# Patient Record
Sex: Female | Born: 1935
Health system: Southern US, Community
[De-identification: ages and names within clinical notes are randomized; demographics above are authoritative.]

## PROBLEM LIST (undated history)

## (undated) DIAGNOSIS — H269 Unspecified cataract: Secondary | ICD-10-CM

## (undated) DIAGNOSIS — I10 Essential (primary) hypertension: Secondary | ICD-10-CM

## (undated) DIAGNOSIS — M19019 Primary osteoarthritis, unspecified shoulder: Secondary | ICD-10-CM

## (undated) DIAGNOSIS — H409 Unspecified glaucoma: Secondary | ICD-10-CM

## (undated) DIAGNOSIS — M62838 Other muscle spasm: Secondary | ICD-10-CM

## (undated) DIAGNOSIS — E7800 Pure hypercholesterolemia, unspecified: Secondary | ICD-10-CM

## (undated) DIAGNOSIS — N289 Disorder of kidney and ureter, unspecified: Secondary | ICD-10-CM

## (undated) DIAGNOSIS — C50919 Malignant neoplasm of unspecified site of unspecified female breast: Secondary | ICD-10-CM

## (undated) DIAGNOSIS — I2510 Atherosclerotic heart disease of native coronary artery without angina pectoris: Secondary | ICD-10-CM

## (undated) DIAGNOSIS — Z720 Tobacco use: Secondary | ICD-10-CM

## (undated) HISTORY — PX: TOTAL ABDOMINAL HYSTERECTOMY: SHX209

## (undated) HISTORY — DX: Other muscle spasm: M62.838

## (undated) HISTORY — PX: CAROTID ENDARTERECTOMY: SUR193

## (undated) HISTORY — DX: Unspecified glaucoma: H40.9

## (undated) HISTORY — DX: Disorder of kidney and ureter, unspecified: N28.9

## (undated) HISTORY — DX: Primary osteoarthritis, unspecified shoulder: M19.019

## (undated) HISTORY — DX: Unspecified cataract: H26.9

## (undated) HISTORY — DX: Tobacco use: Z72.0

## (undated) HISTORY — DX: Essential (primary) hypertension: I10

## (undated) HISTORY — DX: Atherosclerotic heart disease of native coronary artery without angina pectoris: I25.10

## (undated) HISTORY — DX: Pure hypercholesterolemia, unspecified: E78.00

## (undated) HISTORY — DX: Malignant neoplasm of unspecified site of unspecified female breast: C50.919

## (undated) SURGERY — EXCISION MASS
Anesthesia: General | Laterality: Right

---

## 2000-11-29 DIAGNOSIS — C50919 Malignant neoplasm of unspecified site of unspecified female breast: Secondary | ICD-10-CM

## 2000-11-29 HISTORY — PX: MASTECTOMY, RADICAL: SHX710

## 2000-11-29 HISTORY — DX: Malignant neoplasm of unspecified site of unspecified female breast: C50.919

## 2004-11-29 HISTORY — PX: CORONARY ARTERY BYPASS GRAFT: SHX141

## 2010-09-19 ENCOUNTER — Ambulatory Visit: Payer: Self-pay | Admitting: Internal Medicine

## 2012-04-20 DIAGNOSIS — E7800 Pure hypercholesterolemia, unspecified: Secondary | ICD-10-CM | POA: Insufficient documentation

## 2012-05-08 ENCOUNTER — Ambulatory Visit: Payer: Self-pay

## 2012-05-11 ENCOUNTER — Ambulatory Visit: Payer: Self-pay

## 2013-05-15 ENCOUNTER — Ambulatory Visit: Payer: Self-pay | Admitting: Internal Medicine

## 2013-05-15 LAB — HM MAMMOGRAPHY: HM Mammogram: NORMAL

## 2013-07-24 ENCOUNTER — Ambulatory Visit: Payer: Self-pay | Admitting: Internal Medicine

## 2013-07-24 LAB — LIPID PANEL
Cholesterol: 261 mg/dL — AB (ref 0–200)
HDL: 44 mg/dL (ref 35–70)
LDL Cholesterol: 179 mg/dL
Triglycerides: 192 mg/dL — AB (ref 40–160)

## 2014-07-25 ENCOUNTER — Ambulatory Visit: Payer: Self-pay | Admitting: Internal Medicine

## 2014-07-25 LAB — TSH: TSH: 4.1 u[IU]/mL (ref <=5.90)

## 2015-02-04 DIAGNOSIS — Z961 Presence of intraocular lens: Secondary | ICD-10-CM | POA: Diagnosis not present

## 2015-02-04 DIAGNOSIS — H4011X2 Primary open-angle glaucoma, moderate stage: Secondary | ICD-10-CM | POA: Diagnosis not present

## 2015-03-13 DIAGNOSIS — Z720 Tobacco use: Secondary | ICD-10-CM | POA: Diagnosis not present

## 2015-03-13 DIAGNOSIS — N183 Chronic kidney disease, stage 3 (moderate): Secondary | ICD-10-CM | POA: Diagnosis not present

## 2015-03-13 DIAGNOSIS — I25119 Atherosclerotic heart disease of native coronary artery with unspecified angina pectoris: Secondary | ICD-10-CM | POA: Diagnosis not present

## 2015-03-13 DIAGNOSIS — I10 Essential (primary) hypertension: Secondary | ICD-10-CM | POA: Diagnosis not present

## 2015-03-20 DIAGNOSIS — I10 Essential (primary) hypertension: Secondary | ICD-10-CM | POA: Diagnosis not present

## 2015-03-20 DIAGNOSIS — I25110 Atherosclerotic heart disease of native coronary artery with unstable angina pectoris: Secondary | ICD-10-CM | POA: Diagnosis not present

## 2015-05-08 ENCOUNTER — Other Ambulatory Visit: Payer: Self-pay | Admitting: Internal Medicine

## 2015-05-09 ENCOUNTER — Encounter: Payer: Self-pay | Admitting: Internal Medicine

## 2015-05-09 ENCOUNTER — Other Ambulatory Visit: Payer: Self-pay | Admitting: Internal Medicine

## 2015-05-09 DIAGNOSIS — M19019 Primary osteoarthritis, unspecified shoulder: Secondary | ICD-10-CM | POA: Insufficient documentation

## 2015-05-09 DIAGNOSIS — Z853 Personal history of malignant neoplasm of breast: Secondary | ICD-10-CM | POA: Insufficient documentation

## 2015-05-09 DIAGNOSIS — M62838 Other muscle spasm: Secondary | ICD-10-CM | POA: Insufficient documentation

## 2015-05-09 DIAGNOSIS — N289 Disorder of kidney and ureter, unspecified: Secondary | ICD-10-CM | POA: Insufficient documentation

## 2015-05-09 DIAGNOSIS — I10 Essential (primary) hypertension: Secondary | ICD-10-CM | POA: Insufficient documentation

## 2015-05-09 DIAGNOSIS — I2510 Atherosclerotic heart disease of native coronary artery without angina pectoris: Secondary | ICD-10-CM | POA: Insufficient documentation

## 2015-05-09 DIAGNOSIS — Z720 Tobacco use: Secondary | ICD-10-CM | POA: Insufficient documentation

## 2015-06-10 DIAGNOSIS — H4011X2 Primary open-angle glaucoma, moderate stage: Secondary | ICD-10-CM | POA: Diagnosis not present

## 2015-07-29 ENCOUNTER — Encounter: Payer: Self-pay | Admitting: Internal Medicine

## 2015-07-31 ENCOUNTER — Ambulatory Visit (INDEPENDENT_AMBULATORY_CARE_PROVIDER_SITE_OTHER): Payer: Medicare Other | Admitting: Internal Medicine

## 2015-07-31 ENCOUNTER — Encounter: Payer: Self-pay | Admitting: Surgery

## 2015-07-31 ENCOUNTER — Encounter: Payer: Self-pay | Admitting: Internal Medicine

## 2015-07-31 VITALS — BP 152/64 | HR 60 | Ht 60.0 in | Wt 162.2 lb

## 2015-07-31 DIAGNOSIS — N289 Disorder of kidney and ureter, unspecified: Secondary | ICD-10-CM

## 2015-07-31 DIAGNOSIS — E7800 Pure hypercholesterolemia, unspecified: Secondary | ICD-10-CM

## 2015-07-31 DIAGNOSIS — R222 Localized swelling, mass and lump, trunk: Secondary | ICD-10-CM

## 2015-07-31 DIAGNOSIS — I2510 Atherosclerotic heart disease of native coronary artery without angina pectoris: Secondary | ICD-10-CM

## 2015-07-31 DIAGNOSIS — Z853 Personal history of malignant neoplasm of breast: Secondary | ICD-10-CM | POA: Diagnosis not present

## 2015-07-31 DIAGNOSIS — I10 Essential (primary) hypertension: Secondary | ICD-10-CM

## 2015-07-31 DIAGNOSIS — Z0000 Encounter for general adult medical examination without abnormal findings: Secondary | ICD-10-CM | POA: Diagnosis not present

## 2015-07-31 LAB — POC URINALYSIS WITH MICROSCOPIC (NON AUTO)MANUAL RESULT
BILIRUBIN UA: NEGATIVE
Crystals: 0
Epithelial cells, urine per micros: 5
GLUCOSE UA: NEGATIVE
Ketones, UA: NEGATIVE
Mucus, UA: 0
NITRITE UA: NEGATIVE
Protein, UA: NEGATIVE
RBC UA: NEGATIVE
RBC: 0 M/uL — AB (ref 4.04–5.48)
SPEC GRAV UA: 1.01
UROBILINOGEN UA: 0.2
WBC Casts, UA: 0
pH, UA: 5

## 2015-07-31 NOTE — Patient Instructions (Signed)
Health Maintenance  Topic Date Due  . TETANUS/TDAP  01/16/1946  . COLONOSCOPY  01/16/1977  . ZOSTAVAX  01/16/1987  . DEXA SCAN  01/17/1992  . PNA vac Low Risk Adult (2 of 2 - PCV13) 11/30/2005  . INFLUENZA VACCINE  06/30/2015

## 2015-07-31 NOTE — Progress Notes (Signed)
Patient: Donna Santos, Female    DOB: 24-Jan-1925, 81 y.o.   MRN: [id number] Visit Date: 07/31/2015  Today's Provider: Halina Maidens, MD   Chief Complaint  Patient presents with  . Medicare Wellness  . Hypertension  . Hyperlipidemia  . Chronic Kidney Disease   Subjective:    Annual wellness visit Donna Santos is a 81 y.o. female who presents today for her Subsequent Annual Wellness Visit. She feels well. She reports exercising rarely. She reports she is sleeping fairly well.   ----------------------------------------------------------- Hypertension This is a chronic problem. The current episode started more than 1 year ago. The problem is unchanged. The problem is controlled. Pertinent negatives include no anxiety, chest pain, headaches, neck pain, palpitations (gerd) or shortness of breath. There are no associated agents to hypertension. Risk factors for coronary artery disease include dyslipidemia. Hypertensive end-organ damage includes angina, kidney disease and CAD/MI. There is no history of CVA or PVD.   Coronary artery disease - patient reports no chest pains or shortness of breath. She continues on daily aspirin. She remains off of statins due to reluctance for treatment.  Renal insufficiency-patient has mild decrease in renal function. This has been stable. She denies shortness of breath significant lower extremity edema or orthopnea.  Hyperlipidemia patient's had elevated lipids but has not been treated with statin therapy. She continues to decline therapy at this time.  History of breast cancer-patient had right mastectomy in 2002. She reports finding a mass on the right chest wall, she is uncertain how long it has been there and it seems to be changing. It's mildly uncomfortable. Her last mammogram was in 2014 and was normal.  Reflux-patient reports reflux symptoms if she drinks too much coffee. Otherwise she denies heartburn daily trouble swallowing, change  in bowel habits, or blood in stools. She does not take any reflux medications. She's been encouraged to drink more water or juices.  Review of Systems  Constitutional: Negative for fever and appetite change.  HENT: Negative for hearing loss, nosebleeds, sinus pressure, trouble swallowing and voice change.   Eyes: Negative for visual disturbance.  Respiratory: Negative for cough, chest tightness and shortness of breath.   Cardiovascular: Negative for chest pain and palpitations (gerd).  Gastrointestinal: Positive for abdominal pain. Negative for diarrhea, constipation and blood in stool.  Genitourinary: Positive for decreased urine volume. Negative for dysuria, urgency, hematuria and pelvic pain.  Musculoskeletal: Negative for arthralgias, gait problem and neck pain.  Allergic/Immunologic: Negative for food allergies.  Neurological: Negative for dizziness and headaches.  Psychiatric/Behavioral: Negative for decreased concentration.    Social History   Social History  . Marital Status: Widowed    Spouse Name: N/A  . Number of Children: N/A  . Years of Education: N/A   Occupational History  . Not on file.   Social History Main Topics  . Smoking status: Former Research scientist (life sciences)  . Smokeless tobacco: Not on file  . Alcohol Use: No  . Drug Use: Not on file  . Sexual Activity: Not on file   Other Topics Concern  . Not on file   Social History Narrative    Patient Active Problem List   Diagnosis Date Noted  . Impaired renal function 05/09/2015  . Arteriosclerosis of coronary artery 05/09/2015  . Essential (primary) hypertension 05/09/2015  . Personal history of malignant neoplasm of breast 05/09/2015  . Muscle spasms of neck 05/09/2015  . Arthritis of shoulder region, degenerative 05/09/2015  . Current tobacco use 05/09/2015  . Hypercholesteremia 04/20/2012  Past Surgical History  Procedure Laterality Date  . Coronary artery bypass graft  2006  . Mastectomy, radical Right 2002    . Total abdominal hysterectomy    . Carotid endarterectomy      Her family history is not on file.    Previous Medications   AMLODIPINE (NORVASC) 5 MG TABLET    Take 1 tablet by mouth daily.   ASPIRIN 81 MG TABLET    Take 1 tablet by mouth daily.   CHLORTHALIDONE (HYGROTON) 25 MG TABLET    Take 1 tablet by mouth daily.   LATANOPROST (XALATAN) 0.005 % OPHTHALMIC SOLUTION    INT 1 GTT INTO EACH EYE QHS.   LOSARTAN (COZAAR) 25 MG TABLET    TAKE 1 TABLET BY MOUTH EVERY DAY   METOPROLOL (LOPRESSOR) 100 MG TABLET    Take 1 tablet by mouth 2 (two) times daily.   NITROGLYCERIN (NITROSTAT) 0.4 MG SL TABLET    Frequency:PHARMDIR   Dosage:0.0     Instructions:  Note:Q5 MIN  prn, may repeat x 3  prn; disp 1 bottle Dose: 1 TAB   TIMOLOL (TIMOPTIC) 0.25 % OPHTHALMIC SOLUTION    INSTILL ONE DROP INTO BOTH EYES EVERY MORNING.    Patient Care Team: Glean Hess, MD as PCP - General (Family Medicine)     Objective:   Vitals: BP 152/64 mmHg  Pulse 60  Ht 5' (1.524 m)  Wt 162 lb 3.2 oz (73.573 kg)  BMI 31.68 kg/m2  Physical Exam  Constitutional: She is oriented to person, place, and time. She appears well-developed. No distress.  HENT:  Head: Normocephalic and atraumatic.  Eyes: Conjunctivae are normal. Right eye exhibits no discharge. Left eye exhibits no discharge. No scleral icterus.  Neck: No tracheal tenderness present. Carotid bruit is not present. No thyromegaly present.  Cardiovascular: Normal rate, regular rhythm and S1 normal.  Exam reveals decreased pulses.   Pulmonary/Chest: Effort normal and breath sounds normal. No respiratory distress. Left breast exhibits no mass, no nipple discharge, no skin change and no tenderness.    Abdominal: Soft. Normal appearance and bowel sounds are normal. There is no hepatosplenomegaly. There is no tenderness. There is no CVA tenderness.  Musculoskeletal: Normal range of motion.       Right knee: She exhibits effusion.       Left knee: She  exhibits effusion.  Lymphadenopathy:    She has no cervical adenopathy.    She has no axillary adenopathy.  Neurological: She is alert and oriented to person, place, and time. She has normal reflexes.  Skin: Skin is warm, dry and intact. No rash noted.  Psychiatric: She has a normal mood and affect. Her behavior is normal. Judgment and thought content normal. Cognition and memory are normal.    Activities of Daily Living In your present state of health, do you have any difficulty performing the following activities: 07/31/2015  Hearing? N  Vision? N  Difficulty concentrating or making decisions? Y  Walking or climbing stairs? N  Dressing or bathing? N  Doing errands, shopping? N    Fall Risk Assessment Fall Risk  07/31/2015  Falls in the past year? No     Patient reports there are safety devices in place in shower at home.   Depression Screen PHQ 2/9 Scores 07/31/2015  PHQ - 2 Score 0    Cognitive Testing - 6-CIT   Correct? Score   What year is it? yes 0 Yes = 0    No =  4  What month is it? yes 0 Yes = 0    No = 3  Remember:     Pia Mau, [place], [place]     What time is it? yes 0 Yes = 0    No = 3  Count backwards from 20 to 1 yes 0 Correct = 0    1 error = 2   More than 1 error = 4  Say the months of the year in reverse. yes 0 Correct = 0    1 error = 2   More than 1 error = 4  What address did I ask you to remember? yes 0 Correct = 0  1 error = 2    2 error = 4    3 error = 6    4 error = 8    All wrong = 10       TOTAL SCORE  0/28   Interpretation:  Normal  Normal (0-7) Abnormal (8-28)        Assessment & Plan:     Annual Wellness Visit  Reviewed patient's Family Medical History Reviewed and updated list of patient's medical providers Assessment of cognitive impairment was done Assessed patient's functional ability Established a written schedule for health screening Mar-Mac Completed and Reviewed  Exercise Activities and  Dietary recommendations Goals    None      Immunization History  Administered Date(s) Administered  . Pneumococcal Polysaccharide-23 11/30/2004    Health Maintenance  Topic Date Due  . TETANUS/TDAP  01/16/1946  . COLONOSCOPY  01/16/1977  . ZOSTAVAX  01/16/1987  . DEXA SCAN  01/17/1992  . PNA vac Low Risk Adult (2 of 2 - PCV13) 11/30/2005  . INFLUENZA VACCINE  06/30/2015     Discussed health benefits of physical activity, and encouraged her to engage in regular exercise appropriate for her age and condition.    ------------------------------------------------------------------------------------------------------------   1. Annual physical exam Patient is up-to-date on immunizations. She continues to decline colonoscopy. Medicare wellness measures satisfied today.  2. Essential (primary) hypertension Well-controlled on current medications. - CBC with Differential/Platelet - POCT urinalysis dipstick  3. Impaired renal function We'll check labs today. - Comprehensive metabolic panel  4. Hypercholesteremia Will not obtain lipid panel as patient declines statin therapy. - TSH  5. Personal history of malignant neoplasm of breast Refer for unilateral left mammogram and chest x-ray. - MM Digital Screening Unilat L; Future - DG Chest 2 View; Future  6. Chest wall mass Refer to general surgery for evaluation and possible excision of chest wall mass. - Ambulatory referral to General Surgery  7. Atherosclerosis of coronary artery Continue aspirin daily. Follow-up with cardiology as needed.  Halina Maidens, MD [facility]  07/31/2015

## 2015-08-01 LAB — CBC WITH DIFFERENTIAL/PLATELET
BASOS ABS: 0.1 10*3/uL (ref 0.0–0.2)
BASOS: 1 %
EOS (ABSOLUTE): 0.2 10*3/uL (ref 0.0–0.4)
Eos: 3 %
HEMOGLOBIN: 14.2 g/dL (ref 11.1–15.9)
Hematocrit: 42.6 % (ref 34.0–46.6)
IMMATURE GRANS (ABS): 0 10*3/uL (ref 0.0–0.1)
IMMATURE GRANULOCYTES: 0 %
LYMPHS: 20 %
Lymphocytes Absolute: 1.6 10*3/uL (ref 0.7–3.1)
MCH: 31.3 pg (ref 26.6–33.0)
MCHC: 33.3 g/dL (ref 31.5–35.7)
MCV: 94 fL (ref 79–97)
MONOCYTES: 8 %
Monocytes Absolute: 0.7 10*3/uL (ref 0.1–0.9)
NEUTROS ABS: 5.4 10*3/uL (ref 1.4–7.0)
Neutrophils: 68 %
Platelets: 403 10*3/uL — ABNORMAL HIGH (ref 150–379)
RBC: 4.54 x10E6/uL (ref 3.77–5.28)
RDW: 13.7 % (ref 12.3–15.4)
WBC: 8.1 10*3/uL (ref 3.4–10.8)

## 2015-08-01 LAB — COMPREHENSIVE METABOLIC PANEL
ALBUMIN: 4.5 g/dL (ref 3.5–4.7)
ALT: 12 IU/L (ref 0–32)
AST: 15 IU/L (ref 0–40)
Albumin/Globulin Ratio: 1.6 (ref 1.1–2.5)
Alkaline Phosphatase: 44 IU/L (ref 39–117)
BILIRUBIN TOTAL: 0.4 mg/dL (ref 0.0–1.2)
BUN / CREAT RATIO: 16 (ref 11–26)
BUN: 18 mg/dL (ref 8–27)
CALCIUM: 9.5 mg/dL (ref 8.7–10.3)
CHLORIDE: 96 mmol/L — AB (ref 97–108)
CO2: 22 mmol/L (ref 18–29)
Creatinine, Ser: 1.11 mg/dL — ABNORMAL HIGH (ref 0.57–1.00)
GFR, EST AFRICAN AMERICAN: 51 mL/min/{1.73_m2} — AB (ref >59)
GFR, EST NON AFRICAN AMERICAN: 44 mL/min/{1.73_m2} — AB (ref >59)
GLUCOSE: 124 mg/dL — AB (ref 65–99)
Globulin, Total: 2.9 g/dL (ref 1.5–4.5)
Potassium: 4.2 mmol/L (ref 3.5–5.2)
Sodium: 138 mmol/L (ref 134–144)
TOTAL PROTEIN: 7.4 g/dL (ref 6.0–8.5)

## 2015-08-01 LAB — TSH: TSH: 2.41 u[IU]/mL (ref 0.450–4.500)

## 2015-08-05 ENCOUNTER — Other Ambulatory Visit: Payer: Self-pay | Admitting: Internal Medicine

## 2015-08-08 ENCOUNTER — Encounter: Payer: Self-pay | Admitting: *Deleted

## 2015-08-11 ENCOUNTER — Ambulatory Visit (INDEPENDENT_AMBULATORY_CARE_PROVIDER_SITE_OTHER): Payer: Medicare Other | Admitting: Surgery

## 2015-08-11 ENCOUNTER — Encounter (INDEPENDENT_AMBULATORY_CARE_PROVIDER_SITE_OTHER): Payer: Self-pay

## 2015-08-11 ENCOUNTER — Encounter: Payer: Self-pay | Admitting: Surgery

## 2015-08-11 VITALS — BP 160/80 | HR 70 | Temp 97.8°F | Ht 60.0 in | Wt 160.0 lb

## 2015-08-11 DIAGNOSIS — R222 Localized swelling, mass and lump, trunk: Secondary | ICD-10-CM | POA: Diagnosis not present

## 2015-08-11 NOTE — Progress Notes (Signed)
  Surgical Consultation  08/11/2015  Donna Santos is an 81 y.o. female.   CC: Right chest wall mass  HPI: This patient with a personal history of breast cancer is had a mastectomy on the right presents with a chest wall mass which has been enlarging and causing her some pain but it has been present for many years unclear as to how many years denies weight loss fevers or chills.  Past Medical History  Diagnosis Date  . Impaired renal function   . Arteriosclerosis of coronary artery   . Essential hypertension   . Malignant neoplasm of breast   . Muscle spasms of neck   . Arthritis of shoulder region   . Tobacco abuse   . Hypercholesteremia     Past Surgical History  Procedure Laterality Date  . Coronary artery bypass graft  2006  . Mastectomy, radical Right 2002  . Total abdominal hysterectomy    . Carotid endarterectomy      Family History  Problem Relation Age of Onset  . Hypertension Father   . Cancer Father     Social History:  reports that she has quit smoking. She has never used smokeless tobacco. She reports that she does not drink alcohol or use illicit drugs.  Allergies:  Allergies  Allergen Reactions  . Ace Inhibitors     Other reaction(s): Angioedema    Medications reviewed.   Review of Systems:   Review of Systems  Constitutional: Negative for fever, chills and weight loss.  HENT: Negative.   Eyes: Negative.   Respiratory: Negative.   Cardiovascular: Negative.   Gastrointestinal: Negative.   Genitourinary: Negative.   Musculoskeletal: Negative.   Skin: Negative.   Neurological: Negative.  Negative for weakness.  Endo/Heme/Allergies: Negative.   Psychiatric/Behavioral: Negative.      Physical Exam:  BP 160/80 mmHg  Pulse 70  Temp(Src) 97.8 F (36.6 C) (Oral)  Ht 5' (1.524 m)  Wt 160 lb (72.576 kg)  BMI 31.25 kg/m2  Physical Exam  Constitutional: She is well-developed, well-nourished, and in no distress. No distress.    HENT:  Head: Normocephalic and atraumatic.  Eyes: No scleral icterus.  Neck: Normal range of motion. Neck supple.  Cardiovascular: Normal rate, regular rhythm and normal heart sounds.   Pulmonary/Chest: Effort normal and breath sounds normal. No respiratory distress. She has no wheezes.    Median sternotomy incision is healed. Right MRM site is well healed. There is a 2-1/2-3 cm mobile mass in the subcutaneous tissue not fixed to the chest wall in the medial portion of the mastectomy site. No overlying erythema or drainage.  Abdominal: Soft. She exhibits no distension. There is no tenderness.  Lymphadenopathy:    She has no cervical adenopathy.  Skin: Skin is warm and dry.      No results found for this or any previous visit (from the past 48 hour(s)). No results found.  Assessment/Plan:  Long-standing right chest wall mass that seems to be in the subcutaneous tissues tissues does not seem to be fixed but is is in the mastectomy site is been present for many years. I recommend CT scan of the chest and chest wall at this point sitter excisional biopsy in the near future.  Florene Glen, MD, FACS

## 2015-08-11 NOTE — Patient Instructions (Signed)
Have CT scan of chest performed and follow-up with Dr. Burt Knack in 10-14 days

## 2015-08-13 ENCOUNTER — Other Ambulatory Visit: Payer: Self-pay | Admitting: *Deleted

## 2015-08-13 DIAGNOSIS — R222 Localized swelling, mass and lump, trunk: Secondary | ICD-10-CM

## 2015-08-14 ENCOUNTER — Telehealth: Payer: Self-pay

## 2015-08-14 NOTE — Telephone Encounter (Signed)
Melanie from [facility] called in reference to patient's CT order. She stated that patient had an order for CT Chest with and without contrast. However, she needs patient's CT to be changed to CT Chest with contrast. Can you please call patient's insurance and obtain a new authorization. Thank you.   Threasa Beards would like for you to call her back once you had obtain authorization at [phone]. Thanks.

## 2015-08-15 ENCOUNTER — Ambulatory Visit
Admission: RE | Admit: 2015-08-15 | Discharge: 2015-08-15 | Disposition: A | Discharge status: Home / Self Care | Payer: Medicare Other | Source: Ambulatory Visit | Attending: Surgery | Admitting: Surgery

## 2015-08-15 ENCOUNTER — Telehealth: Payer: Self-pay

## 2015-08-15 DIAGNOSIS — R222 Localized swelling, mass and lump, trunk: Secondary | ICD-10-CM

## 2015-08-15 NOTE — Telephone Encounter (Signed)
A new authorization has been obtained for CPT: 22025. AUTH# [id number]. I have contacted the pre-cert department with this updated information.

## 2015-08-15 NOTE — Telephone Encounter (Signed)
Heather from [place] at [facility] called at this time. Explains that multiple people have tried to obtain IV access for contrast and they cannot put line in.  Spoke with Dr. Azalee Course regarding this situation and she would like patient to be rescheduled for CT at [facility] so that their are other options for who may place line if their is difficulty and also to have patient drink as much as possible the day of CT to be as hydrated as possible as this scan definitely needs to be done with contrast.  Explained this to [facility] and she will take care of giving patient the instructions and getting her rescheduled.

## 2015-08-18 ENCOUNTER — Telehealth: Payer: Self-pay | Admitting: Surgery

## 2015-08-18 NOTE — Telephone Encounter (Signed)
Called patient to let her know that Nira Conn from [place] [place] Department was going to contact her in reference to rescheduling her CT Scan. I also told patient that Dr. Azalee Course really wanted her to have this done. Patient understood.

## 2015-08-18 NOTE — Telephone Encounter (Signed)
Patient has an appointment to get results on a CAT Scan but she didn't have it because they couldn't find a vein. She would like to know is it really necessary that she have this?

## 2015-08-19 ENCOUNTER — Other Ambulatory Visit: Payer: Self-pay

## 2015-08-19 DIAGNOSIS — R222 Localized swelling, mass and lump, trunk: Secondary | ICD-10-CM

## 2015-08-21 ENCOUNTER — Ambulatory Visit
Admission: RE | Admit: 2015-08-21 | Discharge: 2015-08-21 | Disposition: A | Discharge status: Home / Self Care | Payer: Medicare Other | Source: Ambulatory Visit | Attending: Surgery | Admitting: Surgery

## 2015-08-21 DIAGNOSIS — R222 Localized swelling, mass and lump, trunk: Secondary | ICD-10-CM | POA: Diagnosis present

## 2015-08-21 DIAGNOSIS — R918 Other nonspecific abnormal finding of lung field: Secondary | ICD-10-CM | POA: Insufficient documentation

## 2015-08-21 DIAGNOSIS — I517 Cardiomegaly: Secondary | ICD-10-CM | POA: Insufficient documentation

## 2015-08-21 DIAGNOSIS — K449 Diaphragmatic hernia without obstruction or gangrene: Secondary | ICD-10-CM | POA: Insufficient documentation

## 2015-08-21 DIAGNOSIS — R911 Solitary pulmonary nodule: Secondary | ICD-10-CM | POA: Diagnosis not present

## 2015-08-21 DIAGNOSIS — N281 Cyst of kidney, acquired: Secondary | ICD-10-CM | POA: Insufficient documentation

## 2015-08-21 LAB — POCT I-STAT CREATININE: CREATININE: 1 mg/dL (ref 0.44–1.00)

## 2015-08-21 MED ORDER — IOHEXOL 300 MG/ML  SOLN
60.0000 mL | Freq: Once | INTRAMUSCULAR | Status: AC | PRN
  Administered 2015-08-21: 60 mL via INTRAVENOUS

## 2015-08-26 ENCOUNTER — Encounter: Payer: Self-pay | Admitting: Surgery

## 2015-08-26 ENCOUNTER — Ambulatory Visit (INDEPENDENT_AMBULATORY_CARE_PROVIDER_SITE_OTHER): Payer: Medicare Other | Admitting: Surgery

## 2015-08-26 VITALS — BP 195/68 | HR 62 | Temp 98.3°F | Ht 60.0 in | Wt 160.0 lb

## 2015-08-26 DIAGNOSIS — R222 Localized swelling, mass and lump, trunk: Secondary | ICD-10-CM | POA: Diagnosis not present

## 2015-08-26 NOTE — Progress Notes (Signed)
Outpatient Surgical Follow Up  08/26/2015  Donna Santos is an 81 y.o. female.   CC right chest wall mass  HPI: Patient has a right chest wall mass which is persistent and occasionally painful and growing in size. She has a personal history of right breast cancer and has had a mastectomy 20 years ago  Past Medical History  Diagnosis Date  . Impaired renal function   . Arteriosclerosis of coronary artery   . Essential hypertension   . Malignant neoplasm of breast   . Muscle spasms of neck   . Arthritis of shoulder region   . Tobacco abuse   . Hypercholesteremia     Past Surgical History  Procedure Laterality Date  . Coronary artery bypass graft  2006  . Mastectomy, radical Right 2002  . Total abdominal hysterectomy    . Carotid endarterectomy      Family History  Problem Relation Age of Onset  . Hypertension Father   . Cancer Father     Social History:  reports that she has quit smoking. She has never used smokeless tobacco. She reports that she does not drink alcohol or use illicit drugs.  Allergies:  Allergies  Allergen Reactions  . Ace Inhibitors     Other reaction(s): Angioedema    Medications reviewed.   Review of Systems:   Review of Systems  Constitutional: Negative.   HENT: Negative.   Eyes: Negative.   Respiratory: Negative.   Cardiovascular: Negative.   Gastrointestinal: Negative.   Genitourinary: Negative.   Musculoskeletal: Negative.   Skin: Negative.   Neurological: Negative.   Endo/Heme/Allergies: Negative.   Psychiatric/Behavioral: Negative.      Physical Exam:  BP 195/68 mmHg  Pulse 62  Temp(Src) 98.3 F (36.8 C) (Oral)  Ht 5' (1.524 m)  Wt 160 lb (72.576 kg)  BMI 31.25 kg/m2  Physical Exam  Constitutional: She is oriented to person, place, and time and well-developed, well-nourished, and in no distress.  HENT:  Head: Normocephalic and atraumatic.  Eyes: No scleral icterus.  Cardiovascular: Normal rate and regular  rhythm.   Pulmonary/Chest: No respiratory distress. She has no wheezes. She has no rales.    Abdominal: Soft. She exhibits no distension. There is no tenderness.  Lymphadenopathy:    She has no cervical adenopathy.  Neurological: She is alert and oriented to person, place, and time.  Skin: Skin is warm and dry.  Psychiatric: Affect normal.      No results found for this or any previous visit (from the past 48 hour(s)). No results found.  Assessment/Plan:  CT scan is personally reviewed and of note there is no mention in the report of the chest wall mass for which she was sent. I note that this is palpable on my exam measuring at approximately 2-1/2 cm medially on the chest wall beneath the mastectomy flap. A CT scan there appears to be a mass in this area somewhat deep to the pectoralis major.  I'm recommending excisional biopsy. I discussed with she and her family the options of observation and the rationale for offering surgery also the risks of bleeding infection recurrence and the need for further therapy including surgery or radiation therapy and cosmetic deformity they understood and agreed to proceed.  I tried to schedule this for 2 days from now on the patient's family has other commitments we will try to schedule it at their convenience yet of this week or next    Florene Glen, MD, FACS

## 2015-08-26 NOTE — Patient Instructions (Signed)
Please give [place] a call once you've decided when you want surgery to be scheduled for.

## 2015-08-27 ENCOUNTER — Telehealth: Payer: Self-pay | Admitting: Surgery

## 2015-08-27 ENCOUNTER — Encounter
Admission: RE | Admit: 2015-08-27 | Discharge: 2015-08-27 | Disposition: A | Discharge status: Home / Self Care | Payer: Medicare Other | Source: Ambulatory Visit | Attending: Surgery | Admitting: Surgery

## 2015-08-27 DIAGNOSIS — M1990 Unspecified osteoarthritis, unspecified site: Secondary | ICD-10-CM | POA: Diagnosis not present

## 2015-08-27 DIAGNOSIS — M62838 Other muscle spasm: Secondary | ICD-10-CM | POA: Diagnosis not present

## 2015-08-27 DIAGNOSIS — E780 Pure hypercholesterolemia: Secondary | ICD-10-CM | POA: Diagnosis not present

## 2015-08-27 DIAGNOSIS — Z809 Family history of malignant neoplasm, unspecified: Secondary | ICD-10-CM | POA: Diagnosis not present

## 2015-08-27 DIAGNOSIS — Z87891 Personal history of nicotine dependence: Secondary | ICD-10-CM | POA: Diagnosis not present

## 2015-08-27 DIAGNOSIS — Z90710 Acquired absence of both cervix and uterus: Secondary | ICD-10-CM | POA: Diagnosis not present

## 2015-08-27 DIAGNOSIS — Z8249 Family history of ischemic heart disease and other diseases of the circulatory system: Secondary | ICD-10-CM | POA: Diagnosis not present

## 2015-08-27 DIAGNOSIS — Z853 Personal history of malignant neoplasm of breast: Secondary | ICD-10-CM | POA: Diagnosis not present

## 2015-08-27 DIAGNOSIS — Z951 Presence of aortocoronary bypass graft: Secondary | ICD-10-CM | POA: Diagnosis not present

## 2015-08-27 DIAGNOSIS — C50911 Malignant neoplasm of unspecified site of right female breast: Secondary | ICD-10-CM | POA: Diagnosis not present

## 2015-08-27 DIAGNOSIS — R222 Localized swelling, mass and lump, trunk: Secondary | ICD-10-CM | POA: Diagnosis present

## 2015-08-27 DIAGNOSIS — Z888 Allergy status to other drugs, medicaments and biological substances status: Secondary | ICD-10-CM | POA: Diagnosis not present

## 2015-08-27 DIAGNOSIS — I2510 Atherosclerotic heart disease of native coronary artery without angina pectoris: Secondary | ICD-10-CM | POA: Diagnosis not present

## 2015-08-27 DIAGNOSIS — Z171 Estrogen receptor negative status [ER-]: Secondary | ICD-10-CM | POA: Diagnosis not present

## 2015-08-27 DIAGNOSIS — N289 Disorder of kidney and ureter, unspecified: Secondary | ICD-10-CM | POA: Diagnosis not present

## 2015-08-27 LAB — CBC WITH DIFFERENTIAL/PLATELET
Basophils Absolute: 0.1 10*3/uL (ref 0–0.1)
Basophils Relative: 1 %
EOS PCT: 2 %
Eosinophils Absolute: 0.2 10*3/uL (ref 0–0.7)
HEMATOCRIT: 43.3 % (ref 35.0–47.0)
Hemoglobin: 14.7 g/dL (ref 12.0–16.0)
LYMPHS PCT: 13 %
Lymphs Abs: 1.4 10*3/uL (ref 1.0–3.6)
MCH: 31.2 pg (ref 26.0–34.0)
MCHC: 34 g/dL (ref 32.0–36.0)
MCV: 91.7 fL (ref 80.0–100.0)
MONO ABS: 1 10*3/uL — AB (ref 0.2–0.9)
MONOS PCT: 9 %
NEUTROS ABS: 8 10*3/uL — AB (ref 1.4–6.5)
Neutrophils Relative %: 75 %
PLATELETS: 357 10*3/uL (ref 150–440)
RBC: 4.72 MIL/uL (ref 3.80–5.20)
RDW: 13.8 % (ref 11.5–14.5)
WBC: 10.6 10*3/uL (ref 3.6–11.0)

## 2015-08-27 LAB — BASIC METABOLIC PANEL
ANION GAP: 11 (ref 5–15)
BUN: 18 mg/dL (ref 6–20)
CO2: 28 mmol/L (ref 22–32)
Calcium: 9.5 mg/dL (ref 8.9–10.3)
Chloride: 95 mmol/L — ABNORMAL LOW (ref 101–111)
Creatinine, Ser: 1.01 mg/dL — ABNORMAL HIGH (ref 0.44–1.00)
GFR calc Af Amer: 56 mL/min — ABNORMAL LOW (ref >60)
GFR calc non Af Amer: 48 mL/min — ABNORMAL LOW (ref >60)
GLUCOSE: 119 mg/dL — AB (ref 65–99)
POTASSIUM: 3.2 mmol/L — AB (ref 3.5–5.1)
Sodium: 134 mmol/L — ABNORMAL LOW (ref 135–145)

## 2015-08-27 NOTE — OR Nursing (Signed)
Notified Angie at Dr. York Cerise office of patients 3.2 potassium

## 2015-08-27 NOTE — Telephone Encounter (Signed)
Pt was advised of her pre op date/time and sx date in the office. Sx: 08/28/15 with Dr Charm Barges Chest wall biopsy Pre op: 08/27/15 @ 12:15--office.

## 2015-08-27 NOTE — Patient Instructions (Signed)
  Your procedure is scheduled on: Thursday 08/28/15 Report to Day Surgery. 2ND FLOOR MEDICAL MALL ENTRANCE AT 7:30   Remember: Instructions that are not followed completely may result in serious medical risk, up to and including death, or upon the discretion of your surgeon and anesthesiologist your surgery may need to be rescheduled.    __X__ 1. Do not eat food or drink liquids after midnight. No gum chewing or hard candies.     __X__ 2. No Alcohol for 24 hours before or after surgery.   ____ 3. Bring all medications with you on the day of surgery if instructed.    __X__ 4. Notify your doctor if there is any change in your medical condition     (cold, fever, infections).     Do not wear jewelry, make-up, hairpins, clips or nail polish.  Do not wear lotions, powders, or perfumes.   Do not shave 48 hours prior to surgery. Men may shave face and neck.  Do not bring valuables to the hospital.    [facility] is not responsible for any belongings or valuables.               Contacts, dentures or bridgework may not be worn into surgery.  Leave your suitcase in the car. After surgery it may be brought to your room.  For patients admitted to the hospital, discharge time is determined by your                treatment team.   Patients discharged the day of surgery will not be allowed to drive home.   Please read over the following fact sheets that you were given:   Surgical Site Infection Prevention   __X__ Take these medicines the morning of surgery with A SIP OF WATER:    1. AMLODIPINE  2. LOSARTAN  3. METOPROLOL  4.  5.  6.  ____ Fleet Enema (as directed)   __X__ Use CHG Soap as directed  ____ Use inhalers on the day of surgery  ____ Stop metformin 2 days prior to surgery    ____ Take 1/2 of usual insulin dose the night before surgery and none on the morning of surgery.   ____ Stop Coumadin/Plavix/aspirin on LAST DOSE 08/26/15  ____ Stop Anti-inflammatories on    ____ Stop  supplements until after surgery.    ____ Bring C-Pap to the hospital.

## 2015-08-28 ENCOUNTER — Ambulatory Visit: Payer: Medicare Other | Admitting: Anesthesiology

## 2015-08-28 ENCOUNTER — Ambulatory Visit
Admission: RE | Admit: 2015-08-28 | Discharge: 2015-08-28 | Disposition: A | Discharge status: Home / Self Care | Payer: Medicare Other | Source: Ambulatory Visit | Attending: Surgery | Admitting: Surgery

## 2015-08-28 ENCOUNTER — Encounter
Admission: RE | Disposition: A | Discharge status: Home / Self Care | Payer: Self-pay | Source: Ambulatory Visit | Attending: Surgery

## 2015-08-28 ENCOUNTER — Encounter: Payer: Self-pay | Admitting: *Deleted

## 2015-08-28 DIAGNOSIS — Z951 Presence of aortocoronary bypass graft: Secondary | ICD-10-CM | POA: Diagnosis not present

## 2015-08-28 DIAGNOSIS — Z853 Personal history of malignant neoplasm of breast: Secondary | ICD-10-CM | POA: Diagnosis not present

## 2015-08-28 DIAGNOSIS — Z87891 Personal history of nicotine dependence: Secondary | ICD-10-CM | POA: Diagnosis not present

## 2015-08-28 DIAGNOSIS — Z171 Estrogen receptor negative status [ER-]: Secondary | ICD-10-CM | POA: Insufficient documentation

## 2015-08-28 DIAGNOSIS — Z809 Family history of malignant neoplasm, unspecified: Secondary | ICD-10-CM | POA: Diagnosis not present

## 2015-08-28 DIAGNOSIS — C50911 Malignant neoplasm of unspecified site of right female breast: Secondary | ICD-10-CM | POA: Diagnosis not present

## 2015-08-28 DIAGNOSIS — C7989 Secondary malignant neoplasm of other specified sites: Secondary | ICD-10-CM | POA: Diagnosis not present

## 2015-08-28 DIAGNOSIS — R222 Localized swelling, mass and lump, trunk: Secondary | ICD-10-CM

## 2015-08-28 DIAGNOSIS — Z8249 Family history of ischemic heart disease and other diseases of the circulatory system: Secondary | ICD-10-CM | POA: Diagnosis not present

## 2015-08-28 DIAGNOSIS — Z888 Allergy status to other drugs, medicaments and biological substances status: Secondary | ICD-10-CM | POA: Insufficient documentation

## 2015-08-28 DIAGNOSIS — E780 Pure hypercholesterolemia: Secondary | ICD-10-CM | POA: Diagnosis not present

## 2015-08-28 DIAGNOSIS — M62838 Other muscle spasm: Secondary | ICD-10-CM | POA: Insufficient documentation

## 2015-08-28 DIAGNOSIS — M1990 Unspecified osteoarthritis, unspecified site: Secondary | ICD-10-CM | POA: Insufficient documentation

## 2015-08-28 DIAGNOSIS — N289 Disorder of kidney and ureter, unspecified: Secondary | ICD-10-CM | POA: Insufficient documentation

## 2015-08-28 DIAGNOSIS — I2510 Atherosclerotic heart disease of native coronary artery without angina pectoris: Secondary | ICD-10-CM | POA: Diagnosis not present

## 2015-08-28 DIAGNOSIS — N63 Unspecified lump in breast: Secondary | ICD-10-CM | POA: Diagnosis not present

## 2015-08-28 DIAGNOSIS — I10 Essential (primary) hypertension: Secondary | ICD-10-CM | POA: Diagnosis not present

## 2015-08-28 DIAGNOSIS — Z90710 Acquired absence of both cervix and uterus: Secondary | ICD-10-CM | POA: Insufficient documentation

## 2015-08-28 HISTORY — PX: MASS EXCISION: SHX2000

## 2015-08-28 LAB — POCT I-STAT 4, (NA,K, GLUC, HGB,HCT)
Glucose, Bld: 131 mg/dL — ABNORMAL HIGH (ref 65–99)
HEMATOCRIT: 43 % (ref 36.0–46.0)
HEMOGLOBIN: 14.6 g/dL (ref 12.0–15.0)
Potassium: 3.6 mmol/L (ref 3.5–5.1)
SODIUM: 137 mmol/L (ref 135–145)

## 2015-08-28 SURGERY — EXCISION MASS
Anesthesia: General | Laterality: Right | Wound class: Clean

## 2015-08-28 MED ORDER — EPHEDRINE SULFATE 50 MG/ML IJ SOLN
INTRAMUSCULAR | Status: DC | PRN
  Administered 2015-08-28: 15 mg via INTRAVENOUS

## 2015-08-28 MED ORDER — HEPARIN SODIUM (PORCINE) 5000 UNIT/ML IJ SOLN
INTRAMUSCULAR | Status: AC
  Filled 2015-08-28: qty 1

## 2015-08-28 MED ORDER — PROPOFOL 10 MG/ML IV BOLUS
INTRAVENOUS | Status: DC | PRN
Start: 2015-08-28 — End: 2015-08-28
  Administered 2015-08-28: 130 mg via INTRAVENOUS

## 2015-08-28 MED ORDER — ONDANSETRON HCL 4 MG/2ML IJ SOLN
INTRAMUSCULAR | Status: DC | PRN
  Administered 2015-08-28: 4 mg via INTRAVENOUS

## 2015-08-28 MED ORDER — FAMOTIDINE 20 MG PO TABS
ORAL_TABLET | ORAL | Status: AC
  Filled 2015-08-28: qty 1

## 2015-08-28 MED ORDER — DEXAMETHASONE SODIUM PHOSPHATE 4 MG/ML IJ SOLN
INTRAMUSCULAR | Status: DC | PRN
  Administered 2015-08-28: 10 mg via INTRAVENOUS

## 2015-08-28 MED ORDER — HEPARIN SODIUM (PORCINE) 5000 UNIT/ML IJ SOLN
5000.0000 [IU] | Freq: Once | INTRAMUSCULAR | Status: AC
  Administered 2015-08-28: 5000 [IU] via SUBCUTANEOUS

## 2015-08-28 MED ORDER — LACTATED RINGERS IV SOLN
INTRAVENOUS | Status: DC
  Administered 2015-08-28: 09:00:00 via INTRAVENOUS

## 2015-08-28 MED ORDER — OXYCODONE HCL 5 MG PO TABS: 5.0000 mg | ORAL_TABLET | Freq: Once | ORAL | Status: DC | PRN

## 2015-08-28 MED ORDER — SUGAMMADEX SODIUM 200 MG/2ML IV SOLN
INTRAVENOUS | Status: DC | PRN
  Administered 2015-08-28: 300 mg via INTRAVENOUS

## 2015-08-28 MED ORDER — ROCURONIUM BROMIDE 100 MG/10ML IV SOLN
INTRAVENOUS | Status: DC | PRN
  Administered 2015-08-28: 30 mg via INTRAVENOUS

## 2015-08-28 MED ORDER — FENTANYL CITRATE (PF) 100 MCG/2ML IJ SOLN: 25.0000 ug | INTRAMUSCULAR | Status: DC | PRN

## 2015-08-28 MED ORDER — ACETAMINOPHEN 10 MG/ML IV SOLN
INTRAVENOUS | Status: AC
  Filled 2015-08-28: qty 100

## 2015-08-28 MED ORDER — FAMOTIDINE 20 MG PO TABS
20.0000 mg | ORAL_TABLET | Freq: Once | ORAL | Status: AC
  Administered 2015-08-28: 20 mg via ORAL

## 2015-08-28 MED ORDER — MIDAZOLAM HCL 2 MG/2ML IJ SOLN
INTRAMUSCULAR | Status: DC | PRN
  Administered 2015-08-28: 2 mg via INTRAVENOUS

## 2015-08-28 MED ORDER — HYDROCODONE-ACETAMINOPHEN 5-325 MG PO TABS: 1.0000 | ORAL_TABLET | Freq: Four times a day (QID) | ORAL | Status: DC | PRN

## 2015-08-28 MED ORDER — BUPIVACAINE-EPINEPHRINE (PF) 0.25% -1:200000 IJ SOLN
INTRAMUSCULAR | Status: AC
  Filled 2015-08-28: qty 30

## 2015-08-28 MED ORDER — LIDOCAINE HCL (CARDIAC) 20 MG/ML IV SOLN
INTRAVENOUS | Status: DC | PRN
Start: 2015-08-28 — End: 2015-08-28
  Administered 2015-08-28: 100 mg via INTRAVENOUS

## 2015-08-28 MED ORDER — FENTANYL CITRATE (PF) 100 MCG/2ML IJ SOLN
INTRAMUSCULAR | Status: DC | PRN
  Administered 2015-08-28: 100 ug via INTRAVENOUS

## 2015-08-28 MED ORDER — OXYCODONE HCL 5 MG/5ML PO SOLN: 5.0000 mg | Freq: Once | ORAL | Status: DC | PRN

## 2015-08-28 MED ORDER — BUPIVACAINE-EPINEPHRINE (PF) 0.25% -1:200000 IJ SOLN
INTRAMUSCULAR | Status: DC | PRN
  Administered 2015-08-28: 30 mL

## 2015-08-28 MED ORDER — GLYCOPYRROLATE 0.2 MG/ML IJ SOLN
INTRAMUSCULAR | Status: DC | PRN
  Administered 2015-08-28: 0.2 mg via INTRAVENOUS

## 2015-08-28 MED ORDER — BUPIVACAINE HCL (PF) 0.5 % IJ SOLN
INTRAMUSCULAR | Status: AC
  Filled 2015-08-28: qty 30

## 2015-08-28 SURGICAL SUPPLY — 30 items
ADHESIVE MASTISOL STRL (MISCELLANEOUS) ×6 IMPLANT
CANISTER SUCT 1200ML W/VALVE (MISCELLANEOUS) ×3 IMPLANT
CLOSURE WOUND 1/2 X4 (GAUZE/BANDAGES/DRESSINGS) ×1
CNTNR SPEC 2.5X3XGRAD LEK (MISCELLANEOUS) ×1
CONT SPEC 4OZ STER OR WHT (MISCELLANEOUS) ×2
CONTAINER SPEC 2.5X3XGRAD LEK (MISCELLANEOUS) ×1 IMPLANT
DRAPE LAPAROTOMY 100X77 ABD (DRAPES) ×3 IMPLANT
ELECT CAUTERY NEEDLE TIP 1.0 (MISCELLANEOUS)
ELECTRODE CAUTERY NEDL TIP 1.0 (MISCELLANEOUS) IMPLANT
GAUZE FLUFF 18X24 1PLY STRL (GAUZE/BANDAGES/DRESSINGS) IMPLANT
GAUZE SPONGE 4X4 12PLY STRL (GAUZE/BANDAGES/DRESSINGS) ×3 IMPLANT
GLOVE BIO SURGEON STRL SZ7.5 (GLOVE) ×18 IMPLANT
GOWN STRL REUS W/ TWL LRG LVL3 (GOWN DISPOSABLE) ×3 IMPLANT
GOWN STRL REUS W/TWL LRG LVL3 (GOWN DISPOSABLE) ×6
KIT RM TURNOVER STRD PROC AR (KITS) ×3 IMPLANT
LABEL OR SOLS (LABEL) ×3 IMPLANT
NEEDLE HYPO 22GX1.5 SAFETY (NEEDLE) ×3 IMPLANT
NEEDLE HYPO 25X1 1.5 SAFETY (NEEDLE) IMPLANT
NS IRRIG 500ML POUR BTL (IV SOLUTION) ×3 IMPLANT
PACK BASIN MINOR ARMC (MISCELLANEOUS) ×3 IMPLANT
PAD GROUND ADULT SPLIT (MISCELLANEOUS) ×3 IMPLANT
SPONGE LAP 18X18 5 PK (GAUZE/BANDAGES/DRESSINGS) ×3 IMPLANT
STRIP CLOSURE SKIN 1/2X4 (GAUZE/BANDAGES/DRESSINGS) ×2 IMPLANT
SUT MNCRL 4-0 (SUTURE) ×2
SUT MNCRL 4-0 27XMFL (SUTURE) ×1
SUT VIC AB 3-0 SH 27 (SUTURE) ×2
SUT VIC AB 3-0 SH 27X BRD (SUTURE) ×1 IMPLANT
SUTURE MNCRL 4-0 27XMF (SUTURE) ×1 IMPLANT
SYR BULB EAR ULCER 3OZ GRN STR (SYRINGE) IMPLANT
SYRINGE 10CC LL (SYRINGE) ×3 IMPLANT

## 2015-08-28 NOTE — Anesthesia Preprocedure Evaluation (Signed)
Anesthesia Evaluation  Patient identified by MRN, date of birth, ID band Patient awake    Reviewed: Allergy & Precautions, H&P , NPO status , Patient's Chart, lab work & pertinent test results  History of Anesthesia Complications Negative for: history of anesthetic complications  Airway Mallampati: III  TM Distance: >3 FB Neck ROM: limited    Dental  (+) Poor Dentition, Missing, Upper Dentures   Pulmonary neg pulmonary ROS, former smoker,    Pulmonary exam normal breath sounds clear to auscultation       Cardiovascular Exercise Tolerance: Poor hypertension, (-) angina+ CAD, + Cardiac Stents and + CABG  (-) DOE Normal cardiovascular exam Rhythm:regular Rate:Normal     Neuro/Psych  Neuromuscular disease negative psych ROS   GI/Hepatic negative GI ROS, Neg liver ROS,   Endo/Other  negative endocrine ROS  Renal/GU Renal disease  negative genitourinary   Musculoskeletal  (+) Arthritis ,   Abdominal   Peds  Hematology negative hematology ROS (+)   Anesthesia Other Findings Past Medical History:   Impaired renal function                                      Arteriosclerosis of coronary artery                          Essential hypertension                                       Malignant neoplasm of breast                                 Muscle spasms of neck                                        Arthritis of shoulder region                                 Tobacco abuse                                                Hypercholesteremia                                          Interventions / Surgery:  Coronary artery bypass grafting, 05/27/2005. SVG to LAD, SVG to diagonal, SVG to OM.  Cardiac catheterization, 07/24/2007. Occluded SVG's to LAD and diagonal. PCI of left main stenosis with Xience DES. PCI of diagonal ostium with Xience DES.  PTA with stent to the left internal carotid artery, 08/2007    Reproductive/Obstetrics negative OB ROS                             Anesthesia Physical Anesthesia Plan  ASA: III  Anesthesia Plan: General ETT   Post-op Pain Management:  Induction:   Airway Management Planned:   Additional Equipment:   Intra-op Plan:   Post-operative Plan:   Informed Consent: I have reviewed the patients History and Physical, chart, labs and discussed the procedure including the risks, benefits and alternatives for the proposed anesthesia with the patient or authorized representative who has indicated his/her understanding and acceptance.   Dental Advisory Given  Plan Discussed with: Anesthesiologist, CRNA and Surgeon  Anesthesia Plan Comments:         Anesthesia Quick Evaluation

## 2015-08-28 NOTE — Anesthesia Procedure Notes (Signed)
Procedure Name: Intubation Date/Time: 08/28/2015 9:00 AM Performed by: Doreen Salvage Pre-anesthesia Checklist: Patient identified, Patient being monitored, Timeout performed, Emergency Drugs available and Suction available Patient Re-evaluated:Patient Re-evaluated prior to inductionOxygen Delivery Method: Circle system utilized Preoxygenation: Pre-oxygenation with 100% oxygen Intubation Type: IV induction Ventilation: Mask ventilation without difficulty Laryngoscope Size: Mac and 3 Grade View: Grade II Tube type: Oral Tube size: 7.0 mm Number of attempts: 1 Airway Equipment and Method: Stylet Placement Confirmation: ETT inserted through vocal cords under direct vision,  positive ETCO2 and breath sounds checked- equal and bilateral Secured at: 20 cm Tube secured with: Tape Dental Injury: Teeth and Oropharynx as per pre-operative assessment

## 2015-08-28 NOTE — Anesthesia Postprocedure Evaluation (Signed)
  Anesthesia Post-op Note  Patient: Donna Santos  Procedure(s) Performed: Procedure(s): EXCISION MASS; remove massive right chest wall (Right)  Anesthesia type:General ETT  Patient location: PACU  Post pain: Pain level controlled  Post assessment: Post-op Vital signs reviewed, Patient's Cardiovascular Status Stable, Respiratory Function Stable, Patent Airway and No signs of Nausea or vomiting  Post vital signs: Reviewed and stable  Last Vitals:  Filed Vitals:   08/28/15 1037  BP: 147/79  Pulse: 56  Temp: 36.4 C  Resp: 16    Level of consciousness: awake, alert  and patient cooperative  Complications: No apparent anesthesia complications

## 2015-08-28 NOTE — Transfer of Care (Signed)
Immediate Anesthesia Transfer of Care Note  Patient: Donna Santos  Procedure(s) Performed: Procedure(s): EXCISION MASS; remove massive right chest wall (Right)  Patient Location: PACU  Anesthesia Type:General  Level of Consciousness: sedated  Airway & Oxygen Therapy: Patient Spontanous Breathing and Patient connected to face mask oxygen  Post-op Assessment: Report given to RN and Post -op Vital signs reviewed and stable  Post vital signs: Reviewed and stable  Last Vitals:  Filed Vitals:   08/28/15 0939  BP: 140/56  Pulse: 70  Temp: 36.6 C  Resp: 16    Complications: No apparent anesthesia complications

## 2015-08-28 NOTE — Progress Notes (Signed)
Applied ice pack to left arm where ivf site infiltrated

## 2015-08-28 NOTE — Op Note (Signed)
  Pre-operative Diagnosis: Breast Cancer history with right chest wall mass    Post-operative Diagnosis: Same   Surgeon: Jerrol Banana. Burt Knack, MD FACS  Anesthesia: Gen. with endotracheal tube  Procedure: Excisional biopsy of right mastectomy site chest wall mass  Procedure Details  The patient was seen again in the Holding Room. The benefits, complications, treatment options, and expected outcomes were discussed with the patient. The risks of bleeding, infection, recurrence of symptoms, failure to resolve symptoms, hematoma, seroma, open wound, cosmetic deformity, and the need for further surgery were discussed.  The patient was taken to Operating Room, identified as [name] and the procedure verified.  A Time Out was held and the above information confirmed.  Prior to the induction of general anesthesia, antibiotic prophylaxis was administered. VTE prophylaxis was in place. Appropriate anesthesia was then administered and tolerated well. The chest was prepped with Chloraprep and draped in the sterile fashion. The patient was positioned in the supine position.   Local and static was infiltrated into the skin and subcutaneous tissues tissues around the palpable mass in the medial portion of the left mastectomy site incision was drawn out and plan to avoid skin ischemia then the incision was executed sharply and then with sharp and blunt dissection as well as electrocautery dissection the mass was elevated and found to be densely adherent and infiltrating into the pectoralis major muscle portion of the muscle was excised. The specimen was sent off fresh.  I called the pathologist intraoperatively to notify her of the clinical scenario and the suspicion of this being breast cancer and spoke directly with Dr. Luana Shu in pathology    Hemostasis was with electrocautery. Additional Marcaine was infiltrated into the skin and subcutaneous tissues of the cavity. Once assuring that hemostasis was  adequate and checked multiple times the wound was closed with interrupted 3-0 Vicryl followed by 4-0 subcuticular Monocryl sutures.   Steri-Strips Mastisol and sterile dressings were placed  Patient was taken to the recovery room in stable condition where a postoperative chest film has been ordered.    Findings: 2  Centimeter mass in the mastectomy site densely adherent to pectoralis major muscle and infiltrating  Estimated Blood Loss: Minimal         Drains: None         Specimens: Right chest wall mass      Complications: None                  Condition: Stable   Richard E. Burt Knack, MD, FACS

## 2015-08-28 NOTE — Progress Notes (Signed)
Preoperative Review   Patient is met in the preoperative holding area. The history is reviewed in the chart and with the patient. I personally reviewed the options and rationale as well as the risks of this procedure that have been previously discussed with the patient. All questions asked by the patient and/or family were answered to their satisfaction.  Patient is still in [place] close and will be marked appropriately in the next few minutes. Family was waiting in the waiting area not present for this discussion.  Patient agrees to proceed with this procedure at this time.  Florene Glen M.D. FACS

## 2015-08-28 NOTE — Discharge Instructions (Signed)
Remove dressing in 24 hours. °May shower in 24 hours. °Leave paper strips in place. °Resume all home medications. °Follow-up with Dr. Kekai Geter in 10 days. °

## 2015-09-03 ENCOUNTER — Telehealth: Payer: Self-pay

## 2015-09-03 LAB — SURGICAL PATHOLOGY

## 2015-09-03 NOTE — Telephone Encounter (Signed)
Dr. Luana Shu called at this time to give critical pathology results:  Right Chest Wall Mass showed: Low Grade Invasive Mammary Carcinoma with positive muscle margin  ERPR - positive  HER2- Negative  Readback completed over the phone with Dr. Luana Shu and these results were immediately given to Dr. Burt Knack.  Pt is scheduled for follow-up tomorrow at 0930 (09/04/15) and will be notified at that time by Dr. Burt Knack.

## 2015-09-04 ENCOUNTER — Ambulatory Visit (INDEPENDENT_AMBULATORY_CARE_PROVIDER_SITE_OTHER): Payer: Medicare Other | Admitting: Surgery

## 2015-09-04 ENCOUNTER — Encounter: Payer: Self-pay | Admitting: Surgery

## 2015-09-04 ENCOUNTER — Telehealth: Payer: Self-pay

## 2015-09-04 VITALS — BP 189/79 | HR 58 | Temp 98.1°F | Ht 64.0 in | Wt 154.0 lb

## 2015-09-04 DIAGNOSIS — C50911 Malignant neoplasm of unspecified site of right female breast: Secondary | ICD-10-CM

## 2015-09-04 NOTE — Telephone Encounter (Signed)
Can you please make a referral for patient to be seen at the [facility] for presence of an invasive carcinoma suggestive of breast recurrence.  Patient needs to be seen by the Medical Oncologist and Radiation Oncologist.  Thank you.

## 2015-09-04 NOTE — Telephone Encounter (Signed)
Med Onc: [facility] Tuesday 09/09/2015 @ 8:30AM Dr. Aletha Halim On:

## 2015-09-04 NOTE — Telephone Encounter (Signed)
Appointments have been made and referrals have been attached to appointments.

## 2015-09-04 NOTE — Progress Notes (Signed)
Outpatient postop visit  09/04/2015  Donna Santos is an 81 y.o. female.    Procedure: Right chest wall biopsy  CC: Minimal pain  HPI: This a patient with a personal history of breast cancer many many years ago who presents with a mass on her chest wall which has been biopsied and was biopsy-proven breast cancer. Minimal pain is noted but no complaints about the wound.  Medications reviewed.    Physical Exam:  BP 189/79 mmHg  Pulse 58  Temp(Src) 98.1 F (36.7 C) (Oral)  Ht 5\' 4"  (1.626 m)  Wt 154 lb (69.854 kg)  BMI 26.42 kg/m2    PE: Normal postoperative appearance with minimal ecchymosis nontender.    Assessment/Plan:  Pathology is reviewed with the patient and her son the presence of an invasive carcinoma suggestive of breast recurrence or a new breast primary is discussed fact that there is positive margins on the deep margin or skeletal muscle is discussed as well. This is not amenable to excision and will require radiotherapy for local control. She may also be a candidate for chemotherapy although I do not have ER/PR receptors back yet. I suggested Med Onc and Rad Onc consultation and she can follow up with me on an as-needed basis.  Florene Glen, MD, FACS

## 2015-09-08 ENCOUNTER — Telehealth: Payer: Self-pay

## 2015-09-08 ENCOUNTER — Telehealth: Payer: Self-pay | Admitting: *Deleted

## 2015-09-08 NOTE — Telephone Encounter (Signed)
Tanya Nones, RN (Breast Coordinator) called letting us know that she had called patient to let her know about her appointment tomorrow with Dr. Stephens Shire day and with Dr. Anselm Lis tal on 09/10/2015.

## 2015-09-08 NOTE — Telephone Encounter (Signed)
Called patient today to establish navigation services.  She was not aware of her appointment with the medical oncologist tomorrow.  Discussed with her and her son the need for consultation only.  Will take educational literature to the [facility] office tomorrow.  Bonney Roussel, at Dr. Antionette Char office to confirm there had been no change of plans for medical oncology consultation at this time.

## 2015-09-09 ENCOUNTER — Encounter: Payer: Self-pay | Admitting: *Deleted

## 2015-09-09 ENCOUNTER — Encounter: Payer: Self-pay | Admitting: Internal Medicine

## 2015-09-09 ENCOUNTER — Inpatient Hospital Stay: Payer: Medicare Other | Attending: Internal Medicine | Admitting: Internal Medicine

## 2015-09-09 VITALS — BP 180/78 | HR 80 | Temp 96.7°F | Ht 61.02 in | Wt 156.1 lb

## 2015-09-09 DIAGNOSIS — Z7982 Long term (current) use of aspirin: Secondary | ICD-10-CM | POA: Diagnosis not present

## 2015-09-09 DIAGNOSIS — C50911 Malignant neoplasm of unspecified site of right female breast: Secondary | ICD-10-CM | POA: Insufficient documentation

## 2015-09-09 DIAGNOSIS — I10 Essential (primary) hypertension: Secondary | ICD-10-CM

## 2015-09-09 DIAGNOSIS — Z79899 Other long term (current) drug therapy: Secondary | ICD-10-CM | POA: Diagnosis not present

## 2015-09-09 DIAGNOSIS — K869 Disease of pancreas, unspecified: Secondary | ICD-10-CM | POA: Diagnosis not present

## 2015-09-09 DIAGNOSIS — R918 Other nonspecific abnormal finding of lung field: Secondary | ICD-10-CM | POA: Diagnosis not present

## 2015-09-09 DIAGNOSIS — I517 Cardiomegaly: Secondary | ICD-10-CM | POA: Insufficient documentation

## 2015-09-09 DIAGNOSIS — Z809 Family history of malignant neoplasm, unspecified: Secondary | ICD-10-CM | POA: Diagnosis not present

## 2015-09-09 DIAGNOSIS — M129 Arthropathy, unspecified: Secondary | ICD-10-CM

## 2015-09-09 DIAGNOSIS — I2510 Atherosclerotic heart disease of native coronary artery without angina pectoris: Secondary | ICD-10-CM | POA: Insufficient documentation

## 2015-09-09 DIAGNOSIS — N281 Cyst of kidney, acquired: Secondary | ICD-10-CM | POA: Insufficient documentation

## 2015-09-09 DIAGNOSIS — C50211 Malignant neoplasm of upper-inner quadrant of right female breast: Secondary | ICD-10-CM

## 2015-09-09 DIAGNOSIS — Z87891 Personal history of nicotine dependence: Secondary | ICD-10-CM

## 2015-09-09 DIAGNOSIS — K449 Diaphragmatic hernia without obstruction or gangrene: Secondary | ICD-10-CM | POA: Insufficient documentation

## 2015-09-09 DIAGNOSIS — E785 Hyperlipidemia, unspecified: Secondary | ICD-10-CM

## 2015-09-09 DIAGNOSIS — M810 Age-related osteoporosis without current pathological fracture: Secondary | ICD-10-CM

## 2015-09-09 DIAGNOSIS — C7981 Secondary malignant neoplasm of breast: Secondary | ICD-10-CM

## 2015-09-09 DIAGNOSIS — Z9013 Acquired absence of bilateral breasts and nipples: Secondary | ICD-10-CM | POA: Diagnosis not present

## 2015-09-09 MED ORDER — ANASTROZOLE 1 MG PO TABS: 1.0000 mg | ORAL_TABLET | Freq: Every day | ORAL | Status: DC

## 2015-09-09 NOTE — Progress Notes (Signed)
Previous riight mastectomy surgery performed at [facility] in 08/2001. Dx with Intraductal carcinoma of right breast-5% of tumor showing early invasion.  12 lymph nodes were negative.  ER positive 74%, PR negative; HER2/neu Negative.   Patient was subsequently started on Tamoxifen therapy.  Patient declined the influenza vaccine 

## 2015-09-09 NOTE — Progress Notes (Signed)
Hx of Breast cancer, had a R mastectomy in 2003. No other treatment recommended at that time. Pt felt a mass w/tenderness on R chest wall for several months. Pt had a biopsy/removal of most of mass recently with Dr Burt Knack. Pt feels at her baseline. Appetite is good. Energy is" good/fair for her age". Occ constipation. Continent of B/B. Denies any pain or aching. No edema. No dyspnea noted.

## 2015-09-09 NOTE — Progress Notes (Signed)
  Oncology Nurse Navigator Documentation  Referral date to RadOnc/MedOnc: 09/09/15 (09/09/15 1500) Navigator Encounter Type: Initial MedOnc (09/09/15 1500) Patient Visit Type: Medonc (09/09/15 1500) Treatment Phase: Other (09/09/15 1500) Barriers/Navigation Needs: Education (09/09/15 1500) Education: Newly Diagnosed Cancer Education (09/09/15 1500)      Met patient, her son and daughter-in-law today during her initial medical oncology consult with Dr. Herschell Dimes patient breast cancer educational literature, "My Breast Cancer Treatment Handbook" by Josephine Igo, RN.  She is to call if she has any questions or needs.          Time Spent with Patient: 60 (09/09/15 1500)

## 2015-09-09 NOTE — Patient Instructions (Addendum)
You will need to take Calcium 600 +Vitamin D 1200 mg  with the Arimidex   Anastrozole tablets What is this medicine? ANASTROZOLE (an AS troe zole) is used to treat breast cancer in women who have gone through menopause. Some types of breast cancer depend on estrogen to grow, and this medicine can stop tumor growth by blocking estrogen production. This medicine may be used for other purposes; ask your health care provider or pharmacist if you have questions. What should I tell my health care provider before I take this medicine? They need to know if you have any of these conditions: -liver disease -an unusual or allergic reaction to anastrozole, other medicines, foods, dyes, or preservatives -pregnant or trying to get pregnant -breast-feeding How should I use this medicine? Take this medicine by mouth with a glass of water. Follow the directions on the prescription label. You can take this medicine with or without food. Take your doses at regular intervals. Do not take your medicine more often than directed. Do not stop taking except on the advice of your doctor or health care professional. Talk to your pediatrician regarding the use of this medicine in children. Special care may be needed. Overdosage: If you think you have taken too much of this medicine contact a poison control center or emergency room at once. NOTE: This medicine is only for you. Do not share this medicine with others. What if I miss a dose? If you miss a dose, take it as soon as you can. If it is almost time for your next dose, take only that dose. Do not take double or extra doses. What may interact with this medicine? Do not take this medicine with any of the following medications: -female hormones, like estrogens or progestins and birth control pills This medicine may also interact with the following medications: -tamoxifen This list may not describe all possible interactions. Give your health care provider a list of  all the medicines, herbs, non-prescription drugs, or dietary supplements you use. Also tell them if you smoke, drink alcohol, or use illegal drugs. Some items may interact with your medicine. What should I watch for while using this medicine? Visit your doctor or health care professional for regular checks on your progress. Let your doctor or health care professional know about any unusual vaginal bleeding. Do not treat yourself for diarrhea, nausea, vomiting or other side effects. Ask your doctor or health care professional for advice. What side effects may I notice from receiving this medicine? Side effects that you should report to your doctor or health care professional as soon as possible: -allergic reactions like skin rash, itching or hives, swelling of the face, lips, or tongue -any new or unusual symptoms -breathing problems -chest pain -leg pain or swelling -vomiting Side effects that usually do not require medical attention (report to your doctor or health care professional if they continue or are bothersome): -back or bone pain and osteoporosis -cough, or throat infection -diarrhea or constipation -dizziness -headache -hot flashes -loss of appetite -nausea -sweating and hot flashes -weakness and tiredness -weight gain This list may not describe all possible side effects. Call your doctor for medical advice about side effects. You may report side effects to FDA at 1-800-FDA-1088. Where should I keep my medicine? Keep out of the reach of children. Store at room temperature between 20 and 25 degrees C (68 and 77 degrees F). Throw away any unused medicine after the expiration date. NOTE: This sheet is a summary. It  may not cover all possible information. If you have questions about this medicine, talk to your doctor, pharmacist, or health care provider.    2016, Elsevier/Gold Standard. (2008-01-26 16:31:52)    Bone Densitometry Bone densitometry is an imaging test that uses  a special X-ray to measure the amount of calcium and other minerals in your bones (bone density). This test is also known as a bone mineral density test or dual-energy X-ray absorptiometry (DXA). The test can measure bone density at your hip and your spine. It is similar to having a regular X-ray. You may have this test to:  Diagnose a condition that causes weak or thin bones (osteoporosis).  Predict your risk of a broken bone (fracture).  Determine how well osteoporosis treatment is working. LET [facility] CARE PROVIDER KNOW ABOUT:  Any allergies you have.  All medicines you are taking, including vitamins, herbs, eye drops, creams, and over-the-counter medicines.  Previous problems you or members of your family have had with the use of anesthetics.  Any blood disorders you have.  Previous surgeries you have had.  Medical conditions you have.  Possibility of pregnancy.  Any other medical test you had within the previous 14 days that used contrast material. RISKS AND COMPLICATIONS Generally, this is a safe procedure. However, problems can occur and may include the following:  This test exposes you to a very small amount of radiation.  The risks of radiation exposure may be greater to unborn children. BEFORE THE PROCEDURE  Do not take any calcium supplements for 24 hours before having the test. You can otherwise eat and drink what you usually do.  Take off all metal jewelry, eyeglasses, dental appliances, and any other metal objects. PROCEDURE  You may lie on an exam table. There will be an X-ray generator below you and an imaging device above you.  Other devices, such as boxes or braces, may be used to position your body properly for the scan.  You will need to lie still while the machine slowly scans your body.  The images will show up on a computer monitor. AFTER THE PROCEDURE You may need more testing at a later time.   This information is not intended to replace  advice given to you by your health care provider. Make sure you discuss any questions you have with your health care provider.   Document Released: 12/07/2004 Document Revised: 12/06/2014 Document Reviewed: 04/25/2014 Elsevier Interactive Patient Education [employer].

## 2015-09-09 NOTE — Progress Notes (Signed)
[place] NOTE  Patient Care Team: Glean Hess, MD as PCP - General (Family Medicine)  CHIEF COMPLAINTS/PURPOSE OF CONSULTATION: Breast cancer  ONCOLOGIC HISTORY:  # 2016- CHEST WALL RECURRENCE [~2.5CM] s/p RIGHT MASTEC [ Dr.Cooper] Cribriform type; ER/PR >90%; HER 2 neu-NEG; Recm RT; Arimidex  # Lung nodule [54m LL; Oct 2016]; Left kidney ? 2.8cm Complex Cyst; Pancreas 10 mm cyst  # 2002- Right Breast" Predominant DCIS- cribriform/low grade; 1.5cm with 5% early invasion"; ER 74%; PR-NEG; her 2 Neg; S/P Mastec & ALND [no residual ca/dcis] & Recon]; NO TAM  HISTORY OF PRESENTING ILLNESS:  [name] 81y.o.  female was in fairly good health except for arthritis and stable CAD currently accompanied by her daughter and son to review the management for her newly diagnosed "breast cancer".   As detailed above patient had DCIS/small focus of of the right breast invasive cancer status post mastectomy; along with ALND with reconstruction in 2002. Patient did not take tamoxifen.  Patient few months ago noted to have a mass in the right upper/inner chest wall area which was appropriately worked up the CT scan; also excisional biopsy of the mass. She has been referred to medical oncology for further recommendations.  Patient denies any unusual shortness of breath or cough. Denies any unusual weight loss or bone pain. Denies any chest pain. No unusual headaches or vision changes. She denies any other lumps or bumps.  ROS: A complete 10 point review of system is done which is negative except mentioned above in history of present illness  MEDICAL HISTORY:  Past Medical History  Diagnosis Date  . Impaired renal function   . Arteriosclerosis of coronary artery   . Essential hypertension   . Malignant neoplasm of breast ([place]   . Muscle spasms of neck   . Arthritis of shoulder region   . Tobacco abuse   . Hypercholesteremia   . Breast cancer ([place]   .  Cataract     SURGICAL HISTORY: Past Surgical History  Procedure Laterality Date  . Coronary artery bypass graft  2006  . Mastectomy, radical Right 2002  . Total abdominal hysterectomy    . Carotid endarterectomy    . Mass excision Right 08/28/2015    Procedure: EXCISION MASS; remove massive right chest wall;  Surgeon: RFlorene Glen MD;  Location: [facility] ORS;  Service: General;  Laterality: Right;    SOCIAL HISTORY: Social History   Social History  . Marital Status: Widowed    Spouse Name: N/A  . Number of Children: N/A  . Years of Education: N/A   Occupational History  . Not on file.   Social History Main Topics  . Smoking status: Former SResearch scientist (life sciences) . Smokeless tobacco: Never Used  . Alcohol Use: No  . Drug Use: No  . Sexual Activity: Not on file   Other Topics Concern  . Not on file   Social History Narrative    FAMILY HISTORY: Family History  Problem Relation Age of Onset  . Hypertension Father   . Cancer Father     ALLERGIES:  is allergic to ace inhibitors.  MEDICATIONS:  Current Outpatient Prescriptions  Medication Sig Dispense Refill  . amLODipine (NORVASC) 5 MG tablet Take 1 tablet by mouth 2 (two) times daily.     .Marland Kitchenaspirin 81 MG tablet Take 1 tablet by mouth daily.    . chlorthalidone (HYGROTON) 25 MG tablet Take 1 tablet by mouth daily.    .Marland Kitchen  latanoprost (XALATAN) 0.005 % ophthalmic solution INT 1 GTT INTO EACH EYE QHS.  6  . losartan (COZAAR) 25 MG tablet TAKE 1 TABLET EVERY DAY 30 tablet 5  . metoprolol (LOPRESSOR) 100 MG tablet Take 100 mg by mouth 2 (two) times daily.     . nitroGLYCERIN (NITROSTAT) 0.4 MG SL tablet Frequency:PHARMDIR   Dosage:0.0     Instructions:  Note:Q5 MIN  prn, may repeat x 3  prn; disp 1 bottle Dose: 1 TAB    . timolol (TIMOPTIC) 0.25 % ophthalmic solution INSTILL ONE DROP INTO BOTH EYES EVERY MORNING.  6  . anastrozole (ARIMIDEX) 1 MG tablet Take 1 tablet (1 mg total) by mouth daily. 90 tablet 3  . HYDROcodone-acetaminophen  (NORCO/VICODIN) 5-325 MG tablet Take 1 tablet by mouth every 6 (six) hours as needed for moderate pain. (Patient not taking: Reported on 09/09/2015) 30 tablet 0   No current facility-administered medications for this visit.      Marland Kitchen  PHYSICAL EXAMINATION: ECOG PERFORMANCE STATUS: 1 - Symptomatic but completely ambulatory  Filed Vitals:   09/09/15 0923  BP: 180/78  Pulse: 80  Temp: 96.7 F (35.9 C)   Filed Weights   09/09/15 0923  Weight: 156 lb 1.4 oz (70.8 kg)    GENERAL: Well-nourished well-developed; Alert, no distress and comfortable.   She is accompanied by her son and daughter.  EYES: no pallor or icterus OROPHARYNX: no thrush or ulceration; good dentition  NECK: supple, no masses felt LYMPH:  no palpable lymphadenopathy in the cervical, axillary or inguinal regions LUNGS: clear to auscultation and  No wheeze or crackles HEART/CVS: regular rate & rhythm and no murmurs; No lower extremity edema ABDOMEN: abdomen soft, non-tender and normal bowel sounds Musculoskeletal:no cyanosis of digits and no clubbing  PSYCH: alert & oriented x 3 with fluent speech NEURO: no focal motor/sensory deficits SKIN/chest wall:  ~2 cm mobile subcutaneous mass noted; right mastectomy incision noted.  LABORATORY DATA:  I have reviewed the data as listed Lab Results  Component Value Date   WBC 10.6 08/27/2015   HGB 14.6 08/28/2015   HCT 43.0 08/28/2015   MCV 91.7 08/27/2015   PLT 357 08/27/2015    Recent Labs  07/31/15 1024 08/21/15 1233 08/27/15 1304 08/28/15 0827  NA 138  --  134* 137  K 4.2  --  3.2* 3.6  CL 96*  --  95*  --   CO2 22  --  28  --   GLUCOSE 124*  --  119* 131*  BUN 18  --  18  --   CREATININE 1.11* 1.00 1.01*  --   CALCIUM 9.5  --  9.5  --   GFRNONAA 44*  --  48*  --   GFRAA 51*  --  56*  --   PROT 7.4  --   --   --   ALBUMIN 4.5  --   --   --   AST 15  --   --   --   ALT 12  --   --   --   ALKPHOS 44  --   --   --   BILITOT 0.4  --   --   --      RADIOGRAPHIC STUDIES: I have personally reviewed the radiological images as listed and agreed with the findings in the report. Ct Chest W Contrast  08/21/2015   CLINICAL DATA:  Former smoking history, history of coronary artery stents and right mastectomy for breast carcinoma  EXAM: CT CHEST WITH CONTRAST  TECHNIQUE: Multidetector CT imaging of the chest was performed during intravenous contrast administration.  CONTRAST:  13m OMNIPAQUE IOHEXOL 300 MG/ML  SOLN  COMPARISON:  Chest x-ray of 09/19/2010  FINDINGS: On lung window images no parenchymal infiltrate is seen. There is no evidence of pleural effusion. However, there is a 6 mm nodule within the left lower lobe posteriorly. This nodule is slightly dense and could be partially calcified. Given risk factors for bronchogenic carcinoma, follow-up chest CT at 6 - 12 months is recommended. This recommendation follows the consensus statement: Guidelines for Management of Small Pulmonary Nodules Detected on CT Scans: A Statement from the [place] published in Radiology 2005;237:395-400. No additional lung nodule is seen. The central airway is patent. Median sternotomy sutures are noted.  On soft tissue window images, thyroid gland is unremarkable. A right breast prosthesis is noted. The pulmonary arteries and thoracic aorta opacify with no acute abnormality noted. Moderate atherosclerotic disease of the thoracic aorta is noted diffusely. There are a few small mediastinal nodes present, none of which are pathologically enlarged. Coronary artery stents are present, and moderate cardiomegaly is noted. There is a small hiatal hernia noted. A tiny low-attenuation structure in the tail of the pancreas measures 10 mm in diameter and probably represents a small pseudocyst. CT the abdomen with oral and IV contrast may be helpful to evaluate further. A low-attenuation rounded structure emanates from the upper pole of the somewhat atrophic appearing left  kidney measuring 2.8 cm with attenuation of 28 HU. This could represent a complex complicated cyst, but again CT the abdomen pelvis with oral and IV contrast media is recommended. Probable small right renal cysts are noted as well.  IMPRESSION: 1. 6 mm nodule within the left lower lobe. If the patient is at high risk for bronchogenic carcinoma, follow-up chest CT at 6-12 months is recommended. If the patient is at low risk for bronchogenic carcinoma, follow-up chest CT at 12 months is recommended. This recommendation follows the consensus statement: Guidelines for Management of Small Pulmonary Nodules Detected on CT Scans: A Statement from the [place] published in Radiology 2005;237:395-400. 1. Small hiatal hernia. 2. 10 mm low-attenuation structure in the tail of the pancreas probably represents a pseudocyst but consider CT of the abdomen using pancreatic protocol to assess further. 3. Complex cystic structure emanates from the atrophic appearing left kidney. This also could be assessed on CT of the abdomen pelvis.   Electronically Signed   By: PIvar DrapeM.D.   On: 08/21/2015 14:36    ASSESSMENT & PLAN:   # RIGHT IPSILATERAL CHEST WALL RECURRENCE STATUS post mastectomy 2002- Cribiform type; ER/PR >90%; Her2 Neu-NEG. I had a long discussion the patient and family regarding the slow growing nature/ pathology of the malignancy in detail. Based on the CT scan and no evidence of any distant prostatic disease.  I would recommend evaluation with radiation oncology for radiation given the chest wall lesion. I discussed the the usual radiation schedule; potential side effects. They have an appointment tomorrow with Dr. CDonella Stade  # With regards to systemic therapy I would recommend aromatase inhibitor-Arimidex. I discussed the potential side effects including but not limited to hot flashes musculoskeletal symptoms and also osteoporosis. Prescription for Arimidex was given.  # I would recommend to  screen for osteoporosis-; bone density test ordered. Also recommend calcium and vitamin D twice a day.  #  Left kidney cyst question complicated; recommend ultrasound of the  kidney  # Incidental 6 mm left lung nodule; 10 mm pancreatic cyst- this seems to be incidental findings; monitor for now.  The above plan of care was discussed with the patient and family in detail. All questions were answered to their satisfaction.The patient knows to call the clinic with any problems, questions or concerns.  # I left a message for Dr. Burt Knack to discuss the above plan.   Thank you Dr. Burt Knack for allowing me to participate in the care of your pleasant patient. Please do not hesitate to contact me with questions or concerns in the interim.   I spent 40 minutes counseling the patient face to face. The total time spent in the appointment was 60 minutes and more than 50% was on counseling.     Cammie Sickle, MD 09/09/2015 1:20 PM

## 2015-09-10 ENCOUNTER — Encounter: Payer: Self-pay | Admitting: Radiation Oncology

## 2015-09-10 ENCOUNTER — Ambulatory Visit
Admission: RE | Admit: 2015-09-10 | Discharge: 2015-09-10 | Disposition: A | Discharge status: Home / Self Care | Payer: Medicare Other | Source: Ambulatory Visit | Attending: Radiation Oncology | Admitting: Radiation Oncology

## 2015-09-10 VITALS — BP 191/78 | HR 56 | Temp 95.9°F | Resp 18 | Wt 155.4 lb

## 2015-09-10 DIAGNOSIS — Z79899 Other long term (current) drug therapy: Secondary | ICD-10-CM | POA: Insufficient documentation

## 2015-09-10 DIAGNOSIS — C50211 Malignant neoplasm of upper-inner quadrant of right female breast: Secondary | ICD-10-CM

## 2015-09-10 DIAGNOSIS — N289 Disorder of kidney and ureter, unspecified: Secondary | ICD-10-CM | POA: Insufficient documentation

## 2015-09-10 DIAGNOSIS — Z9011 Acquired absence of right breast and nipple: Secondary | ICD-10-CM | POA: Insufficient documentation

## 2015-09-10 DIAGNOSIS — Z809 Family history of malignant neoplasm, unspecified: Secondary | ICD-10-CM | POA: Insufficient documentation

## 2015-09-10 DIAGNOSIS — Z7982 Long term (current) use of aspirin: Secondary | ICD-10-CM | POA: Insufficient documentation

## 2015-09-10 DIAGNOSIS — I10 Essential (primary) hypertension: Secondary | ICD-10-CM | POA: Insufficient documentation

## 2015-09-10 DIAGNOSIS — Z170 Estrogen receptor positive status [ER+]: Secondary | ICD-10-CM | POA: Insufficient documentation

## 2015-09-10 DIAGNOSIS — C7989 Secondary malignant neoplasm of other specified sites: Secondary | ICD-10-CM | POA: Insufficient documentation

## 2015-09-10 DIAGNOSIS — Z510 Encounter for antineoplastic radiation therapy: Secondary | ICD-10-CM | POA: Insufficient documentation

## 2015-09-10 DIAGNOSIS — I2510 Atherosclerotic heart disease of native coronary artery without angina pectoris: Secondary | ICD-10-CM | POA: Insufficient documentation

## 2015-09-10 DIAGNOSIS — Z87891 Personal history of nicotine dependence: Secondary | ICD-10-CM | POA: Insufficient documentation

## 2015-09-10 DIAGNOSIS — Z853 Personal history of malignant neoplasm of breast: Secondary | ICD-10-CM | POA: Insufficient documentation

## 2015-09-10 DIAGNOSIS — E7800 Pure hypercholesterolemia, unspecified: Secondary | ICD-10-CM | POA: Insufficient documentation

## 2015-09-10 NOTE — Consult Note (Signed)
Except an outstanding is perfect of Radiation Oncology NEW PATIENT EVALUATION  Name: Donna Santos  MRN: [id number]  Date:   09/10/2015     DOB: [birth year]   This 81 y.o. female patient presents to the clinic for initial evaluation of recurrent breast cancer in the right chest wall.  REFERRING PHYSICIAN: Glean Hess, MD  CHIEF COMPLAINT:  Chief Complaint  Patient presents with  . Breast Cancer    Pt is here for initial consultation of breast cancer.      DIAGNOSIS: The encounter diagnosis was Malignant neoplasm of upper-inner quadrant of right female breast ([place]).   PREVIOUS INVESTIGATIONS:  CT scans reviewed Surgical pathology report reviewed Clinical notes reviewed  HPI: Patient is a 81 year old female status post right modified radical mastectomy back in 2002 for a small focus of invasive mammary carcinoma with mostly DCIS. She had a right modified radical mastectomy with axillary lymph node dissection with no adjuvant treatment. Tumor was ER positive PR borderline HER-2/neu negative. She has gone on to have a small nodular recurrence in the medial portion of the right mastectomy scar . CT scan a my review shows this nodular area between the skin and chest wall surface. She also has a 6 mm nodule in the left lower lobe not thought to be clinically significant. She underwent excisional biopsy showing invasive carcinoma low-grade consistent with invasive cribriform carcinoma of mammary origin. Cancer stent at the deep margin which was muscle. Tumor was strongly ER/PR positive HER-2/neu negative. I been asked to see her for possible further adjuvant treatment. She is doing well she does have some tenderness in that area and she states the pain is increasing over time. This may reflect some trapping of intercostal nerves. She's having no evidence of other complaints.   PLANNED TREATMENT REGIMEN: Chest wall radiation  PAST MEDICAL HISTORY:  has a past medical history of Impaired  renal function; Arteriosclerosis of coronary artery; Essential hypertension; Malignant neoplasm of breast ([place]); Muscle spasms of neck; Arthritis of shoulder region; Tobacco abuse; Hypercholesteremia; Breast cancer ([place]); and Cataract.    PAST SURGICAL HISTORY:  Past Surgical History  Procedure Laterality Date  . Coronary artery bypass graft  2006  . Mastectomy, radical Right 2002  . Total abdominal hysterectomy    . Carotid endarterectomy    . Mass excision Right 08/28/2015    Procedure: EXCISION MASS; remove massive right chest wall;  Surgeon: Florene Glen, MD;  Location: [facility] ORS;  Service: General;  Laterality: Right;    FAMILY HISTORY: family history includes Cancer in her father; Hypertension in her father.  SOCIAL HISTORY:  reports that she has quit smoking. She has never used smokeless tobacco. She reports that she does not drink alcohol or use illicit drugs.  ALLERGIES: Ace inhibitors  MEDICATIONS:  Current Outpatient Prescriptions  Medication Sig Dispense Refill  . amLODipine (NORVASC) 5 MG tablet Take 1 tablet by mouth 2 (two) times daily.     Marland Kitchen anastrozole (ARIMIDEX) 1 MG tablet Take 1 tablet (1 mg total) by mouth daily. 90 tablet 3  . aspirin 81 MG tablet Take 1 tablet by mouth daily.    . chlorthalidone (HYGROTON) 25 MG tablet Take 1 tablet by mouth daily.    Marland Kitchen HYDROcodone-acetaminophen (NORCO/VICODIN) 5-325 MG tablet Take 1 tablet by mouth every 6 (six) hours as needed for moderate pain. 30 tablet 0  . latanoprost (XALATAN) 0.005 % ophthalmic solution INT 1 GTT INTO EACH EYE QHS.  6  . losartan (COZAAR)  25 MG tablet TAKE 1 TABLET EVERY DAY 30 tablet 5  . metoprolol (LOPRESSOR) 100 MG tablet Take 100 mg by mouth 2 (two) times daily.     . nitroGLYCERIN (NITROSTAT) 0.4 MG SL tablet Frequency:PHARMDIR   Dosage:0.0     Instructions:  Note:Q5 MIN  prn, may repeat x 3  prn; disp 1 bottle Dose: 1 TAB    . timolol (TIMOPTIC) 0.25 % ophthalmic solution INSTILL ONE DROP INTO  BOTH EYES EVERY MORNING.  6   No current facility-administered medications for this encounter.    ECOG PERFORMANCE STATUS:  1 - Symptomatic but completely ambulatory  REVIEW OF SYSTEMS:  Patient denies any weight loss, fatigue, weakness, fever, chills or night sweats. Patient denies any loss of vision, blurred vision. Patient denies any ringing  of the ears or hearing loss. No irregular heartbeat. Patient denies heart murmur or history of fainting. Patient denies any chest pain or pain radiating to her upper extremities. Patient denies any shortness of breath, difficulty breathing at night, cough or hemoptysis. Patient denies any swelling in the lower legs. Patient denies any nausea vomiting, vomiting of blood, or coffee ground material in the vomitus. Patient denies any stomach pain. Patient states has had normal bowel movements no significant constipation or diarrhea. Patient denies any dysuria, hematuria or significant nocturia. Patient denies any problems walking, swelling in the joints or loss of balance. Patient denies any skin changes, loss of hair or loss of weight. Patient denies any excessive worrying or anxiety or significant depression. Patient denies any problems with insomnia. Patient denies excessive thirst, polyuria, polydipsia. Patient denies any swollen glands, patient denies easy bruising or easy bleeding. Patient denies any recent infections, allergies or URI. Patient "s visual fields have not changed significantly in recent time.    PHYSICAL EXAM: BP 191/78 mmHg  Pulse 56  Temp(Src) 95.9 F (35.5 C)  Resp 18  Wt 155 lb 6.8 oz (70.5 kg) Well-developed elderly female in NAD. She has a slightly nodular area in the area of excisional biopsy scar in the medial aspect of her right chest wall scar. No other evidence of nodularity or mass is noted in the right chest wall. Left breast is free of dominant mass or nodularity in 2 positions examined. No axillary or supraclavicular  adenopathy is noted. Well-developed well-nourished patient in NAD. HEENT reveals PERLA, EOMI, discs not visualized.  Oral cavity is clear. No oral mucosal lesions are identified. Neck is clear without evidence of cervical or supraclavicular adenopathy. Lungs are clear to A&P. Cardiac examination is essentially unremarkable with regular rate and rhythm without murmur rub or thrill. Abdomen is benign with no organomegaly or masses noted. Motor sensory and DTR levels are equal and symmetric in the upper and lower extremities. Cranial nerves II through XII are grossly intact. Proprioception is intact. No peripheral adenopathy or edema is identified. No motor or sensory levels are noted. Crude visual fields are within normal range.  LABORATORY DATA: Surgical pathology report is reviewed    RADIOLOGY RESULTS: CT scan is reviewed   IMPRESSION: Recurrent breast cancer 14 years after right modified radical mastectomy in 81 year old female with ER/PR positive disease.  PLAN: At this time I have recommended chest wall radiation therapy to her prior right mastectomy scar. I see no evidence of axillary adenopathy is a will not treat her peripheral lymphatics. Would treat her scar to 5000 cGy boosting the exact area of nodular involvement another 2000 cGy using electron beam. Risks and benefits of treatment  including skin reaction, fatigue, alteration of blood counts and inclusion of some superficial lung all were discussed in detail with the patient and her family. She seems to comprehend my treatment plan well. I have set up and ordered CT simulation early next week. Patient also will benefit after completion of treatment from aromatase inhibitor therapy and we will arrange that with medical oncology.  I would like to take this opportunity for allowing me to participate in the care of your patient.Armstead Peaks., MD

## 2015-09-11 ENCOUNTER — Ambulatory Visit
Admission: RE | Admit: 2015-09-11 | Discharge: 2015-09-11 | Disposition: A | Discharge status: Home / Self Care | Payer: Medicare Other | Source: Ambulatory Visit | Attending: Internal Medicine | Admitting: Internal Medicine

## 2015-09-11 DIAGNOSIS — Z780 Asymptomatic menopausal state: Secondary | ICD-10-CM | POA: Diagnosis not present

## 2015-09-11 DIAGNOSIS — M810 Age-related osteoporosis without current pathological fracture: Secondary | ICD-10-CM | POA: Insufficient documentation

## 2015-09-16 ENCOUNTER — Ambulatory Visit: Payer: Medicare Other

## 2015-09-16 ENCOUNTER — Ambulatory Visit
Admission: RE | Admit: 2015-09-16 | Discharge: 2015-09-16 | Disposition: A | Discharge status: Home / Self Care | Payer: Medicare Other | Source: Ambulatory Visit | Attending: Internal Medicine | Admitting: Internal Medicine

## 2015-09-16 ENCOUNTER — Telehealth: Payer: Self-pay | Admitting: *Deleted

## 2015-09-16 ENCOUNTER — Ambulatory Visit
Admission: RE | Admit: 2015-09-16 | Discharge: 2015-09-16 | Disposition: A | Discharge status: Home / Self Care | Payer: Medicare Other | Source: Ambulatory Visit | Attending: Radiation Oncology | Admitting: Radiation Oncology

## 2015-09-16 DIAGNOSIS — I2510 Atherosclerotic heart disease of native coronary artery without angina pectoris: Secondary | ICD-10-CM | POA: Diagnosis not present

## 2015-09-16 DIAGNOSIS — N289 Disorder of kidney and ureter, unspecified: Secondary | ICD-10-CM | POA: Diagnosis not present

## 2015-09-16 DIAGNOSIS — C50211 Malignant neoplasm of upper-inner quadrant of right female breast: Secondary | ICD-10-CM | POA: Diagnosis not present

## 2015-09-16 DIAGNOSIS — Z79899 Other long term (current) drug therapy: Secondary | ICD-10-CM | POA: Diagnosis not present

## 2015-09-16 DIAGNOSIS — Z853 Personal history of malignant neoplasm of breast: Secondary | ICD-10-CM | POA: Diagnosis not present

## 2015-09-16 DIAGNOSIS — Z9011 Acquired absence of right breast and nipple: Secondary | ICD-10-CM | POA: Diagnosis not present

## 2015-09-16 DIAGNOSIS — Z87891 Personal history of nicotine dependence: Secondary | ICD-10-CM | POA: Diagnosis not present

## 2015-09-16 DIAGNOSIS — Z7982 Long term (current) use of aspirin: Secondary | ICD-10-CM | POA: Diagnosis not present

## 2015-09-16 DIAGNOSIS — Q6100 Congenital renal cyst, unspecified: Secondary | ICD-10-CM | POA: Insufficient documentation

## 2015-09-16 DIAGNOSIS — C7989 Secondary malignant neoplasm of other specified sites: Secondary | ICD-10-CM | POA: Diagnosis not present

## 2015-09-16 DIAGNOSIS — N281 Cyst of kidney, acquired: Secondary | ICD-10-CM | POA: Diagnosis not present

## 2015-09-16 DIAGNOSIS — Z510 Encounter for antineoplastic radiation therapy: Secondary | ICD-10-CM | POA: Diagnosis not present

## 2015-09-16 DIAGNOSIS — Z170 Estrogen receptor positive status [ER+]: Secondary | ICD-10-CM | POA: Diagnosis not present

## 2015-09-16 DIAGNOSIS — I10 Essential (primary) hypertension: Secondary | ICD-10-CM | POA: Diagnosis not present

## 2015-09-16 DIAGNOSIS — Z809 Family history of malignant neoplasm, unspecified: Secondary | ICD-10-CM | POA: Diagnosis not present

## 2015-09-16 DIAGNOSIS — E7800 Pure hypercholesterolemia, unspecified: Secondary | ICD-10-CM | POA: Diagnosis not present

## 2015-09-16 NOTE — Telephone Encounter (Signed)
I spoke with Dr Baruch Gouty who said it will not be a problem, but to stop taking the Arimidex. I called Donna Santos back and informed him of this and he repeated it back to me. He then was asking about when they would get results of the BMD. I told him that he can get results form [facility] or [facility] when she comes in next week

## 2015-09-16 NOTE — Telephone Encounter (Signed)
She got her arimidex filled and was not to start taking it until after her XRT, but she got confused and has been taking this week thinking it was to prepare her for radiation. Asking if this is going to cause a problem. Please call.

## 2015-09-16 NOTE — Telephone Encounter (Signed)
I will speak to Dr. Rogue Bussing about this concern tomorrow. The Bone density scan demonstrated that the patient is a high risk for fractures.  If she is not already taking calcium +D, she needs to start asap to strengthen her bones. Hassan Rowan to call pt's son back to explain this.  Since patient's appointment is scheduled for [place] next week at 11am and the radiation is scheduled for 3:15pm the same day, we can easily coordinate the return visits in [place] and see the pt at the same time.  This may make the appointment transport easier for the patient and on the patient's family.

## 2015-09-18 ENCOUNTER — Other Ambulatory Visit: Payer: Self-pay | Admitting: *Deleted

## 2015-09-18 DIAGNOSIS — Z7982 Long term (current) use of aspirin: Secondary | ICD-10-CM | POA: Diagnosis not present

## 2015-09-18 DIAGNOSIS — C50211 Malignant neoplasm of upper-inner quadrant of right female breast: Secondary | ICD-10-CM | POA: Diagnosis not present

## 2015-09-18 DIAGNOSIS — I10 Essential (primary) hypertension: Secondary | ICD-10-CM | POA: Diagnosis not present

## 2015-09-18 DIAGNOSIS — E7800 Pure hypercholesterolemia, unspecified: Secondary | ICD-10-CM | POA: Diagnosis not present

## 2015-09-18 DIAGNOSIS — Z87891 Personal history of nicotine dependence: Secondary | ICD-10-CM | POA: Diagnosis not present

## 2015-09-18 DIAGNOSIS — Z510 Encounter for antineoplastic radiation therapy: Secondary | ICD-10-CM | POA: Diagnosis not present

## 2015-09-18 DIAGNOSIS — Z9011 Acquired absence of right breast and nipple: Secondary | ICD-10-CM | POA: Diagnosis not present

## 2015-09-18 DIAGNOSIS — N289 Disorder of kidney and ureter, unspecified: Secondary | ICD-10-CM | POA: Diagnosis not present

## 2015-09-18 DIAGNOSIS — Z853 Personal history of malignant neoplasm of breast: Secondary | ICD-10-CM | POA: Diagnosis not present

## 2015-09-18 DIAGNOSIS — C7989 Secondary malignant neoplasm of other specified sites: Secondary | ICD-10-CM | POA: Diagnosis not present

## 2015-09-18 DIAGNOSIS — Z79899 Other long term (current) drug therapy: Secondary | ICD-10-CM | POA: Diagnosis not present

## 2015-09-18 DIAGNOSIS — Z170 Estrogen receptor positive status [ER+]: Secondary | ICD-10-CM | POA: Diagnosis not present

## 2015-09-18 DIAGNOSIS — I259 Chronic ischemic heart disease, unspecified: Secondary | ICD-10-CM | POA: Diagnosis not present

## 2015-09-18 DIAGNOSIS — Z809 Family history of malignant neoplasm, unspecified: Secondary | ICD-10-CM | POA: Diagnosis not present

## 2015-09-18 DIAGNOSIS — I2510 Atherosclerotic heart disease of native coronary artery without angina pectoris: Secondary | ICD-10-CM | POA: Diagnosis not present

## 2015-09-22 ENCOUNTER — Emergency Department: Payer: Medicare Other

## 2015-09-22 ENCOUNTER — Emergency Department
Admission: EM | Admit: 2015-09-22 | Discharge: 2015-09-22 | Disposition: A | Discharge status: Home / Self Care | Payer: Medicare Other | Attending: Emergency Medicine | Admitting: Emergency Medicine

## 2015-09-22 ENCOUNTER — Telehealth: Payer: Self-pay | Admitting: *Deleted

## 2015-09-22 DIAGNOSIS — N390 Urinary tract infection, site not specified: Secondary | ICD-10-CM | POA: Diagnosis not present

## 2015-09-22 DIAGNOSIS — M25552 Pain in left hip: Secondary | ICD-10-CM | POA: Diagnosis not present

## 2015-09-22 DIAGNOSIS — Z7982 Long term (current) use of aspirin: Secondary | ICD-10-CM | POA: Insufficient documentation

## 2015-09-22 DIAGNOSIS — Z87891 Personal history of nicotine dependence: Secondary | ICD-10-CM | POA: Diagnosis not present

## 2015-09-22 DIAGNOSIS — Z79899 Other long term (current) drug therapy: Secondary | ICD-10-CM | POA: Diagnosis not present

## 2015-09-22 DIAGNOSIS — I10 Essential (primary) hypertension: Secondary | ICD-10-CM | POA: Insufficient documentation

## 2015-09-22 DIAGNOSIS — M549 Dorsalgia, unspecified: Secondary | ICD-10-CM | POA: Diagnosis not present

## 2015-09-22 LAB — URINALYSIS COMPLETE WITH MICROSCOPIC (ARMC ONLY)
BILIRUBIN URINE: NEGATIVE
Glucose, UA: NEGATIVE mg/dL
HGB URINE DIPSTICK: NEGATIVE
Ketones, ur: NEGATIVE mg/dL
Nitrite: NEGATIVE
PH: 7 (ref 5.0–8.0)
Protein, ur: NEGATIVE mg/dL
Specific Gravity, Urine: 1.013 (ref 1.005–1.030)

## 2015-09-22 MED ORDER — KETOROLAC TROMETHAMINE 30 MG/ML IJ SOLN
60.0000 mg | Freq: Once | INTRAMUSCULAR | Status: AC
  Administered 2015-09-22: 60 mg via INTRAMUSCULAR
  Filled 2015-09-22: qty 2

## 2015-09-22 MED ORDER — SULFAMETHOXAZOLE-TRIMETHOPRIM 800-160 MG PO TABS: 1.0000 | ORAL_TABLET | Freq: Two times a day (BID) | ORAL | Status: AC

## 2015-09-22 NOTE — ED Provider Notes (Signed)
[facility] Emergency Department Provider Note  ____________________________________________  Time seen: Approximately 10:19 AM  I have reviewed the triage vital signs and the nursing notes.   HISTORY  Chief Complaint Left hip pain.  HPI Donna Santos is a 81 y.o. female currently being treated for breast cancer presenting with left hip pain.Patient reports that she has been her usual state of health without any acute medical conditions over the last several days. She woke up at 4 AM with a severe left hip pain and upper left buttock pain. She describes a "severe dull ache." It was difficult for her to move her leg, stand up, or walk. She denies any trauma, fevers or chills, nausea or vomiting, diarrhea, numbness, tingling, weakness, or left leg swelling.  No urinary or fecal incontinence or retention.  She received 1000 mg of Tylenol by EMS and her pain is significantly better at this time. Per her son, she has also had some minimal short-term memory loss issues over the last several weeks. She is scheduled to initiate radiation tomorrow and is not currently on chemotherapy..   Past Medical History  Diagnosis Date  . Impaired renal function   . Arteriosclerosis of coronary artery   . Essential hypertension   . Malignant neoplasm of breast ([place])   . Muscle spasms of neck   . Arthritis of shoulder region   . Tobacco abuse   . Hypercholesteremia   . Breast cancer ([place])   . Cataract     Patient Active Problem List   Diagnosis Date Noted  . Chest wall recurrence of right breast cancer ([place]) 09/09/2015  . Impaired renal function 05/09/2015  . Arteriosclerosis of coronary artery 05/09/2015  . Essential (primary) hypertension 05/09/2015  . Personal history of malignant neoplasm of breast 05/09/2015  . Muscle spasms of neck 05/09/2015  . Arthritis of shoulder region, degenerative 05/09/2015  . Current tobacco use 05/09/2015  . Hypercholesteremia 04/20/2012   . Carotid artery disease ([place]) 05/28/2005   No history of aortic aneurysm   Past Surgical History  Procedure Laterality Date  . Coronary artery bypass graft  2006  . Mastectomy, radical Right 2002  . Total abdominal hysterectomy    . Carotid endarterectomy    . Mass excision Right 08/28/2015    Procedure: EXCISION MASS; remove massive right chest wall;  Surgeon: Florene Glen, MD;  Location: [facility] ORS;  Service: General;  Laterality: Right;    Current Outpatient Rx  Name  Route  Sig  Dispense  Refill  . amLODipine (NORVASC) 5 MG tablet   Oral   Take 1 tablet by mouth 2 (two) times daily.          Marland Kitchen anastrozole (ARIMIDEX) 1 MG tablet   Oral   Take 1 tablet (1 mg total) by mouth daily. Patient taking differently: Take 1 mg by mouth daily. (hold until after radiation)   90 tablet   3   . aspirin EC 81 MG tablet   Oral   Take 81 mg by mouth daily.         . Calcium Carb-Cholecalciferol (CALCIUM 600+D) 600-800 MG-UNIT TABS   Oral   Take 1 tablet by mouth daily.         . chlorthalidone (HYGROTON) 25 MG tablet   Oral   Take 1 tablet by mouth daily.         . cholecalciferol (VITAMIN D) 1000 UNITS tablet   Oral   Take 1,000 Units by mouth daily.         Marland Kitchen  HYDROcodone-acetaminophen (NORCO/VICODIN) 5-325 MG tablet   Oral   Take 1 tablet by mouth every 6 (six) hours as needed for moderate pain.   30 tablet   0   . latanoprost (XALATAN) 0.005 % ophthalmic solution      INT 1 GTT INTO EACH EYE QHS.      6   . losartan (COZAAR) 25 MG tablet      TAKE 1 TABLET EVERY DAY   30 tablet   5   . metoprolol (LOPRESSOR) 100 MG tablet   Oral   Take 100 mg by mouth 2 (two) times daily.          . nitroGLYCERIN (NITROSTAT) 0.4 MG SL tablet   Sublingual   Place 0.4 mg under the tongue every 5 (five) minutes as needed for chest pain.         Marland Kitchen timolol (TIMOPTIC) 0.25 % ophthalmic solution      INSTILL ONE DROP INTO BOTH EYES EVERY MORNING.      6   .  sulfamethoxazole-trimethoprim (BACTRIM DS,SEPTRA DS) 800-160 MG tablet   Oral   Take 1 tablet by mouth 2 (two) times daily.   6 tablet   0     Allergies Ace inhibitors  Family History  Problem Relation Age of Onset  . Hypertension Father   . Cancer Father     Social History Social History  Substance Use Topics  . Smoking status: Former Research scientist (life sciences)  . Smokeless tobacco: Never Used  . Alcohol Use: No    Review of Systems Constitutional: No fever/chills. No syncope. No fall. Eyes: No visual changes. ENT: No sore throat. Cardiovascular: Denies chest pain, palpitations. Respiratory: Denies shortness of breath.  No cough. Gastrointestinal: No abdominal pain.  No nausea, no vomiting.  No diarrhea.  No constipation. Genitourinary: Negative for dysuria. No pain or burning with urination, no changes in urinary frequency, no foul-smelling urine. Musculoskeletal: Left upper buttock and left hip pain. No midline upper or lower back pain. Skin: Negative for rash. Neurological: Negative for headaches, focal weakness or numbness. No tingling. Pain with walking but no difficulty with balance. No saddle anesthesia. No urinary or fecal incontinence or retention.  10-point ROS otherwise negative.  ____________________________________________   PHYSICAL EXAM:  VITAL SIGNS: ED Triage Vitals  Enc Vitals Group     BP 09/22/15 1004 179/53 mmHg     Pulse Rate 09/22/15 1004 62     Resp 09/22/15 1004 16     Temp 09/22/15 1004 99 F (37.2 C)     Temp Source 09/22/15 1004 Oral     SpO2 09/22/15 1004 99 %     Weight 09/22/15 1004 159 lb (72.122 kg)     Height 09/22/15 1004 5' (1.524 m)     Head Cir --      Peak Flow --      Pain Score 09/22/15 1006 3     Pain Loc --      Pain Edu? --      Excl. in [place]? --     Constitutional: Alert and oriented. Well appearing and in no acute distress. Answer question appropriately. Eyes: Conjunctivae are normal.  EOMI. no scleral icterus Head:  Atraumatic.  Nose: No congestion/rhinnorhea. Mouth/Throat: Mucous membranes are moist.  Neck: No stridor.  Supple.  Full range of motion without any midline C-spine tenderness to palpation. Cardiovascular: Normal rate, regular rhythm. No murmurs, rubs or gallops. Chest is status post mastectomy on the right. Respiratory: Normal respiratory effort.  No retractions. Lungs CTAB.  No wheezes, rales or ronchi. Gastrointestinal: Soft and nontender. No distention. No peritoneal signs. No rebound or guarding. Musculoskeletal: No midline T or L-spine tenderness to palpation. No step-offs or deformities. Mild tenderness to palpation over the left greater trochanter with some pain but full range of motion ability. Range of motion of the left hip reproduces the patient's pain. Full range of motion of the left knee and left ankle without pain. Normal DP and PT pulses bilaterally.  Neurologic:  Normal speech and language. 5 out of 5 bilateral hip flexors, dorsiflexion and plantarflexion. Normal sensation to light touch in the bilateral lower extremities. Skin:  Skin is warm, dry and intact. No rash noted. Psychiatric: Mood and affect are normal. Speech and behavior are normal.  Normal judgement.  ____________________________________________   LABS (all labs ordered are listed, but only abnormal results are displayed)  Labs Reviewed  URINALYSIS COMPLETEWITH MICROSCOPIC ([place]) - Abnormal; Notable for the following:    Color, Urine YELLOW (*)    APPearance CLEAR (*)    Leukocytes, UA 3+ (*)    Bacteria, UA RARE (*)    Squamous Epithelial / LPF 0-5 (*)    All other components within normal limits   ____________________________________________  EKG  Not indicated ____________________________________________  RADIOLOGY  Dg Hip Unilat With Pelvis 2-3 Views Left  09/22/2015  CLINICAL DATA:  Left hip pain beginning this morning. No known injury. Initial encounter. EXAM: DG HIP (WITH OR WITHOUT  PELVIS) 2-3V LEFT COMPARISON:  None. FINDINGS: No acute bony or joint abnormality is identified. Mild to moderate degenerative change is seen about both hips. Bones are somewhat osteopenic. No focal bony lesion is seen. Soft tissues are unremarkable. IMPRESSION: No acute abnormality. Mild to moderate degenerative disease about the hips appear symmetric from right to left. Osteopenia. Electronically Signed   By: Inge Rise M.D.   On: 09/22/2015 10:56    ____________________________________________   PROCEDURES  Procedure(s) performed: None  Critical Care performed: No ____________________________________________   INITIAL IMPRESSION / ASSESSMENT AND PLAN / ED COURSE  Pertinent labs & imaging results that were available during my care of the patient were reviewed by me and considered in my medical decision making (see chart for details).  81 y.o. female currently being treated for breast cancer presenting with left hip pain. The patient has no focal neurologic deficits. She has not had any trauma so fracture is unlikely and she clinically does not have dislocation. I will however get an x-ray given that she is being treated for cancer in case there are any lytic lesions on the bone. I do not think the patient is having an aortic dissection given the constellation of her symptoms. We will rule out UTI. Consider trochanteric bursitis. Consider metastatic disease to the bone. Plan x-ray of the pelvis and left hip, UA, and symptomatically treatment at this time, the patient has parties significantly improved and her symptoms is only having pain with range of motion of the hip as opposed to at rest which she had at home.  ----------------------------------------- 12:26 PM on 09/22/2015 -----------------------------------------  The patient's pain is significantly improved. Her x-ray does not show any acute bony injury. The patient has rare bacteria and a few leukocyte esterase in her urine  but given the mental status changes I will treat her for urinary tract infection in case this is what is going on. She'll follow-up with her primary care physician. I have discussed the follow-up and return instructions with  both the patient and her son.  ____________________________________________  FINAL CLINICAL IMPRESSION(S) / ED DIAGNOSES  Final diagnoses:  Left hip pain  UTI (lower urinary tract infection)      NEW MEDICATIONS STARTED DURING THIS VISIT:  New Prescriptions   SULFAMETHOXAZOLE-TRIMETHOPRIM (BACTRIM DS,SEPTRA DS) 800-160 MG TABLET    Take 1 tablet by mouth 2 (two) times daily.     Eula Listen, MD 09/22/15 1226

## 2015-09-22 NOTE — Discharge Instructions (Signed)
For your hip pain, you may take Tylenol or Motrin.  For your urinary tract infection, please take the entire course of antibiotics even if you're feeling better.   Please make a follow-up appointment with your primary care physician.   Please return to the emergency department if you have increased pain, fever, inability to walk, numbness, tingling, lightheadedness or fainting, or any other symptoms concerning to you.

## 2015-09-22 NOTE — ED Notes (Signed)
Patient in via [facility] for non traumatic back pain that started at 4am.  Patient reports taking no medications at home for pain.  Patient given Tylenol 1000 mg PO by EMS.

## 2015-09-22 NOTE — Telephone Encounter (Signed)
Called to report that they have called EMS. Patient has been in terrible pain at kidney all weekend and she is unable to tolerate it any longer. She lives in [place] and may have to be taken to [place] if they will not transport across [employer]. They will attempt to get them to transport her to [facility] ER

## 2015-09-23 ENCOUNTER — Ambulatory Visit
Admission: RE | Admit: 2015-09-23 | Discharge: 2015-09-23 | Disposition: A | Discharge status: Home / Self Care | Payer: Medicare Other | Source: Ambulatory Visit | Attending: Radiation Oncology | Admitting: Radiation Oncology

## 2015-09-23 ENCOUNTER — Ambulatory Visit: Payer: Medicare Other | Admitting: Internal Medicine

## 2015-09-23 DIAGNOSIS — C50211 Malignant neoplasm of upper-inner quadrant of right female breast: Secondary | ICD-10-CM | POA: Diagnosis not present

## 2015-09-23 DIAGNOSIS — Z853 Personal history of malignant neoplasm of breast: Secondary | ICD-10-CM | POA: Diagnosis not present

## 2015-09-23 DIAGNOSIS — E7800 Pure hypercholesterolemia, unspecified: Secondary | ICD-10-CM | POA: Diagnosis not present

## 2015-09-23 DIAGNOSIS — Z9011 Acquired absence of right breast and nipple: Secondary | ICD-10-CM | POA: Diagnosis not present

## 2015-09-23 DIAGNOSIS — C7989 Secondary malignant neoplasm of other specified sites: Secondary | ICD-10-CM | POA: Diagnosis not present

## 2015-09-23 DIAGNOSIS — Z79899 Other long term (current) drug therapy: Secondary | ICD-10-CM | POA: Diagnosis not present

## 2015-09-23 DIAGNOSIS — Z809 Family history of malignant neoplasm, unspecified: Secondary | ICD-10-CM | POA: Diagnosis not present

## 2015-09-23 DIAGNOSIS — I2510 Atherosclerotic heart disease of native coronary artery without angina pectoris: Secondary | ICD-10-CM | POA: Diagnosis not present

## 2015-09-23 DIAGNOSIS — Z510 Encounter for antineoplastic radiation therapy: Secondary | ICD-10-CM | POA: Diagnosis not present

## 2015-09-23 DIAGNOSIS — Z7982 Long term (current) use of aspirin: Secondary | ICD-10-CM | POA: Diagnosis not present

## 2015-09-23 DIAGNOSIS — I10 Essential (primary) hypertension: Secondary | ICD-10-CM | POA: Diagnosis not present

## 2015-09-23 DIAGNOSIS — Z87891 Personal history of nicotine dependence: Secondary | ICD-10-CM | POA: Diagnosis not present

## 2015-09-23 DIAGNOSIS — Z170 Estrogen receptor positive status [ER+]: Secondary | ICD-10-CM | POA: Diagnosis not present

## 2015-09-23 DIAGNOSIS — N289 Disorder of kidney and ureter, unspecified: Secondary | ICD-10-CM | POA: Diagnosis not present

## 2015-09-24 ENCOUNTER — Encounter: Payer: Self-pay | Admitting: Internal Medicine

## 2015-09-24 ENCOUNTER — Ambulatory Visit
Admission: RE | Admit: 2015-09-24 | Discharge: 2015-09-24 | Disposition: A | Discharge status: Home / Self Care | Payer: Medicare Other | Source: Ambulatory Visit | Attending: Radiation Oncology | Admitting: Radiation Oncology

## 2015-09-24 ENCOUNTER — Inpatient Hospital Stay (HOSPITAL_BASED_OUTPATIENT_CLINIC_OR_DEPARTMENT_OTHER): Payer: Medicare Other | Admitting: Internal Medicine

## 2015-09-24 VITALS — BP 149/69 | HR 53 | Temp 97.0°F | Resp 18 | Ht 60.0 in | Wt 155.4 lb

## 2015-09-24 DIAGNOSIS — Z79899 Other long term (current) drug therapy: Secondary | ICD-10-CM

## 2015-09-24 DIAGNOSIS — C50911 Malignant neoplasm of unspecified site of right female breast: Secondary | ICD-10-CM

## 2015-09-24 DIAGNOSIS — I10 Essential (primary) hypertension: Secondary | ICD-10-CM

## 2015-09-24 DIAGNOSIS — Z170 Estrogen receptor positive status [ER+]: Secondary | ICD-10-CM | POA: Diagnosis not present

## 2015-09-24 DIAGNOSIS — R918 Other nonspecific abnormal finding of lung field: Secondary | ICD-10-CM

## 2015-09-24 DIAGNOSIS — I2510 Atherosclerotic heart disease of native coronary artery without angina pectoris: Secondary | ICD-10-CM

## 2015-09-24 DIAGNOSIS — Z7982 Long term (current) use of aspirin: Secondary | ICD-10-CM

## 2015-09-24 DIAGNOSIS — C7981 Secondary malignant neoplasm of breast: Secondary | ICD-10-CM | POA: Diagnosis not present

## 2015-09-24 DIAGNOSIS — Z9011 Acquired absence of right breast and nipple: Secondary | ICD-10-CM | POA: Diagnosis not present

## 2015-09-24 DIAGNOSIS — Z87891 Personal history of nicotine dependence: Secondary | ICD-10-CM

## 2015-09-24 DIAGNOSIS — K869 Disease of pancreas, unspecified: Secondary | ICD-10-CM | POA: Diagnosis not present

## 2015-09-24 DIAGNOSIS — M129 Arthropathy, unspecified: Secondary | ICD-10-CM | POA: Diagnosis not present

## 2015-09-24 DIAGNOSIS — E785 Hyperlipidemia, unspecified: Secondary | ICD-10-CM

## 2015-09-24 DIAGNOSIS — E7800 Pure hypercholesterolemia, unspecified: Secondary | ICD-10-CM | POA: Diagnosis not present

## 2015-09-24 DIAGNOSIS — N281 Cyst of kidney, acquired: Secondary | ICD-10-CM

## 2015-09-24 DIAGNOSIS — C7989 Secondary malignant neoplasm of other specified sites: Secondary | ICD-10-CM | POA: Diagnosis not present

## 2015-09-24 DIAGNOSIS — Z853 Personal history of malignant neoplasm of breast: Secondary | ICD-10-CM | POA: Diagnosis not present

## 2015-09-24 DIAGNOSIS — C50211 Malignant neoplasm of upper-inner quadrant of right female breast: Secondary | ICD-10-CM | POA: Diagnosis not present

## 2015-09-24 DIAGNOSIS — Z9013 Acquired absence of bilateral breasts and nipples: Secondary | ICD-10-CM | POA: Diagnosis not present

## 2015-09-24 DIAGNOSIS — N289 Disorder of kidney and ureter, unspecified: Secondary | ICD-10-CM | POA: Diagnosis not present

## 2015-09-24 DIAGNOSIS — Z809 Family history of malignant neoplasm, unspecified: Secondary | ICD-10-CM | POA: Diagnosis not present

## 2015-09-24 DIAGNOSIS — K449 Diaphragmatic hernia without obstruction or gangrene: Secondary | ICD-10-CM | POA: Diagnosis not present

## 2015-09-24 DIAGNOSIS — I517 Cardiomegaly: Secondary | ICD-10-CM | POA: Diagnosis not present

## 2015-09-24 DIAGNOSIS — Z510 Encounter for antineoplastic radiation therapy: Secondary | ICD-10-CM | POA: Diagnosis not present

## 2015-09-24 NOTE — Progress Notes (Signed)
Pt here and had first radiaiton and to see dr. B today

## 2015-09-24 NOTE — Progress Notes (Signed)
[place] OFFICE PROGRESS NOTE  Patient Care Team: Glean Hess, MD as PCP - General (Family Medicine)   SUMMARY OF ONCOLOGIC HISTORY:  # 2002- Right Breast" Predominant DCIS- cribriform/low grade; 1.5cm with 5% early invasion"; ER 74%; PR-NEG; her 2 Neg; S/P Mastec & ALND [no residual ca/dcis] & Recon]; NO TAM  # 2016- CHEST WALL RECURRENCE [~2.5CM] s/p RIGHT MASTEC [ Dr.Cooper] Cribriform type; ER/PR >90%; HER 2 neu-NEG; Recm RT; Arimidex  # Lung nodule [61m LL; Oct 2016]; Left kidney ? 2.8cm Complex Cyst- [place]-NEG for mass; Pancreas 10 mm cyst  # OSTEOPOROSIS [BMD, OCT 2016]   INTERVAL HISTORY:  [place] 81y.o. female patient with a recent diagnosis of isolated chest wall recurrence/not resectable ER/PR positive HER-2/neu negative is here to review the plan/imaging results.  In the interim patient has met with radiation oncology; started radiation to the chest wall recurrence. She is tolerating it very well.  Patient denies any symptoms at this time.  REVIEW OF SYSTEMS:  A complete 10 point review of system is done which is negative except mentioned above/history of present illness.   PAST MEDICAL HISTORY :  Past Medical History  Diagnosis Date  . Impaired renal function   . Arteriosclerosis of coronary artery   . Essential hypertension   . Malignant ne[place] of breast ([place]   . Muscle spasms of neck   . Arthritis of shoulder region   . Tobacco abuse   . Hypercholesteremia   . [place]   . Cataract     PAST SURGICAL HISTORY :   Past Surgical History  Procedure Laterality Date  . Coronary artery bypass graft  2006  . Mastectomy, radical Right 2002  . Total abdominal hysterectomy    . Carotid endarterectomy    . Mass excision Right 08/28/2015    Procedure: EXCISION MASS; remove massive right chest wall;  Surgeon: RFlorene Glen MD;  Location: [facility] ORS;  Service: General;  Laterality: Right;    FAMILY HISTORY :    Family History  Problem Relation Age of Onset  . Hypertension Father   . Cancer Father     SOCIAL HISTORY:   Social History  Substance Use Topics  . Smoking status: Former Smoker    Quit date: 10/04/1952  . Smokeless tobacco: Never Used  . Alcohol Use: No    ALLERGIES:  is allergic to ace inhibitors.  MEDICATIONS:  Current Outpatient Prescriptions  Medication Sig Dispense Refill  . amLODipine (NORVASC) 5 MG tablet Take 1 tablet by mouth 2 (two) times daily.     .Marland Kitchenaspirin EC 81 MG tablet Take 81 mg by mouth daily.    . Calcium Carb-Cholecalciferol (CALCIUM 600+D) 600-800 MG-UNIT TABS Take 1 tablet by mouth daily.    . chlorthalidone (HYGROTON) 25 MG tablet Take 1 tablet by mouth daily.    . cholecalciferol (VITAMIN D) 1000 UNITS tablet Take 1,000 Units by mouth daily.    .Marland KitchenHYDROcodone-acetaminophen (NORCO/VICODIN) 5-325 MG tablet Take 1 tablet by mouth every 6 (six) hours as needed for moderate pain. 30 tablet 0  . latanoprost (XALATAN) 0.005 % ophthalmic solution INT 1 GTT INTO EACH EYE QHS.  6  . losartan (COZAAR) 25 MG tablet TAKE 1 TABLET EVERY DAY 30 tablet 5  . metoprolol (LOPRESSOR) 100 MG tablet Take 100 mg by mouth 2 (two) times daily.     . nitroGLYCERIN (NITROSTAT) 0.4 MG SL tablet Place 0.4 mg under the tongue every 5 (  five) minutes as needed for chest pain.    Marland Kitchen sulfamethoxazole-trimethoprim (BACTRIM DS,SEPTRA DS) 800-160 MG tablet Take 1 tablet by mouth 2 (two) times daily. 6 tablet 0  . timolol (TIMOPTIC) 0.25 % ophthalmic solution INSTILL ONE DROP INTO BOTH EYES EVERY MORNING.  6  . anastrozole (ARIMIDEX) 1 MG tablet Take 1 tablet (1 mg total) by mouth daily. (Patient not taking: Reported on 09/24/2015) 90 tablet 3   No current facility-administered medications for this visit.    PHYSICAL EXAMINATION: ECOG PERFORMANCE STATUS: 0 - Asymptomatic  BP 149/69 mmHg  Pulse 53  Temp(Src) 97 F (36.1 C) (Tympanic)  Resp 18  Ht 5' (1.524 m)  Wt 155 lb 6.8 oz (70.5  kg)  BMI 30.35 kg/m2  Filed Weights   09/24/15 1142  Weight: 155 lb 6.8 oz (70.5 kg)    GENERAL: Well-nourished well-developed; Alert, no distress and comfortable.   Accompanied by her son. She is walking herself. EYES: no pallor or icterus OROPHARYNX: no thrush or ulceration; good dentition  NECK: supple, no masses felt LYMPH:  no palpable lymphadenopathy in the cervical, axillary or inguinal regions LUNGS: clear to auscultation and  No wheeze or crackles HEART/CVS: regular rate & rhythm and no murmurs; No lower extremity edema ABDOMEN:abdomen soft, non-tender and normal bowel sounds Musculoskeletal:no cyanosis of digits and no clubbing  PSYCH: alert & oriented x 3 with fluent speech NEURO: no focal motor/sensory deficits SKIN:  no rashes or significant lesions  LABORATORY DATA:  I have reviewed the data as listed    Component Value Date/Time   NA 137 08/28/2015 0827   NA 138 07/31/2015 1024   K 3.6 08/28/2015 0827   CL 95* 08/27/2015 1304   CO2 28 08/27/2015 1304   GLUCOSE 131* 08/28/2015 0827   GLUCOSE 124* 07/31/2015 1024   BUN 18 08/27/2015 1304   BUN 18 07/31/2015 1024   CREATININE 1.01* 08/27/2015 1304   CALCIUM 9.5 08/27/2015 1304   PROT 7.4 07/31/2015 1024   ALBUMIN 4.5 07/31/2015 1024   AST 15 07/31/2015 1024   ALT 12 07/31/2015 1024   ALKPHOS 44 07/31/2015 1024   BILITOT 0.4 07/31/2015 1024   GFRNONAA 48* 08/27/2015 1304   GFRAA 56* 08/27/2015 1304    No results found for: SPEP, UPEP  Lab Results  Component Value Date   WBC 10.6 08/27/2015   NEUTROABS 8.0* 08/27/2015   HGB 14.6 08/28/2015   HCT 43.0 08/28/2015   MCV 91.7 08/27/2015   PLT 357 08/27/2015      Chemistry      Component Value Date/Time   NA 137 08/28/2015 0827   NA 138 07/31/2015 1024   K 3.6 08/28/2015 0827   CL 95* 08/27/2015 1304   CO2 28 08/27/2015 1304   BUN 18 08/27/2015 1304   BUN 18 07/31/2015 1024   CREATININE 1.01* 08/27/2015 1304      Component Value Date/Time    CALCIUM 9.5 08/27/2015 1304   ALKPHOS 44 07/31/2015 1024   AST 15 07/31/2015 1024   ALT 12 07/31/2015 1024   BILITOT 0.4 07/31/2015 1024       RADIOGRAPHIC STUDIES: I have personally reviewed the radiological images as listed and agreed with the findings in the report. No results found.   ASSESSMENT & PLAN:   # RIGHT IPSILATERAL CHEST WALL RECURRENCE STATUS post mastectomy 2002- Cribiform type; ER/PR >90%; Her2 Neu-NEG. As per my discussion with Dr. Burt Knack no a great option. She has met with radiation oncology and currently  started on radiation to the tumor recurrence in the chest wall.  # From systemic therapy standpoint would start the patient on Arimidex; when she is done with her radiation.  # Bone density test shows osteoporosis; recommend vitamin D calcium and Fosamax [defer to PCP]; also discussed the role of Prolia q 6 months subcutaneous. She was given a copy of the bone density.  # Cysts in the kidney incidental finding-October 2016- ultrasound shows no concerns for any malignancy. She was given a copy of the ultrasound.   No orders of the defined types were placed in this encounter.   All questions were answered. The patient knows to call the clinic with any problems, questions or concerns. No barriers to learning was detected.  I spent 25 minutes counseling the patient face to face. The total time spent in the appointment was 30 minutes and more than 50% was on counseling and review of test results     Cammie Sickle, MD 09/24/2015 12:03 PM

## 2015-09-25 ENCOUNTER — Other Ambulatory Visit: Payer: Self-pay | Admitting: Internal Medicine

## 2015-09-25 ENCOUNTER — Ambulatory Visit
Admission: RE | Admit: 2015-09-25 | Discharge: 2015-09-25 | Disposition: A | Discharge status: Home / Self Care | Payer: Medicare Other | Source: Ambulatory Visit | Attending: Radiation Oncology | Admitting: Radiation Oncology

## 2015-09-25 DIAGNOSIS — Z87891 Personal history of nicotine dependence: Secondary | ICD-10-CM | POA: Diagnosis not present

## 2015-09-25 DIAGNOSIS — N289 Disorder of kidney and ureter, unspecified: Secondary | ICD-10-CM | POA: Diagnosis not present

## 2015-09-25 DIAGNOSIS — Z79899 Other long term (current) drug therapy: Secondary | ICD-10-CM | POA: Diagnosis not present

## 2015-09-25 DIAGNOSIS — I10 Essential (primary) hypertension: Secondary | ICD-10-CM | POA: Diagnosis not present

## 2015-09-25 DIAGNOSIS — Z170 Estrogen receptor positive status [ER+]: Secondary | ICD-10-CM | POA: Diagnosis not present

## 2015-09-25 DIAGNOSIS — Z7982 Long term (current) use of aspirin: Secondary | ICD-10-CM | POA: Diagnosis not present

## 2015-09-25 DIAGNOSIS — C7989 Secondary malignant neoplasm of other specified sites: Secondary | ICD-10-CM | POA: Diagnosis not present

## 2015-09-25 DIAGNOSIS — Z510 Encounter for antineoplastic radiation therapy: Secondary | ICD-10-CM | POA: Diagnosis not present

## 2015-09-25 DIAGNOSIS — E7800 Pure hypercholesterolemia, unspecified: Secondary | ICD-10-CM | POA: Diagnosis not present

## 2015-09-25 DIAGNOSIS — Z809 Family history of malignant neoplasm, unspecified: Secondary | ICD-10-CM | POA: Diagnosis not present

## 2015-09-25 DIAGNOSIS — I2510 Atherosclerotic heart disease of native coronary artery without angina pectoris: Secondary | ICD-10-CM | POA: Diagnosis not present

## 2015-09-25 DIAGNOSIS — Z9011 Acquired absence of right breast and nipple: Secondary | ICD-10-CM | POA: Diagnosis not present

## 2015-09-25 DIAGNOSIS — Z853 Personal history of malignant neoplasm of breast: Secondary | ICD-10-CM | POA: Diagnosis not present

## 2015-09-26 ENCOUNTER — Ambulatory Visit
Admission: RE | Admit: 2015-09-26 | Discharge: 2015-09-26 | Disposition: A | Discharge status: Home / Self Care | Payer: Medicare Other | Source: Ambulatory Visit | Attending: Radiation Oncology | Admitting: Radiation Oncology

## 2015-09-26 DIAGNOSIS — Z853 Personal history of malignant neoplasm of breast: Secondary | ICD-10-CM | POA: Diagnosis not present

## 2015-09-26 DIAGNOSIS — Z79899 Other long term (current) drug therapy: Secondary | ICD-10-CM | POA: Diagnosis not present

## 2015-09-26 DIAGNOSIS — Z170 Estrogen receptor positive status [ER+]: Secondary | ICD-10-CM | POA: Diagnosis not present

## 2015-09-26 DIAGNOSIS — Z809 Family history of malignant neoplasm, unspecified: Secondary | ICD-10-CM | POA: Diagnosis not present

## 2015-09-26 DIAGNOSIS — N289 Disorder of kidney and ureter, unspecified: Secondary | ICD-10-CM | POA: Diagnosis not present

## 2015-09-26 DIAGNOSIS — I2510 Atherosclerotic heart disease of native coronary artery without angina pectoris: Secondary | ICD-10-CM | POA: Diagnosis not present

## 2015-09-26 DIAGNOSIS — C7989 Secondary malignant neoplasm of other specified sites: Secondary | ICD-10-CM | POA: Diagnosis not present

## 2015-09-26 DIAGNOSIS — E7800 Pure hypercholesterolemia, unspecified: Secondary | ICD-10-CM | POA: Diagnosis not present

## 2015-09-26 DIAGNOSIS — Z510 Encounter for antineoplastic radiation therapy: Secondary | ICD-10-CM | POA: Diagnosis not present

## 2015-09-26 DIAGNOSIS — Z87891 Personal history of nicotine dependence: Secondary | ICD-10-CM | POA: Diagnosis not present

## 2015-09-26 DIAGNOSIS — Z9011 Acquired absence of right breast and nipple: Secondary | ICD-10-CM | POA: Diagnosis not present

## 2015-09-26 DIAGNOSIS — Z7982 Long term (current) use of aspirin: Secondary | ICD-10-CM | POA: Diagnosis not present

## 2015-09-26 DIAGNOSIS — I10 Essential (primary) hypertension: Secondary | ICD-10-CM | POA: Diagnosis not present

## 2015-09-28 ENCOUNTER — Ambulatory Visit
Admission: RE | Admit: 2015-09-28 | Discharge: 2015-09-28 | Disposition: A | Discharge status: Home / Self Care | Payer: Medicare Other | Source: Ambulatory Visit | Attending: Radiation Oncology | Admitting: Radiation Oncology

## 2015-09-29 ENCOUNTER — Ambulatory Visit
Admission: RE | Admit: 2015-09-29 | Discharge: 2015-09-29 | Disposition: A | Discharge status: Home / Self Care | Payer: Medicare Other | Source: Ambulatory Visit | Attending: Radiation Oncology | Admitting: Radiation Oncology

## 2015-09-29 DIAGNOSIS — Z809 Family history of malignant neoplasm, unspecified: Secondary | ICD-10-CM | POA: Diagnosis not present

## 2015-09-29 DIAGNOSIS — Z7982 Long term (current) use of aspirin: Secondary | ICD-10-CM | POA: Diagnosis not present

## 2015-09-29 DIAGNOSIS — I10 Essential (primary) hypertension: Secondary | ICD-10-CM | POA: Diagnosis not present

## 2015-09-29 DIAGNOSIS — Z79899 Other long term (current) drug therapy: Secondary | ICD-10-CM | POA: Diagnosis not present

## 2015-09-29 DIAGNOSIS — I2510 Atherosclerotic heart disease of native coronary artery without angina pectoris: Secondary | ICD-10-CM | POA: Diagnosis not present

## 2015-09-29 DIAGNOSIS — C7989 Secondary malignant neoplasm of other specified sites: Secondary | ICD-10-CM | POA: Diagnosis not present

## 2015-09-29 DIAGNOSIS — Z170 Estrogen receptor positive status [ER+]: Secondary | ICD-10-CM | POA: Diagnosis not present

## 2015-09-29 DIAGNOSIS — Z853 Personal history of malignant neoplasm of breast: Secondary | ICD-10-CM | POA: Diagnosis not present

## 2015-09-29 DIAGNOSIS — E7800 Pure hypercholesterolemia, unspecified: Secondary | ICD-10-CM | POA: Diagnosis not present

## 2015-09-29 DIAGNOSIS — Z9011 Acquired absence of right breast and nipple: Secondary | ICD-10-CM | POA: Diagnosis not present

## 2015-09-29 DIAGNOSIS — Z510 Encounter for antineoplastic radiation therapy: Secondary | ICD-10-CM | POA: Diagnosis not present

## 2015-09-29 DIAGNOSIS — Z87891 Personal history of nicotine dependence: Secondary | ICD-10-CM | POA: Diagnosis not present

## 2015-09-29 DIAGNOSIS — N289 Disorder of kidney and ureter, unspecified: Secondary | ICD-10-CM | POA: Diagnosis not present

## 2015-09-30 ENCOUNTER — Ambulatory Visit
Admission: RE | Admit: 2015-09-30 | Discharge: 2015-09-30 | Disposition: A | Discharge status: Home / Self Care | Payer: Medicare Other | Source: Ambulatory Visit | Attending: Radiation Oncology | Admitting: Radiation Oncology

## 2015-09-30 DIAGNOSIS — E7800 Pure hypercholesterolemia, unspecified: Secondary | ICD-10-CM | POA: Diagnosis not present

## 2015-09-30 DIAGNOSIS — Z170 Estrogen receptor positive status [ER+]: Secondary | ICD-10-CM | POA: Diagnosis not present

## 2015-09-30 DIAGNOSIS — I2510 Atherosclerotic heart disease of native coronary artery without angina pectoris: Secondary | ICD-10-CM | POA: Diagnosis not present

## 2015-09-30 DIAGNOSIS — Z79899 Other long term (current) drug therapy: Secondary | ICD-10-CM | POA: Diagnosis not present

## 2015-09-30 DIAGNOSIS — N289 Disorder of kidney and ureter, unspecified: Secondary | ICD-10-CM | POA: Diagnosis not present

## 2015-09-30 DIAGNOSIS — I10 Essential (primary) hypertension: Secondary | ICD-10-CM | POA: Diagnosis not present

## 2015-09-30 DIAGNOSIS — Z853 Personal history of malignant neoplasm of breast: Secondary | ICD-10-CM | POA: Diagnosis not present

## 2015-09-30 DIAGNOSIS — Z809 Family history of malignant neoplasm, unspecified: Secondary | ICD-10-CM | POA: Diagnosis not present

## 2015-09-30 DIAGNOSIS — Z510 Encounter for antineoplastic radiation therapy: Secondary | ICD-10-CM | POA: Diagnosis not present

## 2015-09-30 DIAGNOSIS — C7989 Secondary malignant neoplasm of other specified sites: Secondary | ICD-10-CM | POA: Diagnosis not present

## 2015-09-30 DIAGNOSIS — Z9011 Acquired absence of right breast and nipple: Secondary | ICD-10-CM | POA: Diagnosis not present

## 2015-09-30 DIAGNOSIS — Z87891 Personal history of nicotine dependence: Secondary | ICD-10-CM | POA: Diagnosis not present

## 2015-09-30 DIAGNOSIS — Z7982 Long term (current) use of aspirin: Secondary | ICD-10-CM | POA: Diagnosis not present

## 2015-10-01 ENCOUNTER — Ambulatory Visit
Admission: RE | Admit: 2015-10-01 | Discharge: 2015-10-01 | Disposition: A | Discharge status: Home / Self Care | Payer: Medicare Other | Source: Ambulatory Visit | Attending: Radiation Oncology | Admitting: Radiation Oncology

## 2015-10-01 DIAGNOSIS — C50211 Malignant neoplasm of upper-inner quadrant of right female breast: Secondary | ICD-10-CM | POA: Diagnosis not present

## 2015-10-01 DIAGNOSIS — Z7982 Long term (current) use of aspirin: Secondary | ICD-10-CM | POA: Diagnosis not present

## 2015-10-01 DIAGNOSIS — N289 Disorder of kidney and ureter, unspecified: Secondary | ICD-10-CM | POA: Diagnosis not present

## 2015-10-01 DIAGNOSIS — Z809 Family history of malignant neoplasm, unspecified: Secondary | ICD-10-CM | POA: Diagnosis not present

## 2015-10-01 DIAGNOSIS — C7989 Secondary malignant neoplasm of other specified sites: Secondary | ICD-10-CM | POA: Diagnosis not present

## 2015-10-01 DIAGNOSIS — Z510 Encounter for antineoplastic radiation therapy: Secondary | ICD-10-CM | POA: Diagnosis not present

## 2015-10-01 DIAGNOSIS — I10 Essential (primary) hypertension: Secondary | ICD-10-CM | POA: Diagnosis not present

## 2015-10-01 DIAGNOSIS — I2510 Atherosclerotic heart disease of native coronary artery without angina pectoris: Secondary | ICD-10-CM | POA: Diagnosis not present

## 2015-10-01 DIAGNOSIS — Z79899 Other long term (current) drug therapy: Secondary | ICD-10-CM | POA: Diagnosis not present

## 2015-10-01 DIAGNOSIS — Z87891 Personal history of nicotine dependence: Secondary | ICD-10-CM | POA: Diagnosis not present

## 2015-10-01 DIAGNOSIS — E7800 Pure hypercholesterolemia, unspecified: Secondary | ICD-10-CM | POA: Diagnosis not present

## 2015-10-01 DIAGNOSIS — Z170 Estrogen receptor positive status [ER+]: Secondary | ICD-10-CM | POA: Diagnosis not present

## 2015-10-01 DIAGNOSIS — Z853 Personal history of malignant neoplasm of breast: Secondary | ICD-10-CM | POA: Diagnosis not present

## 2015-10-01 DIAGNOSIS — Z9011 Acquired absence of right breast and nipple: Secondary | ICD-10-CM | POA: Diagnosis not present

## 2015-10-02 ENCOUNTER — Ambulatory Visit
Admission: RE | Admit: 2015-10-02 | Discharge: 2015-10-02 | Disposition: A | Discharge status: Home / Self Care | Payer: Medicare Other | Source: Ambulatory Visit | Attending: Radiation Oncology | Admitting: Radiation Oncology

## 2015-10-02 DIAGNOSIS — C7989 Secondary malignant neoplasm of other specified sites: Secondary | ICD-10-CM | POA: Diagnosis not present

## 2015-10-02 DIAGNOSIS — N289 Disorder of kidney and ureter, unspecified: Secondary | ICD-10-CM | POA: Diagnosis not present

## 2015-10-02 DIAGNOSIS — Z87891 Personal history of nicotine dependence: Secondary | ICD-10-CM | POA: Diagnosis not present

## 2015-10-02 DIAGNOSIS — Z79899 Other long term (current) drug therapy: Secondary | ICD-10-CM | POA: Diagnosis not present

## 2015-10-02 DIAGNOSIS — I10 Essential (primary) hypertension: Secondary | ICD-10-CM | POA: Diagnosis not present

## 2015-10-02 DIAGNOSIS — Z853 Personal history of malignant neoplasm of breast: Secondary | ICD-10-CM | POA: Diagnosis not present

## 2015-10-02 DIAGNOSIS — Z7982 Long term (current) use of aspirin: Secondary | ICD-10-CM | POA: Diagnosis not present

## 2015-10-02 DIAGNOSIS — Z9011 Acquired absence of right breast and nipple: Secondary | ICD-10-CM | POA: Diagnosis not present

## 2015-10-02 DIAGNOSIS — E7800 Pure hypercholesterolemia, unspecified: Secondary | ICD-10-CM | POA: Diagnosis not present

## 2015-10-02 DIAGNOSIS — I2510 Atherosclerotic heart disease of native coronary artery without angina pectoris: Secondary | ICD-10-CM | POA: Diagnosis not present

## 2015-10-02 DIAGNOSIS — Z170 Estrogen receptor positive status [ER+]: Secondary | ICD-10-CM | POA: Diagnosis not present

## 2015-10-02 DIAGNOSIS — Z809 Family history of malignant neoplasm, unspecified: Secondary | ICD-10-CM | POA: Diagnosis not present

## 2015-10-02 DIAGNOSIS — Z510 Encounter for antineoplastic radiation therapy: Secondary | ICD-10-CM | POA: Diagnosis not present

## 2015-10-03 ENCOUNTER — Ambulatory Visit
Admission: RE | Admit: 2015-10-03 | Discharge: 2015-10-03 | Disposition: A | Discharge status: Home / Self Care | Payer: Medicare Other | Source: Ambulatory Visit | Attending: Radiation Oncology | Admitting: Radiation Oncology

## 2015-10-03 DIAGNOSIS — E7800 Pure hypercholesterolemia, unspecified: Secondary | ICD-10-CM | POA: Diagnosis not present

## 2015-10-03 DIAGNOSIS — Z7982 Long term (current) use of aspirin: Secondary | ICD-10-CM | POA: Diagnosis not present

## 2015-10-03 DIAGNOSIS — C7989 Secondary malignant neoplasm of other specified sites: Secondary | ICD-10-CM | POA: Diagnosis not present

## 2015-10-03 DIAGNOSIS — I2510 Atherosclerotic heart disease of native coronary artery without angina pectoris: Secondary | ICD-10-CM | POA: Diagnosis not present

## 2015-10-03 DIAGNOSIS — Z170 Estrogen receptor positive status [ER+]: Secondary | ICD-10-CM | POA: Diagnosis not present

## 2015-10-03 DIAGNOSIS — Z510 Encounter for antineoplastic radiation therapy: Secondary | ICD-10-CM | POA: Diagnosis not present

## 2015-10-03 DIAGNOSIS — I10 Essential (primary) hypertension: Secondary | ICD-10-CM | POA: Diagnosis not present

## 2015-10-03 DIAGNOSIS — Z79899 Other long term (current) drug therapy: Secondary | ICD-10-CM | POA: Diagnosis not present

## 2015-10-03 DIAGNOSIS — Z87891 Personal history of nicotine dependence: Secondary | ICD-10-CM | POA: Diagnosis not present

## 2015-10-03 DIAGNOSIS — Z9011 Acquired absence of right breast and nipple: Secondary | ICD-10-CM | POA: Diagnosis not present

## 2015-10-03 DIAGNOSIS — Z809 Family history of malignant neoplasm, unspecified: Secondary | ICD-10-CM | POA: Diagnosis not present

## 2015-10-03 DIAGNOSIS — Z853 Personal history of malignant neoplasm of breast: Secondary | ICD-10-CM | POA: Diagnosis not present

## 2015-10-03 DIAGNOSIS — N289 Disorder of kidney and ureter, unspecified: Secondary | ICD-10-CM | POA: Diagnosis not present

## 2015-10-06 ENCOUNTER — Inpatient Hospital Stay: Payer: Medicare Other | Attending: Radiation Oncology

## 2015-10-06 ENCOUNTER — Ambulatory Visit
Admission: RE | Admit: 2015-10-06 | Discharge: 2015-10-06 | Disposition: A | Discharge status: Home / Self Care | Payer: Medicare Other | Source: Ambulatory Visit | Attending: Radiation Oncology | Admitting: Radiation Oncology

## 2015-10-06 DIAGNOSIS — C7989 Secondary malignant neoplasm of other specified sites: Secondary | ICD-10-CM | POA: Diagnosis not present

## 2015-10-06 DIAGNOSIS — Z853 Personal history of malignant neoplasm of breast: Secondary | ICD-10-CM | POA: Diagnosis not present

## 2015-10-06 DIAGNOSIS — N289 Disorder of kidney and ureter, unspecified: Secondary | ICD-10-CM | POA: Diagnosis not present

## 2015-10-06 DIAGNOSIS — Z87891 Personal history of nicotine dependence: Secondary | ICD-10-CM | POA: Diagnosis not present

## 2015-10-06 DIAGNOSIS — C50211 Malignant neoplasm of upper-inner quadrant of right female breast: Secondary | ICD-10-CM

## 2015-10-06 DIAGNOSIS — Z7982 Long term (current) use of aspirin: Secondary | ICD-10-CM | POA: Diagnosis not present

## 2015-10-06 DIAGNOSIS — Z79899 Other long term (current) drug therapy: Secondary | ICD-10-CM | POA: Diagnosis not present

## 2015-10-06 DIAGNOSIS — I2510 Atherosclerotic heart disease of native coronary artery without angina pectoris: Secondary | ICD-10-CM | POA: Diagnosis not present

## 2015-10-06 DIAGNOSIS — C50411 Malignant neoplasm of upper-outer quadrant of right female breast: Secondary | ICD-10-CM | POA: Insufficient documentation

## 2015-10-06 DIAGNOSIS — Z809 Family history of malignant neoplasm, unspecified: Secondary | ICD-10-CM | POA: Diagnosis not present

## 2015-10-06 DIAGNOSIS — E7800 Pure hypercholesterolemia, unspecified: Secondary | ICD-10-CM | POA: Diagnosis not present

## 2015-10-06 DIAGNOSIS — I10 Essential (primary) hypertension: Secondary | ICD-10-CM | POA: Diagnosis not present

## 2015-10-06 DIAGNOSIS — Z9011 Acquired absence of right breast and nipple: Secondary | ICD-10-CM | POA: Diagnosis not present

## 2015-10-06 DIAGNOSIS — Z170 Estrogen receptor positive status [ER+]: Secondary | ICD-10-CM | POA: Diagnosis not present

## 2015-10-06 DIAGNOSIS — Z510 Encounter for antineoplastic radiation therapy: Secondary | ICD-10-CM | POA: Diagnosis not present

## 2015-10-06 LAB — CBC
HCT: 41.8 % (ref 35.0–47.0)
Hemoglobin: 14.5 g/dL (ref 12.0–16.0)
MCH: 30.9 pg (ref 26.0–34.0)
MCHC: 34.7 g/dL (ref 32.0–36.0)
MCV: 88.9 fL (ref 80.0–100.0)
PLATELETS: 477 10*3/uL — AB (ref 150–440)
RBC: 4.7 MIL/uL (ref 3.80–5.20)
RDW: 13.3 % (ref 11.5–14.5)
WBC: 9 10*3/uL (ref 3.6–11.0)

## 2015-10-07 ENCOUNTER — Ambulatory Visit
Admission: RE | Admit: 2015-10-07 | Discharge: 2015-10-07 | Disposition: A | Discharge status: Home / Self Care | Payer: Medicare Other | Source: Ambulatory Visit | Attending: Radiation Oncology | Admitting: Radiation Oncology

## 2015-10-07 DIAGNOSIS — E7800 Pure hypercholesterolemia, unspecified: Secondary | ICD-10-CM | POA: Diagnosis not present

## 2015-10-07 DIAGNOSIS — N289 Disorder of kidney and ureter, unspecified: Secondary | ICD-10-CM | POA: Diagnosis not present

## 2015-10-07 DIAGNOSIS — Z87891 Personal history of nicotine dependence: Secondary | ICD-10-CM | POA: Diagnosis not present

## 2015-10-07 DIAGNOSIS — Z7982 Long term (current) use of aspirin: Secondary | ICD-10-CM | POA: Diagnosis not present

## 2015-10-07 DIAGNOSIS — C7989 Secondary malignant neoplasm of other specified sites: Secondary | ICD-10-CM | POA: Diagnosis not present

## 2015-10-07 DIAGNOSIS — Z170 Estrogen receptor positive status [ER+]: Secondary | ICD-10-CM | POA: Diagnosis not present

## 2015-10-07 DIAGNOSIS — Z510 Encounter for antineoplastic radiation therapy: Secondary | ICD-10-CM | POA: Diagnosis not present

## 2015-10-07 DIAGNOSIS — Z9011 Acquired absence of right breast and nipple: Secondary | ICD-10-CM | POA: Diagnosis not present

## 2015-10-07 DIAGNOSIS — I10 Essential (primary) hypertension: Secondary | ICD-10-CM | POA: Diagnosis not present

## 2015-10-07 DIAGNOSIS — Z809 Family history of malignant neoplasm, unspecified: Secondary | ICD-10-CM | POA: Diagnosis not present

## 2015-10-07 DIAGNOSIS — Z79899 Other long term (current) drug therapy: Secondary | ICD-10-CM | POA: Diagnosis not present

## 2015-10-07 DIAGNOSIS — I2510 Atherosclerotic heart disease of native coronary artery without angina pectoris: Secondary | ICD-10-CM | POA: Diagnosis not present

## 2015-10-07 DIAGNOSIS — Z853 Personal history of malignant neoplasm of breast: Secondary | ICD-10-CM | POA: Diagnosis not present

## 2015-10-08 ENCOUNTER — Ambulatory Visit
Admission: RE | Admit: 2015-10-08 | Discharge: 2015-10-08 | Disposition: A | Discharge status: Home / Self Care | Payer: Medicare Other | Source: Ambulatory Visit | Attending: Radiation Oncology | Admitting: Radiation Oncology

## 2015-10-08 DIAGNOSIS — C7989 Secondary malignant neoplasm of other specified sites: Secondary | ICD-10-CM | POA: Diagnosis not present

## 2015-10-08 DIAGNOSIS — C50211 Malignant neoplasm of upper-inner quadrant of right female breast: Secondary | ICD-10-CM | POA: Diagnosis not present

## 2015-10-08 DIAGNOSIS — E7800 Pure hypercholesterolemia, unspecified: Secondary | ICD-10-CM | POA: Diagnosis not present

## 2015-10-08 DIAGNOSIS — Z7982 Long term (current) use of aspirin: Secondary | ICD-10-CM | POA: Diagnosis not present

## 2015-10-08 DIAGNOSIS — I10 Essential (primary) hypertension: Secondary | ICD-10-CM | POA: Diagnosis not present

## 2015-10-08 DIAGNOSIS — Z87891 Personal history of nicotine dependence: Secondary | ICD-10-CM | POA: Diagnosis not present

## 2015-10-08 DIAGNOSIS — Z853 Personal history of malignant neoplasm of breast: Secondary | ICD-10-CM | POA: Diagnosis not present

## 2015-10-08 DIAGNOSIS — Z170 Estrogen receptor positive status [ER+]: Secondary | ICD-10-CM | POA: Diagnosis not present

## 2015-10-08 DIAGNOSIS — Z9011 Acquired absence of right breast and nipple: Secondary | ICD-10-CM | POA: Diagnosis not present

## 2015-10-08 DIAGNOSIS — Z79899 Other long term (current) drug therapy: Secondary | ICD-10-CM | POA: Diagnosis not present

## 2015-10-08 DIAGNOSIS — Z809 Family history of malignant neoplasm, unspecified: Secondary | ICD-10-CM | POA: Diagnosis not present

## 2015-10-08 DIAGNOSIS — I2510 Atherosclerotic heart disease of native coronary artery without angina pectoris: Secondary | ICD-10-CM | POA: Diagnosis not present

## 2015-10-08 DIAGNOSIS — N289 Disorder of kidney and ureter, unspecified: Secondary | ICD-10-CM | POA: Diagnosis not present

## 2015-10-08 DIAGNOSIS — Z510 Encounter for antineoplastic radiation therapy: Secondary | ICD-10-CM | POA: Diagnosis not present

## 2015-10-09 ENCOUNTER — Ambulatory Visit
Admission: RE | Admit: 2015-10-09 | Discharge: 2015-10-09 | Disposition: A | Discharge status: Home / Self Care | Payer: Medicare Other | Source: Ambulatory Visit | Attending: Radiation Oncology | Admitting: Radiation Oncology

## 2015-10-09 DIAGNOSIS — I10 Essential (primary) hypertension: Secondary | ICD-10-CM | POA: Diagnosis not present

## 2015-10-09 DIAGNOSIS — N289 Disorder of kidney and ureter, unspecified: Secondary | ICD-10-CM | POA: Diagnosis not present

## 2015-10-09 DIAGNOSIS — Z87891 Personal history of nicotine dependence: Secondary | ICD-10-CM | POA: Diagnosis not present

## 2015-10-09 DIAGNOSIS — Z7982 Long term (current) use of aspirin: Secondary | ICD-10-CM | POA: Diagnosis not present

## 2015-10-09 DIAGNOSIS — I2510 Atherosclerotic heart disease of native coronary artery without angina pectoris: Secondary | ICD-10-CM | POA: Diagnosis not present

## 2015-10-09 DIAGNOSIS — E7800 Pure hypercholesterolemia, unspecified: Secondary | ICD-10-CM | POA: Diagnosis not present

## 2015-10-09 DIAGNOSIS — Z510 Encounter for antineoplastic radiation therapy: Secondary | ICD-10-CM | POA: Diagnosis not present

## 2015-10-09 DIAGNOSIS — Z9011 Acquired absence of right breast and nipple: Secondary | ICD-10-CM | POA: Diagnosis not present

## 2015-10-09 DIAGNOSIS — Z809 Family history of malignant neoplasm, unspecified: Secondary | ICD-10-CM | POA: Diagnosis not present

## 2015-10-09 DIAGNOSIS — Z853 Personal history of malignant neoplasm of breast: Secondary | ICD-10-CM | POA: Diagnosis not present

## 2015-10-09 DIAGNOSIS — C7989 Secondary malignant neoplasm of other specified sites: Secondary | ICD-10-CM | POA: Diagnosis not present

## 2015-10-09 DIAGNOSIS — Z79899 Other long term (current) drug therapy: Secondary | ICD-10-CM | POA: Diagnosis not present

## 2015-10-09 DIAGNOSIS — Z170 Estrogen receptor positive status [ER+]: Secondary | ICD-10-CM | POA: Diagnosis not present

## 2015-10-10 ENCOUNTER — Ambulatory Visit
Admission: RE | Admit: 2015-10-10 | Discharge: 2015-10-10 | Disposition: A | Discharge status: Home / Self Care | Payer: Medicare Other | Source: Ambulatory Visit | Attending: Radiation Oncology | Admitting: Radiation Oncology

## 2015-10-10 DIAGNOSIS — Z79899 Other long term (current) drug therapy: Secondary | ICD-10-CM | POA: Diagnosis not present

## 2015-10-10 DIAGNOSIS — I2510 Atherosclerotic heart disease of native coronary artery without angina pectoris: Secondary | ICD-10-CM | POA: Diagnosis not present

## 2015-10-10 DIAGNOSIS — N289 Disorder of kidney and ureter, unspecified: Secondary | ICD-10-CM | POA: Diagnosis not present

## 2015-10-10 DIAGNOSIS — Z170 Estrogen receptor positive status [ER+]: Secondary | ICD-10-CM | POA: Diagnosis not present

## 2015-10-10 DIAGNOSIS — Z809 Family history of malignant neoplasm, unspecified: Secondary | ICD-10-CM | POA: Diagnosis not present

## 2015-10-10 DIAGNOSIS — Z853 Personal history of malignant neoplasm of breast: Secondary | ICD-10-CM | POA: Diagnosis not present

## 2015-10-10 DIAGNOSIS — E7800 Pure hypercholesterolemia, unspecified: Secondary | ICD-10-CM | POA: Diagnosis not present

## 2015-10-10 DIAGNOSIS — C7989 Secondary malignant neoplasm of other specified sites: Secondary | ICD-10-CM | POA: Diagnosis not present

## 2015-10-10 DIAGNOSIS — Z510 Encounter for antineoplastic radiation therapy: Secondary | ICD-10-CM | POA: Diagnosis not present

## 2015-10-10 DIAGNOSIS — Z7982 Long term (current) use of aspirin: Secondary | ICD-10-CM | POA: Diagnosis not present

## 2015-10-10 DIAGNOSIS — I10 Essential (primary) hypertension: Secondary | ICD-10-CM | POA: Diagnosis not present

## 2015-10-10 DIAGNOSIS — Z87891 Personal history of nicotine dependence: Secondary | ICD-10-CM | POA: Diagnosis not present

## 2015-10-10 DIAGNOSIS — Z9011 Acquired absence of right breast and nipple: Secondary | ICD-10-CM | POA: Diagnosis not present

## 2015-10-13 ENCOUNTER — Ambulatory Visit
Admission: RE | Admit: 2015-10-13 | Discharge: 2015-10-13 | Disposition: A | Discharge status: Home / Self Care | Payer: Medicare Other | Source: Ambulatory Visit | Attending: Radiation Oncology | Admitting: Radiation Oncology

## 2015-10-13 DIAGNOSIS — Z853 Personal history of malignant neoplasm of breast: Secondary | ICD-10-CM | POA: Diagnosis not present

## 2015-10-13 DIAGNOSIS — Z79899 Other long term (current) drug therapy: Secondary | ICD-10-CM | POA: Diagnosis not present

## 2015-10-13 DIAGNOSIS — Z9011 Acquired absence of right breast and nipple: Secondary | ICD-10-CM | POA: Diagnosis not present

## 2015-10-13 DIAGNOSIS — E7800 Pure hypercholesterolemia, unspecified: Secondary | ICD-10-CM | POA: Diagnosis not present

## 2015-10-13 DIAGNOSIS — I10 Essential (primary) hypertension: Secondary | ICD-10-CM | POA: Diagnosis not present

## 2015-10-13 DIAGNOSIS — Z809 Family history of malignant neoplasm, unspecified: Secondary | ICD-10-CM | POA: Diagnosis not present

## 2015-10-13 DIAGNOSIS — N289 Disorder of kidney and ureter, unspecified: Secondary | ICD-10-CM | POA: Diagnosis not present

## 2015-10-13 DIAGNOSIS — Z87891 Personal history of nicotine dependence: Secondary | ICD-10-CM | POA: Diagnosis not present

## 2015-10-13 DIAGNOSIS — Z170 Estrogen receptor positive status [ER+]: Secondary | ICD-10-CM | POA: Diagnosis not present

## 2015-10-13 DIAGNOSIS — Z510 Encounter for antineoplastic radiation therapy: Secondary | ICD-10-CM | POA: Diagnosis not present

## 2015-10-13 DIAGNOSIS — Z7982 Long term (current) use of aspirin: Secondary | ICD-10-CM | POA: Diagnosis not present

## 2015-10-13 DIAGNOSIS — I2510 Atherosclerotic heart disease of native coronary artery without angina pectoris: Secondary | ICD-10-CM | POA: Diagnosis not present

## 2015-10-13 DIAGNOSIS — C7989 Secondary malignant neoplasm of other specified sites: Secondary | ICD-10-CM | POA: Diagnosis not present

## 2015-10-14 ENCOUNTER — Ambulatory Visit
Admission: RE | Admit: 2015-10-14 | Discharge: 2015-10-14 | Disposition: A | Discharge status: Home / Self Care | Payer: Medicare Other | Source: Ambulatory Visit | Attending: Radiation Oncology | Admitting: Radiation Oncology

## 2015-10-14 DIAGNOSIS — I2510 Atherosclerotic heart disease of native coronary artery without angina pectoris: Secondary | ICD-10-CM | POA: Diagnosis not present

## 2015-10-14 DIAGNOSIS — Z7982 Long term (current) use of aspirin: Secondary | ICD-10-CM | POA: Diagnosis not present

## 2015-10-14 DIAGNOSIS — Z510 Encounter for antineoplastic radiation therapy: Secondary | ICD-10-CM | POA: Diagnosis not present

## 2015-10-14 DIAGNOSIS — Z853 Personal history of malignant neoplasm of breast: Secondary | ICD-10-CM | POA: Diagnosis not present

## 2015-10-14 DIAGNOSIS — I10 Essential (primary) hypertension: Secondary | ICD-10-CM | POA: Diagnosis not present

## 2015-10-14 DIAGNOSIS — Z809 Family history of malignant neoplasm, unspecified: Secondary | ICD-10-CM | POA: Diagnosis not present

## 2015-10-14 DIAGNOSIS — N289 Disorder of kidney and ureter, unspecified: Secondary | ICD-10-CM | POA: Diagnosis not present

## 2015-10-14 DIAGNOSIS — C7989 Secondary malignant neoplasm of other specified sites: Secondary | ICD-10-CM | POA: Diagnosis not present

## 2015-10-14 DIAGNOSIS — Z87891 Personal history of nicotine dependence: Secondary | ICD-10-CM | POA: Diagnosis not present

## 2015-10-14 DIAGNOSIS — E7800 Pure hypercholesterolemia, unspecified: Secondary | ICD-10-CM | POA: Diagnosis not present

## 2015-10-14 DIAGNOSIS — Z9011 Acquired absence of right breast and nipple: Secondary | ICD-10-CM | POA: Diagnosis not present

## 2015-10-14 DIAGNOSIS — Z79899 Other long term (current) drug therapy: Secondary | ICD-10-CM | POA: Diagnosis not present

## 2015-10-14 DIAGNOSIS — Z170 Estrogen receptor positive status [ER+]: Secondary | ICD-10-CM | POA: Diagnosis not present

## 2015-10-15 ENCOUNTER — Ambulatory Visit
Admission: RE | Admit: 2015-10-15 | Discharge: 2015-10-15 | Disposition: A | Discharge status: Home / Self Care | Payer: Medicare Other | Source: Ambulatory Visit | Attending: Radiation Oncology | Admitting: Radiation Oncology

## 2015-10-15 DIAGNOSIS — Z510 Encounter for antineoplastic radiation therapy: Secondary | ICD-10-CM | POA: Diagnosis not present

## 2015-10-15 DIAGNOSIS — N289 Disorder of kidney and ureter, unspecified: Secondary | ICD-10-CM | POA: Diagnosis not present

## 2015-10-15 DIAGNOSIS — I10 Essential (primary) hypertension: Secondary | ICD-10-CM | POA: Diagnosis not present

## 2015-10-15 DIAGNOSIS — Z9011 Acquired absence of right breast and nipple: Secondary | ICD-10-CM | POA: Diagnosis not present

## 2015-10-15 DIAGNOSIS — Z809 Family history of malignant neoplasm, unspecified: Secondary | ICD-10-CM | POA: Diagnosis not present

## 2015-10-15 DIAGNOSIS — Z7982 Long term (current) use of aspirin: Secondary | ICD-10-CM | POA: Diagnosis not present

## 2015-10-15 DIAGNOSIS — Z853 Personal history of malignant neoplasm of breast: Secondary | ICD-10-CM | POA: Diagnosis not present

## 2015-10-15 DIAGNOSIS — Z79899 Other long term (current) drug therapy: Secondary | ICD-10-CM | POA: Diagnosis not present

## 2015-10-15 DIAGNOSIS — C7989 Secondary malignant neoplasm of other specified sites: Secondary | ICD-10-CM | POA: Diagnosis not present

## 2015-10-15 DIAGNOSIS — I2510 Atherosclerotic heart disease of native coronary artery without angina pectoris: Secondary | ICD-10-CM | POA: Diagnosis not present

## 2015-10-15 DIAGNOSIS — E7800 Pure hypercholesterolemia, unspecified: Secondary | ICD-10-CM | POA: Diagnosis not present

## 2015-10-15 DIAGNOSIS — Z87891 Personal history of nicotine dependence: Secondary | ICD-10-CM | POA: Diagnosis not present

## 2015-10-15 DIAGNOSIS — Z170 Estrogen receptor positive status [ER+]: Secondary | ICD-10-CM | POA: Diagnosis not present

## 2015-10-15 DIAGNOSIS — C50211 Malignant neoplasm of upper-inner quadrant of right female breast: Secondary | ICD-10-CM | POA: Diagnosis not present

## 2015-10-16 ENCOUNTER — Ambulatory Visit
Admission: RE | Admit: 2015-10-16 | Discharge: 2015-10-16 | Disposition: A | Discharge status: Home / Self Care | Payer: Medicare Other | Source: Ambulatory Visit | Attending: Radiation Oncology | Admitting: Radiation Oncology

## 2015-10-16 DIAGNOSIS — Z87891 Personal history of nicotine dependence: Secondary | ICD-10-CM | POA: Diagnosis not present

## 2015-10-16 DIAGNOSIS — Z79899 Other long term (current) drug therapy: Secondary | ICD-10-CM | POA: Diagnosis not present

## 2015-10-16 DIAGNOSIS — E7800 Pure hypercholesterolemia, unspecified: Secondary | ICD-10-CM | POA: Diagnosis not present

## 2015-10-16 DIAGNOSIS — Z170 Estrogen receptor positive status [ER+]: Secondary | ICD-10-CM | POA: Diagnosis not present

## 2015-10-16 DIAGNOSIS — C7989 Secondary malignant neoplasm of other specified sites: Secondary | ICD-10-CM | POA: Diagnosis not present

## 2015-10-16 DIAGNOSIS — Z510 Encounter for antineoplastic radiation therapy: Secondary | ICD-10-CM | POA: Diagnosis not present

## 2015-10-16 DIAGNOSIS — Z809 Family history of malignant neoplasm, unspecified: Secondary | ICD-10-CM | POA: Diagnosis not present

## 2015-10-16 DIAGNOSIS — N289 Disorder of kidney and ureter, unspecified: Secondary | ICD-10-CM | POA: Diagnosis not present

## 2015-10-16 DIAGNOSIS — Z853 Personal history of malignant neoplasm of breast: Secondary | ICD-10-CM | POA: Diagnosis not present

## 2015-10-16 DIAGNOSIS — I10 Essential (primary) hypertension: Secondary | ICD-10-CM | POA: Diagnosis not present

## 2015-10-16 DIAGNOSIS — I2510 Atherosclerotic heart disease of native coronary artery without angina pectoris: Secondary | ICD-10-CM | POA: Diagnosis not present

## 2015-10-16 DIAGNOSIS — Z9011 Acquired absence of right breast and nipple: Secondary | ICD-10-CM | POA: Diagnosis not present

## 2015-10-16 DIAGNOSIS — Z7982 Long term (current) use of aspirin: Secondary | ICD-10-CM | POA: Diagnosis not present

## 2015-10-17 ENCOUNTER — Ambulatory Visit
Admission: RE | Admit: 2015-10-17 | Discharge: 2015-10-17 | Disposition: A | Discharge status: Home / Self Care | Payer: Medicare Other | Source: Ambulatory Visit | Attending: Radiation Oncology | Admitting: Radiation Oncology

## 2015-10-17 DIAGNOSIS — C7989 Secondary malignant neoplasm of other specified sites: Secondary | ICD-10-CM | POA: Diagnosis not present

## 2015-10-17 DIAGNOSIS — Z87891 Personal history of nicotine dependence: Secondary | ICD-10-CM | POA: Diagnosis not present

## 2015-10-17 DIAGNOSIS — Z7982 Long term (current) use of aspirin: Secondary | ICD-10-CM | POA: Diagnosis not present

## 2015-10-17 DIAGNOSIS — Z510 Encounter for antineoplastic radiation therapy: Secondary | ICD-10-CM | POA: Diagnosis not present

## 2015-10-17 DIAGNOSIS — E7800 Pure hypercholesterolemia, unspecified: Secondary | ICD-10-CM | POA: Diagnosis not present

## 2015-10-17 DIAGNOSIS — I2510 Atherosclerotic heart disease of native coronary artery without angina pectoris: Secondary | ICD-10-CM | POA: Diagnosis not present

## 2015-10-17 DIAGNOSIS — I10 Essential (primary) hypertension: Secondary | ICD-10-CM | POA: Diagnosis not present

## 2015-10-17 DIAGNOSIS — Z809 Family history of malignant neoplasm, unspecified: Secondary | ICD-10-CM | POA: Diagnosis not present

## 2015-10-17 DIAGNOSIS — Z9011 Acquired absence of right breast and nipple: Secondary | ICD-10-CM | POA: Diagnosis not present

## 2015-10-17 DIAGNOSIS — Z170 Estrogen receptor positive status [ER+]: Secondary | ICD-10-CM | POA: Diagnosis not present

## 2015-10-17 DIAGNOSIS — Z79899 Other long term (current) drug therapy: Secondary | ICD-10-CM | POA: Diagnosis not present

## 2015-10-17 DIAGNOSIS — N289 Disorder of kidney and ureter, unspecified: Secondary | ICD-10-CM | POA: Diagnosis not present

## 2015-10-17 DIAGNOSIS — Z853 Personal history of malignant neoplasm of breast: Secondary | ICD-10-CM | POA: Diagnosis not present

## 2015-10-20 ENCOUNTER — Inpatient Hospital Stay: Payer: Medicare Other

## 2015-10-20 ENCOUNTER — Ambulatory Visit
Admission: RE | Admit: 2015-10-20 | Discharge: 2015-10-20 | Disposition: A | Discharge status: Home / Self Care | Payer: Medicare Other | Source: Ambulatory Visit | Attending: Radiation Oncology | Admitting: Radiation Oncology

## 2015-10-20 DIAGNOSIS — Z170 Estrogen receptor positive status [ER+]: Secondary | ICD-10-CM | POA: Diagnosis not present

## 2015-10-20 DIAGNOSIS — I10 Essential (primary) hypertension: Secondary | ICD-10-CM | POA: Diagnosis not present

## 2015-10-20 DIAGNOSIS — I2510 Atherosclerotic heart disease of native coronary artery without angina pectoris: Secondary | ICD-10-CM | POA: Diagnosis not present

## 2015-10-20 DIAGNOSIS — Z87891 Personal history of nicotine dependence: Secondary | ICD-10-CM | POA: Diagnosis not present

## 2015-10-20 DIAGNOSIS — Z7982 Long term (current) use of aspirin: Secondary | ICD-10-CM | POA: Diagnosis not present

## 2015-10-20 DIAGNOSIS — Z510 Encounter for antineoplastic radiation therapy: Secondary | ICD-10-CM | POA: Diagnosis not present

## 2015-10-20 DIAGNOSIS — Z9011 Acquired absence of right breast and nipple: Secondary | ICD-10-CM | POA: Diagnosis not present

## 2015-10-20 DIAGNOSIS — Z79899 Other long term (current) drug therapy: Secondary | ICD-10-CM | POA: Diagnosis not present

## 2015-10-20 DIAGNOSIS — C7989 Secondary malignant neoplasm of other specified sites: Secondary | ICD-10-CM | POA: Diagnosis not present

## 2015-10-20 DIAGNOSIS — Z853 Personal history of malignant neoplasm of breast: Secondary | ICD-10-CM | POA: Diagnosis not present

## 2015-10-20 DIAGNOSIS — E7800 Pure hypercholesterolemia, unspecified: Secondary | ICD-10-CM | POA: Diagnosis not present

## 2015-10-20 DIAGNOSIS — Z809 Family history of malignant neoplasm, unspecified: Secondary | ICD-10-CM | POA: Diagnosis not present

## 2015-10-20 DIAGNOSIS — N289 Disorder of kidney and ureter, unspecified: Secondary | ICD-10-CM | POA: Diagnosis not present

## 2015-10-20 DIAGNOSIS — C50211 Malignant neoplasm of upper-inner quadrant of right female breast: Secondary | ICD-10-CM | POA: Diagnosis not present

## 2015-10-21 ENCOUNTER — Ambulatory Visit
Admission: RE | Admit: 2015-10-21 | Discharge: 2015-10-21 | Disposition: A | Discharge status: Home / Self Care | Payer: Medicare Other | Source: Ambulatory Visit | Attending: Radiation Oncology | Admitting: Radiation Oncology

## 2015-10-21 DIAGNOSIS — I10 Essential (primary) hypertension: Secondary | ICD-10-CM | POA: Diagnosis not present

## 2015-10-21 DIAGNOSIS — Z9011 Acquired absence of right breast and nipple: Secondary | ICD-10-CM | POA: Diagnosis not present

## 2015-10-21 DIAGNOSIS — Z809 Family history of malignant neoplasm, unspecified: Secondary | ICD-10-CM | POA: Diagnosis not present

## 2015-10-21 DIAGNOSIS — E7800 Pure hypercholesterolemia, unspecified: Secondary | ICD-10-CM | POA: Diagnosis not present

## 2015-10-21 DIAGNOSIS — Z510 Encounter for antineoplastic radiation therapy: Secondary | ICD-10-CM | POA: Diagnosis not present

## 2015-10-21 DIAGNOSIS — C7989 Secondary malignant neoplasm of other specified sites: Secondary | ICD-10-CM | POA: Diagnosis not present

## 2015-10-21 DIAGNOSIS — Z87891 Personal history of nicotine dependence: Secondary | ICD-10-CM | POA: Diagnosis not present

## 2015-10-21 DIAGNOSIS — N289 Disorder of kidney and ureter, unspecified: Secondary | ICD-10-CM | POA: Diagnosis not present

## 2015-10-21 DIAGNOSIS — Z79899 Other long term (current) drug therapy: Secondary | ICD-10-CM | POA: Diagnosis not present

## 2015-10-21 DIAGNOSIS — I2510 Atherosclerotic heart disease of native coronary artery without angina pectoris: Secondary | ICD-10-CM | POA: Diagnosis not present

## 2015-10-21 DIAGNOSIS — Z7982 Long term (current) use of aspirin: Secondary | ICD-10-CM | POA: Diagnosis not present

## 2015-10-21 DIAGNOSIS — Z170 Estrogen receptor positive status [ER+]: Secondary | ICD-10-CM | POA: Diagnosis not present

## 2015-10-21 DIAGNOSIS — Z853 Personal history of malignant neoplasm of breast: Secondary | ICD-10-CM | POA: Diagnosis not present

## 2015-10-22 ENCOUNTER — Ambulatory Visit
Admission: RE | Admit: 2015-10-22 | Discharge: 2015-10-22 | Disposition: A | Discharge status: Home / Self Care | Payer: Medicare Other | Source: Ambulatory Visit | Attending: Radiation Oncology | Admitting: Radiation Oncology

## 2015-10-22 DIAGNOSIS — C7989 Secondary malignant neoplasm of other specified sites: Secondary | ICD-10-CM | POA: Diagnosis not present

## 2015-10-22 DIAGNOSIS — I10 Essential (primary) hypertension: Secondary | ICD-10-CM | POA: Diagnosis not present

## 2015-10-22 DIAGNOSIS — I2510 Atherosclerotic heart disease of native coronary artery without angina pectoris: Secondary | ICD-10-CM | POA: Diagnosis not present

## 2015-10-22 DIAGNOSIS — Z87891 Personal history of nicotine dependence: Secondary | ICD-10-CM | POA: Diagnosis not present

## 2015-10-22 DIAGNOSIS — Z510 Encounter for antineoplastic radiation therapy: Secondary | ICD-10-CM | POA: Diagnosis not present

## 2015-10-22 DIAGNOSIS — Z853 Personal history of malignant neoplasm of breast: Secondary | ICD-10-CM | POA: Diagnosis not present

## 2015-10-22 DIAGNOSIS — N289 Disorder of kidney and ureter, unspecified: Secondary | ICD-10-CM | POA: Diagnosis not present

## 2015-10-22 DIAGNOSIS — E7800 Pure hypercholesterolemia, unspecified: Secondary | ICD-10-CM | POA: Diagnosis not present

## 2015-10-22 DIAGNOSIS — Z9011 Acquired absence of right breast and nipple: Secondary | ICD-10-CM | POA: Diagnosis not present

## 2015-10-22 DIAGNOSIS — Z809 Family history of malignant neoplasm, unspecified: Secondary | ICD-10-CM | POA: Diagnosis not present

## 2015-10-22 DIAGNOSIS — Z7982 Long term (current) use of aspirin: Secondary | ICD-10-CM | POA: Diagnosis not present

## 2015-10-22 DIAGNOSIS — Z170 Estrogen receptor positive status [ER+]: Secondary | ICD-10-CM | POA: Diagnosis not present

## 2015-10-22 DIAGNOSIS — Z79899 Other long term (current) drug therapy: Secondary | ICD-10-CM | POA: Diagnosis not present

## 2015-10-27 ENCOUNTER — Ambulatory Visit
Admission: RE | Admit: 2015-10-27 | Discharge: 2015-10-27 | Disposition: A | Discharge status: Home / Self Care | Payer: Medicare Other | Source: Ambulatory Visit | Attending: Radiation Oncology | Admitting: Radiation Oncology

## 2015-10-27 DIAGNOSIS — Z87891 Personal history of nicotine dependence: Secondary | ICD-10-CM | POA: Diagnosis not present

## 2015-10-27 DIAGNOSIS — N289 Disorder of kidney and ureter, unspecified: Secondary | ICD-10-CM | POA: Diagnosis not present

## 2015-10-27 DIAGNOSIS — I2510 Atherosclerotic heart disease of native coronary artery without angina pectoris: Secondary | ICD-10-CM | POA: Diagnosis not present

## 2015-10-27 DIAGNOSIS — Z510 Encounter for antineoplastic radiation therapy: Secondary | ICD-10-CM | POA: Diagnosis not present

## 2015-10-27 DIAGNOSIS — Z7982 Long term (current) use of aspirin: Secondary | ICD-10-CM | POA: Diagnosis not present

## 2015-10-27 DIAGNOSIS — Z9011 Acquired absence of right breast and nipple: Secondary | ICD-10-CM | POA: Diagnosis not present

## 2015-10-27 DIAGNOSIS — C7989 Secondary malignant neoplasm of other specified sites: Secondary | ICD-10-CM | POA: Diagnosis not present

## 2015-10-27 DIAGNOSIS — Z170 Estrogen receptor positive status [ER+]: Secondary | ICD-10-CM | POA: Diagnosis not present

## 2015-10-27 DIAGNOSIS — I10 Essential (primary) hypertension: Secondary | ICD-10-CM | POA: Diagnosis not present

## 2015-10-27 DIAGNOSIS — Z853 Personal history of malignant neoplasm of breast: Secondary | ICD-10-CM | POA: Diagnosis not present

## 2015-10-27 DIAGNOSIS — Z79899 Other long term (current) drug therapy: Secondary | ICD-10-CM | POA: Diagnosis not present

## 2015-10-27 DIAGNOSIS — Z809 Family history of malignant neoplasm, unspecified: Secondary | ICD-10-CM | POA: Diagnosis not present

## 2015-10-27 DIAGNOSIS — E7800 Pure hypercholesterolemia, unspecified: Secondary | ICD-10-CM | POA: Diagnosis not present

## 2015-10-28 ENCOUNTER — Ambulatory Visit
Admission: RE | Admit: 2015-10-28 | Discharge: 2015-10-28 | Disposition: A | Discharge status: Home / Self Care | Payer: Medicare Other | Source: Ambulatory Visit | Attending: Radiation Oncology | Admitting: Radiation Oncology

## 2015-10-28 DIAGNOSIS — I10 Essential (primary) hypertension: Secondary | ICD-10-CM | POA: Diagnosis not present

## 2015-10-28 DIAGNOSIS — Z170 Estrogen receptor positive status [ER+]: Secondary | ICD-10-CM | POA: Diagnosis not present

## 2015-10-28 DIAGNOSIS — Z9011 Acquired absence of right breast and nipple: Secondary | ICD-10-CM | POA: Diagnosis not present

## 2015-10-28 DIAGNOSIS — Z809 Family history of malignant neoplasm, unspecified: Secondary | ICD-10-CM | POA: Diagnosis not present

## 2015-10-28 DIAGNOSIS — I2510 Atherosclerotic heart disease of native coronary artery without angina pectoris: Secondary | ICD-10-CM | POA: Diagnosis not present

## 2015-10-28 DIAGNOSIS — Z87891 Personal history of nicotine dependence: Secondary | ICD-10-CM | POA: Diagnosis not present

## 2015-10-28 DIAGNOSIS — Z510 Encounter for antineoplastic radiation therapy: Secondary | ICD-10-CM | POA: Diagnosis not present

## 2015-10-28 DIAGNOSIS — Z853 Personal history of malignant neoplasm of breast: Secondary | ICD-10-CM | POA: Diagnosis not present

## 2015-10-28 DIAGNOSIS — E7800 Pure hypercholesterolemia, unspecified: Secondary | ICD-10-CM | POA: Diagnosis not present

## 2015-10-28 DIAGNOSIS — C50211 Malignant neoplasm of upper-inner quadrant of right female breast: Secondary | ICD-10-CM | POA: Diagnosis not present

## 2015-10-28 DIAGNOSIS — Z79899 Other long term (current) drug therapy: Secondary | ICD-10-CM | POA: Diagnosis not present

## 2015-10-28 DIAGNOSIS — Z7982 Long term (current) use of aspirin: Secondary | ICD-10-CM | POA: Diagnosis not present

## 2015-10-28 DIAGNOSIS — N289 Disorder of kidney and ureter, unspecified: Secondary | ICD-10-CM | POA: Diagnosis not present

## 2015-10-28 DIAGNOSIS — C7989 Secondary malignant neoplasm of other specified sites: Secondary | ICD-10-CM | POA: Diagnosis not present

## 2015-10-29 ENCOUNTER — Ambulatory Visit
Admission: RE | Admit: 2015-10-29 | Discharge: 2015-10-29 | Disposition: A | Discharge status: Home / Self Care | Payer: Medicare Other | Source: Ambulatory Visit | Attending: Radiation Oncology | Admitting: Radiation Oncology

## 2015-10-29 DIAGNOSIS — E7800 Pure hypercholesterolemia, unspecified: Secondary | ICD-10-CM | POA: Diagnosis not present

## 2015-10-29 DIAGNOSIS — I10 Essential (primary) hypertension: Secondary | ICD-10-CM | POA: Diagnosis not present

## 2015-10-29 DIAGNOSIS — Z853 Personal history of malignant neoplasm of breast: Secondary | ICD-10-CM | POA: Diagnosis not present

## 2015-10-29 DIAGNOSIS — Z87891 Personal history of nicotine dependence: Secondary | ICD-10-CM | POA: Diagnosis not present

## 2015-10-29 DIAGNOSIS — N289 Disorder of kidney and ureter, unspecified: Secondary | ICD-10-CM | POA: Diagnosis not present

## 2015-10-29 DIAGNOSIS — Z170 Estrogen receptor positive status [ER+]: Secondary | ICD-10-CM | POA: Diagnosis not present

## 2015-10-29 DIAGNOSIS — Z7982 Long term (current) use of aspirin: Secondary | ICD-10-CM | POA: Diagnosis not present

## 2015-10-29 DIAGNOSIS — I2510 Atherosclerotic heart disease of native coronary artery without angina pectoris: Secondary | ICD-10-CM | POA: Diagnosis not present

## 2015-10-29 DIAGNOSIS — Z79899 Other long term (current) drug therapy: Secondary | ICD-10-CM | POA: Diagnosis not present

## 2015-10-29 DIAGNOSIS — Z809 Family history of malignant neoplasm, unspecified: Secondary | ICD-10-CM | POA: Diagnosis not present

## 2015-10-29 DIAGNOSIS — Z510 Encounter for antineoplastic radiation therapy: Secondary | ICD-10-CM | POA: Diagnosis not present

## 2015-10-29 DIAGNOSIS — Z9011 Acquired absence of right breast and nipple: Secondary | ICD-10-CM | POA: Diagnosis not present

## 2015-10-29 DIAGNOSIS — C7989 Secondary malignant neoplasm of other specified sites: Secondary | ICD-10-CM | POA: Diagnosis not present

## 2015-10-30 ENCOUNTER — Ambulatory Visit
Admission: RE | Admit: 2015-10-30 | Discharge: 2015-10-30 | Disposition: A | Discharge status: Home / Self Care | Payer: Medicare Other | Source: Ambulatory Visit | Attending: Radiation Oncology | Admitting: Radiation Oncology

## 2015-10-30 DIAGNOSIS — C50211 Malignant neoplasm of upper-inner quadrant of right female breast: Secondary | ICD-10-CM | POA: Diagnosis not present

## 2015-10-30 DIAGNOSIS — N289 Disorder of kidney and ureter, unspecified: Secondary | ICD-10-CM | POA: Diagnosis not present

## 2015-10-30 DIAGNOSIS — Z79899 Other long term (current) drug therapy: Secondary | ICD-10-CM | POA: Diagnosis not present

## 2015-10-30 DIAGNOSIS — Z7982 Long term (current) use of aspirin: Secondary | ICD-10-CM | POA: Diagnosis not present

## 2015-10-30 DIAGNOSIS — Z170 Estrogen receptor positive status [ER+]: Secondary | ICD-10-CM | POA: Diagnosis not present

## 2015-10-30 DIAGNOSIS — I2510 Atherosclerotic heart disease of native coronary artery without angina pectoris: Secondary | ICD-10-CM | POA: Diagnosis not present

## 2015-10-30 DIAGNOSIS — Z87891 Personal history of nicotine dependence: Secondary | ICD-10-CM | POA: Diagnosis not present

## 2015-10-30 DIAGNOSIS — Z9011 Acquired absence of right breast and nipple: Secondary | ICD-10-CM | POA: Diagnosis not present

## 2015-10-30 DIAGNOSIS — I10 Essential (primary) hypertension: Secondary | ICD-10-CM | POA: Diagnosis not present

## 2015-10-30 DIAGNOSIS — Z809 Family history of malignant neoplasm, unspecified: Secondary | ICD-10-CM | POA: Diagnosis not present

## 2015-10-30 DIAGNOSIS — Z853 Personal history of malignant neoplasm of breast: Secondary | ICD-10-CM | POA: Diagnosis not present

## 2015-10-30 DIAGNOSIS — Z510 Encounter for antineoplastic radiation therapy: Secondary | ICD-10-CM | POA: Diagnosis not present

## 2015-10-30 DIAGNOSIS — C7989 Secondary malignant neoplasm of other specified sites: Secondary | ICD-10-CM | POA: Diagnosis not present

## 2015-10-30 DIAGNOSIS — E7800 Pure hypercholesterolemia, unspecified: Secondary | ICD-10-CM | POA: Diagnosis not present

## 2015-10-31 ENCOUNTER — Ambulatory Visit
Admission: RE | Admit: 2015-10-31 | Discharge: 2015-10-31 | Disposition: A | Discharge status: Home / Self Care | Payer: Medicare Other | Source: Ambulatory Visit | Attending: Radiation Oncology | Admitting: Radiation Oncology

## 2015-10-31 DIAGNOSIS — Z170 Estrogen receptor positive status [ER+]: Secondary | ICD-10-CM | POA: Diagnosis not present

## 2015-10-31 DIAGNOSIS — I10 Essential (primary) hypertension: Secondary | ICD-10-CM | POA: Diagnosis not present

## 2015-10-31 DIAGNOSIS — I2510 Atherosclerotic heart disease of native coronary artery without angina pectoris: Secondary | ICD-10-CM | POA: Diagnosis not present

## 2015-10-31 DIAGNOSIS — Z7982 Long term (current) use of aspirin: Secondary | ICD-10-CM | POA: Diagnosis not present

## 2015-10-31 DIAGNOSIS — Z79899 Other long term (current) drug therapy: Secondary | ICD-10-CM | POA: Diagnosis not present

## 2015-10-31 DIAGNOSIS — E7800 Pure hypercholesterolemia, unspecified: Secondary | ICD-10-CM | POA: Diagnosis not present

## 2015-10-31 DIAGNOSIS — N289 Disorder of kidney and ureter, unspecified: Secondary | ICD-10-CM | POA: Diagnosis not present

## 2015-10-31 DIAGNOSIS — Z853 Personal history of malignant neoplasm of breast: Secondary | ICD-10-CM | POA: Diagnosis not present

## 2015-10-31 DIAGNOSIS — Z87891 Personal history of nicotine dependence: Secondary | ICD-10-CM | POA: Diagnosis not present

## 2015-10-31 DIAGNOSIS — Z510 Encounter for antineoplastic radiation therapy: Secondary | ICD-10-CM | POA: Diagnosis not present

## 2015-10-31 DIAGNOSIS — Z809 Family history of malignant neoplasm, unspecified: Secondary | ICD-10-CM | POA: Diagnosis not present

## 2015-10-31 DIAGNOSIS — Z9011 Acquired absence of right breast and nipple: Secondary | ICD-10-CM | POA: Diagnosis not present

## 2015-10-31 DIAGNOSIS — C7989 Secondary malignant neoplasm of other specified sites: Secondary | ICD-10-CM | POA: Diagnosis not present

## 2015-11-03 ENCOUNTER — Inpatient Hospital Stay: Payer: Medicare Other | Attending: Radiation Oncology

## 2015-11-03 ENCOUNTER — Ambulatory Visit
Admission: RE | Admit: 2015-11-03 | Discharge: 2015-11-03 | Disposition: A | Discharge status: Home / Self Care | Payer: Medicare Other | Source: Ambulatory Visit | Attending: Radiation Oncology | Admitting: Radiation Oncology

## 2015-11-03 DIAGNOSIS — Z79899 Other long term (current) drug therapy: Secondary | ICD-10-CM | POA: Diagnosis not present

## 2015-11-03 DIAGNOSIS — Z87891 Personal history of nicotine dependence: Secondary | ICD-10-CM | POA: Diagnosis not present

## 2015-11-03 DIAGNOSIS — N289 Disorder of kidney and ureter, unspecified: Secondary | ICD-10-CM | POA: Diagnosis not present

## 2015-11-03 DIAGNOSIS — E7800 Pure hypercholesterolemia, unspecified: Secondary | ICD-10-CM | POA: Diagnosis not present

## 2015-11-03 DIAGNOSIS — Z510 Encounter for antineoplastic radiation therapy: Secondary | ICD-10-CM | POA: Diagnosis not present

## 2015-11-03 DIAGNOSIS — C7989 Secondary malignant neoplasm of other specified sites: Secondary | ICD-10-CM | POA: Diagnosis not present

## 2015-11-03 DIAGNOSIS — Z7982 Long term (current) use of aspirin: Secondary | ICD-10-CM | POA: Diagnosis not present

## 2015-11-03 DIAGNOSIS — I2510 Atherosclerotic heart disease of native coronary artery without angina pectoris: Secondary | ICD-10-CM | POA: Diagnosis not present

## 2015-11-03 DIAGNOSIS — Z853 Personal history of malignant neoplasm of breast: Secondary | ICD-10-CM | POA: Diagnosis not present

## 2015-11-03 DIAGNOSIS — Z809 Family history of malignant neoplasm, unspecified: Secondary | ICD-10-CM | POA: Diagnosis not present

## 2015-11-03 DIAGNOSIS — Z170 Estrogen receptor positive status [ER+]: Secondary | ICD-10-CM | POA: Insufficient documentation

## 2015-11-03 DIAGNOSIS — C50411 Malignant neoplasm of upper-outer quadrant of right female breast: Secondary | ICD-10-CM | POA: Insufficient documentation

## 2015-11-03 DIAGNOSIS — I10 Essential (primary) hypertension: Secondary | ICD-10-CM | POA: Diagnosis not present

## 2015-11-03 DIAGNOSIS — Z9011 Acquired absence of right breast and nipple: Secondary | ICD-10-CM | POA: Diagnosis not present

## 2015-11-04 ENCOUNTER — Ambulatory Visit
Admission: RE | Admit: 2015-11-04 | Discharge: 2015-11-04 | Disposition: A | Discharge status: Home / Self Care | Payer: Medicare Other | Source: Ambulatory Visit | Attending: Radiation Oncology | Admitting: Radiation Oncology

## 2015-11-04 DIAGNOSIS — Z510 Encounter for antineoplastic radiation therapy: Secondary | ICD-10-CM | POA: Diagnosis not present

## 2015-11-04 DIAGNOSIS — I2510 Atherosclerotic heart disease of native coronary artery without angina pectoris: Secondary | ICD-10-CM | POA: Diagnosis not present

## 2015-11-04 DIAGNOSIS — C50211 Malignant neoplasm of upper-inner quadrant of right female breast: Secondary | ICD-10-CM | POA: Diagnosis not present

## 2015-11-04 DIAGNOSIS — I10 Essential (primary) hypertension: Secondary | ICD-10-CM | POA: Diagnosis not present

## 2015-11-04 DIAGNOSIS — Z79899 Other long term (current) drug therapy: Secondary | ICD-10-CM | POA: Diagnosis not present

## 2015-11-04 DIAGNOSIS — Z7982 Long term (current) use of aspirin: Secondary | ICD-10-CM | POA: Diagnosis not present

## 2015-11-04 DIAGNOSIS — Z170 Estrogen receptor positive status [ER+]: Secondary | ICD-10-CM | POA: Diagnosis not present

## 2015-11-04 DIAGNOSIS — Z853 Personal history of malignant neoplasm of breast: Secondary | ICD-10-CM | POA: Diagnosis not present

## 2015-11-04 DIAGNOSIS — C7989 Secondary malignant neoplasm of other specified sites: Secondary | ICD-10-CM | POA: Diagnosis not present

## 2015-11-04 DIAGNOSIS — Z9011 Acquired absence of right breast and nipple: Secondary | ICD-10-CM | POA: Diagnosis not present

## 2015-11-04 DIAGNOSIS — Z87891 Personal history of nicotine dependence: Secondary | ICD-10-CM | POA: Diagnosis not present

## 2015-11-04 DIAGNOSIS — E7800 Pure hypercholesterolemia, unspecified: Secondary | ICD-10-CM | POA: Diagnosis not present

## 2015-11-04 DIAGNOSIS — Z809 Family history of malignant neoplasm, unspecified: Secondary | ICD-10-CM | POA: Diagnosis not present

## 2015-11-04 DIAGNOSIS — N289 Disorder of kidney and ureter, unspecified: Secondary | ICD-10-CM | POA: Diagnosis not present

## 2015-11-05 ENCOUNTER — Ambulatory Visit: Payer: Medicare Other

## 2015-11-05 ENCOUNTER — Ambulatory Visit
Admission: RE | Admit: 2015-11-05 | Discharge: 2015-11-05 | Disposition: A | Discharge status: Home / Self Care | Payer: Medicare Other | Source: Ambulatory Visit | Attending: Radiation Oncology | Admitting: Radiation Oncology

## 2015-11-05 ENCOUNTER — Inpatient Hospital Stay: Payer: Medicare Other

## 2015-11-05 DIAGNOSIS — Z7982 Long term (current) use of aspirin: Secondary | ICD-10-CM | POA: Diagnosis not present

## 2015-11-05 DIAGNOSIS — Z510 Encounter for antineoplastic radiation therapy: Secondary | ICD-10-CM | POA: Diagnosis not present

## 2015-11-05 DIAGNOSIS — C50211 Malignant neoplasm of upper-inner quadrant of right female breast: Secondary | ICD-10-CM

## 2015-11-05 DIAGNOSIS — Z853 Personal history of malignant neoplasm of breast: Secondary | ICD-10-CM | POA: Diagnosis not present

## 2015-11-05 DIAGNOSIS — Z79899 Other long term (current) drug therapy: Secondary | ICD-10-CM | POA: Diagnosis not present

## 2015-11-05 DIAGNOSIS — Z170 Estrogen receptor positive status [ER+]: Secondary | ICD-10-CM | POA: Diagnosis not present

## 2015-11-05 DIAGNOSIS — Z9011 Acquired absence of right breast and nipple: Secondary | ICD-10-CM | POA: Diagnosis not present

## 2015-11-05 DIAGNOSIS — Z87891 Personal history of nicotine dependence: Secondary | ICD-10-CM | POA: Diagnosis not present

## 2015-11-05 DIAGNOSIS — I2510 Atherosclerotic heart disease of native coronary artery without angina pectoris: Secondary | ICD-10-CM | POA: Diagnosis not present

## 2015-11-05 DIAGNOSIS — C50411 Malignant neoplasm of upper-outer quadrant of right female breast: Secondary | ICD-10-CM | POA: Diagnosis not present

## 2015-11-05 DIAGNOSIS — N289 Disorder of kidney and ureter, unspecified: Secondary | ICD-10-CM | POA: Diagnosis not present

## 2015-11-05 DIAGNOSIS — C7989 Secondary malignant neoplasm of other specified sites: Secondary | ICD-10-CM | POA: Diagnosis not present

## 2015-11-05 DIAGNOSIS — Z809 Family history of malignant neoplasm, unspecified: Secondary | ICD-10-CM | POA: Diagnosis not present

## 2015-11-05 DIAGNOSIS — E7800 Pure hypercholesterolemia, unspecified: Secondary | ICD-10-CM | POA: Diagnosis not present

## 2015-11-05 DIAGNOSIS — I10 Essential (primary) hypertension: Secondary | ICD-10-CM | POA: Diagnosis not present

## 2015-11-05 LAB — CBC
HCT: 40.2 % (ref 35.0–47.0)
Hemoglobin: 13.6 g/dL (ref 12.0–16.0)
MCH: 30.8 pg (ref 26.0–34.0)
MCHC: 33.9 g/dL (ref 32.0–36.0)
MCV: 90.7 fL (ref 80.0–100.0)
PLATELETS: 367 10*3/uL (ref 150–440)
RBC: 4.43 MIL/uL (ref 3.80–5.20)
RDW: 14.1 % (ref 11.5–14.5)
WBC: 8 10*3/uL (ref 3.6–11.0)

## 2015-11-06 ENCOUNTER — Ambulatory Visit
Admission: RE | Admit: 2015-11-06 | Discharge: 2015-11-06 | Disposition: A | Discharge status: Home / Self Care | Payer: Medicare Other | Source: Ambulatory Visit | Attending: Radiation Oncology | Admitting: Radiation Oncology

## 2015-11-06 ENCOUNTER — Ambulatory Visit: Payer: Medicare Other

## 2015-11-06 DIAGNOSIS — E7800 Pure hypercholesterolemia, unspecified: Secondary | ICD-10-CM | POA: Diagnosis not present

## 2015-11-06 DIAGNOSIS — Z79899 Other long term (current) drug therapy: Secondary | ICD-10-CM | POA: Diagnosis not present

## 2015-11-06 DIAGNOSIS — Z510 Encounter for antineoplastic radiation therapy: Secondary | ICD-10-CM | POA: Diagnosis not present

## 2015-11-06 DIAGNOSIS — C7989 Secondary malignant neoplasm of other specified sites: Secondary | ICD-10-CM | POA: Diagnosis not present

## 2015-11-06 DIAGNOSIS — I10 Essential (primary) hypertension: Secondary | ICD-10-CM | POA: Diagnosis not present

## 2015-11-06 DIAGNOSIS — Z853 Personal history of malignant neoplasm of breast: Secondary | ICD-10-CM | POA: Diagnosis not present

## 2015-11-06 DIAGNOSIS — I2510 Atherosclerotic heart disease of native coronary artery without angina pectoris: Secondary | ICD-10-CM | POA: Diagnosis not present

## 2015-11-06 DIAGNOSIS — N289 Disorder of kidney and ureter, unspecified: Secondary | ICD-10-CM | POA: Diagnosis not present

## 2015-11-06 DIAGNOSIS — Z170 Estrogen receptor positive status [ER+]: Secondary | ICD-10-CM | POA: Diagnosis not present

## 2015-11-06 DIAGNOSIS — Z9011 Acquired absence of right breast and nipple: Secondary | ICD-10-CM | POA: Diagnosis not present

## 2015-11-06 DIAGNOSIS — Z87891 Personal history of nicotine dependence: Secondary | ICD-10-CM | POA: Diagnosis not present

## 2015-11-06 DIAGNOSIS — Z7982 Long term (current) use of aspirin: Secondary | ICD-10-CM | POA: Diagnosis not present

## 2015-11-06 DIAGNOSIS — Z809 Family history of malignant neoplasm, unspecified: Secondary | ICD-10-CM | POA: Diagnosis not present

## 2015-11-07 ENCOUNTER — Ambulatory Visit: Payer: Medicare Other

## 2015-11-07 ENCOUNTER — Ambulatory Visit
Admission: RE | Admit: 2015-11-07 | Discharge: 2015-11-07 | Disposition: A | Discharge status: Home / Self Care | Payer: Medicare Other | Source: Ambulatory Visit | Attending: Radiation Oncology | Admitting: Radiation Oncology

## 2015-11-07 DIAGNOSIS — I2510 Atherosclerotic heart disease of native coronary artery without angina pectoris: Secondary | ICD-10-CM | POA: Diagnosis not present

## 2015-11-07 DIAGNOSIS — E7800 Pure hypercholesterolemia, unspecified: Secondary | ICD-10-CM | POA: Diagnosis not present

## 2015-11-07 DIAGNOSIS — Z7982 Long term (current) use of aspirin: Secondary | ICD-10-CM | POA: Diagnosis not present

## 2015-11-07 DIAGNOSIS — Z510 Encounter for antineoplastic radiation therapy: Secondary | ICD-10-CM | POA: Diagnosis not present

## 2015-11-07 DIAGNOSIS — Z79899 Other long term (current) drug therapy: Secondary | ICD-10-CM | POA: Diagnosis not present

## 2015-11-07 DIAGNOSIS — Z87891 Personal history of nicotine dependence: Secondary | ICD-10-CM | POA: Diagnosis not present

## 2015-11-07 DIAGNOSIS — Z170 Estrogen receptor positive status [ER+]: Secondary | ICD-10-CM | POA: Diagnosis not present

## 2015-11-07 DIAGNOSIS — C7989 Secondary malignant neoplasm of other specified sites: Secondary | ICD-10-CM | POA: Diagnosis not present

## 2015-11-07 DIAGNOSIS — I10 Essential (primary) hypertension: Secondary | ICD-10-CM | POA: Diagnosis not present

## 2015-11-07 DIAGNOSIS — Z9011 Acquired absence of right breast and nipple: Secondary | ICD-10-CM | POA: Diagnosis not present

## 2015-11-07 DIAGNOSIS — Z853 Personal history of malignant neoplasm of breast: Secondary | ICD-10-CM | POA: Diagnosis not present

## 2015-11-07 DIAGNOSIS — N289 Disorder of kidney and ureter, unspecified: Secondary | ICD-10-CM | POA: Diagnosis not present

## 2015-11-07 DIAGNOSIS — Z809 Family history of malignant neoplasm, unspecified: Secondary | ICD-10-CM | POA: Diagnosis not present

## 2015-11-10 ENCOUNTER — Ambulatory Visit
Admission: RE | Admit: 2015-11-10 | Discharge: 2015-11-10 | Disposition: A | Discharge status: Home / Self Care | Payer: Medicare Other | Source: Ambulatory Visit | Attending: Radiation Oncology | Admitting: Radiation Oncology

## 2015-11-10 DIAGNOSIS — N289 Disorder of kidney and ureter, unspecified: Secondary | ICD-10-CM | POA: Diagnosis not present

## 2015-11-10 DIAGNOSIS — I10 Essential (primary) hypertension: Secondary | ICD-10-CM | POA: Diagnosis not present

## 2015-11-10 DIAGNOSIS — C7989 Secondary malignant neoplasm of other specified sites: Secondary | ICD-10-CM | POA: Diagnosis not present

## 2015-11-10 DIAGNOSIS — Z853 Personal history of malignant neoplasm of breast: Secondary | ICD-10-CM | POA: Diagnosis not present

## 2015-11-10 DIAGNOSIS — E7800 Pure hypercholesterolemia, unspecified: Secondary | ICD-10-CM | POA: Diagnosis not present

## 2015-11-10 DIAGNOSIS — Z809 Family history of malignant neoplasm, unspecified: Secondary | ICD-10-CM | POA: Diagnosis not present

## 2015-11-10 DIAGNOSIS — I2510 Atherosclerotic heart disease of native coronary artery without angina pectoris: Secondary | ICD-10-CM | POA: Diagnosis not present

## 2015-11-10 DIAGNOSIS — Z170 Estrogen receptor positive status [ER+]: Secondary | ICD-10-CM | POA: Diagnosis not present

## 2015-11-10 DIAGNOSIS — Z79899 Other long term (current) drug therapy: Secondary | ICD-10-CM | POA: Diagnosis not present

## 2015-11-10 DIAGNOSIS — Z7982 Long term (current) use of aspirin: Secondary | ICD-10-CM | POA: Diagnosis not present

## 2015-11-10 DIAGNOSIS — Z9011 Acquired absence of right breast and nipple: Secondary | ICD-10-CM | POA: Diagnosis not present

## 2015-11-10 DIAGNOSIS — Z510 Encounter for antineoplastic radiation therapy: Secondary | ICD-10-CM | POA: Diagnosis not present

## 2015-11-10 DIAGNOSIS — Z87891 Personal history of nicotine dependence: Secondary | ICD-10-CM | POA: Diagnosis not present

## 2015-11-11 ENCOUNTER — Ambulatory Visit
Admission: RE | Admit: 2015-11-11 | Discharge: 2015-11-11 | Disposition: A | Discharge status: Home / Self Care | Payer: Medicare Other | Source: Ambulatory Visit | Attending: Radiation Oncology | Admitting: Radiation Oncology

## 2015-11-11 DIAGNOSIS — Z853 Personal history of malignant neoplasm of breast: Secondary | ICD-10-CM | POA: Diagnosis not present

## 2015-11-11 DIAGNOSIS — Z79899 Other long term (current) drug therapy: Secondary | ICD-10-CM | POA: Diagnosis not present

## 2015-11-11 DIAGNOSIS — Z809 Family history of malignant neoplasm, unspecified: Secondary | ICD-10-CM | POA: Diagnosis not present

## 2015-11-11 DIAGNOSIS — N289 Disorder of kidney and ureter, unspecified: Secondary | ICD-10-CM | POA: Diagnosis not present

## 2015-11-11 DIAGNOSIS — Z510 Encounter for antineoplastic radiation therapy: Secondary | ICD-10-CM | POA: Diagnosis not present

## 2015-11-11 DIAGNOSIS — Z7982 Long term (current) use of aspirin: Secondary | ICD-10-CM | POA: Diagnosis not present

## 2015-11-11 DIAGNOSIS — Z87891 Personal history of nicotine dependence: Secondary | ICD-10-CM | POA: Diagnosis not present

## 2015-11-11 DIAGNOSIS — E7800 Pure hypercholesterolemia, unspecified: Secondary | ICD-10-CM | POA: Diagnosis not present

## 2015-11-11 DIAGNOSIS — Z170 Estrogen receptor positive status [ER+]: Secondary | ICD-10-CM | POA: Diagnosis not present

## 2015-11-11 DIAGNOSIS — Z9011 Acquired absence of right breast and nipple: Secondary | ICD-10-CM | POA: Diagnosis not present

## 2015-11-11 DIAGNOSIS — I2510 Atherosclerotic heart disease of native coronary artery without angina pectoris: Secondary | ICD-10-CM | POA: Diagnosis not present

## 2015-11-11 DIAGNOSIS — C7989 Secondary malignant neoplasm of other specified sites: Secondary | ICD-10-CM | POA: Diagnosis not present

## 2015-11-11 DIAGNOSIS — I10 Essential (primary) hypertension: Secondary | ICD-10-CM | POA: Diagnosis not present

## 2015-11-12 ENCOUNTER — Ambulatory Visit
Admission: RE | Admit: 2015-11-12 | Discharge: 2015-11-12 | Disposition: A | Discharge status: Home / Self Care | Payer: Medicare Other | Source: Ambulatory Visit | Attending: Radiation Oncology | Admitting: Radiation Oncology

## 2015-11-12 DIAGNOSIS — N289 Disorder of kidney and ureter, unspecified: Secondary | ICD-10-CM | POA: Diagnosis not present

## 2015-11-12 DIAGNOSIS — C50211 Malignant neoplasm of upper-inner quadrant of right female breast: Secondary | ICD-10-CM | POA: Diagnosis not present

## 2015-11-12 DIAGNOSIS — I2510 Atherosclerotic heart disease of native coronary artery without angina pectoris: Secondary | ICD-10-CM | POA: Diagnosis not present

## 2015-11-12 DIAGNOSIS — Z9011 Acquired absence of right breast and nipple: Secondary | ICD-10-CM | POA: Diagnosis not present

## 2015-11-12 DIAGNOSIS — Z79899 Other long term (current) drug therapy: Secondary | ICD-10-CM | POA: Diagnosis not present

## 2015-11-12 DIAGNOSIS — I10 Essential (primary) hypertension: Secondary | ICD-10-CM | POA: Diagnosis not present

## 2015-11-12 DIAGNOSIS — C7989 Secondary malignant neoplasm of other specified sites: Secondary | ICD-10-CM | POA: Diagnosis not present

## 2015-11-12 DIAGNOSIS — Z809 Family history of malignant neoplasm, unspecified: Secondary | ICD-10-CM | POA: Diagnosis not present

## 2015-11-12 DIAGNOSIS — Z510 Encounter for antineoplastic radiation therapy: Secondary | ICD-10-CM | POA: Diagnosis not present

## 2015-11-12 DIAGNOSIS — E7800 Pure hypercholesterolemia, unspecified: Secondary | ICD-10-CM | POA: Diagnosis not present

## 2015-11-12 DIAGNOSIS — Z87891 Personal history of nicotine dependence: Secondary | ICD-10-CM | POA: Diagnosis not present

## 2015-11-12 DIAGNOSIS — Z7982 Long term (current) use of aspirin: Secondary | ICD-10-CM | POA: Diagnosis not present

## 2015-11-12 DIAGNOSIS — Z853 Personal history of malignant neoplasm of breast: Secondary | ICD-10-CM | POA: Diagnosis not present

## 2015-11-12 DIAGNOSIS — Z170 Estrogen receptor positive status [ER+]: Secondary | ICD-10-CM | POA: Diagnosis not present

## 2015-11-13 ENCOUNTER — Ambulatory Visit
Admission: RE | Admit: 2015-11-13 | Discharge: 2015-11-13 | Disposition: A | Discharge status: Home / Self Care | Payer: Medicare Other | Source: Ambulatory Visit | Attending: Radiation Oncology | Admitting: Radiation Oncology

## 2015-11-13 DIAGNOSIS — N289 Disorder of kidney and ureter, unspecified: Secondary | ICD-10-CM | POA: Diagnosis not present

## 2015-11-13 DIAGNOSIS — C7989 Secondary malignant neoplasm of other specified sites: Secondary | ICD-10-CM | POA: Diagnosis not present

## 2015-11-13 DIAGNOSIS — Z79899 Other long term (current) drug therapy: Secondary | ICD-10-CM | POA: Diagnosis not present

## 2015-11-13 DIAGNOSIS — I2510 Atherosclerotic heart disease of native coronary artery without angina pectoris: Secondary | ICD-10-CM | POA: Diagnosis not present

## 2015-11-13 DIAGNOSIS — Z7982 Long term (current) use of aspirin: Secondary | ICD-10-CM | POA: Diagnosis not present

## 2015-11-13 DIAGNOSIS — Z170 Estrogen receptor positive status [ER+]: Secondary | ICD-10-CM | POA: Diagnosis not present

## 2015-11-13 DIAGNOSIS — Z87891 Personal history of nicotine dependence: Secondary | ICD-10-CM | POA: Diagnosis not present

## 2015-11-13 DIAGNOSIS — Z9011 Acquired absence of right breast and nipple: Secondary | ICD-10-CM | POA: Diagnosis not present

## 2015-11-13 DIAGNOSIS — I10 Essential (primary) hypertension: Secondary | ICD-10-CM | POA: Diagnosis not present

## 2015-11-13 DIAGNOSIS — E7800 Pure hypercholesterolemia, unspecified: Secondary | ICD-10-CM | POA: Diagnosis not present

## 2015-11-13 DIAGNOSIS — Z853 Personal history of malignant neoplasm of breast: Secondary | ICD-10-CM | POA: Diagnosis not present

## 2015-11-13 DIAGNOSIS — Z510 Encounter for antineoplastic radiation therapy: Secondary | ICD-10-CM | POA: Diagnosis not present

## 2015-11-13 DIAGNOSIS — Z809 Family history of malignant neoplasm, unspecified: Secondary | ICD-10-CM | POA: Diagnosis not present

## 2015-11-14 ENCOUNTER — Ambulatory Visit
Admission: RE | Admit: 2015-11-14 | Discharge: 2015-11-14 | Disposition: A | Discharge status: Home / Self Care | Payer: Medicare Other | Source: Ambulatory Visit | Attending: Radiation Oncology | Admitting: Radiation Oncology

## 2015-11-14 DIAGNOSIS — Z853 Personal history of malignant neoplasm of breast: Secondary | ICD-10-CM | POA: Diagnosis not present

## 2015-11-14 DIAGNOSIS — Z809 Family history of malignant neoplasm, unspecified: Secondary | ICD-10-CM | POA: Diagnosis not present

## 2015-11-14 DIAGNOSIS — I2510 Atherosclerotic heart disease of native coronary artery without angina pectoris: Secondary | ICD-10-CM | POA: Diagnosis not present

## 2015-11-14 DIAGNOSIS — Z7982 Long term (current) use of aspirin: Secondary | ICD-10-CM | POA: Diagnosis not present

## 2015-11-14 DIAGNOSIS — Z510 Encounter for antineoplastic radiation therapy: Secondary | ICD-10-CM | POA: Diagnosis not present

## 2015-11-14 DIAGNOSIS — Z79899 Other long term (current) drug therapy: Secondary | ICD-10-CM | POA: Diagnosis not present

## 2015-11-14 DIAGNOSIS — E7800 Pure hypercholesterolemia, unspecified: Secondary | ICD-10-CM | POA: Diagnosis not present

## 2015-11-14 DIAGNOSIS — Z170 Estrogen receptor positive status [ER+]: Secondary | ICD-10-CM | POA: Diagnosis not present

## 2015-11-14 DIAGNOSIS — Z9011 Acquired absence of right breast and nipple: Secondary | ICD-10-CM | POA: Diagnosis not present

## 2015-11-14 DIAGNOSIS — Z87891 Personal history of nicotine dependence: Secondary | ICD-10-CM | POA: Diagnosis not present

## 2015-11-14 DIAGNOSIS — C7989 Secondary malignant neoplasm of other specified sites: Secondary | ICD-10-CM | POA: Diagnosis not present

## 2015-11-14 DIAGNOSIS — N289 Disorder of kidney and ureter, unspecified: Secondary | ICD-10-CM | POA: Diagnosis not present

## 2015-11-14 DIAGNOSIS — I10 Essential (primary) hypertension: Secondary | ICD-10-CM | POA: Diagnosis not present

## 2015-11-17 ENCOUNTER — Ambulatory Visit
Admission: RE | Admit: 2015-11-17 | Discharge: 2015-11-17 | Disposition: A | Discharge status: Home / Self Care | Payer: Medicare Other | Source: Ambulatory Visit | Attending: Radiation Oncology | Admitting: Radiation Oncology

## 2015-11-17 DIAGNOSIS — Z510 Encounter for antineoplastic radiation therapy: Secondary | ICD-10-CM | POA: Diagnosis not present

## 2015-11-17 DIAGNOSIS — Z809 Family history of malignant neoplasm, unspecified: Secondary | ICD-10-CM | POA: Diagnosis not present

## 2015-11-17 DIAGNOSIS — I10 Essential (primary) hypertension: Secondary | ICD-10-CM | POA: Diagnosis not present

## 2015-11-17 DIAGNOSIS — Z87891 Personal history of nicotine dependence: Secondary | ICD-10-CM | POA: Diagnosis not present

## 2015-11-17 DIAGNOSIS — Z7982 Long term (current) use of aspirin: Secondary | ICD-10-CM | POA: Diagnosis not present

## 2015-11-17 DIAGNOSIS — E7800 Pure hypercholesterolemia, unspecified: Secondary | ICD-10-CM | POA: Diagnosis not present

## 2015-11-17 DIAGNOSIS — I2510 Atherosclerotic heart disease of native coronary artery without angina pectoris: Secondary | ICD-10-CM | POA: Diagnosis not present

## 2015-11-17 DIAGNOSIS — Z170 Estrogen receptor positive status [ER+]: Secondary | ICD-10-CM | POA: Diagnosis not present

## 2015-11-17 DIAGNOSIS — Z79899 Other long term (current) drug therapy: Secondary | ICD-10-CM | POA: Diagnosis not present

## 2015-11-17 DIAGNOSIS — C7989 Secondary malignant neoplasm of other specified sites: Secondary | ICD-10-CM | POA: Diagnosis not present

## 2015-11-17 DIAGNOSIS — Z853 Personal history of malignant neoplasm of breast: Secondary | ICD-10-CM | POA: Diagnosis not present

## 2015-11-17 DIAGNOSIS — N289 Disorder of kidney and ureter, unspecified: Secondary | ICD-10-CM | POA: Diagnosis not present

## 2015-11-17 DIAGNOSIS — Z9011 Acquired absence of right breast and nipple: Secondary | ICD-10-CM | POA: Diagnosis not present

## 2015-11-18 ENCOUNTER — Telehealth: Payer: Self-pay | Admitting: *Deleted

## 2015-11-18 ENCOUNTER — Ambulatory Visit
Admission: RE | Admit: 2015-11-18 | Discharge: 2015-11-18 | Disposition: A | Discharge status: Home / Self Care | Payer: Medicare Other | Source: Ambulatory Visit | Attending: Radiation Oncology | Admitting: Radiation Oncology

## 2015-11-18 DIAGNOSIS — Z170 Estrogen receptor positive status [ER+]: Secondary | ICD-10-CM | POA: Diagnosis not present

## 2015-11-18 DIAGNOSIS — I10 Essential (primary) hypertension: Secondary | ICD-10-CM | POA: Diagnosis not present

## 2015-11-18 DIAGNOSIS — Z79899 Other long term (current) drug therapy: Secondary | ICD-10-CM | POA: Diagnosis not present

## 2015-11-18 DIAGNOSIS — Z853 Personal history of malignant neoplasm of breast: Secondary | ICD-10-CM | POA: Diagnosis not present

## 2015-11-18 DIAGNOSIS — C7989 Secondary malignant neoplasm of other specified sites: Secondary | ICD-10-CM | POA: Diagnosis not present

## 2015-11-18 DIAGNOSIS — Z87891 Personal history of nicotine dependence: Secondary | ICD-10-CM | POA: Diagnosis not present

## 2015-11-18 DIAGNOSIS — Z510 Encounter for antineoplastic radiation therapy: Secondary | ICD-10-CM | POA: Diagnosis not present

## 2015-11-18 DIAGNOSIS — Z809 Family history of malignant neoplasm, unspecified: Secondary | ICD-10-CM | POA: Diagnosis not present

## 2015-11-18 DIAGNOSIS — Z7982 Long term (current) use of aspirin: Secondary | ICD-10-CM | POA: Diagnosis not present

## 2015-11-18 DIAGNOSIS — N289 Disorder of kidney and ureter, unspecified: Secondary | ICD-10-CM | POA: Diagnosis not present

## 2015-11-18 DIAGNOSIS — E7800 Pure hypercholesterolemia, unspecified: Secondary | ICD-10-CM | POA: Diagnosis not present

## 2015-11-18 DIAGNOSIS — I2510 Atherosclerotic heart disease of native coronary artery without angina pectoris: Secondary | ICD-10-CM | POA: Diagnosis not present

## 2015-11-18 DIAGNOSIS — Z9011 Acquired absence of right breast and nipple: Secondary | ICD-10-CM | POA: Diagnosis not present

## 2015-11-18 NOTE — Telephone Encounter (Signed)
Patient finished radiation tx today. When does Dr. Rogue Bussing want pt to start Aromatase inhibitor?

## 2015-11-19 ENCOUNTER — Telehealth: Payer: Self-pay | Admitting: Internal Medicine

## 2015-11-19 NOTE — Telephone Encounter (Signed)
Please inform patient that she could start taking her aromatase inhibitor; one week after the finish of radiation.

## 2015-11-20 NOTE — Telephone Encounter (Signed)
Patient instructed to start the AI on Tuesday November 24, 2015.  She states that she has a tiny area of skin irritation at breast cancer radiation site. I encourage the patient to continue to using aquaphor as previously directed. I explained that this area may time to heal.

## 2015-11-20 NOTE — Telephone Encounter (Signed)
Pt to start 11/25/15

## 2015-12-25 ENCOUNTER — Inpatient Hospital Stay: Payer: Medicare Other | Attending: Internal Medicine | Admitting: Internal Medicine

## 2015-12-25 ENCOUNTER — Encounter: Payer: Self-pay | Admitting: Radiation Oncology

## 2015-12-25 ENCOUNTER — Ambulatory Visit
Admission: RE | Admit: 2015-12-25 | Discharge: 2015-12-25 | Disposition: A | Discharge status: Home / Self Care | Payer: Medicare Other | Source: Ambulatory Visit | Attending: Radiation Oncology | Admitting: Radiation Oncology

## 2015-12-25 VITALS — BP 172/74 | HR 59 | Temp 97.0°F | Resp 18 | Ht 60.0 in | Wt 155.4 lb

## 2015-12-25 DIAGNOSIS — I2510 Atherosclerotic heart disease of native coronary artery without angina pectoris: Secondary | ICD-10-CM | POA: Diagnosis not present

## 2015-12-25 DIAGNOSIS — R232 Flushing: Secondary | ICD-10-CM

## 2015-12-25 DIAGNOSIS — M129 Arthropathy, unspecified: Secondary | ICD-10-CM | POA: Diagnosis not present

## 2015-12-25 DIAGNOSIS — Z170 Estrogen receptor positive status [ER+]: Secondary | ICD-10-CM | POA: Insufficient documentation

## 2015-12-25 DIAGNOSIS — N2889 Other specified disorders of kidney and ureter: Secondary | ICD-10-CM

## 2015-12-25 DIAGNOSIS — K869 Disease of pancreas, unspecified: Secondary | ICD-10-CM | POA: Insufficient documentation

## 2015-12-25 DIAGNOSIS — C50911 Malignant neoplasm of unspecified site of right female breast: Secondary | ICD-10-CM

## 2015-12-25 DIAGNOSIS — R911 Solitary pulmonary nodule: Secondary | ICD-10-CM | POA: Insufficient documentation

## 2015-12-25 DIAGNOSIS — C50211 Malignant neoplasm of upper-inner quadrant of right female breast: Secondary | ICD-10-CM

## 2015-12-25 DIAGNOSIS — Z923 Personal history of irradiation: Secondary | ICD-10-CM | POA: Insufficient documentation

## 2015-12-25 DIAGNOSIS — M818 Other osteoporosis without current pathological fracture: Secondary | ICD-10-CM | POA: Diagnosis not present

## 2015-12-25 DIAGNOSIS — E7800 Pure hypercholesterolemia, unspecified: Secondary | ICD-10-CM | POA: Insufficient documentation

## 2015-12-25 DIAGNOSIS — F17210 Nicotine dependence, cigarettes, uncomplicated: Secondary | ICD-10-CM | POA: Insufficient documentation

## 2015-12-25 DIAGNOSIS — R252 Cramp and spasm: Secondary | ICD-10-CM | POA: Diagnosis not present

## 2015-12-25 DIAGNOSIS — Z79899 Other long term (current) drug therapy: Secondary | ICD-10-CM | POA: Diagnosis not present

## 2015-12-25 DIAGNOSIS — I10 Essential (primary) hypertension: Secondary | ICD-10-CM | POA: Insufficient documentation

## 2015-12-25 DIAGNOSIS — Z7982 Long term (current) use of aspirin: Secondary | ICD-10-CM | POA: Insufficient documentation

## 2015-12-25 DIAGNOSIS — Z79811 Long term (current) use of aromatase inhibitors: Secondary | ICD-10-CM | POA: Diagnosis not present

## 2015-12-25 DIAGNOSIS — C7989 Secondary malignant neoplasm of other specified sites: Secondary | ICD-10-CM

## 2015-12-25 NOTE — Patient Instructions (Signed)
Continue taking your calcium with the vitamin d to prevent further bone loss.    Anastrozole tablets What is this medicine? ANASTROZOLE (an AS troe zole) is used to treat breast cancer in women who have gone through menopause. Some types of breast cancer depend on estrogen to grow, and this medicine can stop tumor growth by blocking estrogen production. This medicine may be used for other purposes; ask your health care provider or pharmacist if you have questions. What should I tell my health care provider before I take this medicine? They need to know if you have any of these conditions: -liver disease -an unusual or allergic reaction to anastrozole, other medicines, foods, dyes, or preservatives -pregnant or trying to get pregnant -breast-feeding How should I use this medicine? Take this medicine by mouth with a glass of water. Follow the directions on the prescription label. You can take this medicine with or without food. Take your doses at regular intervals. Do not take your medicine more often than directed. Do not stop taking except on the advice of your doctor or health care professional. Talk to your pediatrician regarding the use of this medicine in children. Special care may be needed. Overdosage: If you think you have taken too much of this medicine contact a poison control center or emergency room at once. NOTE: This medicine is only for you. Do not share this medicine with others. What if I miss a dose? If you miss a dose, take it as soon as you can. If it is almost time for your next dose, take only that dose. Do not take double or extra doses. What may interact with this medicine? Do not take this medicine with any of the following medications: -female hormones, like estrogens or progestins and birth control pills This medicine may also interact with the following medications: -tamoxifen This list may not describe all possible interactions. Give your health care provider a  list of all the medicines, herbs, non-prescription drugs, or dietary supplements you use. Also tell them if you smoke, drink alcohol, or use illegal drugs. Some items may interact with your medicine. What should I watch for while using this medicine? Visit your doctor or health care professional for regular checks on your progress. Let your doctor or health care professional know about any unusual vaginal bleeding. Do not treat yourself for diarrhea, nausea, vomiting or other side effects. Ask your doctor or health care professional for advice. What side effects may I notice from receiving this medicine? Side effects that you should report to your doctor or health care professional as soon as possible: -allergic reactions like skin rash, itching or hives, swelling of the face, lips, or tongue -any new or unusual symptoms -breathing problems -chest pain -leg pain or swelling -vomiting Side effects that usually do not require medical attention (report to your doctor or health care professional if they continue or are bothersome): -back or bone pain -cough, or throat infection -diarrhea or constipation -dizziness -headache -hot flashes -loss of appetite -nausea -sweating -weakness and tiredness -weight gain This list may not describe all possible side effects. Call your doctor for medical advice about side effects. You may report side effects to FDA at 1-800-FDA-1088. Where should I keep my medicine? Keep out of the reach of children. Store at room temperature between 20 and 25 degrees C (68 and 77 degrees F). Throw away any unused medicine after the expiration date. NOTE: This sheet is a summary. It may not cover all possible information.  If you have questions about this medicine, talk to your doctor, pharmacist, or health care provider.    2016, Elsevier/Gold Standard. (2008-01-26 16:31:52)

## 2015-12-25 NOTE — Progress Notes (Signed)
[place] OFFICE PROGRESS NOTE  Patient Care Team: Glean Hess, MD as PCP - General (Family Medicine)   SUMMARY OF ONCOLOGIC HISTORY:  # 2002- Right Breast" Predominant DCIS- cribriform/low grade; 1.5cm with 5% early invasion"; ER 74%; PR-NEG; her 2 Neg; S/P Mastec & ALND [no residual ca/dcis] & Recon]; NO TAM  # 2016- CHEST WALL RECURRENCE [~2.5CM] s/p RIGHT excisional Biopsy; positive deep margin [ Dr.Cooper] Cribriform type; ER/PR >90%; HER 2 neu-NEG; s/p RT; Arimidex  # Lung nodule [23m LL; Oct 2016]; Left kidney ? 2.8cm Complex Cyst- [place]-NEG for mass; Pancreas 10 mm cyst  # OSTEOPOROSIS [BMD, OCT 2016] ca+ vit D   INTERVAL HISTORY:  Donna Santos 82y.o. female patient with a recent diagnosis of isolated chest wall recurrence/not resectable ER/PR positive HER-2/neu negative  Currently status post radiation finished in of December 2016; on Arimidexis here for follow-up  Patient does not complain of any new lumps or bumps. She is mildly sore at the previous excision site. Denies any hot flashes. Denies any joint pains muscle pain.  Patient denies any symptoms at this time.  REVIEW OF SYSTEMS:  A complete 10 point review of system is done which is negative except mentioned above/history of present illness.   PAST MEDICAL HISTORY :  Past Medical History  Diagnosis Date  . Impaired renal function   . Arteriosclerosis of coronary artery   . Essential hypertension   . [place] of breast ([place]   . Muscle spasms of neck   . Arthritis of shoulder region   . Tobacco abuse   . Hypercholesteremia   . [place] cancer ([place]   . Cataract     PAST SURGICAL HISTORY :   Past Surgical History  Procedure Laterality Date  . Coronary artery bypass graft  2006  . Mastectomy, radical Right 2002  . Total abdominal hysterectomy    . Carotid endarterectomy    . Mass excision Right 08/28/2015    Procedure: EXCISION MASS; remove massive right chest wall;   Surgeon: RFlorene Glen MD;  Location: [facility] ORS;  Service: General;  Laterality: Right;    FAMILY HISTORY :   Family History  Problem Relation Age of Onset  . Hypertension Father   . Cancer Father     SOCIAL HISTORY:   Social History  Substance Use Topics  . Smoking status: Former Smoker    Quit date: 10/04/1952  . Smokeless tobacco: Never Used  . Alcohol Use: No    ALLERGIES:  is allergic to ace inhibitors.  MEDICATIONS:  Current Outpatient Prescriptions  Medication Sig Dispense Refill  . amLODipine (NORVASC) 5 MG tablet TAKE 1 TABLET BY MOUTH TWICE DAILY 60 tablet 5  . anastrozole (ARIMIDEX) 1 MG tablet Take 1 tablet (1 mg total) by mouth daily. 90 tablet 3  . aspirin EC 81 MG tablet Take 81 mg by mouth daily.    . chlorthalidone (HYGROTON) 25 MG tablet Take 1 tablet by mouth daily.    . cholecalciferol (VITAMIN D) 1000 UNITS tablet Take 1,000 Units by mouth daily.    .Marland Kitchenlatanoprost (XALATAN) 0.005 % ophthalmic solution INT 1 GTT INTO EACH EYE QHS.  6  . losartan (COZAAR) 25 MG tablet TAKE 1 TABLET EVERY DAY 30 tablet 5  . metoprolol (LOPRESSOR) 100 MG tablet Take 100 mg by mouth 2 (two) times daily.     . nitroGLYCERIN (NITROSTAT) 0.4 MG SL tablet Place 0.4 mg under the tongue every 5 (five) minutes  as needed for chest pain.    Marland Kitchen timolol (TIMOPTIC) 0.25 % ophthalmic solution INSTILL ONE DROP INTO BOTH EYES EVERY MORNING.  6  . Calcium Carb-Cholecalciferol (CALCIUM 600+D) 600-800 MG-UNIT TABS Take 1 tablet by mouth daily. Reported on 12/25/2015     No current facility-administered medications for this visit.    PHYSICAL EXAMINATION: ECOG PERFORMANCE STATUS: 0 - Asymptomatic  BP 172/74 mmHg  Pulse 59  Temp(Src) 97 F (36.1 C) (Tympanic)  Resp 18  Ht 5' (1.524 m)  Wt 155 lb 6.8 oz (70.5 kg)  BMI 30.35 kg/m2  Filed Weights   12/25/15 1057  Weight: 155 lb 6.8 oz (70.5 kg)    GENERAL: Well-nourished well-developed; Alert, no distress and comfortable.    Accompanied by her daughterShe is walking herself. EYES: no pallor or icterus OROPHARYNX: no thrush or ulceration; good dentition  NECK: supple, no masses felt LYMPH:  no palpable lymphadenopathy in the cervical, axillary or inguinal regions LUNGS: clear to auscultation and  No wheeze or crackles HEART/CVS: regular rate & rhythm and no murmurs; No lower extremity edema ABDOMEN:abdomen soft, non-tender and normal bowel sounds Musculoskeletal:no cyanosis of digits and no clubbing  PSYCH: alert & oriented x 3 with fluent speech NEURO: no focal motor/sensory deficits SKIN:  no rashes or significant lesions; chest wall- above the right mastectomy incision; no masses felt.   LABORATORY DATA:  I have reviewed the data as listed    Component Value Date/Time   NA 137 08/28/2015 0827   NA 138 07/31/2015 1024   K 3.6 08/28/2015 0827   CL 95* 08/27/2015 1304   CO2 28 08/27/2015 1304   GLUCOSE 131* 08/28/2015 0827   GLUCOSE 124* 07/31/2015 1024   BUN 18 08/27/2015 1304   BUN 18 07/31/2015 1024   CREATININE 1.01* 08/27/2015 1304   CALCIUM 9.5 08/27/2015 1304   PROT 7.4 07/31/2015 1024   ALBUMIN 4.5 07/31/2015 1024   AST 15 07/31/2015 1024   ALT 12 07/31/2015 1024   ALKPHOS 44 07/31/2015 1024   BILITOT 0.4 07/31/2015 1024   GFRNONAA 48* 08/27/2015 1304   GFRAA 56* 08/27/2015 1304    No results found for: SPEP, UPEP  Lab Results  Component Value Date   WBC 8.0 11/05/2015   NEUTROABS 8.0* 08/27/2015   HGB 13.6 11/05/2015   HCT 40.2 11/05/2015   MCV 90.7 11/05/2015   PLT 367 11/05/2015      Chemistry      Component Value Date/Time   NA 137 08/28/2015 0827   NA 138 07/31/2015 1024   K 3.6 08/28/2015 0827   CL 95* 08/27/2015 1304   CO2 28 08/27/2015 1304   BUN 18 08/27/2015 1304   BUN 18 07/31/2015 1024   CREATININE 1.01* 08/27/2015 1304      Component Value Date/Time   CALCIUM 9.5 08/27/2015 1304   ALKPHOS 44 07/31/2015 1024   AST 15 07/31/2015 1024   ALT 12 07/31/2015  1024   BILITOT 0.4 07/31/2015 1024       RADIOGRAPHIC STUDIES: I have personally reviewed the radiological images as listed and agreed with the findings in the report. No results found.   ASSESSMENT & PLAN:   # RIGHT IPSILATERAL CHEST WALL RECURRENCE STATUS post mastectomy 2002- Cribiform type; ER/PR >90%; Her2 Neu-NEG. Status post radiation. I currently do not feel a mass on the right chest wall. Currently on Arimidex. She is tolerating well without any major side effects.clinically no evidence of progression noted.  # Patient has osteoporosis.  Recommend for now calcium and vitamin D twice a day. We'll plan repeat bone density test in oct 2017.   # patient will follow-up with me in approximately 4 months/no labs needed.  # 15 minutes face-to-face with the patient discussing the above plan of care; more than 50% of time spent on prognosis/ natural history; counseling and coordination.      Cammie Sickle, MD 12/25/2015 11:12 AM

## 2015-12-25 NOTE — Progress Notes (Signed)
Radiation Oncology Follow up Note  Name: Donna Santos   Date:   12/25/2015 MRN:  [id number] DOB: 04/15/1925    This 82 y.o. female presents to the clinic today for one-month follow-up status post radiation therapy to her right chest wall. 4 nodular chest wall recurrence status post modified radical mastectomy back in 2002  REFERRING PROVIDER: Berglund, Laura H, MD  HPI: Patient is a pleasant 82-year-old female now 1 month out having completed radiation therapy to her right chest wall for a nodular recurrence status post modified radical mastectomy 2002 48 invasive mammary carcinoma. Tumor was ER positive PR borderline HER-2/neu negative. Biopsy of this recurrent lesion showed low-grade invasive carcinoma consistent with cribriform carcinoma of mammary origin. Tumor this time was strongly ER/PR positive HER-2/neu negative. She is seen today in routine follow-up is doing well she specifically denies chest wall tenderness or any nodularity. She does occasionally have some itching of the skin continues to use some Aquaphor cream for that.   COMPLICATIONS OF TREATMENT: none  FOLLOW UP COMPLIANCE: keeps appointments   PHYSICAL EXAM:  There were no vitals taken for this visit. Well-developed elderly female in NAD she is status post right modified radical mastectomy. Skin is still somewhat hyperpigmented no evidence of mass or nodularity in the right chest wall scar is noted left breast is free of dominant mass or nodularity in 2 positions examined. No axillary or supraclavicular adenopathy is noted. Well-developed well-nourished patient in NAD. HEENT reveals PERLA, EOMI, discs not visualized.  Oral cavity is clear. No oral mucosal lesions are identified. Neck is clear without evidence of cervical or supraclavicular adenopathy. Lungs are clear to A&P. Cardiac examination is essentially unremarkable with regular rate and rhythm without murmur rub or thrill. Abdomen is benign with no organomegaly or  masses noted. Motor sensory and DTR levels are equal and symmetric in the upper and lower extremities. Cranial nerves II through XII are grossly intact. Proprioception is intact. No peripheral adenopathy or edema is identified. No motor or sensory levels are noted. Crude visual fields are within normal range.  RADIOLOGY RESULTS: No current films for review  PLAN: At the present time she continues to do well 1 month out from radiation therapy. I've asked to see her back in approximate 4 months and will see her the same day as medical oncology. I am please were overall progress. She continues on anastrozole without side effect. Patient and daughter know to call at anytime with any concerns.  I would like to take this opportunity for allowing me to participate in the care of your patient..    , S., MD   

## 2015-12-25 NOTE — Progress Notes (Signed)
The patient is here for her breast cancer follow-up with Dr. Rogue Bussing and Dr. Massie Maroon. She has no medical complaints. She is tolerating her arimidex without difficulties. She denies any hot flashes or bony pain. She takes vitamin D 1000 units daily; however is not consistent with taking her calcium on a daily basis. I encouraged her to take the calcium along with the vitamin D to decrease her risk for further bone loss. She gave verbal understanding.

## 2016-02-12 DIAGNOSIS — Z961 Presence of intraocular lens: Secondary | ICD-10-CM | POA: Diagnosis not present

## 2016-02-12 DIAGNOSIS — H401131 Primary open-angle glaucoma, bilateral, mild stage: Secondary | ICD-10-CM | POA: Diagnosis not present

## 2016-02-21 ENCOUNTER — Other Ambulatory Visit: Payer: Self-pay | Admitting: Internal Medicine

## 2016-03-18 ENCOUNTER — Other Ambulatory Visit: Payer: Self-pay | Admitting: Internal Medicine

## 2016-03-20 DIAGNOSIS — R600 Localized edema: Secondary | ICD-10-CM | POA: Diagnosis not present

## 2016-03-20 DIAGNOSIS — I700 Atherosclerosis of aorta: Secondary | ICD-10-CM | POA: Diagnosis not present

## 2016-03-20 DIAGNOSIS — R079 Chest pain, unspecified: Secondary | ICD-10-CM | POA: Diagnosis not present

## 2016-03-20 DIAGNOSIS — I160 Hypertensive urgency: Secondary | ICD-10-CM | POA: Diagnosis not present

## 2016-03-20 DIAGNOSIS — R911 Solitary pulmonary nodule: Secondary | ICD-10-CM | POA: Diagnosis not present

## 2016-03-20 DIAGNOSIS — R531 Weakness: Secondary | ICD-10-CM | POA: Diagnosis not present

## 2016-03-20 DIAGNOSIS — R0789 Other chest pain: Secondary | ICD-10-CM | POA: Diagnosis not present

## 2016-03-20 DIAGNOSIS — M898X9 Other specified disorders of bone, unspecified site: Secondary | ICD-10-CM | POA: Diagnosis not present

## 2016-03-20 DIAGNOSIS — Z87891 Personal history of nicotine dependence: Secondary | ICD-10-CM | POA: Diagnosis not present

## 2016-03-20 DIAGNOSIS — M47819 Spondylosis without myelopathy or radiculopathy, site unspecified: Secondary | ICD-10-CM | POA: Diagnosis not present

## 2016-03-20 DIAGNOSIS — R0602 Shortness of breath: Secondary | ICD-10-CM | POA: Diagnosis not present

## 2016-03-20 DIAGNOSIS — C50919 Malignant neoplasm of unspecified site of unspecified female breast: Secondary | ICD-10-CM | POA: Diagnosis not present

## 2016-03-20 DIAGNOSIS — R938 Abnormal findings on diagnostic imaging of other specified body structures: Secondary | ICD-10-CM | POA: Diagnosis not present

## 2016-03-20 DIAGNOSIS — I10 Essential (primary) hypertension: Secondary | ICD-10-CM | POA: Diagnosis not present

## 2016-03-20 DIAGNOSIS — R5383 Other fatigue: Secondary | ICD-10-CM | POA: Diagnosis not present

## 2016-03-20 DIAGNOSIS — K449 Diaphragmatic hernia without obstruction or gangrene: Secondary | ICD-10-CM | POA: Diagnosis not present

## 2016-03-20 DIAGNOSIS — I2510 Atherosclerotic heart disease of native coronary artery without angina pectoris: Secondary | ICD-10-CM | POA: Diagnosis not present

## 2016-03-20 DIAGNOSIS — H409 Unspecified glaucoma: Secondary | ICD-10-CM | POA: Diagnosis not present

## 2016-03-20 DIAGNOSIS — Z7982 Long term (current) use of aspirin: Secondary | ICD-10-CM | POA: Diagnosis not present

## 2016-03-21 DIAGNOSIS — R4189 Other symptoms and signs involving cognitive functions and awareness: Secondary | ICD-10-CM | POA: Diagnosis not present

## 2016-03-25 DIAGNOSIS — Z7982 Long term (current) use of aspirin: Secondary | ICD-10-CM | POA: Diagnosis not present

## 2016-03-25 DIAGNOSIS — I10 Essential (primary) hypertension: Secondary | ICD-10-CM | POA: Diagnosis not present

## 2016-03-25 DIAGNOSIS — I2510 Atherosclerotic heart disease of native coronary artery without angina pectoris: Secondary | ICD-10-CM | POA: Diagnosis not present

## 2016-03-25 DIAGNOSIS — H409 Unspecified glaucoma: Secondary | ICD-10-CM | POA: Diagnosis not present

## 2016-03-25 DIAGNOSIS — Z853 Personal history of malignant neoplasm of breast: Secondary | ICD-10-CM | POA: Diagnosis not present

## 2016-03-25 DIAGNOSIS — M545 Low back pain: Secondary | ICD-10-CM | POA: Diagnosis not present

## 2016-03-29 ENCOUNTER — Ambulatory Visit (INDEPENDENT_AMBULATORY_CARE_PROVIDER_SITE_OTHER): Payer: Medicare Other | Admitting: Internal Medicine

## 2016-03-29 ENCOUNTER — Encounter: Payer: Self-pay | Admitting: Internal Medicine

## 2016-03-29 ENCOUNTER — Other Ambulatory Visit: Payer: Self-pay | Admitting: Internal Medicine

## 2016-03-29 VITALS — BP 136/70 | HR 56 | Temp 98.3°F | Resp 16 | Ht 60.0 in | Wt 154.0 lb

## 2016-03-29 DIAGNOSIS — I10 Essential (primary) hypertension: Secondary | ICD-10-CM | POA: Diagnosis not present

## 2016-03-29 DIAGNOSIS — R2689 Other abnormalities of gait and mobility: Secondary | ICD-10-CM | POA: Diagnosis not present

## 2016-03-29 DIAGNOSIS — R4189 Other symptoms and signs involving cognitive functions and awareness: Secondary | ICD-10-CM | POA: Diagnosis not present

## 2016-03-29 NOTE — Progress Notes (Signed)
Date:  03/29/2016   Name:  Donna Santos   DOB:  10-14-1925   MRN:  [id number]  Hospitalized 03/20/16 for 23 hours at [facility] for hypertensive urgency.  Her labs were stable.  CXR normal.  CT chest neg for PE.  Blood pressure came down with home medications. Aricept was ordered for mild cognitive impairment but she did not get the prescription (not sure why not).  Chief Complaint: Hospitalization Follow-up; Dizziness; and Memory Loss Dizziness This is a new problem. The current episode started 1 to 4 weeks ago. The problem occurs intermittently. The problem has been waxing and waning. Pertinent negatives include no abdominal pain, chest pain, chills, coughing, diaphoresis, fever, headaches, rash, sore throat, visual change or weakness. Associated symptoms comments: She does not have vertigo or nausea, no sinus or ear congestion.. The symptoms are aggravated by walking (only occurs intermittently - feels off balance when walking).   Memory Loss - when in [facility], the history seemed to indicate some mild memory loss.  They thought perhaps the blood pressure shot up because she forgot to take her medication. Her daughter in law who accompanies her reports that STx came out and did a MMSE and that she scored very well.  The daughter in law also reports that Ms. Bucio chose not to drive on several occasions when she was feeling off balance.  PTx is scheduled to come out to her house and work with her.  She has a cane to use outside and holds onto the wall inside.  Review of Systems  Constitutional: Negative for fever, chills, diaphoresis, appetite change and unexpected weight change.  HENT: Negative for ear pain, hearing loss, sore throat and tinnitus.   Eyes: Negative for visual disturbance.  Respiratory: Negative for cough, chest tightness, shortness of breath and wheezing.   Cardiovascular: Negative for chest pain, palpitations and leg swelling.  Gastrointestinal: Negative for abdominal pain and blood  in stool.  Musculoskeletal: Positive for gait problem (sense of imbalance with no falls).  Skin: Negative for rash.  Neurological: Positive for light-headedness. Negative for dizziness, tremors, weakness and headaches.  Psychiatric/Behavioral: Negative for confusion, sleep disturbance, dysphoric mood and decreased concentration. The patient is not nervous/anxious.     Patient Active Problem List   Diagnosis Date Noted  . Cognitive decline 03/21/2016  . Chest wall recurrence of right breast cancer ([place]) 09/09/2015  . Impaired renal function 05/09/2015  . Arteriosclerosis of coronary artery 05/09/2015  . Essential (primary) hypertension 05/09/2015  . Personal history of malignant neoplasm of breast 05/09/2015  . Muscle spasms of neck 05/09/2015  . Arthritis of shoulder region, degenerative 05/09/2015  . Current tobacco use 05/09/2015  . Hypercholesteremia 04/20/2012  . Carotid artery disease ([place]) 05/28/2005    Prior to Admission medications   Medication Sig Start Date End Date Taking? Authorizing Provider  amLODipine (NORVASC) 5 MG tablet TAKE 1 TABLET BY MOUTH TWICE DAILY 09/25/15  Yes Glean Hess, MD  anastrozole (ARIMIDEX) 1 MG tablet Take 1 tablet (1 mg total) by mouth daily. 09/09/15  Yes Cammie Sickle, MD  aspirin EC 81 MG tablet Take 81 mg by mouth daily.   Yes Historical Provider, MD  Calcium Carb-Cholecalciferol (CALCIUM 600+D) 600-800 MG-UNIT TABS Take 1 tablet by mouth daily. Reported on 12/25/2015   Yes Historical Provider, MD  chlorthalidone (HYGROTON) 25 MG tablet TAKE 1 TABLET BY MOUTH DAILY 03/18/16  Yes Glean Hess, MD  donepezil (ARICEPT) 5 MG tablet Take 1 tablet by  mouth daily. 03/21/16  Yes Historical Provider, MD  latanoprost (XALATAN) 0.005 % ophthalmic solution INT 1 GTT INTO EACH EYE QHS.   Yes Historical Provider, MD  losartan (COZAAR) 25 MG tablet TAKE 1 TABLET EVERY DAY 02/22/16  Yes Glean Hess, MD  metoprolol (LOPRESSOR) 100 MG tablet  Take 100 mg by mouth 2 (two) times daily.  08/05/15  Yes Historical Provider, MD  nitroGLYCERIN (NITROSTAT) 0.4 MG SL tablet Place 0.4 mg under the tongue every 5 (five) minutes as needed for chest pain.   Yes Historical Provider, MD  timolol (TIMOPTIC) 0.25 % ophthalmic solution INSTILL ONE DROP INTO BOTH EYES EVERY MORNING.   Yes Historical Provider, MD  cholecalciferol (VITAMIN D) 1000 UNITS tablet Take 1,000 Units by mouth daily.    Historical Provider, MD    Allergies  Allergen Reactions  . Ace Inhibitors     Other reaction(s): Angioedema    Past Surgical History  Procedure Laterality Date  . Coronary artery bypass graft  2006  . Mastectomy, radical Right 2002  . Total abdominal hysterectomy    . Carotid endarterectomy    . Mass excision Right 08/28/2015    Procedure: EXCISION MASS; remove massive right chest wall;  Surgeon: Florene Glen, MD;  Location: [facility] ORS;  Service: General;  Laterality: Right;    Social History  Substance Use Topics  . Smoking status: Former Smoker    Quit date: 10/04/1952  . Smokeless tobacco: Never Used  . Alcohol Use: No     Medication list has been reviewed and updated.   Physical Exam  Constitutional: She is oriented to person, place, and time. She appears well-developed. No distress.  HENT:  Head: Normocephalic and atraumatic.  Eyes: Conjunctivae and EOM are normal. Pupils are equal, round, and reactive to light.  Neck: Normal range of motion. Neck supple. Carotid bruit is not present. No thyromegaly present.  Cardiovascular: Normal rate, regular rhythm and normal heart sounds.   Pulmonary/Chest: Effort normal and breath sounds normal. No respiratory distress. She has no wheezes. She has no rhonchi.  Musculoskeletal: Normal range of motion.  Lymphadenopathy:    She has no cervical adenopathy.  Neurological: She is alert and oriented to person, place, and time. She has normal strength. Coordination and gait normal.  Skin: Skin is warm  and dry. No rash noted.  Psychiatric: She has a normal mood and affect. Her speech is normal and behavior is normal. Judgment and thought content normal. Cognition and memory are normal.  Nursing note and vitals reviewed.  Cognitive Testing - 6-CIT   Correct? Score   What year is it? yes 0 Yes = 0    No = 4  What month is it? yes 0 Yes = 0    No = 3  Remember:     [place] [place], [place], [place]     What time is it? yes 0 Yes = 0    No = 3  Count backwards from 20 to 1 yes 0 Correct = 0    1 error = 2   More than 1 error = 4  Say the months of the year in reverse. yes 0 Correct = 0    1 error = 2   More than 1 error = 4  What address did I ask you to remember? yes 0 Correct = 0  1 error = 2    2 error = 4    3 error = 6  4 error = 8    All wrong = 10       TOTAL SCORE  0/28   Interpretation:  Normal  Normal (0-7) Abnormal (8-28)    BP 128/86 mmHg  Pulse 56  Temp(Src) 98.3 F (36.8 C) (Oral)  Resp 16  Ht 5' (1.524 m)  Wt 154 lb (69.854 kg)  BMI 30.08 kg/m2  SpO2 97%  Assessment and Plan: 1. Essential (primary) hypertension Improved control - continue current therapy Will be monitored by [facility] for a few weeks to determine stability  2. Cognitive decline No evidence on today's exam May be subtle early signs but would not start Aricept Family will monitor closely  3. Abnormality of gait due to impairment of balance Work with PTx at home Use cane when outside If sense of imbalance worsens, consider further evaluation with Carotid US and CT Brain   Halina Maidens, MD [facility]  03/29/2016

## 2016-03-30 ENCOUNTER — Other Ambulatory Visit: Payer: Self-pay | Admitting: Internal Medicine

## 2016-03-30 DIAGNOSIS — Z7982 Long term (current) use of aspirin: Secondary | ICD-10-CM | POA: Diagnosis not present

## 2016-03-30 DIAGNOSIS — I10 Essential (primary) hypertension: Secondary | ICD-10-CM | POA: Diagnosis not present

## 2016-03-30 DIAGNOSIS — M545 Low back pain: Secondary | ICD-10-CM | POA: Diagnosis not present

## 2016-03-30 DIAGNOSIS — H409 Unspecified glaucoma: Secondary | ICD-10-CM | POA: Diagnosis not present

## 2016-03-30 DIAGNOSIS — I2510 Atherosclerotic heart disease of native coronary artery without angina pectoris: Secondary | ICD-10-CM | POA: Diagnosis not present

## 2016-03-30 DIAGNOSIS — Z853 Personal history of malignant neoplasm of breast: Secondary | ICD-10-CM | POA: Diagnosis not present

## 2016-03-31 ENCOUNTER — Telehealth: Payer: Self-pay

## 2016-03-31 NOTE — Telephone Encounter (Signed)
HH called with [facility] to report patient requested discharge from [facility] care. She also said she is not taking Arimidex. [facility]

## 2016-04-01 DIAGNOSIS — Z7982 Long term (current) use of aspirin: Secondary | ICD-10-CM | POA: Diagnosis not present

## 2016-04-01 DIAGNOSIS — I2510 Atherosclerotic heart disease of native coronary artery without angina pectoris: Secondary | ICD-10-CM | POA: Diagnosis not present

## 2016-04-01 DIAGNOSIS — H409 Unspecified glaucoma: Secondary | ICD-10-CM | POA: Diagnosis not present

## 2016-04-01 DIAGNOSIS — Z853 Personal history of malignant neoplasm of breast: Secondary | ICD-10-CM | POA: Diagnosis not present

## 2016-04-01 DIAGNOSIS — M545 Low back pain: Secondary | ICD-10-CM | POA: Diagnosis not present

## 2016-04-01 DIAGNOSIS — I10 Essential (primary) hypertension: Secondary | ICD-10-CM | POA: Diagnosis not present

## 2016-04-05 DIAGNOSIS — Z853 Personal history of malignant neoplasm of breast: Secondary | ICD-10-CM | POA: Diagnosis not present

## 2016-04-05 DIAGNOSIS — I2510 Atherosclerotic heart disease of native coronary artery without angina pectoris: Secondary | ICD-10-CM | POA: Diagnosis not present

## 2016-04-05 DIAGNOSIS — H409 Unspecified glaucoma: Secondary | ICD-10-CM | POA: Diagnosis not present

## 2016-04-05 DIAGNOSIS — M545 Low back pain: Secondary | ICD-10-CM | POA: Diagnosis not present

## 2016-04-05 DIAGNOSIS — I10 Essential (primary) hypertension: Secondary | ICD-10-CM | POA: Diagnosis not present

## 2016-04-05 DIAGNOSIS — Z7982 Long term (current) use of aspirin: Secondary | ICD-10-CM | POA: Diagnosis not present

## 2016-04-07 ENCOUNTER — Other Ambulatory Visit: Payer: Self-pay

## 2016-04-07 DIAGNOSIS — Z8679 Personal history of other diseases of the circulatory system: Secondary | ICD-10-CM | POA: Diagnosis not present

## 2016-04-07 DIAGNOSIS — I2510 Atherosclerotic heart disease of native coronary artery without angina pectoris: Secondary | ICD-10-CM | POA: Diagnosis not present

## 2016-04-07 DIAGNOSIS — M545 Low back pain: Secondary | ICD-10-CM | POA: Diagnosis not present

## 2016-04-07 DIAGNOSIS — R079 Chest pain, unspecified: Secondary | ICD-10-CM | POA: Diagnosis not present

## 2016-04-07 DIAGNOSIS — I25110 Atherosclerotic heart disease of native coronary artery with unstable angina pectoris: Secondary | ICD-10-CM | POA: Diagnosis not present

## 2016-04-07 DIAGNOSIS — H409 Unspecified glaucoma: Secondary | ICD-10-CM | POA: Diagnosis not present

## 2016-04-07 DIAGNOSIS — I200 Unstable angina: Secondary | ICD-10-CM | POA: Diagnosis not present

## 2016-04-07 DIAGNOSIS — Z79899 Other long term (current) drug therapy: Secondary | ICD-10-CM | POA: Diagnosis not present

## 2016-04-07 DIAGNOSIS — E7800 Pure hypercholesterolemia, unspecified: Secondary | ICD-10-CM | POA: Diagnosis not present

## 2016-04-07 DIAGNOSIS — I249 Acute ischemic heart disease, unspecified: Secondary | ICD-10-CM | POA: Diagnosis not present

## 2016-04-07 DIAGNOSIS — R0789 Other chest pain: Secondary | ICD-10-CM | POA: Diagnosis not present

## 2016-04-07 DIAGNOSIS — I10 Essential (primary) hypertension: Secondary | ICD-10-CM | POA: Diagnosis not present

## 2016-04-07 DIAGNOSIS — Z87891 Personal history of nicotine dependence: Secondary | ICD-10-CM | POA: Diagnosis not present

## 2016-04-07 DIAGNOSIS — Z7982 Long term (current) use of aspirin: Secondary | ICD-10-CM | POA: Diagnosis not present

## 2016-04-07 DIAGNOSIS — I160 Hypertensive urgency: Secondary | ICD-10-CM | POA: Diagnosis not present

## 2016-04-07 DIAGNOSIS — C50919 Malignant neoplasm of unspecified site of unspecified female breast: Secondary | ICD-10-CM | POA: Diagnosis not present

## 2016-04-07 DIAGNOSIS — Z853 Personal history of malignant neoplasm of breast: Secondary | ICD-10-CM | POA: Diagnosis not present

## 2016-04-08 DIAGNOSIS — I160 Hypertensive urgency: Secondary | ICD-10-CM | POA: Diagnosis not present

## 2016-04-08 DIAGNOSIS — I10 Essential (primary) hypertension: Secondary | ICD-10-CM | POA: Diagnosis not present

## 2016-04-08 DIAGNOSIS — I200 Unstable angina: Secondary | ICD-10-CM | POA: Diagnosis not present

## 2016-04-08 DIAGNOSIS — R079 Chest pain, unspecified: Secondary | ICD-10-CM | POA: Diagnosis not present

## 2016-04-12 ENCOUNTER — Encounter (INDEPENDENT_AMBULATORY_CARE_PROVIDER_SITE_OTHER): Payer: Medicare Other | Admitting: Internal Medicine

## 2016-04-12 DIAGNOSIS — I2510 Atherosclerotic heart disease of native coronary artery without angina pectoris: Secondary | ICD-10-CM | POA: Diagnosis not present

## 2016-04-12 DIAGNOSIS — I10 Essential (primary) hypertension: Secondary | ICD-10-CM

## 2016-04-12 DIAGNOSIS — F0390 Unspecified dementia without behavioral disturbance: Secondary | ICD-10-CM | POA: Insufficient documentation

## 2016-04-12 DIAGNOSIS — M545 Low back pain, unspecified: Secondary | ICD-10-CM | POA: Insufficient documentation

## 2016-04-12 NOTE — Progress Notes (Signed)
Patient ID: Donna Santos, female   DOB: Nov 28, 1925, 82 y.o.   MRN: [id number]  Home health orders received from [facility]. Certification period with start of care 03/25/16 to extend through 05/23/16.   Orders are reviewed, signed and mailed to the agency.

## 2016-04-14 DIAGNOSIS — I2510 Atherosclerotic heart disease of native coronary artery without angina pectoris: Secondary | ICD-10-CM | POA: Diagnosis not present

## 2016-04-14 DIAGNOSIS — Z853 Personal history of malignant neoplasm of breast: Secondary | ICD-10-CM | POA: Diagnosis not present

## 2016-04-14 DIAGNOSIS — I10 Essential (primary) hypertension: Secondary | ICD-10-CM | POA: Diagnosis not present

## 2016-04-14 DIAGNOSIS — Z7982 Long term (current) use of aspirin: Secondary | ICD-10-CM | POA: Diagnosis not present

## 2016-04-14 DIAGNOSIS — H409 Unspecified glaucoma: Secondary | ICD-10-CM | POA: Diagnosis not present

## 2016-04-14 DIAGNOSIS — M545 Low back pain: Secondary | ICD-10-CM | POA: Diagnosis not present

## 2016-04-16 ENCOUNTER — Ambulatory Visit (INDEPENDENT_AMBULATORY_CARE_PROVIDER_SITE_OTHER): Payer: Medicare Other | Admitting: Internal Medicine

## 2016-04-16 ENCOUNTER — Encounter: Payer: Self-pay | Admitting: Internal Medicine

## 2016-04-16 VITALS — BP 172/82 | HR 68 | Resp 16 | Ht 60.0 in | Wt 151.0 lb

## 2016-04-16 DIAGNOSIS — R4189 Other symptoms and signs involving cognitive functions and awareness: Secondary | ICD-10-CM

## 2016-04-16 DIAGNOSIS — I10 Essential (primary) hypertension: Secondary | ICD-10-CM

## 2016-04-16 DIAGNOSIS — I2510 Atherosclerotic heart disease of native coronary artery without angina pectoris: Secondary | ICD-10-CM

## 2016-04-16 NOTE — Progress Notes (Signed)
Date:  04/16/2016   Name:  Donna Santos   DOB:  May 12, 1925   MRN:  [id number]   Chief Complaint: Hospitalization Follow-up Patient was admitted to [facility] on 04/07/2016 for hypertensive urgency and chest pain. She was discharged the next day on 04/08/2016 with Imdur added to her medication regimen. Since being home she's done well. She's had a slight amount of lightheadedness that lasts only a few seconds but she cannot tell me when this occurs. She is only checked her blood pressure a few times at home and she does not know what the readings were. The nurses no longer visiting and she has only one more physical therapy visit.  Hypertension This is a chronic problem. The current episode started more than 1 year ago. The problem has been waxing and waning since onset. Pertinent negatives include no chest pain, headaches, palpitations or shortness of breath.  Now on cozaar, lopressor, Imdur, amlodipine and chlorthalidone.  Senile Dementia - aricept 5 mg added one month ago.  She seems to be tolerating it well.  Her son thinks perhaps it is contributing to lightheadness.  Review of Systems  Constitutional: Negative for diaphoresis, appetite change and fatigue.  Eyes: Negative for visual disturbance.  Respiratory: Positive for chest tightness. Negative for cough, shortness of breath and wheezing.   Cardiovascular: Positive for leg swelling. Negative for chest pain and palpitations.  Gastrointestinal: Negative for abdominal pain.  Neurological: Positive for light-headedness. Negative for dizziness, tremors, syncope, weakness and headaches.  Hematological: Negative for adenopathy.  Psychiatric/Behavioral: Negative for dysphoric mood. The patient is not nervous/anxious.     Patient Active Problem List   Diagnosis Date Noted  . Senile dementia, uncomplicated 0000000  . Low back pain 04/12/2016  . Cognitive decline 03/21/2016  . Chest wall recurrence of right breast cancer ([place])  09/09/2015  . Impaired renal function 05/09/2015  . Arteriosclerosis of coronary artery 05/09/2015  . Essential (primary) hypertension 05/09/2015  . Personal history of malignant neoplasm of breast 05/09/2015  . Muscle spasms of neck 05/09/2015  . Arthritis of shoulder region, degenerative 05/09/2015  . Current tobacco use 05/09/2015  . Hypercholesteremia 04/20/2012  . Carotid artery disease ([place]) 05/28/2005    Prior to Admission medications   Medication Sig Start Date End Date Taking? Authorizing Provider  amLODipine (NORVASC) 5 MG tablet TAKE 1 TABLET BY MOUTH TWICE DAILY 03/30/16  Yes Donna Hess, MD  anastrozole (ARIMIDEX) 1 MG tablet Take 1 tablet (1 mg total) by mouth daily. 09/09/15  Yes Donna Sickle, MD  aspirin EC 81 MG tablet Take 81 mg by mouth daily.   Yes Historical Provider, MD  Calcium Carb-Cholecalciferol (CALCIUM 600+D) 600-800 MG-UNIT TABS Take 1 tablet by mouth daily. Reported on 12/25/2015   Yes Historical Provider, MD  chlorthalidone (HYGROTON) 25 MG tablet TAKE 1 TABLET BY MOUTH DAILY 03/18/16  Yes Donna Hess, MD  cholecalciferol (VITAMIN D) 1000 UNITS tablet Take 1,000 Units by mouth daily.   Yes Historical Provider, MD  donepezil (ARICEPT) 5 MG tablet Take 1 tablet by mouth daily. 03/21/16  Yes Historical Provider, MD  isosorbide mononitrate (IMDUR) 30 MG 24 hr tablet Take 30 mg by mouth. 04/09/16 04/09/17 Yes Historical Provider, MD  latanoprost (XALATAN) 0.005 % ophthalmic solution INT 1 GTT INTO EACH EYE QHS.   Yes Historical Provider, MD  losartan (COZAAR) 25 MG tablet TAKE 1 TABLET EVERY DAY 02/22/16  Yes Donna Hess, MD  metoprolol (LOPRESSOR) 100 MG tablet Take  100 mg by mouth 2 (two) times daily.  08/05/15  Yes Historical Provider, MD  nitroGLYCERIN (NITROSTAT) 0.4 MG SL tablet Place 0.4 mg under the tongue every 5 (five) minutes as needed for chest pain.   Yes Historical Provider, MD  timolol (TIMOPTIC) 0.25 % ophthalmic solution INSTILL ONE  DROP INTO BOTH EYES EVERY MORNING.   Yes Historical Provider, MD    Allergies  Allergen Reactions  . Ace Inhibitors     Other reaction(s): Angioedema    Past Surgical History  Procedure Laterality Date  . Coronary artery bypass graft  2006  . Mastectomy, radical Right 2002  . Total abdominal hysterectomy    . Carotid endarterectomy    . Mass excision Right 08/28/2015    Procedure: EXCISION MASS; remove massive right chest wall;  Surgeon: Donna Glen, MD;  Location: [facility] ORS;  Service: General;  Laterality: Right;    Social History  Substance Use Topics  . Smoking status: Former Smoker    Quit date: 10/04/1952  . Smokeless tobacco: Never Used  . Alcohol Use: No    Medication list has been reviewed and updated.   Physical Exam  Constitutional: She appears well-developed and well-nourished. No distress.  Neck: Normal range of motion. Neck supple. No thyromegaly present.  Cardiovascular: Normal rate, regular rhythm and normal heart sounds.   Pulmonary/Chest: Effort normal and breath sounds normal. She has no wheezes. She has no rales.  Musculoskeletal: She exhibits edema. She exhibits no tenderness.  Neurological: She is alert.  Skin: Skin is warm and dry.  Psychiatric: She has a normal mood and affect.  Nursing note and vitals reviewed.   BP 195/84 mmHg  Pulse 68  Resp 16  Ht 5' (1.524 m)  Wt 151 lb (68.493 kg)  BMI 29.49 kg/m2  SpO2 97%  Assessment and Plan: 1. Essential (primary) hypertension Only fair control on 5 agents - will not change at this time since it is isolated systolic hypertension Re-enforced sodium restriction  2. Arteriosclerosis of coronary artery Followed by Cardiology - appt next month  3. Cognitive decline Continue Aricept - take at bedtime   Donna Maidens, MD [facility]  04/16/2016

## 2016-04-16 NOTE — Patient Instructions (Signed)
DASH Eating Plan  DASH stands for "Dietary Approaches to Stop Hypertension." The DASH eating plan is a healthy eating plan that has been shown to reduce high blood pressure (hypertension). Additional health benefits may include reducing the risk of type 2 diabetes mellitus, heart disease, and stroke. The DASH eating plan may also help with weight loss.  WHAT DO I NEED TO KNOW ABOUT THE DASH EATING PLAN?  For the DASH eating plan, you will follow these general guidelines:  · Choose foods with a percent daily value for sodium of less than 5% (as listed on the food label).  · Use salt-free seasonings or herbs instead of table salt or sea salt.  · Check with your health care provider or pharmacist before using salt substitutes.  · Eat lower-sodium products, often labeled as "lower sodium" or "no salt added."  · Eat fresh foods.  · Eat more vegetables, fruits, and low-fat dairy products.  · Choose whole grains. Look for the word "whole" as the first word in the ingredient list.  · Choose fish and skinless chicken or turkey more often than red meat. Limit fish, poultry, and meat to 6 oz (170 g) each day.  · Limit sweets, desserts, sugars, and sugary drinks.  · Choose heart-healthy fats.  · Limit cheese to 1 oz (28 g) per day.  · Eat more home-cooked food and less restaurant, buffet, and fast food.  · Limit fried foods.  · Cook foods using methods other than frying.  · Limit canned vegetables. If you do use them, rinse them well to decrease the sodium.  · When eating at a restaurant, ask that your food be prepared with less salt, or no salt if possible.  WHAT FOODS CAN I EAT?  Seek help from a dietitian for individual calorie needs.  Grains  Whole grain or whole wheat bread. Brown rice. Whole grain or whole wheat pasta. Quinoa, bulgur, and whole grain cereals. Low-sodium cereals. Corn or whole wheat flour tortillas. Whole grain cornbread. Whole grain crackers. Low-sodium crackers.  Vegetables  Fresh or frozen vegetables  (raw, steamed, roasted, or grilled). Low-sodium or reduced-sodium tomato and vegetable juices. Low-sodium or reduced-sodium tomato sauce and paste. Low-sodium or reduced-sodium canned vegetables.   Fruits  All fresh, canned (in natural juice), or frozen fruits.  Meat and Other Protein Products  Ground beef (85% or leaner), grass-fed beef, or beef trimmed of fat. Skinless chicken or turkey. Ground chicken or turkey. Pork trimmed of fat. All fish and seafood. Eggs. Dried beans, peas, or lentils. Unsalted nuts and seeds. Unsalted canned beans.  Dairy  Low-fat dairy products, such as skim or 1% milk, 2% or reduced-fat cheeses, low-fat ricotta or cottage cheese, or plain low-fat yogurt. Low-sodium or reduced-sodium cheeses.  Fats and Oils  Tub margarines without trans fats. Light or reduced-fat mayonnaise and salad dressings (reduced sodium). Avocado. Safflower, olive, or canola oils. Natural peanut or almond butter.  Other  Unsalted popcorn and pretzels.  The items listed above may not be a complete list of recommended foods or beverages. Contact your dietitian for more options.  WHAT FOODS ARE NOT RECOMMENDED?  Grains  White bread. White pasta. White rice. Refined cornbread. Bagels and croissants. Crackers that contain trans fat.  Vegetables  Creamed or fried vegetables. Vegetables in a cheese sauce. Regular canned vegetables. Regular canned tomato sauce and paste. Regular tomato and vegetable juices.  Fruits  Dried fruits. Canned fruit in light or heavy syrup. Fruit juice.  Meat and Other Protein   Products  Fatty cuts of meat. Ribs, chicken wings, bacon, sausage, bologna, salami, chitterlings, fatback, hot dogs, bratwurst, and packaged luncheon meats. Salted nuts and seeds. Canned beans with salt.  Dairy  Whole or 2% milk, cream, half-and-half, and cream cheese. Whole-fat or sweetened yogurt. Full-fat cheeses or blue cheese. Nondairy creamers and whipped toppings. Processed cheese, cheese spreads, or cheese  curds.  Condiments  Onion and garlic salt, seasoned salt, table salt, and sea salt. Canned and packaged gravies. Worcestershire sauce. Tartar sauce. Barbecue sauce. Teriyaki sauce. Soy sauce, including reduced sodium. Steak sauce. Fish sauce. Oyster sauce. Cocktail sauce. Horseradish. Ketchup and mustard. Meat flavorings and tenderizers. Bouillon cubes. Hot sauce. Tabasco sauce. Marinades. Taco seasonings. Relishes.  Fats and Oils  Butter, stick margarine, lard, shortening, ghee, and bacon fat. Coconut, palm kernel, or palm oils. Regular salad dressings.  Other  Pickles and olives. Salted popcorn and pretzels.  The items listed above may not be a complete list of foods and beverages to avoid. Contact your dietitian for more information.  WHERE CAN I FIND MORE INFORMATION?  National Heart, [employer], [employer] Institute: www.nhlbi.nih.gov/health/health-topics/topics/dash/     This information is not intended to replace advice given to you by your health care provider. Make sure you discuss any questions you have with your health care provider.     Document Released: 11/04/2011 Document Revised: 12/06/2014 Document Reviewed: 09/19/2013  Elsevier Interactive Patient Education ©2016 [employer]vier Inc.

## 2016-04-22 DIAGNOSIS — M545 Low back pain: Secondary | ICD-10-CM | POA: Diagnosis not present

## 2016-04-22 DIAGNOSIS — I10 Essential (primary) hypertension: Secondary | ICD-10-CM | POA: Diagnosis not present

## 2016-04-22 DIAGNOSIS — I2510 Atherosclerotic heart disease of native coronary artery without angina pectoris: Secondary | ICD-10-CM | POA: Diagnosis not present

## 2016-04-22 DIAGNOSIS — Z853 Personal history of malignant neoplasm of breast: Secondary | ICD-10-CM | POA: Diagnosis not present

## 2016-04-22 DIAGNOSIS — Z7982 Long term (current) use of aspirin: Secondary | ICD-10-CM | POA: Diagnosis not present

## 2016-04-22 DIAGNOSIS — H409 Unspecified glaucoma: Secondary | ICD-10-CM | POA: Diagnosis not present

## 2016-04-23 ENCOUNTER — Encounter: Payer: Self-pay | Admitting: Radiation Oncology

## 2016-04-23 ENCOUNTER — Inpatient Hospital Stay: Payer: Medicare Other | Attending: Internal Medicine | Admitting: Internal Medicine

## 2016-04-23 ENCOUNTER — Ambulatory Visit
Admission: RE | Admit: 2016-04-23 | Discharge: 2016-04-23 | Disposition: A | Discharge status: Home / Self Care | Payer: Medicare Other | Source: Ambulatory Visit | Attending: Radiation Oncology | Admitting: Radiation Oncology

## 2016-04-23 VITALS — BP 151/73 | HR 62 | Temp 96.9°F | Resp 18 | Wt 153.4 lb

## 2016-04-23 DIAGNOSIS — Z79899 Other long term (current) drug therapy: Secondary | ICD-10-CM

## 2016-04-23 DIAGNOSIS — Z9011 Acquired absence of right breast and nipple: Secondary | ICD-10-CM | POA: Diagnosis not present

## 2016-04-23 DIAGNOSIS — Z923 Personal history of irradiation: Secondary | ICD-10-CM | POA: Diagnosis not present

## 2016-04-23 DIAGNOSIS — C50911 Malignant neoplasm of unspecified site of right female breast: Secondary | ICD-10-CM

## 2016-04-23 DIAGNOSIS — M810 Age-related osteoporosis without current pathological fracture: Secondary | ICD-10-CM

## 2016-04-23 DIAGNOSIS — R252 Cramp and spasm: Secondary | ICD-10-CM

## 2016-04-23 DIAGNOSIS — K869 Disease of pancreas, unspecified: Secondary | ICD-10-CM | POA: Diagnosis not present

## 2016-04-23 DIAGNOSIS — N281 Cyst of kidney, acquired: Secondary | ICD-10-CM | POA: Diagnosis not present

## 2016-04-23 DIAGNOSIS — E7800 Pure hypercholesterolemia, unspecified: Secondary | ICD-10-CM

## 2016-04-23 DIAGNOSIS — R918 Other nonspecific abnormal finding of lung field: Secondary | ICD-10-CM | POA: Insufficient documentation

## 2016-04-23 DIAGNOSIS — M129 Arthropathy, unspecified: Secondary | ICD-10-CM | POA: Insufficient documentation

## 2016-04-23 DIAGNOSIS — I2510 Atherosclerotic heart disease of native coronary artery without angina pectoris: Secondary | ICD-10-CM | POA: Diagnosis not present

## 2016-04-23 DIAGNOSIS — M818 Other osteoporosis without current pathological fracture: Secondary | ICD-10-CM | POA: Diagnosis not present

## 2016-04-23 DIAGNOSIS — Z87891 Personal history of nicotine dependence: Secondary | ICD-10-CM | POA: Insufficient documentation

## 2016-04-23 DIAGNOSIS — I10 Essential (primary) hypertension: Secondary | ICD-10-CM | POA: Diagnosis not present

## 2016-04-23 DIAGNOSIS — Z170 Estrogen receptor positive status [ER+]: Secondary | ICD-10-CM | POA: Diagnosis not present

## 2016-04-23 DIAGNOSIS — Z79811 Long term (current) use of aromatase inhibitors: Secondary | ICD-10-CM

## 2016-04-23 DIAGNOSIS — Z7982 Long term (current) use of aspirin: Secondary | ICD-10-CM | POA: Diagnosis not present

## 2016-04-23 NOTE — Progress Notes (Signed)
RN Chaperoned provider with Breast Exam.   

## 2016-04-23 NOTE — Progress Notes (Signed)
[facility] OFFICE PROGRESS NOTE  Patient Care Team: Glean Hess, MD as PCP - General (Family Medicine)   SUMMARY OF ONCOLOGIC HISTORY:  # 2002- Right Breast" Predominant DCIS- cribriform/low grade; 1.5cm with 5% early invasion"; ER 74%; PR-NEG; her 2 Neg; S/P Mastec & ALND [no residual ca/dcis] & Recon]; NO TAM  # 2016- CHEST WALL RECURRENCE [~2.5CM] s/p RIGHT excisional Biopsy; positive deep margin [ Dr.Cooper] Cribriform type; ER/PR >90%; HER 2 neu-NEG; s/p RT; Arimidex  # Lung nodule [36m LL; Oct 2016]; Left kidney ? 2.8cm Complex Cyst- [place]-NEG for mass; Pancreas 10 mm cyst  # OSTEOPOROSIS [BMD, OCT 2016] ca+ vit D   INTERVAL HISTORY:  Donna Santos 82y.o. female patient with a recent diagnosis of isolated chest wall recurrence/not resectable ER/PR positive HER-2/neu negative  Currently status post radiation finished in of December 2016; on Arimidexis here for follow-up.  Patient denies any hot flashes. Denies any new joint pains or muscle pain. Denies any lumps or bumps. Patient denies any symptoms at this time.  REVIEW OF SYSTEMS:  A complete 10 point review of system is done which is negative except mentioned above/history of present illness.   PAST MEDICAL HISTORY :  Past Medical History  Diagnosis Date  . Impaired renal function   . Arteriosclerosis of coronary artery   . Essential hypertension   . Malignant ne[place] of breast ([place]   . Muscle spasms of neck   . Arthritis of shoulder region   . Tobacco abuse   . Hypercholesteremia   . Breast cancer ([place]   . Cataract     PAST SURGICAL HISTORY :   Past Surgical History  Procedure Laterality Date  . Coronary artery bypass graft  2006  . Mastectomy, radical Right 2002  . Total abdominal hysterectomy    . Carotid endarterectomy    . Mass excision Right 08/28/2015    Procedure: EXCISION MASS; remove massive right chest wall;  Surgeon: RFlorene Glen MD;  Location: [facility] ORS;  Service:  General;  Laterality: Right;    FAMILY HISTORY :   Family History  Problem Relation Age of Onset  . Hypertension Father   . Cancer Father     SOCIAL HISTORY:   Social History  Substance Use Topics  . Smoking status: Former Smoker    Quit date: 10/04/1952  . Smokeless tobacco: Never Used  . Alcohol Use: No    ALLERGIES:  is allergic to ace inhibitors.  MEDICATIONS:  Current Outpatient Prescriptions  Medication Sig Dispense Refill  . amLODipine (NORVASC) 5 MG tablet TAKE 1 TABLET BY MOUTH TWICE DAILY 60 tablet 5  . anastrozole (ARIMIDEX) 1 MG tablet Take 1 tablet (1 mg total) by mouth daily. 90 tablet 3  . aspirin EC 81 MG tablet Take 81 mg by mouth daily.    . Calcium Carb-Cholecalciferol (CALCIUM 600+D) 600-800 MG-UNIT TABS Take 1 tablet by mouth daily. Reported on 12/25/2015    . chlorthalidone (HYGROTON) 25 MG tablet TAKE 1 TABLET BY MOUTH DAILY 30 tablet 12  . cholecalciferol (VITAMIN D) 1000 UNITS tablet Take 1,000 Units by mouth daily.    . isosorbide mononitrate (IMDUR) 30 MG 24 hr tablet Take 30 mg by mouth.    . latanoprost (XALATAN) 0.005 % ophthalmic solution INT 1 GTT INTO EACH EYE QHS.  6  . losartan (COZAAR) 25 MG tablet TAKE 1 TABLET EVERY DAY 30 tablet 5  . metoprolol (LOPRESSOR) 100 MG tablet Take 100 mg by  mouth 2 (two) times daily.     . nitroGLYCERIN (NITROSTAT) 0.4 MG SL tablet Place 0.4 mg under the tongue every 5 (five) minutes as needed for chest pain.    Marland Kitchen timolol (TIMOPTIC) 0.25 % ophthalmic solution INSTILL ONE DROP INTO BOTH EYES EVERY MORNING.  6   No current facility-administered medications for this visit.    PHYSICAL EXAMINATION: ECOG PERFORMANCE STATUS: 0 - Asymptomatic  BP 151/73 mmHg  Pulse 62  Temp(Src) 96.9 F (36.1 C) (Tympanic)  Resp 18  Wt 153 lb 7 oz (69.6 kg)  Filed Weights   04/23/16 1017  Weight: 153 lb 7 oz (69.6 kg)    GENERAL: Well-nourished well-developed; Alert, no distress and comfortable.   Accompanied by her  daughterShe is walking herself. EYES: no pallor or icterus OROPHARYNX: no thrush or ulceration; good dentition  NECK: supple, no masses felt LYMPH:  no palpable lymphadenopathy in the cervical, axillary or inguinal regions LUNGS: clear to auscultation and  No wheeze or crackles HEART/CVS: regular rate & rhythm and no murmurs; No lower extremity edema ABDOMEN:abdomen soft, non-tender and normal bowel sounds Musculoskeletal:no cyanosis of digits and no clubbing  PSYCH: alert & oriented x 3 with fluent speech NEURO: no focal motor/sensory deficits SKIN:  no rashes or significant lesions; chest wall- above the right mastectomy incision; no masses felt.   LABORATORY DATA:  I have reviewed the data as listed    Component Value Date/Time   NA 137 08/28/2015 0827   NA 138 07/31/2015 1024   K 3.6 08/28/2015 0827   CL 95* 08/27/2015 1304   CO2 28 08/27/2015 1304   GLUCOSE 131* 08/28/2015 0827   GLUCOSE 124* 07/31/2015 1024   BUN 18 08/27/2015 1304   BUN 18 07/31/2015 1024   CREATININE 1.01* 08/27/2015 1304   CALCIUM 9.5 08/27/2015 1304   PROT 7.4 07/31/2015 1024   ALBUMIN 4.5 07/31/2015 1024   AST 15 07/31/2015 1024   ALT 12 07/31/2015 1024   ALKPHOS 44 07/31/2015 1024   BILITOT 0.4 07/31/2015 1024   GFRNONAA 48* 08/27/2015 1304   GFRAA 56* 08/27/2015 1304    No results found for: SPEP, UPEP  Lab Results  Component Value Date   WBC 8.0 11/05/2015   NEUTROABS 8.0* 08/27/2015   HGB 13.6 11/05/2015   HCT 40.2 11/05/2015   MCV 90.7 11/05/2015   PLT 367 11/05/2015      Chemistry      Component Value Date/Time   NA 137 08/28/2015 0827   NA 138 07/31/2015 1024   K 3.6 08/28/2015 0827   CL 95* 08/27/2015 1304   CO2 28 08/27/2015 1304   BUN 18 08/27/2015 1304   BUN 18 07/31/2015 1024   CREATININE 1.01* 08/27/2015 1304      Component Value Date/Time   CALCIUM 9.5 08/27/2015 1304   ALKPHOS 44 07/31/2015 1024   AST 15 07/31/2015 1024   ALT 12 07/31/2015 1024   BILITOT 0.4  07/31/2015 1024        ASSESSMENT & PLAN:   # RIGHT IPSILATERAL CHEST WALL RECURRENCE STATUS post mastectomy 2002- Cribiform type; ER/PR >90%; Her2 Neu-NEG. Status post radiation. Complete response noted. No masses felt on the chest wall. Currently on Arimidex. She is tolerating well without any major side effects.  # Patient has osteoporosis. Continue calcium and vitamin D twice a day. We'll plan repeat bone density test in oct 2017. This has been ordered.  # patient will follow-up with me in approximately 6 months/labs/ BMD  Cammie Sickle, MD 04/23/2016 10:32 AM

## 2016-04-23 NOTE — Progress Notes (Signed)
Radiation Oncology Follow up Note  Name: Donna Santos   Date:   04/23/2016 MRN:  [id number] DOB: 1925-09-26    This 82 y.o. female presents to the clinic today for six-month follow-up for chest wall recurrence status post mastectomy with radiation therapy to her right chest wall.  REFERRING PROVIDER: Glean Hess, MD  HPI: Patient is a 82 year old female now out 5 months having completed right chest wall peripheral lymphatic radiation therapy for a nodular recurrence status post modified radical mastectomy in 2002. Tumor was ER positive PR borderline HER-2/neu negative. She is currently on anastrozole following that well without side effect. She specifically denies chest wall tenderness any new nodularity or masses cough or bone pain..  COMPLICATIONS OF TREATMENT: none  FOLLOW UP COMPLIANCE: keeps appointments   PHYSICAL EXAM:  There were no vitals taken for this visit. Patient is status post right on file radical mastectomy chest walls clear without evidence of nodularity or mass. Left breast is free of dominant mass or nodularity in 2 positions examined. No axillary or supraclavicular adenopathy is appreciated. Well-developed well-nourished patient in NAD. HEENT reveals PERLA, EOMI, discs not visualized.  Oral cavity is clear. No oral mucosal lesions are identified. Neck is clear without evidence of cervical or supraclavicular adenopathy. Lungs are clear to A&P. Cardiac examination is essentially unremarkable with regular rate and rhythm without murmur rub or thrill. Abdomen is benign with no organomegaly or masses noted. Motor sensory and DTR levels are equal and symmetric in the upper and lower extremities. Cranial nerves II through XII are grossly intact. Proprioception is intact. No peripheral adenopathy or edema is identified. No motor or sensory levels are noted. Crude visual fields are within normal range.  RADIOLOGY RESULTS: No current films for review  PLAN: Present time  she is doing well with no evidence of disease now close to 6 months out. I'll see her back in 6 months for follow-up and then switch to once your follow-up visits. She continues on anastrozole without side effect. Patient is to call with any concerns. She is doing well.  I would like to take this opportunity to thank you for allowing me to participate in the care of your patient.Armstead Peaks., MD

## 2016-05-03 ENCOUNTER — Encounter: Payer: Self-pay | Admitting: Internal Medicine

## 2016-05-03 ENCOUNTER — Ambulatory Visit (INDEPENDENT_AMBULATORY_CARE_PROVIDER_SITE_OTHER): Payer: Medicare Other | Admitting: Internal Medicine

## 2016-05-03 VITALS — BP 138/80 | HR 58 | Resp 16 | Ht 60.0 in | Wt 155.0 lb

## 2016-05-03 DIAGNOSIS — I10 Essential (primary) hypertension: Secondary | ICD-10-CM

## 2016-05-03 DIAGNOSIS — I2510 Atherosclerotic heart disease of native coronary artery without angina pectoris: Secondary | ICD-10-CM | POA: Diagnosis not present

## 2016-05-03 DIAGNOSIS — R4189 Other symptoms and signs involving cognitive functions and awareness: Secondary | ICD-10-CM

## 2016-05-03 MED ORDER — ISOSORBIDE MONONITRATE ER 30 MG PO TB24: 30.0000 mg | ORAL_TABLET | Freq: Every day | ORAL | Status: DC

## 2016-05-03 MED ORDER — METOPROLOL TARTRATE 100 MG PO TABS
100.0000 mg | ORAL_TABLET | Freq: Two times a day (BID) | ORAL | Status: DC
Start: 2016-05-03 — End: 2017-05-22

## 2016-05-03 NOTE — Progress Notes (Signed)
Date:  05/03/2016   Name:  Donna Santos   DOB:  09-08-1925   MRN:  [id number]   Chief Complaint: Hypertension Hypertension This is a chronic problem. The problem has been waxing and waning since onset. The problem is resistant. Pertinent negatives include no chest pain, headaches, palpitations or shortness of breath. Past treatments include calcium channel blockers, diuretics, direct vasodilators and angiotensin blockers. The current treatment provides moderate improvement.   She has completed her Home health and physical therapy.  She is getting around with her cane.  No recent falls.  No lightheadedness.  Her appetite is good.  Her memory is impaired but she is managing with close family assistance. She has stopped Aricept - unclear reason but son had felt it was causing lightheadedness.  He is not here to day to confirm.  Review of Systems  Constitutional: Negative for fever, chills and fatigue.  Eyes: Negative for visual disturbance.  Respiratory: Negative for cough, chest tightness and shortness of breath.   Cardiovascular: Negative for chest pain, palpitations and leg swelling.  Gastrointestinal: Negative for abdominal pain, diarrhea and constipation.  Genitourinary: Negative for dysuria.  Musculoskeletal: Positive for gait problem. Negative for joint swelling.  Neurological: Negative for tremors, weakness, light-headedness, numbness and headaches.  Hematological: Negative for adenopathy.    Patient Active Problem List   Diagnosis Date Noted  . Senile dementia, uncomplicated 0000000  . Low back pain 04/12/2016  . Cognitive decline 03/21/2016  . Chest wall recurrence of right breast cancer ([place]) 09/09/2015  . Impaired renal function 05/09/2015  . Arteriosclerosis of coronary artery 05/09/2015  . Essential (primary) hypertension 05/09/2015  . Personal history of malignant neoplasm of breast 05/09/2015  . Muscle spasms of neck 05/09/2015  . Arthritis of shoulder  region, degenerative 05/09/2015  . Current tobacco use 05/09/2015  . Hypercholesteremia 04/20/2012  . Carotid artery disease ([place]) 05/28/2005    Prior to Admission medications   Medication Sig Start Date End Date Taking? Authorizing Provider  amLODipine (NORVASC) 5 MG tablet TAKE 1 TABLET BY MOUTH TWICE DAILY 03/30/16  Yes Glean Hess, MD  anastrozole (ARIMIDEX) 1 MG tablet Take 1 tablet (1 mg total) by mouth daily. 09/09/15  Yes Cammie Sickle, MD  aspirin EC 81 MG tablet Take 81 mg by mouth daily.   Yes Historical Provider, MD  Calcium Carb-Cholecalciferol (CALCIUM 600+D) 600-800 MG-UNIT TABS Take 1 tablet by mouth daily. Reported on 12/25/2015   Yes Historical Provider, MD  chlorthalidone (HYGROTON) 25 MG tablet TAKE 1 TABLET BY MOUTH DAILY 03/18/16  Yes Glean Hess, MD  cholecalciferol (VITAMIN D) 1000 UNITS tablet Take 1,000 Units by mouth daily.   Yes Historical Provider, MD  isosorbide mononitrate (IMDUR) 30 MG 24 hr tablet Take 30 mg by mouth. 04/09/16 04/09/17 Yes Historical Provider, MD  latanoprost (XALATAN) 0.005 % ophthalmic solution INT 1 GTT INTO EACH EYE QHS.   Yes Historical Provider, MD  losartan (COZAAR) 25 MG tablet TAKE 1 TABLET EVERY DAY 02/22/16  Yes Glean Hess, MD  metoprolol (LOPRESSOR) 100 MG tablet Take 100 mg by mouth 2 (two) times daily.  08/05/15  Yes Historical Provider, MD  nitroGLYCERIN (NITROSTAT) 0.4 MG SL tablet Place 0.4 mg under the tongue every 5 (five) minutes as needed for chest pain.   Yes Historical Provider, MD  timolol (TIMOPTIC) 0.25 % ophthalmic solution INSTILL ONE DROP INTO BOTH EYES EVERY MORNING.   Yes Historical Provider, MD    Allergies  Allergen Reactions  .  Ace Inhibitors     Other reaction(s): Angioedema    Past Surgical History  Procedure Laterality Date  . Coronary artery bypass graft  2006  . Mastectomy, radical Right 2002  . Total abdominal hysterectomy    . Carotid endarterectomy    . Mass excision Right  08/28/2015    Procedure: EXCISION MASS; remove massive right chest wall;  Surgeon: Florene Glen, MD;  Location: [facility] ORS;  Service: General;  Laterality: Right;    Social History  Substance Use Topics  . Smoking status: Former Smoker    Quit date: 10/04/1952  . Smokeless tobacco: Never Used  . Alcohol Use: No    Medication list has been reviewed and updated.   Physical Exam  Constitutional: She is oriented to person, place, and time. She appears well-developed. No distress.  HENT:  Head: Normocephalic and atraumatic.  Neck: Normal range of motion. Neck supple.  Cardiovascular: Normal rate, regular rhythm and normal heart sounds.   Pulmonary/Chest: Effort normal and breath sounds normal. No respiratory distress. She has no wheezes.  Musculoskeletal: She exhibits edema (trace ankle edema).  Neurological: She is alert and oriented to person, place, and time.  Skin: Skin is warm and dry. No rash noted.  Psychiatric: She has a normal mood and affect. Her behavior is normal. Thought content normal.  Nursing note and vitals reviewed.   BP 138/80 mmHg  Pulse 58  Resp 16  Ht 5' (1.524 m)  Wt 155 lb (70.308 kg)  BMI 30.27 kg/m2  SpO2 97%  Assessment and Plan: 1. Essential (primary) hypertension Improved control Continue multi-drug regimen - metoprolol (LOPRESSOR) 100 MG tablet; Take 1 tablet (100 mg total) by mouth 2 (two) times daily.  Dispense: 60 tablet; Refill: 5  2. Arteriosclerosis of coronary artery Stable without symptoms Follow up with Cardiology in September as planned - isosorbide mononitrate (IMDUR) 30 MG 24 hr tablet; Take 1 tablet (30 mg total) by mouth daily.  Dispense: 30 tablet; Refill: 5  3. Cognitive decline Stable with supportive family in attendance No longer taking Aricept   Halina Maidens, MD [facility]  05/03/2016

## 2016-06-10 DIAGNOSIS — I259 Chronic ischemic heart disease, unspecified: Secondary | ICD-10-CM | POA: Diagnosis not present

## 2016-06-10 DIAGNOSIS — I2510 Atherosclerotic heart disease of native coronary artery without angina pectoris: Secondary | ICD-10-CM | POA: Diagnosis not present

## 2016-06-10 DIAGNOSIS — I25118 Atherosclerotic heart disease of native coronary artery with other forms of angina pectoris: Secondary | ICD-10-CM | POA: Diagnosis not present

## 2016-06-10 DIAGNOSIS — E7800 Pure hypercholesterolemia, unspecified: Secondary | ICD-10-CM | POA: Diagnosis not present

## 2016-06-10 DIAGNOSIS — I25110 Atherosclerotic heart disease of native coronary artery with unstable angina pectoris: Secondary | ICD-10-CM | POA: Diagnosis not present

## 2016-06-10 DIAGNOSIS — I10 Essential (primary) hypertension: Secondary | ICD-10-CM | POA: Diagnosis not present

## 2016-08-03 ENCOUNTER — Encounter: Payer: Self-pay | Admitting: Internal Medicine

## 2016-08-03 ENCOUNTER — Ambulatory Visit (INDEPENDENT_AMBULATORY_CARE_PROVIDER_SITE_OTHER): Payer: Medicare Other | Admitting: Internal Medicine

## 2016-08-03 VITALS — BP 150/78 | HR 60 | Resp 16 | Ht 60.0 in | Wt 151.6 lb

## 2016-08-03 DIAGNOSIS — E7800 Pure hypercholesterolemia, unspecified: Secondary | ICD-10-CM

## 2016-08-03 DIAGNOSIS — M129 Arthropathy, unspecified: Secondary | ICD-10-CM | POA: Diagnosis not present

## 2016-08-03 DIAGNOSIS — I10 Essential (primary) hypertension: Secondary | ICD-10-CM | POA: Diagnosis not present

## 2016-08-03 DIAGNOSIS — I779 Disorder of arteries and arterioles, unspecified: Secondary | ICD-10-CM | POA: Diagnosis not present

## 2016-08-03 DIAGNOSIS — C761 Malignant neoplasm of thorax: Secondary | ICD-10-CM

## 2016-08-03 DIAGNOSIS — N289 Disorder of kidney and ureter, unspecified: Secondary | ICD-10-CM | POA: Diagnosis not present

## 2016-08-03 DIAGNOSIS — I2510 Atherosclerotic heart disease of native coronary artery without angina pectoris: Secondary | ICD-10-CM | POA: Diagnosis not present

## 2016-08-03 DIAGNOSIS — C50911 Malignant neoplasm of unspecified site of right female breast: Secondary | ICD-10-CM

## 2016-08-03 DIAGNOSIS — C7989 Secondary malignant neoplasm of other specified sites: Secondary | ICD-10-CM

## 2016-08-03 DIAGNOSIS — M1711 Unilateral primary osteoarthritis, right knee: Secondary | ICD-10-CM | POA: Insufficient documentation

## 2016-08-03 DIAGNOSIS — Z0000 Encounter for general adult medical examination without abnormal findings: Secondary | ICD-10-CM

## 2016-08-03 DIAGNOSIS — I739 Peripheral vascular disease, unspecified: Secondary | ICD-10-CM

## 2016-08-03 LAB — POCT URINALYSIS DIPSTICK
Bilirubin, UA: NEGATIVE
Glucose, UA: NEGATIVE
Ketones, UA: NEGATIVE
Leukocytes, UA: NEGATIVE
NITRITE UA: NEGATIVE
PH UA: 6.5
Protein, UA: NEGATIVE
RBC UA: NEGATIVE
Spec Grav, UA: 1.01

## 2016-08-03 NOTE — Patient Instructions (Addendum)
Pneumococcal Conjugate Vaccine (PCV13)   1. Why get vaccinated?  Vaccination can protect both children and adults from pneumococcal disease.  Pneumococcal disease is caused by bacteria that can spread from person to person through close contact. It can cause ear infections, and it can also lead to more serious infections of the:  · Lungs (pneumonia),  · Blood (bacteremia), and  · Covering of the brain and spinal cord (meningitis).  Pneumococcal pneumonia is most common among adults. Pneumococcal meningitis can cause deafness and brain damage, and it kills about 1 child in 10 who get it.  Anyone can get pneumococcal disease, but children under 2 years of age and adults 65 years and older, people with certain medical conditions, and cigarette smokers are at the highest risk.  Before there was a vaccine, the [place] saw:  · more than 700 cases of meningitis,  · about 13,000 blood infections,  · about 5 million ear infections, and  · about 200 deaths  in children under 5 each year from pneumococcal disease. Since vaccine became available, severe pneumococcal disease in these children has fallen by 88%.  About 18,000 older adults die of pneumococcal disease each year in the [place].  Treatment of pneumococcal infections with penicillin and other drugs is not as effective as it used to be, because some strains of the disease have become resistant to these drugs. This makes prevention of the disease, through vaccination, even more important.  2. PCV13 vaccine  Pneumococcal conjugate vaccine (called PCV13) protects against 13 types of pneumococcal bacteria.  PCV13 is routinely given to children at 2, 4, 6, and 12-15 months of age. It is also recommended for children and adults 2 to 64 years of age with certain health conditions, and for all adults 65 years of age and older. Your doctor can give you details.  3. Some people should not get this vaccine  Anyone who has ever had a life-threatening allergic reaction  to a dose of this vaccine, to an earlier pneumococcal vaccine called PCV7, or to any vaccine containing diphtheria toxoid (for example, DTaP), should not get PCV13.  Anyone with a severe allergy to any component of PCV13 should not get the vaccine. Tell your doctor if the person being vaccinated has any severe allergies.  If the person scheduled for vaccination is not feeling well, your healthcare provider might decide to reschedule the shot on another day.  4. Risks of a vaccine reaction  With any medicine, including vaccines, there is a chance of reactions. These are usually mild and go away on their own, but serious reactions are also possible.  Problems reported following PCV13 varied by age and dose in the series. The most common problems reported among children were:  · About half became drowsy after the shot, had a temporary loss of appetite, or had redness or tenderness where the shot was given.  · About 1 out of 3 had swelling where the shot was given.  · About 1 out of 3 had a mild fever, and about 1 in 20 had a fever over 102.2°F.  · Up to about 8 out of 10 became fussy or irritable.  Adults have reported pain, redness, and swelling where the shot was given; also mild fever, fatigue, headache, chills, or muscle pain.  Young children who get PCV13 along with inactivated flu vaccine at the same time may be at increased risk for seizures caused by fever. Ask your doctor for more information.  Problems that   could happen after any vaccine:  · People sometimes faint after a medical procedure, including vaccination. Sitting or lying down for about 15 minutes can help prevent fainting, and injuries caused by a fall. Tell your doctor if you feel dizzy, or have vision changes or ringing in the ears.  · Some older children and adults get severe pain in the shoulder and have difficulty moving the arm where a shot was given. This happens very rarely.  · Any medication can cause a severe allergic reaction. Such  reactions from a vaccine are very rare, estimated at about 1 in a million doses, and would happen within a few minutes to a few hours after the vaccination.  As with any medicine, there is a very small chance of a vaccine causing a serious injury or death.  The safety of vaccines is always being monitored. For more information, visit: www.cdc.gov/vaccinesafety/  5. What if there is a serious reaction?  What should I look for?  · Look for anything that concerns you, such as signs of a severe allergic reaction, very high fever, or unusual behavior.  Signs of a severe allergic reaction can include hives, swelling of the face and throat, difficulty breathing, a fast heartbeat, dizziness, and weakness-usually within a few minutes to a few hours after the vaccination.  What should I do?  · If you think it is a severe allergic reaction or other emergency that can't wait, call 9-1-1 or get the person to the nearest hospital. Otherwise, call your doctor.  Reactions should be reported to the Vaccine Adverse Event Reporting System (VAERS). Your doctor should file this report, or you can do it yourself through the VAERS web site at www.vaers.hhs.gov, or by calling 1-800-822-7967.  VAERS does not give medical advice.  6. The National Vaccine Injury Compensation Program  The National Vaccine Injury Compensation Program (VICP) is a federal program that was created to compensate people who may have been injured by certain vaccines.  Persons who believe they may have been injured by a vaccine can learn about the program and about filing a claim by calling 1-800-338-2382 or visiting the VICP website at www.hrsa.gov/vaccinecompensation. There is a time limit to file a claim for compensation.  7. How can I learn more?  · Ask your healthcare provider. He or she can give you the vaccine package insert or suggest other sources of information.  · Call your local or state health department.  · Contact the Centers for Disease Control and  Prevention (CDC):    Call 1-800-232-4636 (1-800-CDC-INFO) or    Visit CDC's website at www.cdc.gov/vaccines  Vaccine Information Statement  PCV13 Vaccine (10/03/2014)     This information is not intended to replace advice given to you by your health care provider. Make sure you discuss any questions you have with your health care provider.     Document Released: 09/12/2006 Document Revised: 12/06/2014 Document Reviewed: 10/10/2014  Elsevier Interactive Patient Education ©2016 Elsevier Inc.

## 2016-08-03 NOTE — Progress Notes (Signed)
Patient: Donna Santos, Female    DOB: 15-Jan-1925, 82 y.o.   MRN: [id number] Visit Date: 08/03/2016  Today's Provider: Halina Maidens, MD   Chief Complaint  Patient presents with  . Medicare Wellness   Subjective:    Annual wellness visit [name] is a 82 y.o. female who presents today for her Subsequent Annual Wellness Visit. She feels well. She reports exercising none. She reports she is sleeping well.   ----------------------------------------------------------- HPI  CAD - followed by Cardiology at [facility].  No medication changes, chest pain or SOB.  BP has been fairly well controlled with systolic BP < [id number].  Memory loss - per family this is mild and stable.  She still drives short distances and lives alone.  She has had no problems with close family support.  Breast cancer with recent chest wall mets - completed XRT and is now on oral therapy.  She feels well with no new lesions.  Oncology sees her regularly and will schedule mammograms, etc.  Knee OA - mild problems with intermittent knee discomfort.  She does not take any pain medication on a regular basis.  She uses a cane when outside the house.  Review of Systems  Constitutional: Negative for chills, fatigue and fever.  HENT: Negative for congestion, hearing loss, tinnitus, trouble swallowing and voice change.   Eyes: Negative for visual disturbance.  Respiratory: Negative for cough, chest tightness, shortness of breath and wheezing.   Cardiovascular: Negative for chest pain, palpitations and leg swelling.  Gastrointestinal: Negative for abdominal pain, constipation, diarrhea and vomiting.  Endocrine: Negative for polydipsia and polyuria.  Genitourinary: Negative for dysuria, frequency, genital sores, hematuria, vaginal bleeding and vaginal discharge.  Musculoskeletal: Positive for arthralgias (right knee). Negative for gait problem and joint swelling.  Skin: Negative for color change and rash.  Neurological:  Negative for dizziness, tremors, light-headedness and headaches.  Hematological: Negative for adenopathy. Does not bruise/bleed easily.  Psychiatric/Behavioral: Positive for confusion. Negative for dysphoric mood and sleep disturbance. The patient is not nervous/anxious.     Social History   Social History  . Marital status: Widowed    Spouse name: N/A  . Number of children: N/A  . Years of education: N/A   Occupational History  . Not on file.   Social History Main Topics  . Smoking status: Former Smoker    Quit date: 10/04/1952  . Smokeless tobacco: Never Used  . Alcohol use No  . Drug use: No  . Sexual activity: Not on file   Other Topics Concern  . Not on file   Social History Narrative  . No narrative on file    Patient Active Problem List   Diagnosis Date Noted  . Senile dementia, uncomplicated 0000000  . Low back pain 04/12/2016  . Chest wall recurrence of right breast cancer ([place]) 09/09/2015  . Impaired renal function 05/09/2015  . Arteriosclerosis of coronary artery 05/09/2015  . Essential (primary) hypertension 05/09/2015  . Personal history of malignant neoplasm of breast 05/09/2015  . Muscle spasms of neck 05/09/2015  . Arthritis of shoulder region, degenerative 05/09/2015  . Current tobacco use 05/09/2015  . Hypercholesteremia 04/20/2012  . Carotid artery disease ([place]) 05/28/2005    Past Surgical History:  Procedure Laterality Date  . CAROTID ENDARTERECTOMY    . CORONARY ARTERY BYPASS GRAFT  2006  . MASS EXCISION Right 08/28/2015   Procedure: EXCISION MASS; remove massive right chest wall;  Surgeon: Florene Glen, MD;  Location: [facility] ORS;  Service: General;  Laterality: Right;  . MASTECTOMY, RADICAL Right 2002  . TOTAL ABDOMINAL HYSTERECTOMY      Her family history includes Cancer in her father; Hypertension in her father.    Previous Medications   AMLODIPINE (NORVASC) 5 MG TABLET    TAKE 1 TABLET BY MOUTH TWICE DAILY   ANASTROZOLE  (ARIMIDEX) 1 MG TABLET    Take 1 tablet (1 mg total) by mouth daily.   ASPIRIN EC 81 MG TABLET    Take 81 mg by mouth daily.   CALCIUM CARB-CHOLECALCIFEROL (CALCIUM 600+D) 600-800 MG-UNIT TABS    Take 1 tablet by mouth daily. Reported on 12/25/2015   CHLORTHALIDONE (HYGROTON) 25 MG TABLET    TAKE 1 TABLET BY MOUTH DAILY   CHOLECALCIFEROL (VITAMIN D) 1000 UNITS TABLET    Take 1,000 Units by mouth daily.   ISOSORBIDE MONONITRATE (IMDUR) 30 MG 24 HR TABLET    Take 1 tablet (30 mg total) by mouth daily.   LATANOPROST (XALATAN) 0.005 % OPHTHALMIC SOLUTION    INT 1 GTT INTO EACH EYE QHS.   LOSARTAN (COZAAR) 25 MG TABLET    TAKE 1 TABLET EVERY DAY   METOPROLOL (LOPRESSOR) 100 MG TABLET    Take 1 tablet (100 mg total) by mouth 2 (two) times daily.   NITROGLYCERIN (NITROSTAT) 0.4 MG SL TABLET    Place 0.4 mg under the tongue every 5 (five) minutes as needed for chest pain.   TIMOLOL (TIMOPTIC) 0.25 % OPHTHALMIC SOLUTION    INSTILL ONE DROP INTO BOTH EYES EVERY MORNING.    Patient Care Team: Glean Hess, MD as PCP - General (Family Medicine) Sol Passer, MD as Referring Physician (Cardiology)     Objective:   Vitals: BP (!) 150/78 (BP Location: Left Arm, Patient Position: Sitting, Cuff Size: Normal)   Physical Exam  Constitutional: She is oriented to person, place, and time. She appears well-developed and well-nourished. No distress.  HENT:  Head: Normocephalic and atraumatic.  Right Ear: Tympanic membrane and ear canal normal.  Left Ear: Tympanic membrane and ear canal normal.  Nose: Right sinus exhibits no maxillary sinus tenderness. Left sinus exhibits no maxillary sinus tenderness.  Mouth/Throat: Uvula is midline and oropharynx is clear and moist.  Eyes: Conjunctivae and EOM are normal. Right eye exhibits no discharge. Left eye exhibits no discharge. No scleral icterus.  Neck: Normal range of motion. Carotid bruit is not present. No erythema present. No thyromegaly present.    Cardiovascular: Normal rate, regular rhythm, normal heart sounds and normal pulses.   Pulmonary/Chest: Effort normal and breath sounds normal. No respiratory distress. She has no wheezes. Left breast exhibits no mass, no nipple discharge, no skin change and no tenderness.  Right mastectomy scar intact - no skin nodules noted  Abdominal: Soft. Bowel sounds are normal. There is no hepatosplenomegaly. There is no tenderness. There is no CVA tenderness.  Musculoskeletal: Normal range of motion.       Right knee: She exhibits effusion. She exhibits no swelling. No tenderness found.  Lymphadenopathy:    She has no cervical adenopathy.    She has no axillary adenopathy.  Neurological: She is alert and oriented to person, place, and time. She has normal reflexes. No cranial nerve deficit or sensory deficit.  Skin: Skin is warm, dry and intact. No rash noted.  Psychiatric: She has a normal mood and affect. Her speech is normal and behavior is normal. Thought content normal.  Nursing note and vitals reviewed.  Activities of Daily Living In your present state of health, do you have any difficulty performing the following activities: 08/03/2016 03/29/2016  Hearing? N N  Vision? Y N  Difficulty concentrating or making decisions? N Y  Walking or climbing stairs? Y Y  Dressing or bathing? Y N  Doing errands, shopping? Tempie Donning  Preparing Food and eating ? N -  Using the Toilet? N -  In the past six months, have you accidently leaked urine? N -  Do you have problems with loss of bowel control? N -  Managing your Medications? N -  Managing your Finances? N -  Housekeeping or managing your Housekeeping? N -  Some recent data might be hidden    Fall Risk Assessment Fall Risk  08/03/2016 03/29/2016 07/31/2015  Falls in the past year? No No No      Depression Screen PHQ 2/9 Scores 08/03/2016 03/29/2016 07/31/2015  PHQ - 2 Score 0 0 0    Cognitive Testing - 6-CIT   Correct? Score   What year is it? yes 0 Yes  = 0    No = 4  What month is it? yes 0 Yes = 0    No = 3  Remember:     Pia [address]au, [place], [place]     What time is it? yes 0 Yes = 0    No = 3  Count backwards from 20 to 1 yes 0 Correct = 0    1 error = 2   More than 1 error = 4  Say the months of the year in reverse. yes 0 Correct = 0    1 error = 2   More than 1 error = 4  What address did I ask you to remember? no 7 Correct = 0  1 error = 2    2 error = 4    3 error = 6    4 error = 8    All wrong = 10       TOTAL SCORE  7/28   Interpretation:  Normal  Normal (0-7) Abnormal (8-28)        Medicare Annual Wellness Visit Summary:  Reviewed patient's Family Medical History Reviewed and updated list of patient's medical providers Assessment of cognitive impairment was done Assessed patient's functional ability Established a written schedule for health screening [place] Completed and Reviewed  Exercise Activities and Dietary recommendations Goals    . Prevent Falls          Patient carries a cane when leaving the house.        Immunization History  Administered Date(s) Administered  . Pneumococcal Polysaccharide-23 11/30/2004    Health Maintenance  Topic Date Due  . PNA vac Low Risk Adult (2 of 2 - PCV13) 05/03/2017 (Originally 11/30/2005)  . ZOSTAVAX  05/03/2021 (Originally 01/16/1987)  . TETANUS/TDAP  05/03/2021 (Originally 01/16/1946)  . DEXA SCAN  Completed     Discussed health benefits of physical activity, and encouraged her to engage in regular exercise appropriate for her age and condition.    ------------------------------------------------------------------------------------------------------------   Assessment & Plan:  1. Medicare annual wellness visit, subsequent - POCT urinalysis dipstick Measures satisfied Patient will consider Prevnar-13 She declines flu vaccine  2. Essential (primary) hypertension controlled - CBC with Differential/Platelet - TSH  3. Carotid  artery disease, unspecified laterality (HCC) Asx; no statin therapy  4. Impaired renal function Continue to avoid nsaids; use tylenol if needed - Comprehensive  metabolic panel  5. Hypercholesteremia Continue diet  6. Chest wall recurrence of right breast cancer ([place]) Followed by Oncology  7. Arteriosclerosis of coronary artery Stable; continue Cardiology care  8. Arthritis of right knee May use tylenol as needed Cane for balance during ambulation  Halina Maidens, MD [facility]  08/03/2016

## 2016-08-04 LAB — COMPREHENSIVE METABOLIC PANEL
A/G RATIO: 1.5 (ref 1.2–2.2)
ALK PHOS: 43 IU/L (ref 39–117)
ALT: 10 IU/L (ref 0–32)
AST: 12 IU/L (ref 0–40)
Albumin: 4.5 g/dL (ref 3.5–4.7)
BUN/Creatinine Ratio: 20 (ref 12–28)
BUN: 18 mg/dL (ref 8–27)
Bilirubin Total: 0.4 mg/dL (ref 0.0–1.2)
CALCIUM: 9.8 mg/dL (ref 8.7–10.3)
CHLORIDE: 92 mmol/L — AB (ref 96–106)
CO2: 25 mmol/L (ref 18–29)
Creatinine, Ser: 0.91 mg/dL (ref 0.57–1.00)
GFR calc Af Amer: 65 mL/min/{1.73_m2} (ref >59)
GFR, EST NON AFRICAN AMERICAN: 56 mL/min/{1.73_m2} — AB (ref >59)
Globulin, Total: 3.1 g/dL (ref 1.5–4.5)
Glucose: 108 mg/dL — ABNORMAL HIGH (ref 65–99)
POTASSIUM: 3.4 mmol/L — AB (ref 3.5–5.2)
Sodium: 137 mmol/L (ref 134–144)
Total Protein: 7.6 g/dL (ref 6.0–8.5)

## 2016-08-04 LAB — CBC WITH DIFFERENTIAL/PLATELET
Basophils Absolute: 0.1 10*3/uL (ref 0.0–0.2)
Basos: 1 %
EOS (ABSOLUTE): 0.1 10*3/uL (ref 0.0–0.4)
Eos: 2 %
Hematocrit: 43.3 % (ref 34.0–46.6)
Hemoglobin: 14.5 g/dL (ref 11.1–15.9)
IMMATURE GRANULOCYTES: 0 %
Immature Grans (Abs): 0 10*3/uL (ref 0.0–0.1)
Lymphocytes Absolute: 1.2 10*3/uL (ref 0.7–3.1)
Lymphs: 16 %
MCH: 31.6 pg (ref 26.6–33.0)
MCHC: 33.5 g/dL (ref 31.5–35.7)
MCV: 94 fL (ref 79–97)
MONOS ABS: 0.6 10*3/uL (ref 0.1–0.9)
Monocytes: 8 %
NEUTROS PCT: 73 %
Neutrophils Absolute: 5.4 10*3/uL (ref 1.4–7.0)
PLATELETS: 401 10*3/uL — AB (ref 150–379)
RBC: 4.59 x10E6/uL (ref 3.77–5.28)
RDW: 13.8 % (ref 12.3–15.4)
WBC: 7.4 10*3/uL (ref 3.4–10.8)

## 2016-08-04 LAB — TSH: TSH: 1.74 u[IU]/mL (ref 0.450–4.500)

## 2016-08-09 DIAGNOSIS — M25561 Pain in right knee: Secondary | ICD-10-CM | POA: Diagnosis not present

## 2016-08-09 DIAGNOSIS — S6992XA Unspecified injury of left wrist, hand and finger(s), initial encounter: Secondary | ICD-10-CM | POA: Diagnosis not present

## 2016-08-09 DIAGNOSIS — M1711 Unilateral primary osteoarthritis, right knee: Secondary | ICD-10-CM | POA: Diagnosis not present

## 2016-08-09 DIAGNOSIS — Z23 Encounter for immunization: Secondary | ICD-10-CM | POA: Diagnosis not present

## 2016-08-09 DIAGNOSIS — I10 Essential (primary) hypertension: Secondary | ICD-10-CM | POA: Diagnosis not present

## 2016-08-09 DIAGNOSIS — M25461 Effusion, right knee: Secondary | ICD-10-CM | POA: Diagnosis not present

## 2016-08-09 DIAGNOSIS — Z853 Personal history of malignant neoplasm of breast: Secondary | ICD-10-CM | POA: Diagnosis not present

## 2016-08-09 DIAGNOSIS — Z79811 Long term (current) use of aromatase inhibitors: Secondary | ICD-10-CM | POA: Diagnosis not present

## 2016-08-09 DIAGNOSIS — M25562 Pain in left knee: Secondary | ICD-10-CM | POA: Diagnosis not present

## 2016-08-09 DIAGNOSIS — S8991XA Unspecified injury of right lower leg, initial encounter: Secondary | ICD-10-CM | POA: Diagnosis not present

## 2016-08-09 DIAGNOSIS — I2510 Atherosclerotic heart disease of native coronary artery without angina pectoris: Secondary | ICD-10-CM | POA: Diagnosis not present

## 2016-08-09 DIAGNOSIS — S61412A Laceration without foreign body of left hand, initial encounter: Secondary | ICD-10-CM | POA: Diagnosis not present

## 2016-08-09 DIAGNOSIS — Z87891 Personal history of nicotine dependence: Secondary | ICD-10-CM | POA: Diagnosis not present

## 2016-08-09 DIAGNOSIS — Z7982 Long term (current) use of aspirin: Secondary | ICD-10-CM | POA: Diagnosis not present

## 2016-08-09 DIAGNOSIS — M8580 Other specified disorders of bone density and structure, unspecified site: Secondary | ICD-10-CM | POA: Diagnosis not present

## 2016-08-09 DIAGNOSIS — Z79899 Other long term (current) drug therapy: Secondary | ICD-10-CM | POA: Diagnosis not present

## 2016-08-09 DIAGNOSIS — M1812 Unilateral primary osteoarthritis of first carpometacarpal joint, left hand: Secondary | ICD-10-CM | POA: Diagnosis not present

## 2016-08-11 ENCOUNTER — Ambulatory Visit (INDEPENDENT_AMBULATORY_CARE_PROVIDER_SITE_OTHER): Payer: Medicare Other | Admitting: Internal Medicine

## 2016-08-11 ENCOUNTER — Encounter: Payer: Self-pay | Admitting: Internal Medicine

## 2016-08-11 VITALS — BP 130/80 | HR 63 | Resp 16 | Ht 60.0 in | Wt 151.0 lb

## 2016-08-11 DIAGNOSIS — S61512D Laceration without foreign body of left wrist, subsequent encounter: Secondary | ICD-10-CM

## 2016-08-11 DIAGNOSIS — S8000XD Contusion of unspecified knee, subsequent encounter: Secondary | ICD-10-CM | POA: Diagnosis not present

## 2016-08-11 DIAGNOSIS — S63502D Unspecified sprain of left wrist, subsequent encounter: Secondary | ICD-10-CM | POA: Diagnosis not present

## 2016-08-11 NOTE — Progress Notes (Signed)
Date:  08/11/2016   Name:  Donna Santos   DOB:  1925/11/18   MRN:  [id number]   Chief Complaint: Fall (Left hand and Right knee injury Monday 4PM)  ER follow up - patient sustained a fall at home 2 days ago. She presented to the emergency room at [facility] with skin tear and pain in her left wrist and right knee. She did not hit her head or lose consciousness at the time of the fall. X-rays of her wrist and knee were negative for fracture.  No CT head was performed. Her wound was dressed with steri-strips and she was given tetanus booster. Her left wrist was placed in a splint.  Review of Systems  Constitutional: Negative for chills, fatigue and fever.  Respiratory: Negative for chest tightness and shortness of breath.   Cardiovascular: Positive for leg swelling. Negative for chest pain and palpitations.  Gastrointestinal: Negative for abdominal pain, diarrhea and nausea.  Musculoskeletal: Positive for arthralgias (right knee, left wrist).  Skin: Positive for wound.  Neurological: Negative for syncope, weakness and numbness.    Patient Active Problem List   Diagnosis Date Noted  . Arthritis of right knee 08/03/2016  . Senile dementia, uncomplicated 0000000  . Low back pain 04/12/2016  . Chest wall recurrence of right breast cancer ([place]) 09/09/2015  . Impaired renal function 05/09/2015  . Arteriosclerosis of coronary artery 05/09/2015  . Essential (primary) hypertension 05/09/2015  . Personal history of malignant neoplasm of breast 05/09/2015  . Muscle spasms of neck 05/09/2015  . Arthritis of shoulder region, degenerative 05/09/2015  . Current tobacco use 05/09/2015  . Hypercholesteremia 04/20/2012  . Carotid artery disease ([place]) 05/28/2005    Prior to Admission medications   Medication Sig Start Date End Date Taking? Authorizing Provider  amLODipine (NORVASC) 5 MG tablet TAKE 1 TABLET BY MOUTH TWICE DAILY 03/30/16  Yes Glean Hess, MD  anastrozole (ARIMIDEX) 1 MG  tablet Take 1 tablet (1 mg total) by mouth daily. 09/09/15  Yes Cammie Sickle, MD  aspirin EC 81 MG tablet Take 81 mg by mouth daily.   Yes Historical Provider, MD  Calcium Carb-Cholecalciferol (CALCIUM 600+D) 600-800 MG-UNIT TABS Take 1 tablet by mouth daily. Reported on 12/25/2015   Yes Historical Provider, MD  chlorthalidone (HYGROTON) 25 MG tablet TAKE 1 TABLET BY MOUTH DAILY 03/18/16  Yes Glean Hess, MD  cholecalciferol (VITAMIN D) 1000 UNITS tablet Take 1,000 Units by mouth daily.   Yes Historical Provider, MD  isosorbide mononitrate (IMDUR) 30 MG 24 hr tablet Take 1 tablet (30 mg total) by mouth daily. 05/03/16 05/03/17 Yes Glean Hess, MD  latanoprost (XALATAN) 0.005 % ophthalmic solution INT 1 GTT INTO EACH EYE QHS.   Yes Historical Provider, MD  losartan (COZAAR) 25 MG tablet TAKE 1 TABLET EVERY DAY 02/22/16  Yes Glean Hess, MD  metoprolol (LOPRESSOR) 100 MG tablet Take 1 tablet (100 mg total) by mouth 2 (two) times daily. 05/03/16  Yes Glean Hess, MD  nitroGLYCERIN (NITROSTAT) 0.4 MG SL tablet Place 0.4 mg under the tongue every 5 (five) minutes as needed for chest pain.   Yes Historical Provider, MD  timolol (TIMOPTIC) 0.25 % ophthalmic solution INSTILL ONE DROP INTO BOTH EYES EVERY MORNING.   Yes Historical Provider, MD    Allergies  Allergen Reactions  . Ace Inhibitors     Other reaction(s): Angioedema    Past Surgical History:  Procedure Laterality Date  . CAROTID ENDARTERECTOMY    .  CORONARY ARTERY BYPASS GRAFT  2006  . MASS EXCISION Right 08/28/2015   Procedure: EXCISION MASS; remove massive right chest wall;  Surgeon: Florene Glen, MD;  Location: [facility] ORS;  Service: General;  Laterality: Right;  . MASTECTOMY, RADICAL Right 2002  . TOTAL ABDOMINAL HYSTERECTOMY      Social History  Substance Use Topics  . Smoking status: Former Smoker    Quit date: 10/04/1952  . Smokeless tobacco: Never Used  . Alcohol use No     Medication list has been  reviewed and updated.   Physical Exam  Constitutional: She is oriented to person, place, and time. She appears well-developed. No distress.  HENT:  Head: Normocephalic and atraumatic.  Cardiovascular:  Pulses:      Radial pulses are 1+ on the right side, and 1+ on the left side.       Dorsalis pedis pulses are 1+ on the right side, and 1+ on the left side.  Pulmonary/Chest: Effort normal. No respiratory distress.  Musculoskeletal:       Left wrist: She exhibits decreased range of motion and tenderness.       Right knee: She exhibits swelling and ecchymosis. Tenderness found. Patellar tendon tenderness noted.  Neurological: She is alert and oriented to person, place, and time. She has normal strength. No sensory deficit.  Skin: Skin is warm and dry. No rash noted.  Skin tear on dorsum of left hand - steri-strips in place.  Wound edges fairly well approximated.  Mild surrounding erythema but no odor, drainage or warmth.  Psychiatric: She has a normal mood and affect. Her behavior is normal. Thought content normal.  Nursing note and vitals reviewed.   BP 130/80   Pulse 63   Resp 16   Ht 5' (1.524 m)   Wt 151 lb (68.5 kg)   SpO2 96%   BMI 29.49 kg/m   Assessment and Plan: 1. Contusion, knee, unspecified laterality, subsequent encounter Continue ice or heat; activity as tolerated  2. Wrist sprain, left, subsequent encounter Difficult to assess due to skin tear discomfort Follow up if not improving  3. Tear of skin of left wrist, subsequent encounter Dressing removed; TAO and gauze re-applied and wrapped with ACE. Daughter in law instructed in wound care - follow up with any concerns   Halina Maidens, MD [facility]  08/11/2016

## 2016-08-20 ENCOUNTER — Ambulatory Visit: Payer: Self-pay | Admitting: Internal Medicine

## 2016-08-20 DIAGNOSIS — Z7982 Long term (current) use of aspirin: Secondary | ICD-10-CM | POA: Diagnosis not present

## 2016-08-20 DIAGNOSIS — M9953 Intervertebral disc stenosis of neural canal of lumbar region: Secondary | ICD-10-CM | POA: Diagnosis not present

## 2016-08-20 DIAGNOSIS — Z79811 Long term (current) use of aromatase inhibitors: Secondary | ICD-10-CM | POA: Diagnosis not present

## 2016-08-20 DIAGNOSIS — I10 Essential (primary) hypertension: Secondary | ICD-10-CM | POA: Diagnosis not present

## 2016-08-20 DIAGNOSIS — Z951 Presence of aortocoronary bypass graft: Secondary | ICD-10-CM | POA: Diagnosis not present

## 2016-08-20 DIAGNOSIS — I2510 Atherosclerotic heart disease of native coronary artery without angina pectoris: Secondary | ICD-10-CM | POA: Diagnosis not present

## 2016-08-20 DIAGNOSIS — Z79899 Other long term (current) drug therapy: Secondary | ICD-10-CM | POA: Diagnosis not present

## 2016-08-20 DIAGNOSIS — M4806 Spinal stenosis, lumbar region: Secondary | ICD-10-CM | POA: Diagnosis not present

## 2016-08-20 DIAGNOSIS — Z853 Personal history of malignant neoplasm of breast: Secondary | ICD-10-CM | POA: Diagnosis not present

## 2016-08-20 DIAGNOSIS — M47816 Spondylosis without myelopathy or radiculopathy, lumbar region: Secondary | ICD-10-CM | POA: Diagnosis not present

## 2016-08-20 DIAGNOSIS — M47817 Spondylosis without myelopathy or radiculopathy, lumbosacral region: Secondary | ICD-10-CM | POA: Diagnosis not present

## 2016-08-20 DIAGNOSIS — M5126 Other intervertebral disc displacement, lumbar region: Secondary | ICD-10-CM | POA: Diagnosis not present

## 2016-08-20 DIAGNOSIS — Z87891 Personal history of nicotine dependence: Secondary | ICD-10-CM | POA: Diagnosis not present

## 2016-08-20 DIAGNOSIS — S39012A Strain of muscle, fascia and tendon of lower back, initial encounter: Secondary | ICD-10-CM | POA: Diagnosis not present

## 2016-08-25 ENCOUNTER — Other Ambulatory Visit: Payer: Self-pay | Admitting: Internal Medicine

## 2016-08-27 ENCOUNTER — Encounter: Payer: Self-pay | Admitting: Internal Medicine

## 2016-08-27 ENCOUNTER — Ambulatory Visit (INDEPENDENT_AMBULATORY_CARE_PROVIDER_SITE_OTHER): Payer: Medicare Other | Admitting: Internal Medicine

## 2016-08-27 VITALS — BP 122/82 | HR 52 | Resp 16 | Ht 60.0 in | Wt 151.0 lb

## 2016-08-27 DIAGNOSIS — M545 Low back pain, unspecified: Secondary | ICD-10-CM

## 2016-08-27 DIAGNOSIS — S8001XS Contusion of right knee, sequela: Secondary | ICD-10-CM

## 2016-08-27 NOTE — Progress Notes (Signed)
Date:  08/27/2016   Name:  Donna Santos   DOB:  [birth year]   MRN:  [id number]   Chief Complaint: Hospitalization Follow-up (Back Pain fu [facility] - also see notes from Urine Labs at [facility] showing some bacteria. Patient mind very confused and afraid it is UTI but [facility] has not called. ) UA and culture reviewed - cx negative. Her back pain was attributed to muscle strain and has improved with heat.  She is now walking with a cane only.  She drove today. Her left hand skin tear has almost completely healed.  She has minor wrist pain and swelling but is not limited in her activities. Her right knee is slightly red where there was a large bruise.  It is minimally tender but does not hurt to walk.  She denies fever or chills.  She may be slightly more confused but her son and daughter in law are keeping close watch over her.   Review of Systems  Constitutional: Negative for chills, fatigue and fever.  Respiratory: Negative for shortness of breath.   Cardiovascular: Negative for chest pain, palpitations and leg swelling.  Gastrointestinal: Negative for abdominal pain.  Musculoskeletal: Positive for arthralgias (right knee) and gait problem. Negative for back pain, neck pain and neck stiffness.  Skin: Positive for color change.  Neurological: Negative for dizziness, light-headedness and headaches.  Hematological: Negative for adenopathy.  Psychiatric/Behavioral: Negative for sleep disturbance.    Patient Active Problem List   Diagnosis Date Noted  . Arthritis of right knee 08/03/2016  . Senile dementia, uncomplicated 0000000  . Low back pain 04/12/2016  . Chest wall recurrence of right breast cancer ([place]) 09/09/2015  . Impaired renal function 05/09/2015  . Arteriosclerosis of coronary artery 05/09/2015  . Essential (primary) hypertension 05/09/2015  . Personal history of malignant neoplasm of breast 05/09/2015  . Muscle spasms of neck 05/09/2015  . Arthritis of shoulder region,  degenerative 05/09/2015  . Current tobacco use 05/09/2015  . Hypercholesteremia 04/20/2012  . Carotid artery disease ([place]) 05/28/2005    Prior to Admission medications   Medication Sig Start Date End Date Taking? Authorizing Provider  amLODipine (NORVASC) 5 MG tablet TAKE 1 TABLET BY MOUTH TWICE DAILY 03/30/16  Yes Glean Hess, MD  anastrozole (ARIMIDEX) 1 MG tablet Take 1 tablet (1 mg total) by mouth daily. 09/09/15  Yes Cammie Sickle, MD  aspirin EC 81 MG tablet Take 81 mg by mouth daily.   Yes Historical Provider, MD  Calcium Carb-Cholecalciferol (CALCIUM 600+D) 600-800 MG-UNIT TABS Take 1 tablet by mouth daily. Reported on 12/25/2015   Yes Historical Provider, MD  chlorthalidone (HYGROTON) 25 MG tablet TAKE 1 TABLET BY MOUTH DAILY 03/18/16  Yes Glean Hess, MD  cholecalciferol (VITAMIN D) 1000 UNITS tablet Take 1,000 Units by mouth daily.   Yes Historical Provider, MD  isosorbide mononitrate (IMDUR) 30 MG 24 hr tablet Take 1 tablet (30 mg total) by mouth daily. 05/03/16 05/03/17 Yes Glean Hess, MD  latanoprost (XALATAN) 0.005 % ophthalmic solution INT 1 GTT INTO EACH EYE QHS.   Yes Historical Provider, MD  losartan (COZAAR) 25 MG tablet TAKE 1 TABLET EVERY DAY 08/25/16  Yes Glean Hess, MD  metoprolol (LOPRESSOR) 100 MG tablet Take 1 tablet (100 mg total) by mouth 2 (two) times daily. 05/03/16  Yes Glean Hess, MD  nitroGLYCERIN (NITROSTAT) 0.4 MG SL tablet Place 0.4 mg under the tongue every 5 (five) minutes as needed for chest pain.  Yes Historical Provider, MD  timolol (TIMOPTIC) 0.25 % ophthalmic solution INSTILL ONE DROP INTO BOTH EYES EVERY MORNING.   Yes Historical Provider, MD    Allergies  Allergen Reactions  . Ace Inhibitors     Other reaction(s): Angioedema    Past Surgical History:  Procedure Laterality Date  . CAROTID ENDARTERECTOMY    . CORONARY ARTERY BYPASS GRAFT  2006  . MASS EXCISION Right 08/28/2015   Procedure: EXCISION MASS; remove  massive right chest wall;  Surgeon: Florene Glen, MD;  Location: [facility] ORS;  Service: General;  Laterality: Right;  . MASTECTOMY, RADICAL Right 2002  . TOTAL ABDOMINAL HYSTERECTOMY      Social History  Substance Use Topics  . Smoking status: Former Smoker    Quit date: 10/04/1952  . Smokeless tobacco: Never Used  . Alcohol use No     Medication list has been reviewed and updated.   Physical Exam  Constitutional: She is oriented to person, place, and time. She appears well-developed. No distress.  HENT:  Head: Normocephalic and atraumatic.  Neck: Normal range of motion. Neck supple.  Cardiovascular: Normal rate, regular rhythm and normal heart sounds.   Pulmonary/Chest: Effort normal and breath sounds normal. No respiratory distress. She has no wheezes.  Musculoskeletal: Normal range of motion. She exhibits no edema or tenderness.       Right knee: She exhibits no swelling and no effusion.       Legs: Neurological: She is alert and oriented to person, place, and time.  Skin: Skin is warm and dry. No rash noted.  Skin tear on dorsum of left hand - healed with residual small eschar.  Psychiatric: She has a normal mood and affect. Her behavior is normal. Thought content normal.  Nursing note and vitals reviewed.   BP 122/82   Pulse (!) 52   Resp 16   Ht 5' (1.524 m)   Wt 151 lb (68.5 kg)   SpO2 97%   BMI 29.49 kg/m   Assessment and Plan: 1. Right-sided low back pain without sciatica Much improved with conservative therapy Continue heat, use tylenol PRN  2. Knee contusion, right, sequela Resolving - monitor for worsening redness or swelling   Halina Maidens, MD [facility]  08/27/2016

## 2016-09-21 ENCOUNTER — Ambulatory Visit: Payer: Medicare Other

## 2016-09-29 DIAGNOSIS — H401131 Primary open-angle glaucoma, bilateral, mild stage: Secondary | ICD-10-CM | POA: Diagnosis not present

## 2016-09-29 DIAGNOSIS — Z961 Presence of intraocular lens: Secondary | ICD-10-CM | POA: Diagnosis not present

## 2016-10-18 ENCOUNTER — Ambulatory Visit: Payer: Medicare Other

## 2016-10-26 ENCOUNTER — Encounter: Payer: Self-pay | Admitting: Radiation Oncology

## 2016-10-26 ENCOUNTER — Other Ambulatory Visit: Payer: Self-pay | Admitting: *Deleted

## 2016-10-26 ENCOUNTER — Inpatient Hospital Stay: Payer: Medicare Other | Admitting: Internal Medicine

## 2016-10-26 ENCOUNTER — Ambulatory Visit
Admission: RE | Admit: 2016-10-26 | Discharge: 2016-10-26 | Disposition: A | Discharge status: Home / Self Care | Payer: Medicare Other | Source: Ambulatory Visit | Attending: Radiation Oncology | Admitting: Radiation Oncology

## 2016-10-26 ENCOUNTER — Inpatient Hospital Stay: Payer: Medicare Other | Attending: Internal Medicine

## 2016-10-26 VITALS — BP 163/68 | HR 61 | Temp 96.5°F | Resp 16 | Wt 151.0 lb

## 2016-10-26 DIAGNOSIS — C7989 Secondary malignant neoplasm of other specified sites: Principal | ICD-10-CM

## 2016-10-26 DIAGNOSIS — C50211 Malignant neoplasm of upper-inner quadrant of right female breast: Secondary | ICD-10-CM

## 2016-10-26 DIAGNOSIS — Z923 Personal history of irradiation: Secondary | ICD-10-CM | POA: Insufficient documentation

## 2016-10-26 DIAGNOSIS — Z79811 Long term (current) use of aromatase inhibitors: Secondary | ICD-10-CM | POA: Diagnosis not present

## 2016-10-26 DIAGNOSIS — C50911 Malignant neoplasm of unspecified site of right female breast: Secondary | ICD-10-CM

## 2016-10-26 DIAGNOSIS — Z170 Estrogen receptor positive status [ER+]: Secondary | ICD-10-CM | POA: Diagnosis not present

## 2016-10-26 DIAGNOSIS — R21 Rash and other nonspecific skin eruption: Secondary | ICD-10-CM | POA: Diagnosis not present

## 2016-10-26 NOTE — Progress Notes (Signed)
Radiation Oncology Follow up Note  Name: Donna Santos   Date:   10/26/2016 MRN:  [id number] DOB: Jun 01, 1925    This 82 y.o. female presents to the clinic today for 11 month follow-up for radiation therapy to her right chest wall and peripheral lymphatics   REFERRING PROVIDER: Glean Hess, MD  HPI: Patient is an 82 year old female now out over a year having completed radiation therapy to her right chest wall peripheral lymphatic status post modified radical mastectomy in 2002 with recurrent disease. Tumor was ER positive PR borderline currently on anastrozole tolerating that well without side effect. She has a rash on her back which she's had before not sure the etiology of that. She specifically denies any new nodularity in the right chest wall or any swelling in her right upper extremity..  COMPLICATIONS OF TREATMENT: none  FOLLOW UP COMPLIANCE: keeps appointments   PHYSICAL EXAM:  BP (!) 163/68   Pulse 61   Temp (!) 96.5 F (35.8 C)   Resp 16   Wt 151 lb 0.2 oz (68.5 kg)   BMI 29.49 kg/m  Patient has a maculopapular rash on her back extending over both sides. She status post right modified radical mastectomy no evidence of chest wall mass or nodularity is noted. Left breast is free of dominant mass or nodularity in 2 positions examined. No axillary or supraclavicular adenopathy is identified. Well-developed well-nourished patient in NAD. HEENT reveals PERLA, EOMI, discs not visualized.  Oral cavity is clear. No oral mucosal lesions are identified. Neck is clear without evidence of cervical or supraclavicular adenopathy. Lungs are clear to A&P. Cardiac examination is essentially unremarkable with regular rate and rhythm without murmur rub or thrill. Abdomen is benign with no organomegaly or masses noted. Motor sensory and DTR levels are equal and symmetric in the upper and lower extremities. Cranial nerves II through XII are grossly intact. Proprioception is intact. No  peripheral adenopathy or edema is identified. No motor or sensory levels are noted. Crude visual fields are within normal range.  RADIOLOGY RESULTS: Diagnostic mammograms of left breast have been ordered  PLAN: I'm starting the patient on Benadryl as well as some cortisone cream for her rash. She's to see her PMD if this persists. I've also ordered a diagnostic mammogram of her left breast. Otherwise I'm please were overall progress she has no evidence of disease. I've asked to see her back in 1 year for follow-up. Patient knows to call sooner with any concerns.  I would like to take this opportunity to thank you for allowing me to participate in the care of your patient.Armstead Peaks., MD

## 2016-10-27 ENCOUNTER — Other Ambulatory Visit: Payer: Self-pay | Admitting: *Deleted

## 2016-10-27 DIAGNOSIS — C50911 Malignant neoplasm of unspecified site of right female breast: Secondary | ICD-10-CM

## 2016-10-27 DIAGNOSIS — C7989 Secondary malignant neoplasm of other specified sites: Principal | ICD-10-CM

## 2016-10-29 ENCOUNTER — Other Ambulatory Visit: Payer: Self-pay | Admitting: *Deleted

## 2016-11-02 ENCOUNTER — Other Ambulatory Visit: Payer: Self-pay | Admitting: Internal Medicine

## 2016-11-02 DIAGNOSIS — I2510 Atherosclerotic heart disease of native coronary artery without angina pectoris: Secondary | ICD-10-CM

## 2016-11-04 ENCOUNTER — Ambulatory Visit
Admission: EM | Admit: 2016-11-04 | Discharge: 2016-11-04 | Disposition: A | Discharge status: Home / Self Care | Payer: Medicare Other | Attending: Family Medicine | Admitting: Family Medicine

## 2016-11-04 DIAGNOSIS — L03115 Cellulitis of right lower limb: Secondary | ICD-10-CM

## 2016-11-04 MED ORDER — SULFAMETHOXAZOLE-TRIMETHOPRIM 800-160 MG PO TABS: 1.0000 | ORAL_TABLET | Freq: Two times a day (BID) | ORAL | 0 refills | Status: DC

## 2016-11-04 MED ORDER — DOXYCYCLINE HYCLATE 100 MG PO CAPS: 100.0000 mg | ORAL_CAPSULE | Freq: Two times a day (BID) | ORAL | 0 refills | Status: DC

## 2016-11-04 MED ORDER — CEFTRIAXONE SODIUM 1 G IJ SOLR
1.0000 g | Freq: Once | INTRAMUSCULAR | Status: AC
  Administered 2016-11-04: 1 g via INTRAMUSCULAR

## 2016-11-04 MED ORDER — MUPIROCIN 2 % EX OINT: 1.0000 "application " | TOPICAL_OINTMENT | Freq: Three times a day (TID) | CUTANEOUS | 0 refills | Status: DC

## 2016-11-04 NOTE — ED Triage Notes (Signed)
Patient has red swollen, right lower leg that is weeping. It has been red for about 2 weeks and it started weeping today.

## 2016-11-04 NOTE — ED Provider Notes (Signed)
[facility]    CSN: [id number] Arrival date & time: 11/04/16  1524     History   Chief Complaint Chief Complaint  Patient presents with  . Cellulitis    HPI Donna Santos is a 82 y.o. female.   Patient is here because redness of her right lower leg. According to her that the redness just started this morning. Along with redness she's had weeping and oozing of the right lower leg as well. Right leg is markedly swollen and somewhat tender to palpation. According to the daughter the last time she saw her mother was Sunday she is not actually sure that this has not been read before today but apparently is definitely was read nothing that the patient noticed. She's recently had radiation for breast cancer and is undergoing breast cancer radiation treatment. He has hypertension hypercholesterolemia impaired renal function muscle spasms of the neck and history of previous tobacco abuse and atherosclerosis sclerotic coronary artery disease. She's had coronary artery bypass graft carotid endarterectomy and mastectomy as well as abdominal hysterectomy. Family medical history is not pertinent to today's visit.   The history is provided by the patient and a relative (The daughter). No language interpreter was used.  Abscess  Location:  Leg Leg abscess location:  R leg Abscess quality: draining, painful, redness, warmth and weeping   Red streaking: yes   Duration:  8 hours Progression:  Unchanged Pain details:    Quality:  Pressure, throbbing and tightness   Severity:  Moderate   Duration:  1 hour   Timing:  Constant   Progression:  Worsening Chronicity:  New Context: immunosuppression   Relieved by:  Nothing Worsened by:  Nothing Ineffective treatments:  None tried Associated symptoms: no nausea and no vomiting     Past Medical History:  Diagnosis Date  . Arteriosclerosis of coronary artery   . Arthritis of shoulder region   . Breast cancer ([place])   . Cataract   .  Essential hypertension   . Hypercholesteremia   . Impaired renal function   . Malignant neoplasm of breast ([place])   . Muscle spasms of neck   . Tobacco abuse     Patient Active Problem List   Diagnosis Date Noted  . Arthritis of right knee 08/03/2016  . Senile dementia, uncomplicated 0000000  . Low back pain 04/12/2016  . Chest wall recurrence of right breast cancer ([place]) 09/09/2015  . Impaired renal function 05/09/2015  . Arteriosclerosis of coronary artery 05/09/2015  . Essential (primary) hypertension 05/09/2015  . Personal history of malignant neoplasm of breast 05/09/2015  . Muscle spasms of neck 05/09/2015  . Arthritis of shoulder region, degenerative 05/09/2015  . Current tobacco use 05/09/2015  . Hypercholesteremia 04/20/2012  . Carotid artery disease ([place]) 05/28/2005    Past Surgical History:  Procedure Laterality Date  . CAROTID ENDARTERECTOMY    . CORONARY ARTERY BYPASS GRAFT  2006  . MASS EXCISION Right 08/28/2015   Procedure: EXCISION MASS; remove massive right chest wall;  Surgeon: Florene Glen, MD;  Location: [facility] ORS;  Service: General;  Laterality: Right;  . MASTECTOMY, RADICAL Right 2002  . TOTAL ABDOMINAL HYSTERECTOMY      OB History    No data available       Home Medications    Prior to Admission medications   Medication Sig Start Date End Date Taking? Authorizing Provider  amLODipine (NORVASC) 5 MG tablet TAKE 1 TABLET BY MOUTH TWICE DAILY 11/02/16  Yes Jesse Sans  Army Melia, MD  anastrozole (ARIMIDEX) 1 MG tablet Take 1 tablet (1 mg total) by mouth daily. 09/09/15  Yes Cammie Sickle, MD  aspirin EC 81 MG tablet Take 81 mg by mouth daily.   Yes Historical Provider, MD  Calcium Carb-Cholecalciferol (CALCIUM 600+D) 600-800 MG-UNIT TABS Take 1 tablet by mouth daily. Reported on 12/25/2015   Yes Historical Provider, MD  chlorthalidone (HYGROTON) 25 MG tablet TAKE 1 TABLET BY MOUTH DAILY 03/18/16  Yes Glean Hess, MD  cholecalciferol  (VITAMIN D) 1000 UNITS tablet Take 1,000 Units by mouth daily.   Yes Historical Provider, MD  isosorbide mononitrate (IMDUR) 30 MG 24 hr tablet TAKE 1 TABLET(30 MG) BY MOUTH DAILY 11/02/16  Yes Glean Hess, MD  latanoprost (XALATAN) 0.005 % ophthalmic solution INT 1 GTT INTO EACH EYE QHS.   Yes Historical Provider, MD  losartan (COZAAR) 25 MG tablet TAKE 1 TABLET EVERY DAY 08/25/16  Yes Glean Hess, MD  metoprolol (LOPRESSOR) 100 MG tablet Take 1 tablet (100 mg total) by mouth 2 (two) times daily. 05/03/16  Yes Glean Hess, MD  timolol (TIMOPTIC) 0.25 % ophthalmic solution INSTILL ONE DROP INTO BOTH EYES EVERY MORNING.   Yes Historical Provider, MD  mupirocin ointment (BACTROBAN) 2 % Apply 1 application topically 3 (three) times daily. 11/04/16   Frederich Cha, MD  nitroGLYCERIN (NITROSTAT) 0.4 MG SL tablet Place 0.4 mg under the tongue every 5 (five) minutes as needed for chest pain.    Historical Provider, MD  sulfamethoxazole-trimethoprim (BACTRIM DS,SEPTRA DS) 800-160 MG tablet Take 1 tablet by mouth 2 (two) times daily. 11/04/16   Frederich Cha, MD    Family History Family History  Problem Relation Age of Onset  . Hypertension Father   . Cancer Father     Social History Social History  Substance Use Topics  . Smoking status: Former Smoker    Quit date: 10/04/1952  . Smokeless tobacco: Never Used  . Alcohol use No     Allergies   Ace inhibitors   Review of Systems Review of Systems  Gastrointestinal: Negative for nausea and vomiting.  Skin: Positive for color change and rash.  All other systems reviewed and are negative.    Physical Exam Triage Vital Signs ED Triage Vitals  Enc Vitals Group     BP 11/04/16 1636 (!) 190/58     Pulse Rate 11/04/16 1636 63     Resp 11/04/16 1636 18     Temp 11/04/16 1636 98.1 F (36.7 C)     Temp Source 11/04/16 1636 Oral     SpO2 11/04/16 1636 98 %     Weight 11/04/16 1635 151 lb (68.5 kg)     Height --      Head  Circumference --      Peak Flow --      Pain Score 11/04/16 1636 0     Pain Loc --      Pain Edu? --      Excl. in [place]? --    No data found.   Updated Vital Signs BP (!) 190/58 (BP Location: Right Arm)   Pulse 63   Temp 98.1 F (36.7 C) (Oral)   Resp 18   Wt 151 lb (68.5 kg)   SpO2 98%   BMI 29.49 kg/m   Visual Acuity Right Eye Distance:   Left Eye Distance:   Bilateral Distance:    Right Eye Near:   Left Eye Near:    Bilateral Near:  Physical Exam  Constitutional: She appears well-developed.  In no acute distress elderly white female  HENT:  Head: Normocephalic and atraumatic. Not macrocephalic.  Eyes: Pupils are equal, round, and reactive to light.  Neck: Normal range of motion.  Pulmonary/Chest: Effort normal.  Musculoskeletal: She exhibits edema and tenderness. She exhibits no deformity.  Neurological: She is alert.  Skin: Rash noted. There is erythema.  Psychiatric: She has a normal mood and affect.  Vitals reviewed.    UC Treatments / Results  Labs (all labs ordered are listed, but only abnormal results are displayed) Labs Reviewed - No data to display  EKG  EKG Interpretation None       Radiology No results found.  Procedures Procedures (including critical care time)  Medications Ordered in UC Medications  cefTRIAXone (ROCEPHIN) injection 1 g (not administered)     Initial Impression / Assessment and Plan / UC Course  I have reviewed the triage vital signs and the nursing notes.  Pertinent labs & imaging results that were available during my care of the patient were reviewed by me and considered in my medical decision making (see chart for details).  Clinical Course     Will administer 1 g of Rocephin IM. We'll place on Bactroban ointment 3 times a day. Will also place on Septra DS 1 tablet twice a day for 10 days. Strongly suggest to her daughter that if this right leg with redness and weeping and oozing is not improving by  tomorrow they need to contact either Dr. Donella Stade or Dr. Army Melia for the patient be seen and reevaluated. They may at that time refer patient to the ED for possible IV antibiotics and that time lab work may be pertinent to obtain such as CBC and an BMP or CMP. At this point time since we are going to go ahead and administered a gram of Rocephin and will place or antibiotics and antibiotic ointment on the current worry about obtaining lab work. Because of her history of renal failure will change from Septra DS to doxycycline twice a day.   Final Clinical Impressions(s) / UC Diagnoses   Final diagnoses:  Cellulitis of right lower leg    New Prescriptions New Prescriptions   MUPIROCIN OINTMENT (BACTROBAN) 2 %    Apply 1 application topically 3 (three) times daily.   SULFAMETHOXAZOLE-TRIMETHOPRIM (BACTRIM DS,SEPTRA DS) 800-160 MG TABLET    Take 1 tablet by mouth 2 (two) times daily.     Note: This dictation was prepared with Dragon dictation along with smaller phrase technology. Any transcriptional errors that result from this process are unintentional.   Frederich Cha, MD 11/04/16 1715

## 2016-11-05 ENCOUNTER — Telehealth: Payer: Self-pay

## 2016-11-05 NOTE — Telephone Encounter (Signed)
Daughter called reporting patient went to [facility] dx with Cellulitis and they said if worse go to ER. What is worse?  Advised if redness is outlining the circle drawn or streaking up and down leg worse or if fever or lathargic or vomiting without holding meds for at least 20-30 min to ensure she got the meds in her. I advised to take meds on full stomach also.

## 2016-11-07 ENCOUNTER — Telehealth: Payer: Self-pay

## 2016-11-07 NOTE — Telephone Encounter (Signed)
Courtesy call back completed today for patient's recent visit at [facility]. Patient did not answer, left message on machine to call back with any questions or concerns.   

## 2016-11-10 ENCOUNTER — Telehealth: Payer: Self-pay

## 2016-11-10 NOTE — Telephone Encounter (Signed)
Called pt think that went to urgent care and need f/u on rash. Pt stated is getting much better but if worse will call for appointment.

## 2016-11-10 NOTE — Telephone Encounter (Signed)
I last saw her in September.  I do not know what rash she is talking about.

## 2016-11-10 NOTE — Telephone Encounter (Signed)
Pt called and said " My rash is spreading all over including my back"- wants a call back as to what to do

## 2016-11-21 DIAGNOSIS — I10 Essential (primary) hypertension: Secondary | ICD-10-CM | POA: Diagnosis not present

## 2016-11-21 DIAGNOSIS — R600 Localized edema: Secondary | ICD-10-CM | POA: Diagnosis not present

## 2016-11-21 DIAGNOSIS — L03115 Cellulitis of right lower limb: Secondary | ICD-10-CM | POA: Diagnosis not present

## 2016-11-21 DIAGNOSIS — L539 Erythematous condition, unspecified: Secondary | ICD-10-CM | POA: Diagnosis not present

## 2016-11-21 DIAGNOSIS — Z7982 Long term (current) use of aspirin: Secondary | ICD-10-CM | POA: Diagnosis not present

## 2016-11-21 DIAGNOSIS — Z79811 Long term (current) use of aromatase inhibitors: Secondary | ICD-10-CM | POA: Diagnosis not present

## 2016-11-21 DIAGNOSIS — R21 Rash and other nonspecific skin eruption: Secondary | ICD-10-CM | POA: Diagnosis not present

## 2016-11-21 DIAGNOSIS — I2510 Atherosclerotic heart disease of native coronary artery without angina pectoris: Secondary | ICD-10-CM | POA: Diagnosis not present

## 2016-11-21 DIAGNOSIS — Z951 Presence of aortocoronary bypass graft: Secondary | ICD-10-CM | POA: Diagnosis not present

## 2016-11-21 DIAGNOSIS — Z87891 Personal history of nicotine dependence: Secondary | ICD-10-CM | POA: Diagnosis not present

## 2016-11-24 ENCOUNTER — Telehealth: Payer: Self-pay

## 2016-11-24 NOTE — Telephone Encounter (Signed)
Patient was seen in ER for Cellulitis and was given Clindamycin. Asked to follow up but family says she is much better and redness has gone down no fever and no drainage. Instructed to finish ALL meds and advised I will call back if needing to be seen.

## 2016-12-06 ENCOUNTER — Ambulatory Visit
Admission: RE | Admit: 2016-12-06 | Discharge: 2016-12-06 | Disposition: A | Discharge status: Home / Self Care | Payer: Medicare Other | Source: Ambulatory Visit | Attending: Internal Medicine | Admitting: Internal Medicine

## 2016-12-06 ENCOUNTER — Ambulatory Visit
Admission: RE | Admit: 2016-12-06 | Discharge: 2016-12-06 | Disposition: A | Discharge status: Home / Self Care | Payer: Medicare Other | Source: Ambulatory Visit | Attending: Radiation Oncology | Admitting: Radiation Oncology

## 2016-12-06 DIAGNOSIS — Z13820 Encounter for screening for osteoporosis: Secondary | ICD-10-CM | POA: Insufficient documentation

## 2016-12-06 DIAGNOSIS — Z79811 Long term (current) use of aromatase inhibitors: Secondary | ICD-10-CM

## 2016-12-06 DIAGNOSIS — C50911 Malignant neoplasm of unspecified site of right female breast: Secondary | ICD-10-CM

## 2016-12-06 DIAGNOSIS — Z853 Personal history of malignant neoplasm of breast: Secondary | ICD-10-CM | POA: Insufficient documentation

## 2016-12-06 DIAGNOSIS — Z1231 Encounter for screening mammogram for malignant neoplasm of breast: Secondary | ICD-10-CM | POA: Diagnosis not present

## 2016-12-06 DIAGNOSIS — M810 Age-related osteoporosis without current pathological fracture: Secondary | ICD-10-CM | POA: Insufficient documentation

## 2016-12-06 DIAGNOSIS — C7989 Secondary malignant neoplasm of other specified sites: Principal | ICD-10-CM

## 2016-12-06 DIAGNOSIS — Z79899 Other long term (current) drug therapy: Secondary | ICD-10-CM | POA: Insufficient documentation

## 2016-12-21 ENCOUNTER — Other Ambulatory Visit: Payer: Self-pay

## 2016-12-21 ENCOUNTER — Ambulatory Visit
Admission: EM | Admit: 2016-12-21 | Discharge: 2016-12-21 | Disposition: A | Discharge status: Other Acute Inpatient Hospital | Payer: Medicare Other | Attending: Family Medicine | Admitting: Family Medicine

## 2016-12-21 ENCOUNTER — Inpatient Hospital Stay: Payer: Medicare Other | Attending: Internal Medicine | Admitting: Internal Medicine

## 2016-12-21 ENCOUNTER — Encounter: Payer: Self-pay | Admitting: Emergency Medicine

## 2016-12-21 VITALS — BP 135/79 | HR 105 | Temp 97.2°F | Wt 148.6 lb

## 2016-12-21 DIAGNOSIS — R0602 Shortness of breath: Secondary | ICD-10-CM | POA: Diagnosis not present

## 2016-12-21 DIAGNOSIS — Z9889 Other specified postprocedural states: Secondary | ICD-10-CM | POA: Insufficient documentation

## 2016-12-21 DIAGNOSIS — Z79811 Long term (current) use of aromatase inhibitors: Secondary | ICD-10-CM | POA: Diagnosis not present

## 2016-12-21 DIAGNOSIS — Z9011 Acquired absence of right breast and nipple: Secondary | ICD-10-CM | POA: Insufficient documentation

## 2016-12-21 DIAGNOSIS — C50919 Malignant neoplasm of unspecified site of unspecified female breast: Secondary | ICD-10-CM | POA: Diagnosis not present

## 2016-12-21 DIAGNOSIS — Z923 Personal history of irradiation: Secondary | ICD-10-CM | POA: Insufficient documentation

## 2016-12-21 DIAGNOSIS — Z170 Estrogen receptor positive status [ER+]: Secondary | ICD-10-CM | POA: Insufficient documentation

## 2016-12-21 DIAGNOSIS — M129 Arthropathy, unspecified: Secondary | ICD-10-CM

## 2016-12-21 DIAGNOSIS — I517 Cardiomegaly: Secondary | ICD-10-CM | POA: Diagnosis not present

## 2016-12-21 DIAGNOSIS — Z79899 Other long term (current) drug therapy: Secondary | ICD-10-CM | POA: Insufficient documentation

## 2016-12-21 DIAGNOSIS — R002 Palpitations: Secondary | ICD-10-CM

## 2016-12-21 DIAGNOSIS — I4519 Other right bundle-branch block: Secondary | ICD-10-CM | POA: Diagnosis not present

## 2016-12-21 DIAGNOSIS — I10 Essential (primary) hypertension: Secondary | ICD-10-CM

## 2016-12-21 DIAGNOSIS — H409 Unspecified glaucoma: Secondary | ICD-10-CM | POA: Diagnosis not present

## 2016-12-21 DIAGNOSIS — N2889 Other specified disorders of kidney and ureter: Secondary | ICD-10-CM | POA: Diagnosis not present

## 2016-12-21 DIAGNOSIS — C50911 Malignant neoplasm of unspecified site of right female breast: Secondary | ICD-10-CM

## 2016-12-21 DIAGNOSIS — C50811 Malignant neoplasm of overlapping sites of right female breast: Secondary | ICD-10-CM | POA: Diagnosis not present

## 2016-12-21 DIAGNOSIS — M47819 Spondylosis without myelopathy or radiculopathy, site unspecified: Secondary | ICD-10-CM | POA: Diagnosis not present

## 2016-12-21 DIAGNOSIS — E7800 Pure hypercholesterolemia, unspecified: Secondary | ICD-10-CM | POA: Diagnosis not present

## 2016-12-21 DIAGNOSIS — I2510 Atherosclerotic heart disease of native coronary artery without angina pectoris: Secondary | ICD-10-CM | POA: Diagnosis not present

## 2016-12-21 DIAGNOSIS — M19019 Primary osteoarthritis, unspecified shoulder: Secondary | ICD-10-CM | POA: Diagnosis not present

## 2016-12-21 DIAGNOSIS — R9431 Abnormal electrocardiogram [ECG] [EKG]: Secondary | ICD-10-CM | POA: Diagnosis not present

## 2016-12-21 DIAGNOSIS — R600 Localized edema: Secondary | ICD-10-CM | POA: Diagnosis not present

## 2016-12-21 DIAGNOSIS — Z87891 Personal history of nicotine dependence: Secondary | ICD-10-CM | POA: Insufficient documentation

## 2016-12-21 DIAGNOSIS — R0789 Other chest pain: Secondary | ICD-10-CM | POA: Diagnosis not present

## 2016-12-21 DIAGNOSIS — Z7982 Long term (current) use of aspirin: Secondary | ICD-10-CM | POA: Diagnosis not present

## 2016-12-21 DIAGNOSIS — M818 Other osteoporosis without current pathological fracture: Secondary | ICD-10-CM

## 2016-12-21 DIAGNOSIS — R079 Chest pain, unspecified: Secondary | ICD-10-CM | POA: Diagnosis not present

## 2016-12-21 DIAGNOSIS — Z9861 Coronary angioplasty status: Secondary | ICD-10-CM | POA: Diagnosis not present

## 2016-12-21 DIAGNOSIS — G3184 Mild cognitive impairment, so stated: Secondary | ICD-10-CM | POA: Diagnosis not present

## 2016-12-21 DIAGNOSIS — I4891 Unspecified atrial fibrillation: Secondary | ICD-10-CM | POA: Diagnosis not present

## 2016-12-21 DIAGNOSIS — C7989 Secondary malignant neoplasm of other specified sites: Secondary | ICD-10-CM

## 2016-12-21 MED ORDER — NITROGLYCERIN 0.4 MG SL SUBL: 0.4000 mg | SUBLINGUAL_TABLET | SUBLINGUAL | 0 refills | Status: DC | PRN

## 2016-12-21 NOTE — ED Triage Notes (Signed)
Patient c/o chest pain and palpitations that started on Sunday.  Patient denies chest pain at this time. Patient denies SOB.

## 2016-12-21 NOTE — Assessment & Plan Note (Signed)
RIGHT IPSILATERAL CHEST WALL RECURRENCE STATUS post mastectomy 2002- Cribiform type; ER/PR >90%; Her2 Neu-NEG. Status post radiation. Complete response noted. No masses felt on the chest wall. Currently on Arimidex. She is tolerating well without any major side effects.  # Patient has osteoporosis. Continue calcium and vitamin D twice a day. We'll plan repeat bone density test in oct 2017. This has been ordered.  # patient will follow-up with me in approximately 6 months/labs/ BMD

## 2016-12-21 NOTE — ED Provider Notes (Signed)
[facility]    CSN: [id number] Arrival date & time: 12/21/16  1241     History   Chief Complaint Chief Complaint  Patient presents with  . Chest Pain  . Palpitations    HPI [name] is a 83 y.o. female.   Patient is here with her daughter-in-law. She was seen at the cancer center and because of shortness of breath palpitations she was sent over here to be seen and evaluated. It turns out that patient never had a history of A. fib before or significant arrhythmia she does have a history of cardiac stents and history of cardiac ischemia from before. They states that her cardiologist is planning to retire has retired but she does all her care through the [facility] cardiac system. She started having this arrhythmia or palpitations on Sunday since then she took 2 nitroglycerin Glisson's on Sunday and maybe another one on Monday but she is not sure. The nitroglycerin seemed to help with the chest discomfort but didn't actually stop the arrhythmia. She is not having any chest discomfort now. Past medical history she has multiple medical problems including orthopnea increase left lower extremity edema weight gain stents. She does not taking statins because of surgeons over side effects. Along with the palpitation she's had some shortness of breath weakness and dizziness. There is a family history of hypertension cancer in father and breast cancer and family and she is currently being followed for breast cancer she's had a hysterectomy past as well.     The history is provided by the patient. No language interpreter was used.  Chest Pain  Pain location:  Unable to specify Pain quality: sharp and shooting   Pain radiates to:  Does not radiate Chronicity:  New Relieved by:  Nothing Worsened by:  Nothing Ineffective treatments:  Nitroglycerin Associated symptoms: palpitations and shortness of breath   Associated symptoms: no cough   Risk factors: hypertension   Palpitations    Associated symptoms: chest pain and shortness of breath   Associated symptoms: no cough     Past Medical History:  Diagnosis Date  . Arteriosclerosis of coronary artery   . Arthritis of shoulder region   . Breast cancer ([place]) 2002   RT MASTECTOMY  . Cataract   . Essential hypertension   . Hypercholesteremia   . Impaired renal function   . Muscle spasms of neck   . Tobacco abuse     Patient Active Problem List   Diagnosis Date Noted  . Carcinoma of overlapping sites of right breast in female, estrogen receptor positive ([place]) 12/21/2016  . Arthritis of right knee 08/03/2016  . Senile dementia, uncomplicated 0000000  . Low back pain 04/12/2016  . Impaired renal function 05/09/2015  . Arteriosclerosis of coronary artery 05/09/2015  . Essential (primary) hypertension 05/09/2015  . Personal history of malignant neoplasm of breast 05/09/2015  . Muscle spasms of neck 05/09/2015  . Arthritis of shoulder region, degenerative 05/09/2015  . Current tobacco use 05/09/2015  . Hypercholesteremia 04/20/2012  . Carotid artery disease ([place]) 05/28/2005    Past Surgical History:  Procedure Laterality Date  . CAROTID ENDARTERECTOMY    . CORONARY ARTERY BYPASS GRAFT  2006  . MASS EXCISION Right 08/28/2015   Procedure: EXCISION MASS; remove massive right chest wall;  Surgeon: Florene Glen, MD;  Location: [facility] ORS;  Service: General;  Laterality: Right;  . MASTECTOMY, RADICAL Right 2002   BREAST CA  . TOTAL ABDOMINAL HYSTERECTOMY  OB History    No data available       Home Medications    Prior to Admission medications   Medication Sig Start Date End Date Taking? Authorizing Provider  amLODipine (NORVASC) 5 MG tablet TAKE 1 TABLET BY MOUTH TWICE DAILY 11/02/16   Glean Hess, MD  anastrozole (ARIMIDEX) 1 MG tablet Take 1 tablet (1 mg total) by mouth daily. 09/09/15   Cammie Sickle, MD  aspirin EC 81 MG tablet Take 81 mg by mouth daily.    Historical Provider, MD   Calcium Carb-Cholecalciferol (CALCIUM 600+D) 600-800 MG-UNIT TABS Take 1 tablet by mouth daily. Reported on 12/25/2015    Historical Provider, MD  chlorthalidone (HYGROTON) 25 MG tablet TAKE 1 TABLET BY MOUTH DAILY 03/18/16   Glean Hess, MD  cholecalciferol (VITAMIN D) 1000 UNITS tablet Take 1,000 Units by mouth daily.    Historical Provider, MD  isosorbide mononitrate (IMDUR) 30 MG 24 hr tablet TAKE 1 TABLET(30 MG) BY MOUTH DAILY 11/02/16   Glean Hess, MD  latanoprost (XALATAN) 0.005 % ophthalmic solution INT 1 GTT INTO EACH EYE QHS.    Historical Provider, MD  losartan (COZAAR) 25 MG tablet TAKE 1 TABLET EVERY DAY 08/25/16   Glean Hess, MD  metoprolol (LOPRESSOR) 100 MG tablet Take 1 tablet (100 mg total) by mouth 2 (two) times daily. 05/03/16   Glean Hess, MD  nitroGLYCERIN (NITROSTAT) 0.4 MG SL tablet Place 0.4 mg under the tongue every 5 (five) minutes as needed for chest pain.    Historical Provider, MD  nitroGLYCERIN (NITROSTAT) 0.4 MG SL tablet Place 1 tablet (0.4 mg total) under the tongue every 5 (five) minutes as needed for chest pain. 12/21/16   Frederich Cha, MD  timolol (TIMOPTIC) 0.25 % ophthalmic solution INSTILL ONE DROP INTO BOTH EYES EVERY MORNING.    Historical Provider, MD    Family History Family History  Problem Relation Age of Onset  . Hypertension Father   . Cancer Father   . Breast cancer Neg Hx     Social History Social History  Substance Use Topics  . Smoking status: Former Smoker    Quit date: 10/04/1952  . Smokeless tobacco: Never Used  . Alcohol use No     Allergies   Ace inhibitors   Review of Systems Review of Systems  Respiratory: Positive for shortness of breath. Negative for cough and chest tightness.   Cardiovascular: Positive for chest pain and palpitations.  All other systems reviewed and are negative.    Physical Exam Triage Vital Signs ED Triage Vitals  Enc Vitals Group     BP 12/21/16 1256 97/80     Pulse Rate  12/21/16 1256 96     Resp 12/21/16 1256 17     Temp 12/21/16 1256 98.6 F (37 C)     Temp Source 12/21/16 1256 Oral     SpO2 12/21/16 1256 99 %     Weight 12/21/16 1257 148 lb (67.1 kg)     Height --      Head Circumference --      Peak Flow --      Pain Score 12/21/16 1257 0     Pain Loc --      Pain Edu? --      Excl. in [place]? --    No data found.   Updated Vital Signs BP 97/80 (BP Location: Left Arm)   Pulse 96   Temp 98.6 F (37 C) (Oral)  Resp 17   Wt 148 lb (67.1 kg)   SpO2 99%   BMI 28.90 kg/m   Visual Acuity Right Eye Distance:   Left Eye Distance:   Bilateral Distance:    Right Eye Near:   Left Eye Near:    Bilateral Near:     Physical Exam  Constitutional: She is oriented to person, place, and time. She appears well-developed and well-nourished.  HENT:  Head: Normocephalic and atraumatic.  Eyes: Pupils are equal, round, and reactive to light.  Neck: Normal range of motion. Neck supple.  Cardiovascular: Exam reveals gallop.   Pulmonary/Chest: Effort normal and breath sounds normal.  Abdominal: Soft.  Musculoskeletal: She exhibits tenderness.  Neurological: She is alert and oriented to person, place, and time.  Skin: Skin is warm.  Psychiatric: She has a normal mood and affect.  Vitals reviewed.    UC Treatments / Results  Labs (all labs ordered are listed, but only abnormal results are displayed) Labs Reviewed - No data to display  EKG  EKG Interpretation None       Radiology No results found.  Procedures Procedures (including critical care time)  Medications Ordered in UC Medications - No data to display   Initial Impression / Assessment and Plan / UC Course  I have reviewed the triage vital signs and the nursing notes.  Pertinent labs & imaging results that were available during my care of the patient were reviewed by me and considered in my medical decision making (see chart for details).   with new onset atrial  fibrillation. Will be transferred to the hospital and. Her cardiologist is at [facility] last stress test was about a year ago. Into discussing things with her daughter-in-law they have requested to go to [facility] system and not to [facility]. Daughter-in-law is agreed to take her to make sure she gets to [facility] and because she is taken 3 nitroglycerin over the last 2 days even though she's not having any chest pain now daughter asked if I can call in a prescription for nitroglycerin that they can pick up on the way to [facility] ED. They have decided to go to [facility] and not [place].   Final Clinical Impressions(s) / UC Diagnoses   Final diagnoses:  Heart palpitations  New onset atrial fibrillation [facility])    New Prescriptions Discharge Medication List as of 12/21/2016  1:43 PM       Note: This dictation was prepared with Dragon dictation along with smaller phrase technology. Any transcriptional errors that result from this process are unintentional.   Frederich Cha, MD 12/21/16 1358

## 2016-12-21 NOTE — Progress Notes (Signed)
Patient here today for follow up.  Patient states that she has been having chest pain/heaviness that started on Sunday, took 2 nitroglycerin pills on Sunday

## 2016-12-21 NOTE — Assessment & Plan Note (Addendum)
Chest wall recurrence of right breast cancer (HCC) RIGHT IPSILATERAL CHEST WALL RECURRENCE STATUS post mastectomy 2002- Cribiform type; ER/PR >90%; Her2 Neu-NEG. Status post radiation. Complete response noted. No masses felt on the chest wall. Currently on Arimidex. She is tolerating well without any major side effects.  # Patient has osteoporosis. Continue calcium and vitamin D twice a day. Bone density test from October 2017 revealed a T score -2.7. Discussed starting Prolia or Boniva. She would like to further think about it after going over side effects.   # Chest heaviness/tightness: HR: 105. BP 135/79. Took 3 Nitro tablets since Sunday at 3 am. Some relief. Recommended immediate evaluation at [facility]. Irregular heart rate and rhythm. Recommend follow-up with cardiologist and PCP.   # patient will follow-up with me in approximately 6 months/labs.   # 25 minutes face-to-face with the patient discussing the above plan of care; more than 50% of time spent on counseling and coordination. 

## 2016-12-21 NOTE — Progress Notes (Signed)
[facility] OFFICE PROGRESS NOTE  Patient Care Team: Glean Hess, MD as PCP - General (Family Medicine) Sol Passer, MD as Referring Physician (Cardiology) Cammie Sickle, MD as Consulting Physician (Internal Medicine) Noreene Filbert, MD as Referring Physician (Radiation Oncology)   SUMMARY OF ONCOLOGIC HISTORY: Oncology History   # 2002- Right Breast" Predominant DCIS- cribriform/low grade; 1.5cm with 5% early invasion"; ER 74%; PR-NEG; her 2 Neg; S/P Mastec & ALND [no residual ca/dcis] & Recon]; NO TAM  # 2016- CHEST WALL RECURRENCE [~2.5CM] s/p RIGHT excisional Biopsy; positive deep margin [ Dr.Cooper] Cribriform type; ER/PR >90%; HER 2 neu-NEG; s/p RT; Arimidex  # Lung nodule [110m LL; Oct 2016]; Left kidney ? 2.8cm Complex Cyst- [place]-NEG for mass; Pancreas 10 mm cyst  # OSTEOPOROSIS [BMD, OCT 2016] ca+ vit D      Carcinoma of overlapping sites of right breast in female, estrogen receptor positive [place]     INTERVAL HISTORY:  Donna Santos 83y.o. female patient with a diagnosis of isolated chest wall recurrence/not resectable ER/PR positive HER-2/neu negative  Currently status post radiation finished in of December 2016; on Arimidex is here for follow-up.  Today she complains of chest heaviness that started at 3 am Sunday morning. She denies chest pain just states that her chest feels tight. She has taken three nitroglycerin with limited relief. She states she has occasional shortness of breath. She did not go to her PCP because she knew she was coming to see [place].   Patient denies any hot flashes. Denies any new joint pains or muscle pain. Denies any lumps or bumps. Patient denies any symptoms at this time.  REVIEW OF SYSTEMS:  A complete 10 point review of system is done which is negative except mentioned above/history of present illness.   PAST MEDICAL HISTORY :  Past Medical History:  Diagnosis Date  . Arteriosclerosis of  coronary artery   . Arthritis of shoulder region   . Breast cancer ([place] 2002   RT MASTECTOMY  . Cataract   . Essential hypertension   . Hypercholesteremia   . Impaired renal function   . Muscle spasms of neck   . Tobacco abuse     PAST SURGICAL HISTORY :   Past Surgical History:  Procedure Laterality Date  . CAROTID ENDARTERECTOMY    . CORONARY ARTERY BYPASS GRAFT  2006  . MASS EXCISION Right 08/28/2015   Procedure: EXCISION MASS; remove massive right chest wall;  Surgeon: RFlorene Glen MD;  Location: [facility] ORS;  Service: General;  Laterality: Right;  . MASTECTOMY, RADICAL Right 2002   BREAST CA  . TOTAL ABDOMINAL HYSTERECTOMY      FAMILY HISTORY :   Family History  Problem Relation Age of Onset  . Hypertension Father   . Cancer Father   . Breast cancer Neg Hx     SOCIAL HISTORY:   Social History  Substance Use Topics  . Smoking status: Former Smoker    Quit date: 10/04/1952  . Smokeless tobacco: Never Used  . Alcohol use No    ALLERGIES:  is allergic to ace inhibitors.  MEDICATIONS:  Current Outpatient Prescriptions  Medication Sig Dispense Refill  . amLODipine (NORVASC) 5 MG tablet TAKE 1 TABLET BY MOUTH TWICE DAILY 60 tablet 12  . anastrozole (ARIMIDEX) 1 MG tablet Take 1 tablet (1 mg total) by mouth daily. 90 tablet 3  . aspirin EC 81 MG tablet Take 81 mg by mouth daily.    .Marland Kitchen  Calcium Carb-Cholecalciferol (CALCIUM 600+D) 600-800 MG-UNIT TABS Take 1 tablet by mouth daily. Reported on 12/25/2015    . chlorthalidone (HYGROTON) 25 MG tablet TAKE 1 TABLET BY MOUTH DAILY 30 tablet 12  . cholecalciferol (VITAMIN D) 1000 UNITS tablet Take 1,000 Units by mouth daily.    . isosorbide mononitrate (IMDUR) 30 MG 24 hr tablet TAKE 1 TABLET(30 MG) BY MOUTH DAILY 30 tablet 12  . latanoprost (XALATAN) 0.005 % ophthalmic solution INT 1 GTT INTO EACH EYE QHS.  6  . losartan (COZAAR) 25 MG tablet TAKE 1 TABLET EVERY DAY 30 tablet 12  . metoprolol (LOPRESSOR) 100 MG tablet Take  1 tablet (100 mg total) by mouth 2 (two) times daily. 60 tablet 5  . nitroGLYCERIN (NITROSTAT) 0.4 MG SL tablet Place 0.4 mg under the tongue every 5 (five) minutes as needed for chest pain.    Marland Kitchen timolol (TIMOPTIC) 0.25 % ophthalmic solution INSTILL ONE DROP INTO BOTH EYES EVERY MORNING.  6   No current facility-administered medications for this visit.     PHYSICAL EXAMINATION: ECOG PERFORMANCE STATUS: 0 - Asymptomatic  BP 135/79 (BP Location: Left Arm, Patient Position: Sitting)   Pulse (!) 105   Temp 97.2 F (36.2 C) (Tympanic)   Wt 148 lb 9.4 oz (67.4 kg)   BMI 29.02 kg/m   Filed Weights   12/21/16 1132  Weight: 148 lb 9.4 oz (67.4 kg)    GENERAL: Well-nourished well-developed; Alert, no distress and comfortable.   Accompanied by her daughterShe is walking herself. EYES: no pallor or icterus OROPHARYNX: no thrush or ulceration; good dentition  NECK: supple, no masses felt LYMPH:  no palpable lymphadenopathy in the cervical, axillary or inguinal regions LUNGS: clear to auscultation and  No wheeze or crackles HEART/CVS: irregular rate and rhythm; HR 105; No lower extremity edema ABDOMEN:abdomen soft, non-tender and normal bowel sounds Musculoskeletal:no cyanosis of digits and no clubbing  PSYCH: alert & oriented x 3 with fluent speech NEURO: no focal motor/sensory deficits SKIN:  no rashes or significant lesions; chest wall- above the right mastectomy incision; no masses felt.   LABORATORY DATA:  I have reviewed the data as listed    Component Value Date/Time   NA 137 08/03/2016 1153   K 3.4 (L) 08/03/2016 1153   CL 92 (L) 08/03/2016 1153   CO2 25 08/03/2016 1153   GLUCOSE 108 (H) 08/03/2016 1153   GLUCOSE 131 (H) 08/28/2015 0827   BUN 18 08/03/2016 1153   CREATININE 0.91 08/03/2016 1153   CALCIUM 9.8 08/03/2016 1153   PROT 7.6 08/03/2016 1153   ALBUMIN 4.5 08/03/2016 1153   AST 12 08/03/2016 1153   ALT 10 08/03/2016 1153   ALKPHOS 43 08/03/2016 1153   BILITOT  0.4 08/03/2016 1153   GFRNONAA 56 (L) 08/03/2016 1153   GFRAA 65 08/03/2016 1153    No results found for: SPEP, UPEP  Lab Results  Component Value Date   WBC 7.4 08/03/2016   NEUTROABS 5.4 08/03/2016   HGB 13.6 11/05/2015   HCT 43.3 08/03/2016   MCV 94 08/03/2016   PLT 401 (H) 08/03/2016      Chemistry      Component Value Date/Time   NA 137 08/03/2016 1153   K 3.4 (L) 08/03/2016 1153   CL 92 (L) 08/03/2016 1153   CO2 25 08/03/2016 1153   BUN 18 08/03/2016 1153   CREATININE 0.91 08/03/2016 1153      Component Value Date/Time   CALCIUM 9.8 08/03/2016 1153  ALKPHOS 43 08/03/2016 1153   AST 12 08/03/2016 1153   ALT 10 08/03/2016 1153   BILITOT 0.4 08/03/2016 1153        ASSESSMENT & PLAN:   Chest wall recurrence of right breast cancer ([place]) RIGHT IPSILATERAL CHEST WALL RECURRENCE STATUS post mastectomy 2002- Cribiform type; ER/PR >90%; Her2 Neu-NEG. Status post radiation. Complete response noted. No masses felt on the chest wall. Currently on Arimidex. She is tolerating well without any major side effects.  # Patient has osteoporosis. Continue calcium and vitamin D twice a day. We'll plan repeat bone density test in oct 2017. This has been ordered.  # patient will follow-up with me in approximately 6 months/labs/ BMD    Carcinoma of overlapping sites of right breast in female, estrogen receptor positive ([place]) Chest wall recurrence of right breast cancer ([place]) RIGHT IPSILATERAL CHEST WALL RECURRENCE STATUS post mastectomy 2002- Cribiform type; ER/PR >90%; Her2 Neu-NEG. Status post radiation. Complete response noted. No masses felt on the chest wall. Currently on Arimidex. She is tolerating well without any major side effects.  # Patient has osteoporosis. Continue calcium and vitamin D twice a day. Bone density test from October 2017 revealed a T score -2.7. Discussed starting Prolia or Boniva. She would like to further think about it after going over side effects.    # Chest heaviness/tightness: HR: 105. BP 135/79. Took 3 Nitro tablets since Sunday at 3 am. Some relief. Recommended immediate evaluation at [facility]. Irregular heart rate and rhythm. Recommend follow-up with cardiologist and PCP.   # patient will follow-up with me in approximately 6 months/labs.   # 25 minutes face-to-face with the patient discussing the above plan of care; more than 50% of time spent on counseling and coordination.     Cammie Sickle, MD 12/21/2016 1:20 PM

## 2016-12-28 DIAGNOSIS — I700 Atherosclerosis of aorta: Secondary | ICD-10-CM | POA: Diagnosis not present

## 2016-12-28 DIAGNOSIS — Z79811 Long term (current) use of aromatase inhibitors: Secondary | ICD-10-CM | POA: Diagnosis not present

## 2016-12-28 DIAGNOSIS — R9431 Abnormal electrocardiogram [ECG] [EKG]: Secondary | ICD-10-CM | POA: Diagnosis not present

## 2016-12-28 DIAGNOSIS — I4819 Other persistent atrial fibrillation: Secondary | ICD-10-CM | POA: Insufficient documentation

## 2016-12-28 DIAGNOSIS — I4510 Unspecified right bundle-branch block: Secondary | ICD-10-CM | POA: Diagnosis not present

## 2016-12-28 DIAGNOSIS — I2510 Atherosclerotic heart disease of native coronary artery without angina pectoris: Secondary | ICD-10-CM | POA: Diagnosis not present

## 2016-12-28 DIAGNOSIS — Z7901 Long term (current) use of anticoagulants: Secondary | ICD-10-CM | POA: Diagnosis not present

## 2016-12-28 DIAGNOSIS — I6529 Occlusion and stenosis of unspecified carotid artery: Secondary | ICD-10-CM | POA: Diagnosis not present

## 2016-12-28 DIAGNOSIS — Z7982 Long term (current) use of aspirin: Secondary | ICD-10-CM | POA: Diagnosis not present

## 2016-12-28 DIAGNOSIS — Z951 Presence of aortocoronary bypass graft: Secondary | ICD-10-CM | POA: Diagnosis not present

## 2016-12-28 DIAGNOSIS — I481 Persistent atrial fibrillation: Secondary | ICD-10-CM | POA: Diagnosis not present

## 2016-12-28 DIAGNOSIS — I491 Atrial premature depolarization: Secondary | ICD-10-CM | POA: Diagnosis not present

## 2016-12-28 DIAGNOSIS — Z87891 Personal history of nicotine dependence: Secondary | ICD-10-CM | POA: Diagnosis not present

## 2016-12-28 DIAGNOSIS — Z955 Presence of coronary angioplasty implant and graft: Secondary | ICD-10-CM | POA: Diagnosis not present

## 2016-12-28 DIAGNOSIS — E876 Hypokalemia: Secondary | ICD-10-CM | POA: Diagnosis not present

## 2016-12-28 DIAGNOSIS — Z853 Personal history of malignant neoplasm of breast: Secondary | ICD-10-CM | POA: Diagnosis not present

## 2016-12-28 DIAGNOSIS — Z79899 Other long term (current) drug therapy: Secondary | ICD-10-CM | POA: Diagnosis not present

## 2016-12-28 DIAGNOSIS — I10 Essential (primary) hypertension: Secondary | ICD-10-CM | POA: Diagnosis not present

## 2017-01-04 ENCOUNTER — Other Ambulatory Visit: Payer: Self-pay | Admitting: Internal Medicine

## 2017-01-04 ENCOUNTER — Ambulatory Visit (INDEPENDENT_AMBULATORY_CARE_PROVIDER_SITE_OTHER): Payer: Medicare Other | Admitting: Internal Medicine

## 2017-01-04 ENCOUNTER — Encounter: Payer: Self-pay | Admitting: Internal Medicine

## 2017-01-04 VITALS — BP 122/84 | HR 89 | Temp 97.6°F | Ht 60.0 in | Wt 152.0 lb

## 2017-01-04 DIAGNOSIS — I481 Persistent atrial fibrillation: Secondary | ICD-10-CM

## 2017-01-04 DIAGNOSIS — E876 Hypokalemia: Secondary | ICD-10-CM

## 2017-01-04 DIAGNOSIS — I10 Essential (primary) hypertension: Secondary | ICD-10-CM | POA: Diagnosis not present

## 2017-01-04 DIAGNOSIS — I4819 Other persistent atrial fibrillation: Secondary | ICD-10-CM

## 2017-01-04 DIAGNOSIS — R21 Rash and other nonspecific skin eruption: Secondary | ICD-10-CM

## 2017-01-04 NOTE — Progress Notes (Signed)
Date:  01/04/2017   Name:  Donna Santos   DOB:  [birth year]   MRN:  [id number]   Chief Complaint: Hospitalization Follow-up Sent to [facility] ER for new onset Atrial Fibrillation 12/21/16.  She was kept on 24 OBS and referred to Cardiology.  Last week at Cardiology she was started on Eliquis and potassium.  Her daughter is not certain that she got the Rx for potassium.  She needs to have it rechecked today. There is a plan for a follow up in 3-4 weeks to consider cardioversion. Rash - seen twice in UC with rash on her lower right leg. Dx'd as cellulitis.  Treated first with Doxycycline the Clindamycin.  Now leg is improved but still has a scaly surface with mild redness that comes and goes depending on the day.  No pain and only minimal itching.  Review of Systems  Constitutional: Negative for chills, fatigue and fever.  Respiratory: Negative for cough, chest tightness, shortness of breath and wheezing.   Cardiovascular: Negative for chest pain and palpitations.  Gastrointestinal: Negative for abdominal pain, nausea and vomiting.  Genitourinary: Negative for dysuria.  Skin: Positive for rash.  Neurological: Negative for dizziness, light-headedness and headaches.  Hematological: Does not bruise/bleed easily.    Patient Active Problem List   Diagnosis Date Noted  . Carcinoma of overlapping sites of right breast in female, estrogen receptor positive ([place]) 12/21/2016  . Arthritis of right knee 08/03/2016  . Senile dementia, uncomplicated 0000000  . Low back pain 04/12/2016  . Impaired renal function 05/09/2015  . Arteriosclerosis of coronary artery 05/09/2015  . Essential (primary) hypertension 05/09/2015  . Personal history of malignant neoplasm of breast 05/09/2015  . Muscle spasms of neck 05/09/2015  . Arthritis of shoulder region, degenerative 05/09/2015  . Current tobacco use 05/09/2015  . Hypercholesteremia 04/20/2012  . Carotid artery disease ([place]) 05/28/2005    Prior to  Admission medications   Medication Sig Start Date End Date Taking? Authorizing Provider  amLODipine (NORVASC) 5 MG tablet TAKE 1 TABLET BY MOUTH TWICE DAILY 11/02/16  Yes Glean Hess, MD  anastrozole (ARIMIDEX) 1 MG tablet Take 1 tablet (1 mg total) by mouth daily. 09/09/15  Yes Cammie Sickle, MD  aspirin EC 81 MG tablet Take 81 mg by mouth daily.   Yes Historical Provider, MD  Calcium Carb-Cholecalciferol (CALCIUM 600+D) 600-800 MG-UNIT TABS Take 1 tablet by mouth daily. Reported on 12/25/2015   Yes Historical Provider, MD  chlorthalidone (HYGROTON) 25 MG tablet TAKE 1 TABLET BY MOUTH DAILY 03/18/16  Yes Glean Hess, MD  cholecalciferol (VITAMIN D) 1000 UNITS tablet Take 1,000 Units by mouth daily.   Yes Historical Provider, MD  ELIQUIS 5 MG TABS tablet  12/28/16  Yes Historical Provider, MD  isosorbide mononitrate (IMDUR) 30 MG 24 hr tablet TAKE 1 TABLET(30 MG) BY MOUTH DAILY 11/02/16  Yes Glean Hess, MD  latanoprost (XALATAN) 0.005 % ophthalmic solution INT 1 GTT INTO EACH EYE QHS.   Yes Historical Provider, MD  losartan (COZAAR) 25 MG tablet TAKE 1 TABLET EVERY DAY 08/25/16  Yes Glean Hess, MD  metoprolol (LOPRESSOR) 100 MG tablet Take 1 tablet (100 mg total) by mouth 2 (two) times daily. 05/03/16  Yes Glean Hess, MD  nitroGLYCERIN (NITROSTAT) 0.4 MG SL tablet Place 0.4 mg under the tongue every 5 (five) minutes as needed for chest pain.   Yes Historical Provider, MD  nitroGLYCERIN (NITROSTAT) 0.4 MG SL tablet Place 1 tablet (0.4  mg total) under the tongue every 5 (five) minutes as needed for chest pain. 12/21/16  Yes Frederich Cha, MD  Potassium Chloride ER 20 MEQ TBCR Take by mouth. 12/28/16  Yes Historical Provider, MD  timolol (TIMOPTIC) 0.25 % ophthalmic solution INSTILL ONE DROP INTO BOTH EYES EVERY MORNING.   Yes Historical Provider, MD    Allergies  Allergen Reactions  . Ace Inhibitors     Other reaction(s): Angioedema    Past Surgical History:  Procedure  Laterality Date  . CAROTID ENDARTERECTOMY    . CORONARY ARTERY BYPASS GRAFT  2006  . MASS EXCISION Right 08/28/2015   Procedure: EXCISION MASS; remove massive right chest wall;  Surgeon: Florene Glen, MD;  Location: [facility] ORS;  Service: General;  Laterality: Right;  . MASTECTOMY, RADICAL Right 2002   BREAST CA  . TOTAL ABDOMINAL HYSTERECTOMY      Social History  Substance Use Topics  . Smoking status: Former Smoker    Quit date: 10/04/1952  . Smokeless tobacco: Never Used  . Alcohol use No     Medication list has been reviewed and updated.   Physical Exam  Constitutional: She is oriented to person, place, and time. She appears well-developed. No distress.  HENT:  Head: Normocephalic and atraumatic.  Cardiovascular: Normal rate.  An irregularly irregular rhythm present.  Pulmonary/Chest: Effort normal. No respiratory distress. She has no decreased breath sounds. She has no wheezes.  Musculoskeletal: Normal range of motion. She exhibits no edema.  Neurological: She is alert and oriented to person, place, and time.  Skin: Skin is warm and dry. Rash noted. There is erythema.     Psychiatric: She has a normal mood and affect. Her behavior is normal. Thought content normal.  Nursing note and vitals reviewed.   BP 122/84   Pulse 89   Temp 97.6 F (36.4 C)   Ht 5' (1.524 m)   Wt 152 lb (68.9 kg)   SpO2 99%   BMI 29.69 kg/m   Assessment and Plan: 1. Persistent atrial fibrillation ([place]) Now on Eliquis Follow up with Cardiology in 3-4 weeks  2. Essential (primary) hypertension controlled  3. Low blood potassium Continue supplementation - Basic metabolic panel  4. Rash and nonspecific skin eruption Continue moisturizing lotion Follow up if worsening   Halina Maidens, MD [facility]  01/04/2017

## 2017-01-05 LAB — BASIC METABOLIC PANEL
BUN / CREAT RATIO: 21 (ref 12–28)
BUN: 20 mg/dL (ref 8–27)
CO2: 23 mmol/L (ref 18–29)
CREATININE: 0.97 mg/dL (ref 0.57–1.00)
Calcium: 9.2 mg/dL (ref 8.7–10.3)
Chloride: 100 mmol/L (ref 96–106)
GFR calc Af Amer: 60 mL/min/{1.73_m2} (ref >59)
GFR, EST NON AFRICAN AMERICAN: 52 mL/min/{1.73_m2} — AB (ref >59)
Glucose: 131 mg/dL — ABNORMAL HIGH (ref 65–99)
Potassium: 4.1 mmol/L (ref 3.5–5.2)
SODIUM: 139 mmol/L (ref 134–144)

## 2017-01-27 DIAGNOSIS — I4891 Unspecified atrial fibrillation: Secondary | ICD-10-CM | POA: Diagnosis not present

## 2017-01-27 DIAGNOSIS — Z7982 Long term (current) use of aspirin: Secondary | ICD-10-CM | POA: Diagnosis not present

## 2017-01-27 DIAGNOSIS — Z79811 Long term (current) use of aromatase inhibitors: Secondary | ICD-10-CM | POA: Diagnosis not present

## 2017-01-27 DIAGNOSIS — Z853 Personal history of malignant neoplasm of breast: Secondary | ICD-10-CM | POA: Diagnosis not present

## 2017-01-27 DIAGNOSIS — R001 Bradycardia, unspecified: Secondary | ICD-10-CM | POA: Diagnosis not present

## 2017-01-27 DIAGNOSIS — I455 Other specified heart block: Secondary | ICD-10-CM | POA: Diagnosis not present

## 2017-01-27 DIAGNOSIS — I2510 Atherosclerotic heart disease of native coronary artery without angina pectoris: Secondary | ICD-10-CM | POA: Diagnosis not present

## 2017-01-27 DIAGNOSIS — Z7901 Long term (current) use of anticoagulants: Secondary | ICD-10-CM | POA: Diagnosis not present

## 2017-01-27 DIAGNOSIS — I10 Essential (primary) hypertension: Secondary | ICD-10-CM | POA: Diagnosis not present

## 2017-03-02 DIAGNOSIS — Z961 Presence of intraocular lens: Secondary | ICD-10-CM | POA: Diagnosis not present

## 2017-03-02 DIAGNOSIS — H401131 Primary open-angle glaucoma, bilateral, mild stage: Secondary | ICD-10-CM | POA: Diagnosis not present

## 2017-03-25 ENCOUNTER — Other Ambulatory Visit: Payer: Self-pay | Admitting: Internal Medicine

## 2017-05-12 DIAGNOSIS — D485 Neoplasm of uncertain behavior of skin: Secondary | ICD-10-CM | POA: Diagnosis not present

## 2017-05-12 DIAGNOSIS — L570 Actinic keratosis: Secondary | ICD-10-CM | POA: Diagnosis not present

## 2017-05-12 DIAGNOSIS — C44311 Basal cell carcinoma of skin of nose: Secondary | ICD-10-CM | POA: Diagnosis not present

## 2017-05-22 ENCOUNTER — Other Ambulatory Visit: Payer: Self-pay | Admitting: Internal Medicine

## 2017-05-22 DIAGNOSIS — I10 Essential (primary) hypertension: Secondary | ICD-10-CM

## 2017-05-30 DIAGNOSIS — C44311 Basal cell carcinoma of skin of nose: Secondary | ICD-10-CM | POA: Diagnosis not present

## 2017-06-21 ENCOUNTER — Inpatient Hospital Stay (HOSPITAL_BASED_OUTPATIENT_CLINIC_OR_DEPARTMENT_OTHER): Payer: Medicare Other | Admitting: Internal Medicine

## 2017-06-21 ENCOUNTER — Inpatient Hospital Stay: Payer: Medicare Other | Attending: Internal Medicine

## 2017-06-21 VITALS — BP 166/66 | HR 53 | Temp 96.7°F | Resp 16 | Wt 153.3 lb

## 2017-06-21 DIAGNOSIS — C50811 Malignant neoplasm of overlapping sites of right female breast: Secondary | ICD-10-CM | POA: Diagnosis not present

## 2017-06-21 DIAGNOSIS — Z923 Personal history of irradiation: Secondary | ICD-10-CM | POA: Diagnosis not present

## 2017-06-21 DIAGNOSIS — F17210 Nicotine dependence, cigarettes, uncomplicated: Secondary | ICD-10-CM | POA: Insufficient documentation

## 2017-06-21 DIAGNOSIS — Z79811 Long term (current) use of aromatase inhibitors: Secondary | ICD-10-CM | POA: Insufficient documentation

## 2017-06-21 DIAGNOSIS — M129 Arthropathy, unspecified: Secondary | ICD-10-CM | POA: Diagnosis not present

## 2017-06-21 DIAGNOSIS — Z809 Family history of malignant neoplasm, unspecified: Secondary | ICD-10-CM

## 2017-06-21 DIAGNOSIS — Z9011 Acquired absence of right breast and nipple: Secondary | ICD-10-CM | POA: Diagnosis not present

## 2017-06-21 DIAGNOSIS — I10 Essential (primary) hypertension: Secondary | ICD-10-CM

## 2017-06-21 DIAGNOSIS — I4891 Unspecified atrial fibrillation: Secondary | ICD-10-CM | POA: Insufficient documentation

## 2017-06-21 DIAGNOSIS — R918 Other nonspecific abnormal finding of lung field: Secondary | ICD-10-CM | POA: Insufficient documentation

## 2017-06-21 DIAGNOSIS — I2510 Atherosclerotic heart disease of native coronary artery without angina pectoris: Secondary | ICD-10-CM | POA: Diagnosis not present

## 2017-06-21 DIAGNOSIS — C50911 Malignant neoplasm of unspecified site of right female breast: Secondary | ICD-10-CM

## 2017-06-21 DIAGNOSIS — K862 Cyst of pancreas: Secondary | ICD-10-CM

## 2017-06-21 DIAGNOSIS — M810 Age-related osteoporosis without current pathological fracture: Secondary | ICD-10-CM | POA: Insufficient documentation

## 2017-06-21 DIAGNOSIS — Z87891 Personal history of nicotine dependence: Secondary | ICD-10-CM | POA: Insufficient documentation

## 2017-06-21 DIAGNOSIS — Z170 Estrogen receptor positive status [ER+]: Secondary | ICD-10-CM | POA: Diagnosis not present

## 2017-06-21 DIAGNOSIS — Z7982 Long term (current) use of aspirin: Secondary | ICD-10-CM

## 2017-06-21 DIAGNOSIS — Z7901 Long term (current) use of anticoagulants: Secondary | ICD-10-CM

## 2017-06-21 DIAGNOSIS — Z79899 Other long term (current) drug therapy: Secondary | ICD-10-CM | POA: Diagnosis not present

## 2017-06-21 DIAGNOSIS — E7800 Pure hypercholesterolemia, unspecified: Secondary | ICD-10-CM | POA: Insufficient documentation

## 2017-06-21 DIAGNOSIS — C7989 Secondary malignant neoplasm of other specified sites: Secondary | ICD-10-CM

## 2017-06-21 LAB — CBC WITH DIFFERENTIAL/PLATELET
BASOS PCT: 2 %
Basophils Absolute: 0.1 10*3/uL (ref 0–0.1)
EOS PCT: 3 %
Eosinophils Absolute: 0.2 10*3/uL (ref 0–0.7)
HCT: 42.4 % (ref 35.0–47.0)
Hemoglobin: 14.6 g/dL (ref 12.0–16.0)
Lymphocytes Relative: 16 %
Lymphs Abs: 1.1 10*3/uL (ref 1.0–3.6)
MCH: 31.2 pg (ref 26.0–34.0)
MCHC: 34.4 g/dL (ref 32.0–36.0)
MCV: 90.5 fL (ref 80.0–100.0)
MONO ABS: 0.9 10*3/uL (ref 0.2–0.9)
Monocytes Relative: 13 %
Neutro Abs: 4.8 10*3/uL (ref 1.4–6.5)
Neutrophils Relative %: 66 %
PLATELETS: 339 10*3/uL (ref 150–440)
RBC: 4.68 MIL/uL (ref 3.80–5.20)
RDW: 14.2 % (ref 11.5–14.5)
WBC: 7.1 10*3/uL (ref 3.6–11.0)

## 2017-06-21 LAB — COMPREHENSIVE METABOLIC PANEL
ALBUMIN: 4.2 g/dL (ref 3.5–5.0)
ALT: 12 U/L — ABNORMAL LOW (ref 14–54)
AST: 19 U/L (ref 15–41)
Alkaline Phosphatase: 39 U/L (ref 38–126)
Anion gap: 12 (ref 5–15)
BUN: 23 mg/dL — AB (ref 6–20)
CHLORIDE: 101 mmol/L (ref 101–111)
CO2: 24 mmol/L (ref 22–32)
Calcium: 9.4 mg/dL (ref 8.9–10.3)
Creatinine, Ser: 1.16 mg/dL — ABNORMAL HIGH (ref 0.44–1.00)
GFR calc Af Amer: 47 mL/min — ABNORMAL LOW (ref >60)
GFR, EST NON AFRICAN AMERICAN: 40 mL/min — AB (ref >60)
Glucose, Bld: 125 mg/dL — ABNORMAL HIGH (ref 65–99)
POTASSIUM: 3.8 mmol/L (ref 3.5–5.1)
Sodium: 137 mmol/L (ref 135–145)
Total Bilirubin: 0.6 mg/dL (ref 0.3–1.2)
Total Protein: 7.8 g/dL (ref 6.5–8.1)

## 2017-06-21 NOTE — Progress Notes (Signed)
[facility] OFFICE PROGRESS NOTE  Patient Care Team: Glean Hess, MD as PCP - General (Family Medicine) Sol Passer, MD as Referring Physician (Cardiology) Cammie Sickle, MD as Consulting Physician (Internal Medicine) Noreene Filbert, MD as Referring Physician (Radiation Oncology)   SUMMARY OF ONCOLOGIC HISTORY:  Oncology History   # 2002- Right Breast" Predominant DCIS- cribriform/low grade; 1.5cm with 5% early invasion"; ER 74%; PR-NEG; her 2 Neg; S/P Mastec & ALND [no residual ca/dcis] & Recon]; NO TAM  # 2016- CHEST WALL RECURRENCE [~2.5CM] s/p RIGHT excisional Biopsy; positive deep margin [ Dr.Cooper] Cribriform type; ER/PR >90%; HER 2 neu-NEG; s/p RT; Arimidex  # Lung nodule [1m LL; Oct 2016]; Left kidney ? 2.8cm Complex Cyst- [place]-NE[place] for mass; Pancreas 10 mm cyst  # OSTEOPOROSIS [BMD, OCT 2016] ca+ vit D      Carcinoma of overlapping sites of right breast in [place], [place]     INTERVAL HISTORY:  Donna Santos 83y.o. female patient isolated chest wall recurrence/not resectable ER/PR positive HER-2/neu negative  Currently status post radiation finished in of December 2016; on Arimidex is here for follow-up.  Patient denies any new lumps or bumps.  Patient denies any hot flashes. Denies any new joint pains or muscle pain. Denies any lumps or bumps. Patient denies any symptoms at this time.  REVIEW OF SYSTEMS:  A complete 10 point review of system is done which is negative except mentioned above/history of present illness.   PAST MEDICAL HISTORY :  Past Medical History:  Diagnosis Date  . Arteriosclerosis of coronary artery   . Arthritis of shoulder region   . [place]   RT MASTECTOMY  . Cataract   . Essential hypertension   . Hypercholesteremia   . Impaired renal function   . Muscle spasms of neck   . Tobacco abuse     PAST SURGICAL HISTORY :   Past Surgical History:   Procedure Laterality Date  . CAROTID ENDARTERECTOMY    . CORONARY ARTERY BYPASS GRAFT  2006  . MASS EXCISION Right 08/28/2015   Procedure: EXCISION MASS; remove massive right chest wall;  Surgeon: RFlorene Glen MD;  Location: [facility] ORS;  Service: General;  Laterality: Right;  . MASTECTOMY, RADICAL Right 2002   BREAST CA  . TOTAL ABDOMINAL HYSTERECTOMY      FAMILY HISTORY :   Family History  Problem Relation Age of Onset  . Hypertension Father   . Cancer Father   . Breast cancer Neg Hx     SOCIAL HISTORY:   Social History  Substance Use Topics  . Smoking status: Former Smoker    Quit date: 10/04/1952  . Smokeless tobacco: Never Used  . Alcohol use No    ALLERGIES:  is allergic to ace inhibitors.  MEDICATIONS:  Current Outpatient Prescriptions  Medication Sig Dispense Refill  . amLODipine (NORVASC) 5 MG tablet TAKE 1 TABLET BY MOUTH TWICE DAILY 60 tablet 12  . anastrozole (ARIMIDEX) 1 MG tablet Take 1 tablet (1 mg total) by mouth daily. 90 tablet 3  . aspirin EC 81 MG tablet Take 81 mg by mouth daily.    . Calcium Carb-Cholecalciferol (CALCIUM 600+D) 600-800 MG-UNIT TABS Take 1 tablet by mouth daily. Reported on 12/25/2015    . chlorthalidone (HYGROTON) 25 MG tablet TAKE 1 TABLET BY MOUTH DAILY 30 tablet 12  . cholecalciferol (VITAMIN D) 1000 UNITS tablet Take 1,000 Units by mouth daily.    .Marland Kitchen  ELIQUIS 5 MG TABS tablet     . isosorbide mononitrate (IMDUR) 30 MG 24 hr tablet TAKE 1 TABLET(30 MG) BY MOUTH DAILY 30 tablet 12  . latanoprost (XALATAN) 0.005 % ophthalmic solution INT 1 GTT INTO EACH EYE QHS.  6  . losartan (COZAAR) 25 MG tablet TAKE 1 TABLET EVERY DAY 30 tablet 12  . metoprolol tartrate (LOPRESSOR) 100 MG tablet TAKE 1 TABLET(100 MG) BY MOUTH TWICE DAILY 60 tablet 12  . nitroGLYCERIN (NITROSTAT) 0.4 MG SL tablet Place 0.4 mg under the tongue every 5 (five) minutes as needed for chest pain.    Marland Kitchen Potassium Chloride ER 20 MEQ TBCR Take by mouth.    . timolol  (TIMOPTIC) 0.25 % ophthalmic solution INSTILL ONE DROP INTO BOTH EYES EVERY MORNING.  6   No current facility-administered medications for this visit.     PHYSICAL EXAMINATION: ECOG PERFORMANCE STATUS: 0 - Asymptomatic  BP (!) 166/66 (BP Location: Left Arm)   Pulse (!) 53   Temp (!) 96.7 F (35.9 C) (Tympanic)   Resp 16   Wt 153 lb 5.3 oz (69.6 kg)   BMI 29.95 kg/m   Filed Weights   06/21/17 1118 06/21/17 1127  Weight: 153 lb 5.3 oz (69.6 kg) 153 lb 5.3 oz (69.6 kg)    GENERAL: Well-nourished well-developed; Alert, no distress and comfortable.   Accompanied by her daughterShe is walking herself. EYES: no pallor or icterus OROPHARYNX: no thrush or ulceration; good dentition  NECK: supple, no masses felt LYMPH:  no palpable lymphadenopathy in the cervical, axillary or inguinal regions LUNGS: clear to auscultation and  No wheeze or crackles HEART/CVS: regular rate & rhythm and no murmurs; No lower extremity edema ABDOMEN:abdomen soft, non-tender and normal bowel sounds Musculoskeletal:no cyanosis of digits and no clubbing  PSYCH: alert & oriented x 3 with fluent speech NEURO: no focal motor/sensory deficits SKIN:  no rashes or significant lesions; chest wall- above the right mastectomy incision; ~ 2cm soft mass felt right parasternal lesion.Marland Kitchen  LABORATORY DATA:  I have reviewed the data as listed    Component Value Date/Time   NA 137 06/21/2017 1054   NA 139 01/04/2017 1300   K 3.8 06/21/2017 1054   CL 101 06/21/2017 1054   CO2 24 06/21/2017 1054   GLUCOSE 125 (H) 06/21/2017 1054   BUN 23 (H) 06/21/2017 1054   BUN 20 01/04/2017 1300   CREATININE 1.16 (H) 06/21/2017 1054   CALCIUM 9.4 06/21/2017 1054   PROT 7.8 06/21/2017 1054   PROT 7.6 08/03/2016 1153   ALBUMIN 4.2 06/21/2017 1054   ALBUMIN 4.5 08/03/2016 1153   AST 19 06/21/2017 1054   ALT 12 (L) 06/21/2017 1054   ALKPHOS 39 06/21/2017 1054   BILITOT 0.6 06/21/2017 1054   BILITOT 0.4 08/03/2016 1153   GFRNONAA  40 (L) 06/21/2017 1054   GFRAA 47 (L) 06/21/2017 1054    No results found for: SPEP, UPEP  Lab Results  Component Value Date   WBC 7.1 06/21/2017   NEUTROABS 4.8 06/21/2017   HGB 14.6 06/21/2017   HCT 42.4 06/21/2017   MCV 90.5 06/21/2017   PLT 339 06/21/2017      Chemistry      Component Value Date/Time   NA 137 06/21/2017 1054   NA 139 01/04/2017 1300   K 3.8 06/21/2017 1054   CL 101 06/21/2017 1054   CO2 24 06/21/2017 1054   BUN 23 (H) 06/21/2017 1054   BUN 20 01/04/2017 1300   CREATININE  1.16 (H) 06/21/2017 1054      Component Value Date/Time   CALCIUM 9.4 06/21/2017 1054   ALKPHOS 39 06/21/2017 1054   AST 19 06/21/2017 1054   ALT 12 (L) 06/21/2017 1054   BILITOT 0.6 06/21/2017 1054   BILITOT 0.4 08/03/2016 1153     ASSESSMENT: The BMD measured at Femur Neck Right is 0.664 g/cm2 with a T-score of -2.7.    ASSESSMENT & PLAN:   Carcinoma of overlapping sites of right breast in female, estrogen receptor positive ([place]) # RIGHT IPSILATERAL CHEST WALL RECURRENCE STATUS post mastectomy 2002- Cribiform type; ER/PR >90%; Her2 Neu-NEG. Status post radiation. Complete response noted.   # No masses felt on the chest wall [? Scar tissue felt- monitor for now]. Currently on Arimidex. She is tolerating well without any major side effects.  # Patient has osteoporosis. BMD- jan 2018: -2.7. Long discussion regarding the risk factors ; potential treatment options of bisphosphonates including oral ; and injectables. Discussed specifically regarding Reclast/Prolia. Continue calcium and vitamin D twice a day. Discussed re: ONJ/ hypocalcemia. Patient reluctant. She'll call and let us know if she is interested.  # A.fib on Eliquis - no falls. Recommended to be extremely cautious about falls.   #  patient will follow-up with me in approximately 6 months/labs; will call if interested re: prolia.        Cammie Sickle, MD 06/21/2017 2:08 PM

## 2017-06-21 NOTE — Assessment & Plan Note (Addendum)
#  RIGHT IPSILATERAL CHEST WALL RECURRENCE STATUS post mastectomy 2002- Cribiform type; ER/PR >90%; Her2 Neu-NEG. Status post radiation. Complete response noted.   # No masses felt on the chest wall [? Scar tissue felt- monitor for now]. Currently on Arimidex. She is tolerating well without any major side effects.  # Patient has osteoporosis. BMD- jan 2018: -2.7. Long discussion regarding the risk factors ; potential treatment options of bisphosphonates including oral ; and injectables. Discussed specifically regarding Reclast/Prolia. Continue calcium and vitamin D twice a day. Discussed re: ONJ/ hypocalcemia. Patient reluctant. She'll call and let us know if she is interested.  # A.fib on Eliquis - no falls. Recommended to be extremely cautious about falls.   #  patient will follow-up with me in approximately 6 months/labs; will call if interested re: prolia.

## 2017-07-11 DIAGNOSIS — L570 Actinic keratosis: Secondary | ICD-10-CM | POA: Diagnosis not present

## 2017-07-11 DIAGNOSIS — C44311 Basal cell carcinoma of skin of nose: Secondary | ICD-10-CM | POA: Diagnosis not present

## 2017-07-11 DIAGNOSIS — L578 Other skin changes due to chronic exposure to nonionizing radiation: Secondary | ICD-10-CM | POA: Diagnosis not present

## 2017-09-06 DIAGNOSIS — H401131 Primary open-angle glaucoma, bilateral, mild stage: Secondary | ICD-10-CM | POA: Diagnosis not present

## 2017-09-06 DIAGNOSIS — Z961 Presence of intraocular lens: Secondary | ICD-10-CM | POA: Diagnosis not present

## 2017-09-14 ENCOUNTER — Other Ambulatory Visit: Payer: Self-pay | Admitting: Internal Medicine

## 2017-09-14 NOTE — Telephone Encounter (Signed)
Patient has awv visit with amy tomorrow at 3pm and her physical is next February.

## 2017-09-15 ENCOUNTER — Ambulatory Visit (INDEPENDENT_AMBULATORY_CARE_PROVIDER_SITE_OTHER): Payer: Medicare Other

## 2017-09-15 VITALS — BP 142/60 | HR 64 | Temp 98.5°F | Resp 16 | Ht 60.0 in | Wt 150.4 lb

## 2017-09-15 DIAGNOSIS — Z853 Personal history of malignant neoplasm of breast: Secondary | ICD-10-CM | POA: Diagnosis not present

## 2017-09-15 DIAGNOSIS — Z0000 Encounter for general adult medical examination without abnormal findings: Secondary | ICD-10-CM | POA: Diagnosis not present

## 2017-09-15 NOTE — Progress Notes (Signed)
Subjective:   Donna Santos is a 83 y.o. female who presents for Medicare Annual (Subsequent) preventive examination.  Review of Systems:  N/A Cardiac Risk Factors include: advanced age (>53men, >24 women);dyslipidemia;hypertension     Objective:     Vitals: BP (!) 142/60 (BP Location: Left Arm, Patient Position: Sitting, Cuff Size: Normal)   Pulse 64   Temp 98.5 F (36.9 C) (Oral)   Resp 16   Ht 5' (1.524 m)   Wt 150 lb 6.4 oz (68.2 kg)   BMI 29.37 kg/m   Body mass index is 29.37 kg/m.   Tobacco History  Smoking Status  . Former Smoker  . Quit date: 10/04/1952  Smokeless Tobacco  . Never Used     Counseling given: Not Answered   Past Medical History:  Diagnosis Date  . Arteriosclerosis of coronary artery   . Arthritis of shoulder region   . Breast cancer ([place]) 2002   RT MASTECTOMY  . Cataract   . Essential hypertension   . Hypercholesteremia   . Impaired renal function   . Muscle spasms of neck   . Tobacco abuse    Past Surgical History:  Procedure Laterality Date  . CAROTID ENDARTERECTOMY    . CORONARY ARTERY BYPASS GRAFT  2006  . MASS EXCISION Right 08/28/2015   Procedure: EXCISION MASS; remove massive right chest wall;  Surgeon: Florene Glen, MD;  Location: [facility] ORS;  Service: General;  Laterality: Right;  . MASTECTOMY, RADICAL Right 2002   BREAST CA  . TOTAL ABDOMINAL HYSTERECTOMY     Family History  Problem Relation Age of Onset  . Hypertension Father   . Cancer Father   . Breast cancer Neg Hx    History  Sexual Activity  . Sexual activity: Not on file    Outpatient Encounter Prescriptions as of 09/15/2017  Medication Sig  . amLODipine (NORVASC) 5 MG tablet TAKE 1 TABLET BY MOUTH TWICE DAILY  . aspirin EC 81 MG tablet Take 81 mg by mouth daily.  . Calcium Carb-Cholecalciferol (CALCIUM 600+D) 600-800 MG-UNIT TABS Take 1 tablet by mouth daily. Reported on 12/25/2015  . chlorthalidone (HYGROTON) 25 MG tablet TAKE 1 TABLET BY MOUTH  DAILY  . cholecalciferol (VITAMIN D) 1000 UNITS tablet Take 1,000 Units by mouth daily.  Marland Kitchen ELIQUIS 5 MG TABS tablet   . isosorbide mononitrate (IMDUR) 30 MG 24 hr tablet TAKE 1 TABLET(30 MG) BY MOUTH DAILY  . latanoprost (XALATAN) 0.005 % ophthalmic solution INT 1 GTT INTO EACH EYE QHS.  Marland Kitchen losartan (COZAAR) 25 MG tablet TAKE 1 TABLET EVERY DAY  . metoprolol tartrate (LOPRESSOR) 100 MG tablet TAKE 1 TABLET(100 MG) BY MOUTH TWICE DAILY  . nitroGLYCERIN (NITROSTAT) 0.4 MG SL tablet Place 0.4 mg under the tongue every 5 (five) minutes as needed for chest pain.  Marland Kitchen timolol (TIMOPTIC) 0.25 % ophthalmic solution INSTILL ONE DROP INTO BOTH EYES EVERY MORNING.  Marland Kitchen anastrozole (ARIMIDEX) 1 MG tablet Take 1 tablet (1 mg total) by mouth daily. (Patient not taking: Reported on 09/15/2017)  . Potassium Chloride ER 20 MEQ TBCR Take by mouth.   No facility-administered encounter medications on file as of 09/15/2017.     Activities of Daily Living In your present state of health, do you have any difficulty performing the following activities: 09/15/2017  Hearing? N  Vision? N  Difficulty concentrating or making decisions? N  Walking or climbing stairs? N  Dressing or bathing? N  Doing errands, shopping? N  Preparing Food  and eating ? N  Using the Toilet? N  In the past six months, have you accidently leaked urine? N  Do you have problems with loss of bowel control? N  Managing your Medications? N  Managing your Finances? N  Housekeeping or managing your Housekeeping? N  Some recent data might be hidden    Patient Care Team: Glean Hess, MD as PCP - General (Family Medicine) Sol Passer, MD as Referring Physician (Cardiology) Cammie Sickle, MD as Consulting Physician (Internal Medicine) Noreene Filbert, MD as Referring Physician (Radiation Oncology)    Assessment:     Exercise Activities and Dietary recommendations Current Exercise Habits: The patient does not participate in  regular exercise at present, Exercise limited by: None identified  Goals    . Increase water intake          Recommend to increase fluid intake from 2 glasses of water per day to 4 glasses per day    . Prevent Falls          Patient carries a cane when leaving the house.       Fall Risk Fall Risk  09/15/2017 10/26/2016 08/11/2016 08/03/2016 03/29/2016  Falls in the past year? No Yes Yes No No  Number falls in past yr: - 1 1 - -  Injury with Fall? - Yes Yes - -  Risk Factor Category  - - High Fall Risk - -  Risk for fall due to : - History of fall(s) - - -  Follow up - Falls evaluation completed Falls prevention discussed - -   Depression Screen PHQ 2/9 Scores 09/15/2017 10/26/2016 08/11/2016 08/03/2016  PHQ - 2 Score 0 - 0 0  Exception Documentation - Patient refusal - -     Cognitive Function: declined MMSE - Mini Mental State Exam 08/03/2016  Orientation to time 5  Orientation to Place 5  Registration 3  Attention/ Calculation 5  Recall 2  Language- name 2 objects 2        Immunization History  Administered Date(s) Administered  . Pneumococcal Polysaccharide-23 11/30/2004  . Tdap 08/09/2016   Screening Tests Health Maintenance  Topic Date Due  . PNA vac Low Risk Adult (2 of 2 - PCV13) 01/11/2018 (Originally 11/30/2005)  . TETANUS/TDAP  08/09/2026  . DEXA SCAN  Completed      Plan:    I have personally reviewed and addressed the Medicare Annual Wellness questionnaire and have noted the following in the patient's chart:  A. Medical and social history B. Use of alcohol, tobacco or illicit drugs  C. Current medications and supplements D. Functional ability and status E.  Nutritional status F.  Physical activity G. Advance directives H. List of other physicians I.  Hospitalizations, surgeries, and ER visits in previous 12 months J.  [place] such as hearing and vision if needed, cognitive and depression L. Referrals and appointments - none  In  addition, I have reviewed and discussed with patient certain preventive protocols, quality metrics, and best practice recommendations. A written personalized care plan for preventive services as well as general preventive health recommendations were provided to patient.  See attached scanned questionnaire for additional information.   Signed,  Aleatha Borer, LPN Nurse Health Advisor   MD Recommendations: Declined Flu, Prevnar 13 and Shingles vaccines. Screening mammogram ordered today. Pt to call and schedule her exam.

## 2017-09-15 NOTE — Patient Instructions (Signed)
Donna Santos , Thank you for taking time to come for your Medicare Wellness Visit. I appreciate your ongoing commitment to your health goals. Please review the following plan we discussed and let me know if I can assist you in the future.   Screening recommendations/referrals: Colonoscopy: No longer needed Mammogram: Ordered today. Please call [phone] to schedule your mammogram.  Bone Density: Completed 12/06/16 Recommended yearly ophthalmology/optometry visit for glaucoma screening and checkup Recommended yearly dental visit for hygiene and checkup  Vaccinations: Influenza vaccine: Declined. Does not wish to consider having the vaccine now or anytime in the near future Pneumococcal vaccine: Declined. Does not wish to consider having the vaccine now or anytime in the near future Tdap vaccine: Up to date Shingles vaccine: Declined. Does not wish to consider having the vaccine now or anytime in the near future    Advanced directives: Advance directive discussed with you today. I have provided a copy for you to complete at home and have notarized. Once this is complete please bring a copy in to our office so we can scan it into your chart.  Conditions/risks identified: Recommend to increase fluid intake from 2 glasses of water per day to 4 glasses per day; Fall risk prevention discussed  Next appointment: Scheduled to see Dr. Army Melia on 01/11/18 @ 10:30am. Please schedule your annual wellness exam in one year.   Preventive Care 87 Years and Older, Female Preventive care refers to lifestyle choices and visits with your health care provider that can promote health and wellness. What does preventive care include?  A yearly physical exam. This is also called an annual well check.  Dental exams once or twice a year.  Routine eye exams. Ask your health care provider how often you should have your eyes checked.  Personal lifestyle choices, including:  Daily care of your teeth and  gums.  Regular physical activity.  Eating a healthy diet.  Avoiding tobacco and drug use.  Limiting alcohol use.  Practicing safe sex.  Taking low-dose aspirin every day.  Taking vitamin and mineral supplements as recommended by your health care provider. What happens during an annual well check? The services and screenings done by your health care provider during your annual well check will depend on your age, overall health, lifestyle risk factors, and family history of disease. Counseling  Your health care provider may ask you questions about your:  Alcohol use.  Tobacco use.  Drug use.  Emotional well-being.  Home and relationship well-being.  Sexual activity.  Eating habits.  History of falls.  Memory and ability to understand (cognition).  Work and work Statistician.  Reproductive health. Screening  You may have the following tests or measurements:  Height, weight, and BMI.  Blood pressure.  Lipid and cholesterol levels. These may be checked every 5 years, or more frequently if you are over 43 years old.  Skin check.  Lung cancer screening. You may have this screening every year starting at age 39 if you have a 30-pack-year history of smoking and currently smoke or have quit within the past 15 years.  Fecal occult blood test (FOBT) of the stool. You may have this test every year starting at age 63.  Flexible sigmoidoscopy or colonoscopy. You may have a sigmoidoscopy every 5 years or a colonoscopy every 10 years starting at age 11.  Hepatitis C blood test.  Hepatitis B blood test.  Sexually transmitted disease (STD) testing.  Diabetes screening. This is done by checking your blood sugar (glucose)  after you have not eaten for a while (fasting). You may have this done every 1-3 years.  Bone density scan. This is done to screen for osteoporosis. You may have this done starting at age 43.  Mammogram. This may be done every 1-2 years. Talk to your  health care provider about how often you should have regular mammograms. Talk with your health care provider about your test results, treatment options, and if necessary, the need for more tests. Vaccines  Your health care provider may recommend certain vaccines, such as:  Influenza vaccine. This is recommended every year.  Tetanus, diphtheria, and acellular pertussis (Tdap, Td) vaccine. You may need a Td booster every 10 years.  Zoster vaccine. You may need this after age 70.  Pneumococcal 13-valent conjugate (PCV13) vaccine. One dose is recommended after age 26.  Pneumococcal polysaccharide (PPSV23) vaccine. One dose is recommended after age 71. Talk to your health care provider about which screenings and vaccines you need and how often you need them. This information is not intended to replace advice given to you by your health care provider. Make sure you discuss any questions you have with your health care provider. Document Released: 12/12/2015 Document Revised: 08/04/2016 Document Reviewed: 09/16/2015 Elsevier Interactive Patient Education  2017 [place] Prevention in the Home Falls can cause injuries. They can happen to people of all ages. There are many things you can do to make your home safe and to help prevent falls. What can I do on the outside of my home?  Regularly fix the edges of walkways and driveways and fix any cracks.  Remove anything that might make you trip as you walk through a door, such as a raised step or threshold.  Trim any bushes or trees on the path to your home.  Use bright outdoor lighting.  Clear any walking paths of anything that might make someone trip, such as rocks or tools.  Regularly check to see if handrails are loose or broken. Make sure that both sides of any steps have handrails.  Any raised decks and porches should have guardrails on the edges.  Have any leaves, snow, or ice cleared regularly.  Use sand or salt on walking  paths during winter.  Clean up any spills in your garage right away. This includes oil or grease spills. What can I do in the bathroom?  Use night lights.  Install grab bars by the toilet and in the tub and shower. Do not use towel bars as grab bars.  Use non-skid mats or decals in the tub or shower.  If you need to sit down in the shower, use a plastic, non-slip stool.  Keep the floor dry. Clean up any water that spills on the floor as soon as it happens.  Remove soap buildup in the tub or shower regularly.  Attach bath mats securely with double-sided non-slip rug tape.  Do not have throw rugs and other things on the floor that can make you trip. What can I do in the bedroom?  Use night lights.  Make sure that you have a light by your bed that is easy to reach.  Do not use any sheets or blankets that are too big for your bed. They should not hang down onto the floor.  Have a firm chair that has side arms. You can use this for support while you get dressed.  Do not have throw rugs and other things on the floor that can make you  trip. What can I do in the kitchen?  Clean up any spills right away.  Avoid walking on wet floors.  Keep items that you use a lot in easy-to-reach places.  If you need to reach something above you, use a strong step stool that has a grab bar.  Keep electrical cords out of the way.  Do not use floor polish or wax that makes floors slippery. If you must use wax, use non-skid floor wax.  Do not have throw rugs and other things on the floor that can make you trip. What can I do with my stairs?  Do not leave any items on the stairs.  Make sure that there are handrails on both sides of the stairs and use them. Fix handrails that are broken or loose. Make sure that handrails are as long as the stairways.  Check any carpeting to make sure that it is firmly attached to the stairs. Fix any carpet that is loose or worn.  Avoid having throw rugs at  the top or bottom of the stairs. If you do have throw rugs, attach them to the floor with carpet tape.  Make sure that you have a light switch at the top of the stairs and the bottom of the stairs. If you do not have them, ask someone to add them for you. What else can I do to help prevent falls?  Wear shoes that:  Do not have high heels.  Have rubber bottoms.  Are comfortable and fit you well.  Are closed at the toe. Do not wear sandals.  If you use a stepladder:  Make sure that it is fully opened. Do not climb a closed stepladder.  Make sure that both sides of the stepladder are locked into place.  Ask someone to hold it for you, if possible.  Clearly mark and make sure that you can see:  Any grab bars or handrails.  First and last steps.  Where the edge of each step is.  Use tools that help you move around (mobility aids) if they are needed. These include:  Canes.  Walkers.  Scooters.  Crutches.  Turn on the lights when you go into a dark area. Replace any light bulbs as soon as they burn out.  Set up your furniture so you have a clear path. Avoid moving your furniture around.  If any of your floors are uneven, fix them.  If there are any pets around you, be aware of where they are.  Review your medicines with your doctor. Some medicines can make you feel dizzy. This can increase your chance of falling. Ask your doctor what other things that you can do to help prevent falls. This information is not intended to replace advice given to you by your health care provider. Make sure you discuss any questions you have with your health care provider. Document Released: 09/11/2009 Document Revised: 04/22/2016 Document Reviewed: 12/20/2014 Elsevier Interactive Patient Education  2017 [employer].

## 2017-09-20 DIAGNOSIS — H401131 Primary open-angle glaucoma, bilateral, mild stage: Secondary | ICD-10-CM | POA: Diagnosis not present

## 2017-10-17 ENCOUNTER — Ambulatory Visit: Payer: Medicare Other | Admitting: Radiation Oncology

## 2017-10-31 ENCOUNTER — Ambulatory Visit: Payer: Medicare Other | Admitting: Radiation Oncology

## 2017-11-01 DIAGNOSIS — H401131 Primary open-angle glaucoma, bilateral, mild stage: Secondary | ICD-10-CM | POA: Diagnosis not present

## 2017-11-11 ENCOUNTER — Ambulatory Visit: Payer: Medicare Other | Admitting: Radiation Oncology

## 2017-11-14 ENCOUNTER — Other Ambulatory Visit: Payer: Self-pay | Admitting: Internal Medicine

## 2017-11-14 DIAGNOSIS — I2510 Atherosclerotic heart disease of native coronary artery without angina pectoris: Secondary | ICD-10-CM

## 2017-11-15 ENCOUNTER — Other Ambulatory Visit: Payer: Self-pay | Admitting: Internal Medicine

## 2017-12-08 DIAGNOSIS — I4891 Unspecified atrial fibrillation: Secondary | ICD-10-CM | POA: Diagnosis not present

## 2017-12-08 DIAGNOSIS — I779 Disorder of arteries and arterioles, unspecified: Secondary | ICD-10-CM | POA: Diagnosis not present

## 2017-12-08 DIAGNOSIS — H409 Unspecified glaucoma: Secondary | ICD-10-CM | POA: Diagnosis not present

## 2017-12-08 DIAGNOSIS — R1110 Vomiting, unspecified: Secondary | ICD-10-CM | POA: Diagnosis not present

## 2017-12-08 DIAGNOSIS — Z7982 Long term (current) use of aspirin: Secondary | ICD-10-CM | POA: Diagnosis not present

## 2017-12-08 DIAGNOSIS — Z87891 Personal history of nicotine dependence: Secondary | ICD-10-CM | POA: Diagnosis not present

## 2017-12-08 DIAGNOSIS — R791 Abnormal coagulation profile: Secondary | ICD-10-CM | POA: Diagnosis not present

## 2017-12-08 DIAGNOSIS — R0789 Other chest pain: Secondary | ICD-10-CM | POA: Diagnosis not present

## 2017-12-08 DIAGNOSIS — R079 Chest pain, unspecified: Secondary | ICD-10-CM | POA: Diagnosis not present

## 2017-12-08 DIAGNOSIS — Z951 Presence of aortocoronary bypass graft: Secondary | ICD-10-CM | POA: Diagnosis not present

## 2017-12-08 DIAGNOSIS — R55 Syncope and collapse: Secondary | ICD-10-CM | POA: Diagnosis not present

## 2017-12-08 DIAGNOSIS — Z7901 Long term (current) use of anticoagulants: Secondary | ICD-10-CM | POA: Diagnosis not present

## 2017-12-08 DIAGNOSIS — R42 Dizziness and giddiness: Secondary | ICD-10-CM | POA: Diagnosis not present

## 2017-12-08 DIAGNOSIS — Z853 Personal history of malignant neoplasm of breast: Secondary | ICD-10-CM | POA: Diagnosis not present

## 2017-12-08 DIAGNOSIS — Z9114 Patient's other noncompliance with medication regimen: Secondary | ICD-10-CM | POA: Diagnosis not present

## 2017-12-08 DIAGNOSIS — Z888 Allergy status to other drugs, medicaments and biological substances status: Secondary | ICD-10-CM | POA: Diagnosis not present

## 2017-12-08 DIAGNOSIS — I2510 Atherosclerotic heart disease of native coronary artery without angina pectoris: Secondary | ICD-10-CM | POA: Diagnosis not present

## 2017-12-08 DIAGNOSIS — E876 Hypokalemia: Secondary | ICD-10-CM | POA: Diagnosis not present

## 2017-12-08 DIAGNOSIS — I493 Ventricular premature depolarization: Secondary | ICD-10-CM | POA: Diagnosis not present

## 2017-12-08 DIAGNOSIS — Z79811 Long term (current) use of aromatase inhibitors: Secondary | ICD-10-CM | POA: Diagnosis not present

## 2017-12-08 DIAGNOSIS — I481 Persistent atrial fibrillation: Secondary | ICD-10-CM | POA: Diagnosis not present

## 2017-12-08 DIAGNOSIS — Z79899 Other long term (current) drug therapy: Secondary | ICD-10-CM | POA: Diagnosis not present

## 2017-12-08 DIAGNOSIS — I10 Essential (primary) hypertension: Secondary | ICD-10-CM | POA: Diagnosis not present

## 2017-12-08 DIAGNOSIS — R110 Nausea: Secondary | ICD-10-CM | POA: Diagnosis not present

## 2017-12-08 DIAGNOSIS — I160 Hypertensive urgency: Secondary | ICD-10-CM | POA: Diagnosis not present

## 2017-12-08 DIAGNOSIS — R001 Bradycardia, unspecified: Secondary | ICD-10-CM | POA: Diagnosis not present

## 2017-12-09 DIAGNOSIS — E876 Hypokalemia: Secondary | ICD-10-CM | POA: Diagnosis not present

## 2017-12-09 DIAGNOSIS — I160 Hypertensive urgency: Secondary | ICD-10-CM | POA: Diagnosis not present

## 2017-12-09 DIAGNOSIS — R0789 Other chest pain: Secondary | ICD-10-CM | POA: Diagnosis not present

## 2017-12-09 DIAGNOSIS — R079 Chest pain, unspecified: Secondary | ICD-10-CM | POA: Diagnosis not present

## 2017-12-09 DIAGNOSIS — R55 Syncope and collapse: Secondary | ICD-10-CM | POA: Diagnosis not present

## 2017-12-09 MED ORDER — AMLODIPINE BESYLATE 10 MG PO TABS
10.00 mg | ORAL_TABLET | ORAL | Status: DC
Start: 2017-12-11 — End: 2017-12-09

## 2017-12-09 MED ORDER — CHLORTHALIDONE 25 MG PO TABS
25.00 mg | ORAL_TABLET | ORAL | Status: DC
Start: 2017-12-11 — End: 2017-12-09

## 2017-12-09 MED ORDER — LOSARTAN POTASSIUM 25 MG PO TABS
25.00 mg | ORAL_TABLET | ORAL | Status: DC
Start: 2017-12-09 — End: 2017-12-09

## 2017-12-09 MED ORDER — TIMOLOL MALEATE 0.25 % OP SOLN
1.00 | OPHTHALMIC | Status: DC
Start: 2017-12-11 — End: 2017-12-09

## 2017-12-09 MED ORDER — LOSARTAN POTASSIUM 25 MG PO TABS
50.00 mg | ORAL_TABLET | ORAL | Status: DC
Start: 2017-12-11 — End: 2017-12-09

## 2017-12-09 MED ORDER — APIXABAN 5 MG PO TABS
5.00 mg | ORAL_TABLET | ORAL | Status: DC
Start: 2017-12-10 — End: 2017-12-09

## 2017-12-09 MED ORDER — ASPIRIN 81 MG PO CHEW
81.00 mg | CHEWABLE_TABLET | ORAL | Status: DC
Start: 2017-12-11 — End: 2017-12-09

## 2017-12-09 MED ORDER — METOPROLOL TARTRATE 50 MG PO TABS
100.00 mg | ORAL_TABLET | ORAL | Status: DC
Start: 2017-12-09 — End: 2017-12-09

## 2017-12-09 MED ORDER — NITROGLYCERIN 0.4 MG SL SUBL
0.40 mg | SUBLINGUAL_TABLET | SUBLINGUAL | Status: DC
Start: ? — End: 2017-12-09

## 2017-12-09 MED ORDER — ISOSORBIDE MONONITRATE ER 30 MG PO TB24
30.00 mg | ORAL_TABLET | ORAL | Status: DC
Start: 2017-12-09 — End: 2017-12-09

## 2017-12-09 MED ORDER — ISOSORBIDE MONONITRATE ER 30 MG PO TB24
60.00 mg | ORAL_TABLET | ORAL | Status: DC
Start: 2017-12-10 — End: 2017-12-09

## 2017-12-09 MED ORDER — GENERIC EXTERNAL MEDICATION
1.00 | Status: DC
Start: 2017-12-10 — End: 2017-12-09

## 2017-12-10 DIAGNOSIS — I10 Essential (primary) hypertension: Secondary | ICD-10-CM | POA: Diagnosis not present

## 2017-12-10 DIAGNOSIS — I2510 Atherosclerotic heart disease of native coronary artery without angina pectoris: Secondary | ICD-10-CM | POA: Diagnosis not present

## 2017-12-10 DIAGNOSIS — R0789 Other chest pain: Secondary | ICD-10-CM | POA: Diagnosis not present

## 2017-12-10 DIAGNOSIS — R079 Chest pain, unspecified: Secondary | ICD-10-CM | POA: Diagnosis not present

## 2017-12-10 MED ORDER — METOPROLOL SUCCINATE ER 25 MG PO TB24
50.00 mg | ORAL_TABLET | ORAL | Status: DC
Start: 2017-12-10 — End: 2017-12-10

## 2017-12-10 MED ORDER — ISOSORBIDE MONONITRATE ER 30 MG PO TB24
30.00 mg | ORAL_TABLET | ORAL | Status: DC
Start: 2017-12-11 — End: 2017-12-10

## 2017-12-13 LAB — TSH: TSH: 2.65 (ref 0.41–5.90)

## 2017-12-13 LAB — BASIC METABOLIC PANEL
BUN: 18 (ref 4–21)
Creatinine: 9 — AB (ref 0.5–1.1)

## 2017-12-16 ENCOUNTER — Ambulatory Visit: Payer: Medicare Other | Admitting: Internal Medicine

## 2017-12-16 ENCOUNTER — Telehealth: Payer: Self-pay

## 2017-12-16 ENCOUNTER — Encounter: Payer: Self-pay | Admitting: Internal Medicine

## 2017-12-16 VITALS — BP 122/60 | HR 77 | Ht 60.0 in | Wt 140.0 lb

## 2017-12-16 DIAGNOSIS — Z853 Personal history of malignant neoplasm of breast: Secondary | ICD-10-CM

## 2017-12-16 DIAGNOSIS — F0390 Unspecified dementia without behavioral disturbance: Secondary | ICD-10-CM

## 2017-12-16 DIAGNOSIS — I481 Persistent atrial fibrillation: Secondary | ICD-10-CM

## 2017-12-16 DIAGNOSIS — I10 Essential (primary) hypertension: Secondary | ICD-10-CM

## 2017-12-16 DIAGNOSIS — I2510 Atherosclerotic heart disease of native coronary artery without angina pectoris: Secondary | ICD-10-CM

## 2017-12-16 DIAGNOSIS — I4819 Other persistent atrial fibrillation: Secondary | ICD-10-CM

## 2017-12-16 MED ORDER — LOSARTAN POTASSIUM 25 MG PO TABS: 25.0000 mg | ORAL_TABLET | Freq: Every day | ORAL | 12 refills | Status: DC

## 2017-12-16 NOTE — Patient Instructions (Addendum)
Need to discuss Arimidex with Dr. Rogue Bussing at appt next week  Discuss refill on Nitro-stat with cardiologist.

## 2017-12-16 NOTE — Progress Notes (Signed)
Date:  12/16/2017   Name:  Donna Santos   DOB:  06-Jun-1925   MRN:  [id number]   Chief Complaint: Hospitalization Follow-up (High BP follow up from hospital stay with chest pain while there. Changed metoprolol to 50 mg since she passed out at the hospital. Also, needs new RX for nitroglycerin. The old SL tablets have expired. )  Admitted to [facility]  12/08/17 - 12/10/17.  Received a TOC call on 12/13/17.  Hospital Course: [name] a 84 y.o.femalewith PMHx of CAD s/p CABG, A-fib, and HTN that initially presented to [facility] with Chest pain and elevated blood pressure. She admits to occasionally missing doses of her home BP medications. BP was elevated into 175Z systolic on presentation. She was monitored on telemetry and troponin was cycled, there was no sign of ACS. Her home medications were restarted with improvement in her pressures and she had no further chest pain. Patient was not interested in invasive testing such as cardiac cath, so stress testing was not felt to be indicated. We decided to continue with medical management of her known CAD.   She reports doing well. She has been staying with her son daughter in law but is eager to get home. She has had no further chest pain, sob or syncope.  [place] visited once.  She denies bleeding. She requests refill on Nitrostat.  Breast cancer - has been followed by Oncology.  She is supposed to be on Arimidex but stopped it over 18 mo ago - no reason given.  She will discuss with oncology at appt next week.    Daughter in law is concerned about memory.  She has noticed that Donna Santos will forget certain appointments and then be upset that she did not remember.  Donna Santos is still driving without problems.  Her family calls to be sure that she takes her medication daily.      Review of Systems  Constitutional: Negative for chills, fatigue and fever.  Respiratory: Negative for chest tightness and shortness of breath.   Cardiovascular: Negative  for chest pain, palpitations and leg swelling.  Gastrointestinal: Negative for abdominal pain, constipation, diarrhea, nausea and vomiting.  Genitourinary: Negative for difficulty urinating.  Musculoskeletal: Positive for back pain.  Neurological: Negative for dizziness, tremors and headaches.  Psychiatric/Behavioral: Negative for sleep disturbance.    Patient Active Problem List   Diagnosis Date Noted  . Persistent atrial fibrillation ([place]) 12/28/2016  . Carcinoma of overlapping sites of right breast in female, estrogen receptor positive ([place]) 12/21/2016  . Arthritis of right knee 08/03/2016  . Senile dementia, uncomplicated 02/58/5277  . Low back pain 04/12/2016  . Impaired renal function 05/09/2015  . Arteriosclerosis of coronary artery 05/09/2015  . Essential (primary) hypertension 05/09/2015  . Personal history of malignant neoplasm of breast 05/09/2015  . Muscle spasms of neck 05/09/2015  . Arthritis of shoulder region, degenerative 05/09/2015  . Current tobacco use 05/09/2015  . Hypercholesteremia 04/20/2012  . Carotid artery disease ([place]) 05/28/2005    Prior to Admission medications   Medication Sig Start Date End Date Taking? Authorizing Provider  amLODipine (NORVASC) 5 MG tablet TAKE 1 TABLET BY MOUTH TWICE DAILY 11/15/17  Yes Glean Hess, MD  aspirin EC 81 MG tablet Take 81 mg by mouth daily.   Yes [provider]  Calcium Carb-Cholecalciferol (CALCIUM 600+D) 600-800 MG-UNIT TABS Take 1 tablet by mouth daily. Reported on 12/25/2015   Yes [provider]  chlorthalidone (HYGROTON) 25 MG tablet TAKE 1  TABLET BY MOUTH DAILY 03/25/17  Yes Glean Hess, MD  ELIQUIS 5 MG TABS tablet  12/28/16  Yes [provider]  ibuprofen (ADVIL,MOTRIN) 200 MG tablet Take 200 mg by mouth every 6 (six) hours as needed.   Yes [provider]  isosorbide mononitrate (IMDUR) 30 MG 24 hr tablet TAKE 1 TABLET(30 MG) BY MOUTH DAILY 11/14/17  Yes Glean Hess, MD  latanoprost (XALATAN) 0.005 % ophthalmic solution INT 1 GTT INTO EACH EYE QHS.   Yes [provider]  losartan (COZAAR) 25 MG tablet TAKE 1 TABLET EVERY DAY 09/14/17  Yes Glean Hess, MD  metoprolol succinate (TOPROL-XL) 50 MG 24 hr tablet Take 50 mg by mouth daily. Take with or immediately following a meal.   Yes [provider]  Netarsudil Dimesylate (RHOPRESSA) 0.02 % SOLN Apply to eye.   Yes [provider]  nitroGLYCERIN (NITROSTAT) 0.4 MG SL tablet Place 0.4 mg under the tongue every 5 (five) minutes as needed for chest pain.   Yes [provider]  timolol (TIMOPTIC) 0.25 % ophthalmic solution INSTILL ONE DROP INTO BOTH EYES EVERY MORNING.   Yes [provider]  anastrozole (ARIMIDEX) 1 MG tablet Take 1 tablet (1 mg total) by mouth daily. Patient not taking: Reported on 09/15/2017 09/09/15   Cammie Sickle, MD    Allergies  Allergen Reactions  . Ace Inhibitors     Other reaction(s): Angioedema  . Brinzolamide-Brimonidine     Past Surgical History:  Procedure Laterality Date  . CAROTID ENDARTERECTOMY    . CORONARY ARTERY BYPASS GRAFT  2006  . MASS EXCISION Right 08/28/2015   Procedure: EXCISION MASS; remove massive right chest wall;  Surgeon: Florene Glen, MD;  Location: [facility] ORS;  Service: General;  Laterality: Right;  . MASTECTOMY, RADICAL Right 2002   BREAST CA  . TOTAL ABDOMINAL HYSTERECTOMY      Social History   Tobacco Use  . Smoking status: Former Smoker    Last attempt to quit: 10/04/1952    Years since quitting: 65.2  . Smokeless tobacco: Never Used  Substance Use Topics  . Alcohol use: No    Alcohol/week: 0.0 oz  . Drug use: No     Medication list has been reviewed and updated.  PHQ 2/9 Scores 09/15/2017 10/26/2016 08/11/2016 08/03/2016  PHQ - 2 Score 0 - 0 0  Exception Documentation - Patient refusal - -    Physical Exam  Constitutional: She is oriented to person, place, and time.  She appears well-developed. No distress.  HENT:  Head: Normocephalic and atraumatic.  Neck: Normal range of motion. Neck supple.  Cardiovascular: Normal rate and normal heart sounds. An irregularly irregular rhythm present.  Pulmonary/Chest: Effort normal and breath sounds normal. No respiratory distress. She has no wheezes. She has no rales.  Abdominal: She exhibits no distension.  Musculoskeletal: She exhibits edema. She exhibits no tenderness.  Neurological: She is alert and oriented to person, place, and time.  Skin: Skin is warm and dry. No rash noted.  Psychiatric: She has a normal mood and affect. Her speech is normal and behavior is normal. Thought content normal. Her mood appears not anxious. Cognition and memory are not impaired (mild forgetfullness but able to answer most questions correctly with regard to orientation and recent events). She does not exhibit a depressed mood.  Nursing note and vitals reviewed.   BP 122/60 (BP Location: Left Arm, Patient Position: Sitting, Cuff Size: Normal)   Pulse 77  Ht 5' (1.524 m)   Wt 140 lb (63.5 kg)   SpO2 97%   BMI 27.34 kg/m   Assessment and Plan: 1. Arteriosclerosis of coronary artery Stable Ask cardiology for nitroglycerin refill  2. Essential (primary) hypertension Improved control  3. Persistent atrial fibrillation (HCC) Continue Eliquis  4. Personal history of malignant neoplasm of breast Ask Oncology about restarting Arimidex  5. Senile dementia, uncomplicated Discussed with patient getting Neurology evaluation, esp if considering living on her own and driving Family will discuss and call back if needed  Meds ordered this encounter  Medications  . losartan (COZAAR) 25 MG tablet    Sig: Take 1 tablet (25 mg total) by mouth daily.    Dispense:  30 tablet    Refill:  12    Partially dictated using Editor, commissioning. Any errors are unintentional.  Halina Maidens, MD [facility]  12/16/2017

## 2017-12-16 NOTE — Telephone Encounter (Signed)
Patient's daughter Adriauna Campton called to inform Dr Army Melia patient is coming in this afternoon. Wanted to inform Dr Army Melia that patient is showing signs of possible Dementia or Alzheimer disease. Called Sarah back and unable to reach her. Left a VM to inform her I received her message and informed Dr Army Melia of her concerns.

## 2017-12-20 ENCOUNTER — Inpatient Hospital Stay: Payer: Medicare Other | Attending: Internal Medicine | Admitting: Internal Medicine

## 2017-12-20 ENCOUNTER — Encounter: Payer: Self-pay | Admitting: Internal Medicine

## 2017-12-20 ENCOUNTER — Inpatient Hospital Stay: Payer: Medicare Other

## 2017-12-20 VITALS — BP 135/54 | HR 70 | Temp 98.2°F | Wt 149.8 lb

## 2017-12-20 DIAGNOSIS — C44501 Unspecified malignant neoplasm of skin of breast: Secondary | ICD-10-CM | POA: Diagnosis not present

## 2017-12-20 DIAGNOSIS — Z170 Estrogen receptor positive status [ER+]: Secondary | ICD-10-CM

## 2017-12-20 DIAGNOSIS — Z87891 Personal history of nicotine dependence: Secondary | ICD-10-CM | POA: Diagnosis not present

## 2017-12-20 DIAGNOSIS — M810 Age-related osteoporosis without current pathological fracture: Secondary | ICD-10-CM | POA: Diagnosis not present

## 2017-12-20 DIAGNOSIS — I10 Essential (primary) hypertension: Secondary | ICD-10-CM

## 2017-12-20 DIAGNOSIS — I4891 Unspecified atrial fibrillation: Secondary | ICD-10-CM

## 2017-12-20 DIAGNOSIS — C50811 Malignant neoplasm of overlapping sites of right female breast: Secondary | ICD-10-CM

## 2017-12-20 DIAGNOSIS — E876 Hypokalemia: Secondary | ICD-10-CM

## 2017-12-20 LAB — COMPREHENSIVE METABOLIC PANEL
ALT: 11 U/L — AB (ref 14–54)
AST: 19 U/L (ref 15–41)
Albumin: 3.7 g/dL (ref 3.5–5.0)
Alkaline Phosphatase: 52 U/L (ref 38–126)
Anion gap: 12 (ref 5–15)
BUN: 16 mg/dL (ref 6–20)
CHLORIDE: 95 mmol/L — AB (ref 101–111)
CO2: 26 mmol/L (ref 22–32)
CREATININE: 0.92 mg/dL (ref 0.44–1.00)
Calcium: 9.3 mg/dL (ref 8.9–10.3)
GFR calc non Af Amer: 53 mL/min — ABNORMAL LOW (ref >60)
Glucose, Bld: 155 mg/dL — ABNORMAL HIGH (ref 65–99)
Potassium: 3 mmol/L — ABNORMAL LOW (ref 3.5–5.1)
SODIUM: 133 mmol/L — AB (ref 135–145)
Total Bilirubin: 0.6 mg/dL (ref 0.3–1.2)
Total Protein: 7.4 g/dL (ref 6.5–8.1)

## 2017-12-20 LAB — CBC WITH DIFFERENTIAL/PLATELET
Basophils Absolute: 0.1 10*3/uL (ref 0–0.1)
Basophils Relative: 1 %
EOS ABS: 0.1 10*3/uL (ref 0–0.7)
Eosinophils Relative: 2 %
HCT: 38.4 % (ref 35.0–47.0)
HEMOGLOBIN: 13.5 g/dL (ref 12.0–16.0)
LYMPHS ABS: 0.9 10*3/uL — AB (ref 1.0–3.6)
LYMPHS PCT: 11 %
MCH: 31.6 pg (ref 26.0–34.0)
MCHC: 35.1 g/dL (ref 32.0–36.0)
MCV: 90 fL (ref 80.0–100.0)
MONOS PCT: 9 %
Monocytes Absolute: 0.7 10*3/uL (ref 0.2–0.9)
NEUTROS PCT: 77 %
Neutro Abs: 6.3 10*3/uL (ref 1.4–6.5)
Platelets: 396 10*3/uL (ref 150–440)
RBC: 4.26 MIL/uL (ref 3.80–5.20)
RDW: 13.8 % (ref 11.5–14.5)
WBC: 8 10*3/uL (ref 3.6–11.0)

## 2017-12-20 MED ORDER — ANASTROZOLE 1 MG PO TABS: 1.0000 mg | ORAL_TABLET | Freq: Every day | ORAL | 4 refills | Status: DC

## 2017-12-20 NOTE — Assessment & Plan Note (Addendum)
#  RIGHT IPSILATERAL CHEST WALL RECURRENCE STATUS post mastectomy 2002- Cribiform type; ER/PR >90%; Her2 Neu-NEG. Status post radiation. Complete response noted.   # No masses felt on the chest wall [? Scar tissue felt- monitor for now].  ? Compliance; reminded to start back on Arimidex. New script sent.   # Back pain- ? Etiology; high risk of compression fractures; recommend Tylenol prn; if not improved- recommend X-rays.   # Elevated Blood pressure- better controlled now.   # Patient has osteoporosis. BMD- jan 2018: -2.7. Long discussion regarding the risk factors ; potential treatment options of bisphosphonates including oral ; and injectables. Declined; recommend continue Ca+ vit D BID.   # A.fib on Eliquis - no falls.   # hypokalemia- 3.0 sec to chlorthalidone; recommend dietary supp. Pt reluctant.   #Follow-up in 6 months labs.  Again stressed compliance with her medications with the family.

## 2017-12-20 NOTE — Progress Notes (Signed)
[facility] OFFICE PROGRESS NOTE  Patient Care Team: Glean Hess, MD as PCP - General (Family Medicine) Sol Passer, MD as Referring Physician (Cardiology) Cammie Sickle, MD as Consulting Physician (Internal Medicine) Noreene Filbert, MD as Referring Physician (Radiation Oncology)   SUMMARY OF ONCOLOGIC HISTORY:  Oncology History   # 2002- Right Breast" Predominant DCIS- cribriform/low grade; 1.5cm with 5% early invasion"; ER 74%; PR-NEG; her 2 Neg; S/P Mastec & ALND [no residual ca/dcis] & Recon]; NO TAM  # 2016- CHEST WALL RECURRENCE [~2.5CM] s/p RIGHT excisional Biopsy; positive deep margin [ Dr.Cooper] Cribriform type; ER/PR >90%; HER 2 neu-NEG; s/p RT; Arimidex  # Lung nodule [14m LL; Oct 2016]; Left kidney ? 2.8cm Complex Cyst- [place]-NEG for mass; Pancreas 10 mm cyst  # OSTEOPOROSIS [BMD, OCT 2016] ca+ vit D ; declined Prolia     Carcinoma of overlapping sites of right breast in female, estrogen receptor positive ([place]     INTERVAL HISTORY:  Donna Santos 84y.o. female patient isolated chest wall recurrence/not resectable ER/PR positive HER-2/neu negative  Currently status post radiation finished in of December 2016; on Arimidex is here for follow-up.  [facility].  She is currently [facility].  Blood pressures are better controlled.  As per the family, while in the hospital they realize that patient had not been taking her anastrozole.  They not sure how long she has not been on anastrozole.  Patient denies any new lumps or bumps.  Patient denies any hot flashes. Denies any new joint pains or muscle pain. Denies any lumps or bumps. Patient denies any symptoms at this time.  REVIEW OF SYSTEMS:  A complete 10 point review of system is done which is negative except mentioned above/history of present illness.   PAST MEDICAL HISTORY :  Past Medical History:   Diagnosis Date  . Arteriosclerosis of coronary artery   . Arthritis of shoulder region   . [place]   RT MASTECTOMY  . Cataract   . Essential hypertension   . Hypercholesteremia   . Impaired renal function   . Muscle spasms of neck   . Tobacco abuse     PAST SURGICAL HISTORY :   Past Surgical History:  Procedure Laterality Date  . CAROTID ENDARTERECTOMY    . CORONARY ARTERY BYPASS GRAFT  2006  . MASS EXCISION Right 08/28/2015   Procedure: EXCISION MASS; remove massive right chest wall;  Surgeon: RFlorene Glen MD;  Location: [facility] ORS;  Service: General;  Laterality: Right;  . MASTECTOMY, RADICAL Right 2002   BREAST CA  . TOTAL ABDOMINAL HYSTERECTOMY      FAMILY HISTORY :   Family History  Problem Relation Age of Onset  . Hypertension Father   . Cancer Father   . Breast cancer Neg Hx     SOCIAL HISTORY:   Social History   Tobacco Use  . Smoking status: Former Smoker    Last attempt to quit: 10/04/1952    Years since quitting: 65.2  . Smokeless tobacco: Never Used  Substance Use Topics  . Alcohol use: No    Alcohol/week: 0.0 oz  . Drug use: No    ALLERGIES:  is allergic to ace inhibitors and brinzolamide-brimonidine.  MEDICATIONS:  Current Outpatient Medications  Medication Sig Dispense Refill  . amLODipine (NORVASC) 5 MG tablet TAKE 1 TABLET BY MOUTH TWICE DAILY 60 tablet 5  .  aspirin EC 81 MG tablet Take 81 mg by mouth daily.    . Calcium Carb-Cholecalciferol (CALCIUM 600+D) 600-800 MG-UNIT TABS Take 1 tablet by mouth daily. Reported on 12/25/2015    . chlorthalidone (HYGROTON) 25 MG tablet TAKE 1 TABLET BY MOUTH DAILY 30 tablet 12  . ELIQUIS 5 MG TABS tablet     . ibuprofen (ADVIL,MOTRIN) 200 MG tablet Take 200 mg by mouth every 6 (six) hours as needed.    . isosorbide mononitrate (IMDUR) 30 MG 24 hr tablet TAKE 1 TABLET(30 MG) BY MOUTH DAILY 30 tablet 5  . latanoprost (XALATAN) 0.005 % ophthalmic solution INT 1 GTT INTO EACH EYE QHS.  6   . losartan (COZAAR) 25 MG tablet Take 1 tablet (25 mg total) by mouth daily. 30 tablet 12  . metoprolol succinate (TOPROL-XL) 50 MG 24 hr tablet Take 50 mg by mouth daily. Take with or immediately following a meal.    . nitroGLYCERIN (NITROSTAT) 0.4 MG SL tablet Place 0.4 mg under the tongue every 5 (five) minutes as needed for chest pain.    Marland Kitchen timolol (TIMOPTIC) 0.25 % ophthalmic solution INSTILL ONE DROP INTO BOTH EYES EVERY MORNING.  6  . anastrozole (ARIMIDEX) 1 MG tablet Take 1 tablet (1 mg total) by mouth daily. 90 tablet 4  . Netarsudil Dimesylate (RHOPRESSA) 0.02 % SOLN Apply to eye.     No current facility-administered medications for this visit.     PHYSICAL EXAMINATION: ECOG PERFORMANCE STATUS: 0 - Asymptomatic  BP (!) 135/54 (BP Location: Left Arm)   Pulse 70   Temp 98.2 F (36.8 C) (Oral)   Wt 149 lb 12.8 oz (67.9 kg)   SpO2 99%   BMI 29.26 kg/m   Filed Weights   12/20/17 1129  Weight: 149 lb 12.8 oz (67.9 kg)    GENERAL: Well-nourished well-developed; Alert, no distress and comfortable.   Accompanied by her daughter. She is walking herself. EYES: no pallor or icterus OROPHARYNX: no thrush or ulceration; good dentition  NECK: supple, no masses felt LYMPH:  no palpable lymphadenopathy in the cervical, axillary or inguinal regions LUNGS: clear to auscultation and  No wheeze or crackles HEART/CVS: regular rate & rhythm and no murmurs; No lower extremity edema ABDOMEN:abdomen soft, non-tender and normal bowel sounds Musculoskeletal:no cyanosis of digits and no clubbing  PSYCH: alert & oriented x 3 with fluent speech NEURO: no focal motor/sensory deficits SKIN:  no rashes or significant lesions; chest wall- no masses felt.   LABORATORY DATA:  I have reviewed the data as listed    Component Value Date/Time   NA 133 (L) 12/20/2017 1056   NA 139 01/04/2017 1300   K 3.0 (L) 12/20/2017 1056   CL 95 (L) 12/20/2017 1056   CO2 26 12/20/2017 1056   GLUCOSE 155 (H)  12/20/2017 1056   BUN 16 12/20/2017 1056   BUN 20 01/04/2017 1300   CREATININE 0.92 12/20/2017 1056   CALCIUM 9.3 12/20/2017 1056   PROT 7.4 12/20/2017 1056   PROT 7.6 08/03/2016 1153   ALBUMIN 3.7 12/20/2017 1056   ALBUMIN 4.5 08/03/2016 1153   AST 19 12/20/2017 1056   ALT 11 (L) 12/20/2017 1056   ALKPHOS 52 12/20/2017 1056   BILITOT 0.6 12/20/2017 1056   BILITOT 0.4 08/03/2016 1153   GFRNONAA 53 (L) 12/20/2017 1056   GFRAA >60 12/20/2017 1056    No results found for: SPEP, UPEP  Lab Results  Component Value Date   WBC 8.0 12/20/2017   NEUTROABS  6.3 12/20/2017   HGB 13.5 12/20/2017   HCT 38.4 12/20/2017   MCV 90.0 12/20/2017   PLT 396 12/20/2017      Chemistry      Component Value Date/Time   NA 133 (L) 12/20/2017 1056   NA 139 01/04/2017 1300   K 3.0 (L) 12/20/2017 1056   CL 95 (L) 12/20/2017 1056   CO2 26 12/20/2017 1056   BUN 16 12/20/2017 1056   BUN 20 01/04/2017 1300   CREATININE 0.92 12/20/2017 1056      Component Value Date/Time   CALCIUM 9.3 12/20/2017 1056   ALKPHOS 52 12/20/2017 1056   AST 19 12/20/2017 1056   ALT 11 (L) 12/20/2017 1056   BILITOT 0.6 12/20/2017 1056   BILITOT 0.4 08/03/2016 1153     ASSESSMENT: The BMD measured at Femur Neck Right is 0.664 g/cm2 with a T-score of -2.7.    ASSESSMENT & PLAN:   Carcinoma of overlapping sites of right breast in female, estrogen receptor positive ([place]) # RIGHT IPSILATERAL CHEST WALL RECURRENCE STATUS post mastectomy 2002- Cribiform type; ER/PR >90%; Her2 Neu-NEG. Status post radiation. Complete response noted.   # No masses felt on the chest wall [? Scar tissue felt- monitor for now].  ? Compliance; reminded to start back on Arimidex. New script sent.   # Back pain- ? Etiology; high risk of compression fractures; recommend Tylenol prn; if not improved- recommend X-rays.   # Elevated Blood pressure- better controlled now.   # Patient has osteoporosis. BMD- jan 2018: -2.7. Long discussion  regarding the risk factors ; potential treatment options of bisphosphonates including oral ; and injectables. Declined; recommend continue Ca+ vit D BID.   # A.fib on Eliquis - no falls.   # hypokalemia- 3.0 sec to chlorthalidone; recommend dietary supp. Pt reluctant.   #Follow-up in 6 months labs.  Again stressed compliance with her medications with the family.        Cammie Sickle, MD 12/20/2017 11:56 AM

## 2017-12-21 ENCOUNTER — Telehealth: Payer: Self-pay

## 2017-12-21 NOTE — Telephone Encounter (Signed)
Patient's daughter in law- Terrica Duecker came into the office yesterday to drop off a letter for Dr Army Melia. Dr. Army Melia read over the letter- stated if she is concerned of her memory then we need to refer the patient to neurology. Spoke with Ms. Clarise Cruz- She stated she would like to speak with the patient and her husband (the patients son) before deciding to do referral. Will call by the end of this week to let us know there decision.

## 2017-12-22 DIAGNOSIS — I25118 Atherosclerotic heart disease of native coronary artery with other forms of angina pectoris: Secondary | ICD-10-CM | POA: Diagnosis not present

## 2017-12-22 DIAGNOSIS — I208 Other forms of angina pectoris: Secondary | ICD-10-CM | POA: Diagnosis not present

## 2017-12-22 DIAGNOSIS — I10 Essential (primary) hypertension: Secondary | ICD-10-CM | POA: Diagnosis not present

## 2017-12-23 ENCOUNTER — Other Ambulatory Visit: Payer: Self-pay | Admitting: Internal Medicine

## 2017-12-23 DIAGNOSIS — F0390 Unspecified dementia without behavioral disturbance: Secondary | ICD-10-CM

## 2018-01-11 ENCOUNTER — Encounter: Payer: Self-pay | Admitting: Internal Medicine

## 2018-01-19 DIAGNOSIS — R413 Other amnesia: Secondary | ICD-10-CM | POA: Diagnosis not present

## 2018-02-08 ENCOUNTER — Other Ambulatory Visit: Payer: Self-pay | Admitting: Internal Medicine

## 2018-02-28 ENCOUNTER — Encounter: Payer: Self-pay | Admitting: Internal Medicine

## 2018-02-28 ENCOUNTER — Ambulatory Visit (INDEPENDENT_AMBULATORY_CARE_PROVIDER_SITE_OTHER): Payer: Medicare Other | Admitting: Internal Medicine

## 2018-02-28 VITALS — BP 142/74 | HR 67 | Ht 60.0 in | Wt 147.0 lb

## 2018-02-28 DIAGNOSIS — F0390 Unspecified dementia without behavioral disturbance: Secondary | ICD-10-CM | POA: Diagnosis not present

## 2018-02-28 DIAGNOSIS — N289 Disorder of kidney and ureter, unspecified: Secondary | ICD-10-CM

## 2018-02-28 DIAGNOSIS — Z0001 Encounter for general adult medical examination with abnormal findings: Secondary | ICD-10-CM

## 2018-02-28 DIAGNOSIS — I2510 Atherosclerotic heart disease of native coronary artery without angina pectoris: Secondary | ICD-10-CM | POA: Diagnosis not present

## 2018-02-28 DIAGNOSIS — I4819 Other persistent atrial fibrillation: Secondary | ICD-10-CM

## 2018-02-28 DIAGNOSIS — I481 Persistent atrial fibrillation: Secondary | ICD-10-CM | POA: Diagnosis not present

## 2018-02-28 DIAGNOSIS — I10 Essential (primary) hypertension: Secondary | ICD-10-CM

## 2018-02-28 DIAGNOSIS — C50811 Malignant neoplasm of overlapping sites of right female breast: Secondary | ICD-10-CM

## 2018-02-28 DIAGNOSIS — Z170 Estrogen receptor positive status [ER+]: Secondary | ICD-10-CM | POA: Diagnosis not present

## 2018-02-28 DIAGNOSIS — Z0000 Encounter for general adult medical examination without abnormal findings: Secondary | ICD-10-CM

## 2018-02-28 LAB — POC URINALYSIS WITH MICROSCOPIC (NON AUTO)MANUAL RESULT
Bilirubin, UA: NEGATIVE
CRYSTALS: 0
Epithelial cells, urine per micros: 5
Glucose, UA: NEGATIVE
Ketones, UA: NEGATIVE
MUCUS UA: 0
Nitrite, UA: NEGATIVE
PH UA: 7 (ref 5.0–8.0)
PROTEIN UA: NEGATIVE
RBC UA: NEGATIVE
RBC: 0 M/uL — AB (ref 4.04–5.48)
Spec Grav, UA: 1.01 (ref 1.010–1.025)
UROBILINOGEN UA: 0.2 U/dL
WBC CASTS UA: 2

## 2018-02-28 NOTE — Progress Notes (Signed)
Date:  02/28/2018   Name:  Donna Santos   DOB:  October 08, 1925   MRN:  [id number]   Chief Complaint: Annual Exam (Breast Exam. ) Donna Santos is a 84 y.o. female who presents today for her Complete Annual Exam. She feels fairly well. She reports exercising none. She reports she is sleeping well. She is minimally active.  Still manages her medication for the most part - puts them in a pill box and her son checks behind her.  Hypertension  This is a chronic problem. The problem is controlled. Pertinent negatives include no chest pain, headaches, palpitations or shortness of breath. Past treatments include calcium channel blockers, beta blockers and angiotensin blockers. The current treatment provides significant improvement.  Hyperlipidemia  This is a chronic problem. The problem is uncontrolled. Pertinent negatives include no chest pain or shortness of breath. She is currently on no antihyperlipidemic treatment.   Memory concerns - seen by Neurology for memory loss.  General recommendations made.  No medication started but Namenda mentioned.  CAS/CAD - followed by cardiology.  Not on statin due to patient preference and age. On DOAC for Atrial fibrillation.  Breast Cancer - followed by Oncology.  On Arimidex.  She denies breast mass or skin change.  Review of Systems  Constitutional: Negative for chills, fatigue and fever.  HENT: Negative for congestion, hearing loss, tinnitus, trouble swallowing and voice change.   Eyes: Negative for visual disturbance.  Respiratory: Negative for cough, chest tightness, shortness of breath and wheezing.   Cardiovascular: Negative for chest pain, palpitations and leg swelling.  Gastrointestinal: Negative for abdominal pain, constipation, diarrhea and vomiting.  Endocrine: Negative for polydipsia and polyuria.  Genitourinary: Negative for dysuria, frequency, genital sores, vaginal bleeding and vaginal discharge.  Musculoskeletal: Negative for  arthralgias, gait problem and joint swelling.  Skin: Negative for color change and rash.  Neurological: Negative for dizziness, tremors, light-headedness and headaches.  Hematological: Negative for adenopathy. Does not bruise/bleed easily.  Psychiatric/Behavioral: Negative for dysphoric mood and sleep disturbance. The patient is not nervous/anxious.     Patient Active Problem List   Diagnosis Date Noted  . Persistent atrial fibrillation ([place]) 12/28/2016  . Carcinoma of overlapping sites of right breast in female, estrogen receptor positive ([place]) 12/21/2016  . Arthritis of right knee 08/03/2016  . Senile dementia, uncomplicated 02/58/5277  . Low back pain 04/12/2016  . Impaired renal function 05/09/2015  . Arteriosclerosis of coronary artery 05/09/2015  . Essential (primary) hypertension 05/09/2015  . Personal history of malignant neoplasm of breast 05/09/2015  . Muscle spasms of neck 05/09/2015  . Arthritis of shoulder region, degenerative 05/09/2015  . Hypercholesteremia 04/20/2012  . Carotid artery disease ([place]) 05/28/2005    Prior to Admission medications   Medication Sig Start Date End Date Taking? Authorizing Provider  amLODipine (NORVASC) 5 MG tablet TAKE 1 TABLET BY MOUTH TWICE DAILY 11/15/17   Glean Hess, MD  anastrozole (ARIMIDEX) 1 MG tablet Take 1 tablet (1 mg total) by mouth daily. 12/20/17   Cammie Sickle, MD  aspirin EC 81 MG tablet Take 81 mg by mouth daily.    [provider]  Calcium Carb-Cholecalciferol (CALCIUM 600+D) 600-800 MG-UNIT TABS Take 1 tablet by mouth daily. Reported on 12/25/2015    [provider]  chlorthalidone (HYGROTON) 25 MG tablet TAKE 1 TABLET BY MOUTH DAILY 03/25/17   Glean Hess, MD  ELIQUIS 5 MG TABS tablet  12/28/16   [provider]  ibuprofen (ADVIL,MOTRIN)  200 MG tablet Take 200 mg by mouth every 6 (six) hours as needed.    [provider]  isosorbide mononitrate (IMDUR) 30 MG 24 hr  tablet TAKE 1 TABLET(30 MG) BY MOUTH DAILY 11/14/17   Glean Hess, MD  latanoprost (XALATAN) 0.005 % ophthalmic solution INT 1 GTT INTO EACH EYE QHS.    [provider]  losartan (COZAAR) 25 MG tablet Take 1 tablet (25 mg total) by mouth daily. 12/16/17   Glean Hess, MD  metoprolol succinate (TOPROL-XL) 50 MG 24 hr tablet TAKE 1 TABLET(50 MG) BY MOUTH EVERY NIGHT 02/08/18   Glean Hess, MD  Netarsudil Dimesylate (RHOPRESSA) 0.02 % SOLN Apply to eye.    [provider]  nitroGLYCERIN (NITROSTAT) 0.4 MG SL tablet Place 0.4 mg under the tongue every 5 (five) minutes as needed for chest pain.    [provider]  timolol (TIMOPTIC) 0.25 % ophthalmic solution INSTILL ONE DROP INTO BOTH EYES EVERY MORNING.    [provider]    Allergies  Allergen Reactions  . Ace Inhibitors     Other reaction(s): Angioedema  . Brinzolamide-Brimonidine     Past Surgical History:  Procedure Laterality Date  . CAROTID ENDARTERECTOMY    . CORONARY ARTERY BYPASS GRAFT  2006  . MASS EXCISION Right 08/28/2015   Procedure: EXCISION MASS; remove massive right chest wall;  Surgeon: Florene Glen, MD;  Location: [facility] ORS;  Service: General;  Laterality: Right;  . MASTECTOMY, RADICAL Right 2002   BREAST CA  . TOTAL ABDOMINAL HYSTERECTOMY      Social History   Tobacco Use  . Smoking status: Former Smoker    Last attempt to quit: 10/04/1952    Years since quitting: 65.4  . Smokeless tobacco: Never Used  Substance Use Topics  . Alcohol use: No    Alcohol/week: 0.0 oz  . Drug use: No     Medication list has been reviewed and updated.  PHQ 2/9 Scores 09/15/2017 10/26/2016 08/11/2016 08/03/2016  PHQ - 2 Score 0 - 0 0  Exception Documentation - Patient refusal - -    Physical Exam  Constitutional: She is oriented to person, place, and time. She appears well-developed and well-nourished. No distress.  HENT:  Head: Normocephalic and atraumatic.  Right Ear:  Tympanic membrane and ear canal normal.  Left Ear: Tympanic membrane and ear canal normal.  Nose: Right sinus exhibits no maxillary sinus tenderness. Left sinus exhibits no maxillary sinus tenderness.  Mouth/Throat: Uvula is midline and oropharynx is clear and moist.  Eyes: Conjunctivae and EOM are normal. Right eye exhibits no discharge. Left eye exhibits no discharge. No scleral icterus.  Neck: Normal range of motion. Carotid bruit is not present. No erythema present. No thyromegaly present.  Cardiovascular: Normal rate, regular rhythm, normal heart sounds and normal pulses.  Pulmonary/Chest: Effort normal. No respiratory distress. She has no wheezes. Left breast exhibits no mass, no nipple discharge, no skin change and no tenderness.  Right mastectomy - skin intact - no mass  Abdominal: Soft. Bowel sounds are normal. There is no hepatosplenomegaly. There is no tenderness. There is no CVA tenderness.  Musculoskeletal: Normal range of motion.  Lymphadenopathy:    She has no cervical adenopathy.    She has no axillary adenopathy.  Neurological: She is alert and oriented to person, place, and time. She has normal reflexes. No cranial nerve deficit or sensory deficit.  Skin: Skin is warm, dry and intact. No rash noted.  Psychiatric:  She has a normal mood and affect. Her speech is normal and behavior is normal. Thought content normal.  Nursing note and vitals reviewed.   BP (!) 142/74   Pulse 67   Ht 5' (1.524 m)   Wt 147 lb (66.7 kg)   SpO2 100%   BMI 28.71 kg/m   Assessment and Plan: 1. Annual physical exam Recommend mental activities Walking daily 10 minutes or more  2. Essential (primary) hypertension controlled  3. Persistent atrial fibrillation (HCC) In SR vs Afib controlled rate today Continue DOAC  4. Senile dementia, uncomplicated Continue supportive care, family involvement  5. Arteriosclerosis of coronary artery Not on statin by pt choice - reasonable based on  age  79. Impaired renal function Recently normal Lab Results  Component Value Date   CREATININE 0.92 12/20/2017   BUN 16 12/20/2017   NA 133 (L) 12/20/2017   K 3.0 (L) 12/20/2017   CL 95 (L) 12/20/2017   CO2 26 12/20/2017    7. Carcinoma of overlapping sites of right breast in female, estrogen receptor positive ([place]) On Arimidex Followed by oncology   No orders of the defined types were placed in this encounter.   Partially dictated using Editor, commissioning. Any errors are unintentional.  Halina Maidens, MD [facility]  02/28/2018

## 2018-02-28 NOTE — Patient Instructions (Signed)
Pneumococcal Conjugate Vaccine (PCV13) What You Need to Know 1. Why get vaccinated? Vaccination can protect both children and adults from pneumococcal disease. Pneumococcal disease is caused by bacteria that can spread from person to person through close contact. It can cause ear infections, and it can also lead to more serious infections of the:  Lungs (pneumonia),  Blood (bacteremia), and  Covering of the brain and spinal cord (meningitis).  Pneumococcal pneumonia is most common among adults. Pneumococcal meningitis can cause deafness and brain damage, and it kills about 1 child in 10 who get it. Anyone can get pneumococcal disease, but children under 2 years of age and adults 65 years and older, people with certain medical conditions, and cigarette smokers are at the highest risk. Before there was a vaccine, the [place] saw:  more than 700 cases of meningitis,  about 13,000 blood infections,  about 5 million ear infections, and  about 200 deaths  in children under 5 each year from pneumococcal disease. Since vaccine became available, severe pneumococcal disease in these children has fallen by 88%. About 18,000 older adults die of pneumococcal disease each year in the [place]. Treatment of pneumococcal infections with penicillin and other drugs is not as effective as it used to be, because some strains of the disease have become resistant to these drugs. This makes prevention of the disease, through vaccination, even more important. 2. PCV13 vaccine Pneumococcal conjugate vaccine (called PCV13) protects against 13 types of pneumococcal bacteria. PCV13 is routinely given to children at 2, 4, 6, and 12-15 months of age. It is also recommended for children and adults 2 to 64 years of age with certain health conditions, and for all adults 65 years of age and older. Your doctor can give you details. 3. Some people should not get this vaccine Anyone who has ever had a  life-threatening allergic reaction to a dose of this vaccine, to an earlier pneumococcal vaccine called PCV7, or to any vaccine containing diphtheria toxoid (for example, DTaP), should not get PCV13. Anyone with a severe allergy to any component of PCV13 should not get the vaccine. Tell your doctor if the person being vaccinated has any severe allergies. If the person scheduled for vaccination is not feeling well, your healthcare provider might decide to reschedule the shot on another day. 4. Risks of a vaccine reaction With any medicine, including vaccines, there is a chance of reactions. These are usually mild and go away on their own, but serious reactions are also possible. Problems reported following PCV13 varied by age and dose in the series. The most common problems reported among children were:  About half became drowsy after the shot, had a temporary loss of appetite, or had redness or tenderness where the shot was given.  About 1 out of 3 had swelling where the shot was given.  About 1 out of 3 had a mild fever, and about 1 in 20 had a fever over 102.2F.  Up to about 8 out of 10 became fussy or irritable.  Adults have reported pain, redness, and swelling where the shot was given; also mild fever, fatigue, headache, chills, or muscle pain. Young children who get PCV13 along with inactivated flu vaccine at the same time may be at increased risk for seizures caused by fever. Ask your doctor for more information. Problems that could happen after any vaccine:  People sometimes faint after a medical procedure, including vaccination. Sitting or lying down for about 15 minutes can help prevent   fainting, and injuries caused by a fall. Tell your doctor if you feel dizzy, or have vision changes or ringing in the ears.  Some older children and adults get severe pain in the shoulder and have difficulty moving the arm where a shot was given. This happens very rarely.  Any medication can cause a  severe allergic reaction. Such reactions from a vaccine are very rare, estimated at about 1 in a million doses, and would happen within a few minutes to a few hours after the vaccination. As with any medicine, there is a very small chance of a vaccine causing a serious injury or death. The safety of vaccines is always being monitored. For more information, visit: www.cdc.gov/vaccinesafety/ 5. What if there is a serious reaction? What should I look for? Look for anything that concerns you, such as signs of a severe allergic reaction, very high fever, or unusual behavior. Signs of a severe allergic reaction can include hives, swelling of the face and throat, difficulty breathing, a fast heartbeat, dizziness, and weakness-usually within a few minutes to a few hours after the vaccination. What should I do?  If you think it is a severe allergic reaction or other emergency that can't wait, call 9-1-1 or get the person to the nearest hospital. Otherwise, call your doctor.  Reactions should be reported to the Vaccine Adverse Event Reporting System (VAERS). Your doctor should file this report, or you can do it yourself through the VAERS web site at www.vaers.hhs.gov, or by calling 1-800-822-7967. ? VAERS does not give medical advice. 6. The National Vaccine Injury Compensation Program The National Vaccine Injury Compensation Program (VICP) is a federal program that was created to compensate people who may have been injured by certain vaccines. Persons who believe they may have been injured by a vaccine can learn about the program and about filing a claim by calling 1-800-338-2382 or visiting the VICP website at www.hrsa.gov/vaccinecompensation. There is a time limit to file a claim for compensation. 7. How can I learn more?  Ask your healthcare provider. He or she can give you the vaccine package insert or suggest other sources of information.  Call your local or state health department.  Contact the  Centers for Disease Control and Prevention (CDC): ? Call 1-800-232-4636 (1-800-CDC-INFO) or ? Visit CDC's website at www.cdc.gov/vaccines Vaccine Information Statement, PCV13 Vaccine (10/03/2014) This information is not intended to replace advice given to you by your health care provider. Make sure you discuss any questions you have with your health care provider. Document Released: 09/12/2006 Document Revised: 08/05/2016 Document Reviewed: 08/05/2016 Elsevier Interactive Patient Education  2017 [employer]  

## 2018-03-01 DIAGNOSIS — L821 Other seborrheic keratosis: Secondary | ICD-10-CM | POA: Diagnosis not present

## 2018-03-01 DIAGNOSIS — L578 Other skin changes due to chronic exposure to nonionizing radiation: Secondary | ICD-10-CM | POA: Diagnosis not present

## 2018-03-01 DIAGNOSIS — L719 Rosacea, unspecified: Secondary | ICD-10-CM | POA: Diagnosis not present

## 2018-03-01 DIAGNOSIS — L570 Actinic keratosis: Secondary | ICD-10-CM | POA: Diagnosis not present

## 2018-03-01 DIAGNOSIS — Z85828 Personal history of other malignant neoplasm of skin: Secondary | ICD-10-CM | POA: Diagnosis not present

## 2018-03-07 ENCOUNTER — Other Ambulatory Visit: Payer: Self-pay | Admitting: Internal Medicine

## 2018-03-16 DIAGNOSIS — Z951 Presence of aortocoronary bypass graft: Secondary | ICD-10-CM | POA: Diagnosis not present

## 2018-03-16 DIAGNOSIS — Z79899 Other long term (current) drug therapy: Secondary | ICD-10-CM | POA: Diagnosis not present

## 2018-03-16 DIAGNOSIS — Z79811 Long term (current) use of aromatase inhibitors: Secondary | ICD-10-CM | POA: Diagnosis not present

## 2018-03-16 DIAGNOSIS — I2510 Atherosclerotic heart disease of native coronary artery without angina pectoris: Secondary | ICD-10-CM | POA: Diagnosis not present

## 2018-03-16 DIAGNOSIS — Z9861 Coronary angioplasty status: Secondary | ICD-10-CM | POA: Diagnosis not present

## 2018-03-16 DIAGNOSIS — Z7901 Long term (current) use of anticoagulants: Secondary | ICD-10-CM | POA: Diagnosis not present

## 2018-03-16 DIAGNOSIS — C50919 Malignant neoplasm of unspecified site of unspecified female breast: Secondary | ICD-10-CM | POA: Diagnosis not present

## 2018-03-16 DIAGNOSIS — I10 Essential (primary) hypertension: Secondary | ICD-10-CM | POA: Diagnosis not present

## 2018-03-16 DIAGNOSIS — I4891 Unspecified atrial fibrillation: Secondary | ICD-10-CM | POA: Diagnosis not present

## 2018-03-16 DIAGNOSIS — Z9889 Other specified postprocedural states: Secondary | ICD-10-CM | POA: Diagnosis not present

## 2018-03-16 DIAGNOSIS — Z7982 Long term (current) use of aspirin: Secondary | ICD-10-CM | POA: Diagnosis not present

## 2018-03-16 DIAGNOSIS — E7800 Pure hypercholesterolemia, unspecified: Secondary | ICD-10-CM | POA: Diagnosis not present

## 2018-03-16 DIAGNOSIS — Z888 Allergy status to other drugs, medicaments and biological substances status: Secondary | ICD-10-CM | POA: Diagnosis not present

## 2018-03-27 ENCOUNTER — Ambulatory Visit: Payer: Medicare Other | Admitting: Internal Medicine

## 2018-03-27 ENCOUNTER — Encounter: Payer: Self-pay | Admitting: Internal Medicine

## 2018-03-27 VITALS — BP 118/78 | HR 73 | Resp 16 | Ht 60.0 in | Wt 145.2 lb

## 2018-03-27 DIAGNOSIS — M25561 Pain in right knee: Secondary | ICD-10-CM | POA: Diagnosis not present

## 2018-03-27 DIAGNOSIS — M25532 Pain in left wrist: Secondary | ICD-10-CM | POA: Diagnosis not present

## 2018-03-27 DIAGNOSIS — M25531 Pain in right wrist: Secondary | ICD-10-CM | POA: Diagnosis not present

## 2018-03-27 DIAGNOSIS — R3989 Other symptoms and signs involving the genitourinary system: Secondary | ICD-10-CM | POA: Diagnosis not present

## 2018-03-27 MED ORDER — PREDNISONE 10 MG PO TABS: ORAL_TABLET | ORAL | 0 refills | Status: DC

## 2018-03-27 NOTE — Progress Notes (Signed)
Date:  03/27/2018   Name:  Donna Santos   DOB:  Jul 17, 1925   MRN:  [id number]   Chief Complaint: Edema (feet and hands swelling and painful. ) and Altered Mental Status (Has ahd many times confused asking about what is date or time.Marland Kitchenodor in urine )  Edema - new finding recently.  Pt complaining about pain.  No change in medication, diet, salt intake.  Swelling and warmth in both wrists started late last week, improving over the weekend with nothing but rest.  Also has some swelling and pain in right knee.  No knowledge of injury but pt was alone last week and could have twisted it.  No hx of gout, no fever or chills. Appetite is good.  Confusion - daughter in law has noticed that she was a bit more confused yesterday.  Urine odor is strong.  Pt denies dysuria.  Review of Systems  Constitutional: Negative for fever and unexpected weight change.  Respiratory: Negative for cough and chest tightness.   Cardiovascular: Negative for chest pain and palpitations.  Genitourinary: Negative for dysuria and frequency.  Musculoskeletal: Positive for arthralgias and joint swelling.  Skin: Negative for rash.  Allergic/Immunologic: Negative for environmental allergies.  Neurological: Negative for dizziness and headaches.  Psychiatric/Behavioral: Negative for sleep disturbance.    Patient Active Problem List   Diagnosis Date Noted  . Persistent atrial fibrillation ([place]) 12/28/2016  . Carcinoma of overlapping sites of right breast in female, estrogen receptor positive ([place]) 12/21/2016  . Arthritis of right knee 08/03/2016  . Senile dementia, uncomplicated 70/62/3762  . Low back pain 04/12/2016  . Impaired renal function 05/09/2015  . Arteriosclerosis of coronary artery 05/09/2015  . Essential (primary) hypertension 05/09/2015  . Personal history of malignant neoplasm of breast 05/09/2015  . Muscle spasms of neck 05/09/2015  . Arthritis of shoulder region, degenerative 05/09/2015  .  Hypercholesteremia 04/20/2012  . Carotid artery disease ([place]) 05/28/2005    Prior to Admission medications   Medication Sig Start Date End Date Taking? Authorizing Provider  amLODipine (NORVASC) 5 MG tablet TAKE 1 TABLET BY MOUTH TWICE DAILY 11/15/17  Yes Glean Hess, MD  anastrozole (ARIMIDEX) 1 MG tablet Take 1 tablet (1 mg total) by mouth daily. 12/20/17  Yes Cammie Sickle, MD  aspirin EC 81 MG tablet Take 81 mg by mouth daily.   Yes [provider]  Calcium Carb-Cholecalciferol (CALCIUM 600+D) 600-800 MG-UNIT TABS Take 1 tablet by mouth daily. Reported on 12/25/2015   Yes [provider]  chlorthalidone (HYGROTON) 25 MG tablet TAKE 1 TABLET BY MOUTH DAILY 03/25/17  Yes Glean Hess, MD  ELIQUIS 5 MG TABS tablet  12/28/16  Yes [provider]  ibuprofen (ADVIL,MOTRIN) 200 MG tablet Take 200 mg by mouth every 6 (six) hours as needed.   Yes [provider]  isosorbide mononitrate (IMDUR) 30 MG 24 hr tablet TAKE 1 TABLET(30 MG) BY MOUTH DAILY 11/14/17  Yes Glean Hess, MD  latanoprost (XALATAN) 0.005 % ophthalmic solution INT 1 GTT INTO EACH EYE QHS.   Yes [provider]  losartan (COZAAR) 25 MG tablet Take 1 tablet (25 mg total) by mouth daily. 12/16/17  Yes Glean Hess, MD  metoprolol succinate (TOPROL-XL) 50 MG 24 hr tablet TAKE 1 TABLET(50 MG) BY MOUTH EVERY NIGHT 03/07/18  Yes Glean Hess, MD  Netarsudil Dimesylate (RHOPRESSA) 0.02 % SOLN Apply to eye.   Yes [provider]  nitroGLYCERIN (NITROSTAT) 0.4 MG SL  tablet Place 0.4 mg under the tongue every 5 (five) minutes as needed for chest pain.   Yes [provider]  timolol (TIMOPTIC) 0.25 % ophthalmic solution INSTILL ONE DROP INTO BOTH EYES EVERY MORNING.   Yes [provider]    Allergies  Allergen Reactions  . Ace Inhibitors     Other reaction(s): Angioedema  . Brinzolamide-Brimonidine     Past Surgical History:  Procedure  Laterality Date  . CAROTID ENDARTERECTOMY    . CORONARY ARTERY BYPASS GRAFT  2006  . MASS EXCISION Right 08/28/2015   Procedure: EXCISION MASS; remove massive right chest wall;  Surgeon: Florene Glen, MD;  Location: [facility] ORS;  Service: General;  Laterality: Right;  . MASTECTOMY, RADICAL Right 2002   BREAST CA  . TOTAL ABDOMINAL HYSTERECTOMY      Social History   Tobacco Use  . Smoking status: Former Smoker    Last attempt to quit: 10/04/1952    Years since quitting: 65.5  . Smokeless tobacco: Never Used  Substance Use Topics  . Alcohol use: No    Alcohol/week: 0.0 oz  . Drug use: No     Medication list has been reviewed and updated.  PHQ 2/9 Scores 03/27/2018 09/15/2017 10/26/2016 08/11/2016  PHQ - 2 Score 0 0 - 0  Exception Documentation - - Patient refusal -    Physical Exam  Constitutional: She is oriented to person, place, and time. She appears well-developed. No distress.  HENT:  Head: Normocephalic and atraumatic.  Cardiovascular: Normal rate, regular rhythm and normal heart sounds.  Pulmonary/Chest: Effort normal and breath sounds normal. No respiratory distress.  Musculoskeletal:       Right wrist: She exhibits tenderness and swelling.       Left wrist: She exhibits tenderness and swelling.       Right knee: She exhibits decreased range of motion and swelling. She exhibits no effusion and no erythema. Tenderness found. Patellar tendon tenderness noted.  Neurological: She is alert and oriented to person, place, and time.  Skin: Skin is warm and dry. No rash noted.  Psychiatric: She has a normal mood and affect. Her behavior is normal. Thought content normal.    BP 118/78   Pulse 73   Resp 16   Ht 5' (1.524 m)   Wt 145 lb 3.2 oz (65.9 kg)   SpO2 98%   BMI 28.36 kg/m   Assessment and Plan: 1. Pain in both wrists Suspicious for gout - predniSONE (DELTASONE) 10 MG tablet; Take 6 on day 1, 5 on day 2, 4 on day 3, 3 on day 4, 2 on day 5 and 1 on day 1 then  stop.  Dispense: 21 tablet; Refill: 0  2. Knee pain, right anterior Suspect twisted knee - rest and elevation recommended  3. Urinary problem Family given collection cup to return in the AM   Meds ordered this encounter  Medications  . predniSONE (DELTASONE) 10 MG tablet    Sig: Take 6 on day 1, 5 on day 2, 4 on day 3, 3 on day 4, 2 on day 5 and 1 on day 1 then stop.    Dispense:  21 tablet    Refill:  0    Partially dictated using Editor, commissioning. Any errors are unintentional.  Halina Maidens, MD [facility]  03/27/2018

## 2018-03-28 ENCOUNTER — Other Ambulatory Visit: Payer: Self-pay | Admitting: Internal Medicine

## 2018-03-28 DIAGNOSIS — R350 Frequency of micturition: Secondary | ICD-10-CM

## 2018-03-28 LAB — POC URINALYSIS WITH MICROSCOPIC (NON AUTO)MANUAL RESULT
BILIRUBIN UA: NEGATIVE
Crystals: 0
Epithelial cells, urine per micros: 5
Glucose, UA: NEGATIVE
KETONES UA: NEGATIVE
Mucus, UA: 0
NITRITE UA: NEGATIVE
PH UA: 5 (ref 5.0–8.0)
Protein, UA: NEGATIVE
RBC: 0 M/uL — AB (ref 4.04–5.48)
Spec Grav, UA: 1.01 (ref 1.010–1.025)
UROBILINOGEN UA: 0.2 U/dL
WBC Casts, UA: 15

## 2018-03-28 MED ORDER — CEPHALEXIN 500 MG PO CAPS: 500.0000 mg | ORAL_CAPSULE | Freq: Four times a day (QID) | ORAL | 0 refills | Status: AC

## 2018-04-20 DIAGNOSIS — H401122 Primary open-angle glaucoma, left eye, moderate stage: Secondary | ICD-10-CM | POA: Diagnosis not present

## 2018-04-25 DIAGNOSIS — H401122 Primary open-angle glaucoma, left eye, moderate stage: Secondary | ICD-10-CM | POA: Diagnosis not present

## 2018-05-15 DIAGNOSIS — I2510 Atherosclerotic heart disease of native coronary artery without angina pectoris: Secondary | ICD-10-CM | POA: Diagnosis not present

## 2018-05-15 DIAGNOSIS — Z7982 Long term (current) use of aspirin: Secondary | ICD-10-CM | POA: Diagnosis not present

## 2018-05-15 DIAGNOSIS — Z853 Personal history of malignant neoplasm of breast: Secondary | ICD-10-CM | POA: Diagnosis not present

## 2018-05-15 DIAGNOSIS — Z955 Presence of coronary angioplasty implant and graft: Secondary | ICD-10-CM | POA: Diagnosis not present

## 2018-05-15 DIAGNOSIS — Z951 Presence of aortocoronary bypass graft: Secondary | ICD-10-CM | POA: Diagnosis not present

## 2018-05-15 DIAGNOSIS — I4891 Unspecified atrial fibrillation: Secondary | ICD-10-CM | POA: Diagnosis not present

## 2018-05-15 DIAGNOSIS — Z888 Allergy status to other drugs, medicaments and biological substances status: Secondary | ICD-10-CM | POA: Diagnosis not present

## 2018-05-15 DIAGNOSIS — Z7901 Long term (current) use of anticoagulants: Secondary | ICD-10-CM | POA: Diagnosis not present

## 2018-05-15 DIAGNOSIS — H401123 Primary open-angle glaucoma, left eye, severe stage: Secondary | ICD-10-CM | POA: Diagnosis not present

## 2018-05-15 DIAGNOSIS — I10 Essential (primary) hypertension: Secondary | ICD-10-CM | POA: Diagnosis not present

## 2018-05-23 ENCOUNTER — Other Ambulatory Visit: Payer: Self-pay | Admitting: Internal Medicine

## 2018-05-23 DIAGNOSIS — I2510 Atherosclerotic heart disease of native coronary artery without angina pectoris: Secondary | ICD-10-CM

## 2018-06-01 ENCOUNTER — Other Ambulatory Visit: Payer: Self-pay | Admitting: Internal Medicine

## 2018-06-20 ENCOUNTER — Other Ambulatory Visit: Payer: Self-pay

## 2018-06-20 ENCOUNTER — Inpatient Hospital Stay: Payer: Medicare Other

## 2018-06-20 ENCOUNTER — Inpatient Hospital Stay: Payer: Medicare Other | Attending: Internal Medicine | Admitting: Internal Medicine

## 2018-06-20 VITALS — BP 187/72 | HR 64 | Resp 20 | Ht 60.0 in | Wt 140.0 lb

## 2018-06-20 DIAGNOSIS — C50811 Malignant neoplasm of overlapping sites of right female breast: Secondary | ICD-10-CM | POA: Diagnosis not present

## 2018-06-20 DIAGNOSIS — F0390 Unspecified dementia without behavioral disturbance: Secondary | ICD-10-CM | POA: Diagnosis not present

## 2018-06-20 DIAGNOSIS — Z170 Estrogen receptor positive status [ER+]: Principal | ICD-10-CM

## 2018-06-20 DIAGNOSIS — M810 Age-related osteoporosis without current pathological fracture: Secondary | ICD-10-CM | POA: Diagnosis not present

## 2018-06-20 DIAGNOSIS — Z79811 Long term (current) use of aromatase inhibitors: Secondary | ICD-10-CM | POA: Insufficient documentation

## 2018-06-20 DIAGNOSIS — E876 Hypokalemia: Secondary | ICD-10-CM | POA: Insufficient documentation

## 2018-06-20 DIAGNOSIS — I4891 Unspecified atrial fibrillation: Secondary | ICD-10-CM

## 2018-06-20 DIAGNOSIS — Z87891 Personal history of nicotine dependence: Secondary | ICD-10-CM | POA: Diagnosis not present

## 2018-06-20 DIAGNOSIS — I10 Essential (primary) hypertension: Secondary | ICD-10-CM | POA: Insufficient documentation

## 2018-06-20 DIAGNOSIS — Z7901 Long term (current) use of anticoagulants: Secondary | ICD-10-CM | POA: Insufficient documentation

## 2018-06-20 LAB — CBC WITH DIFFERENTIAL/PLATELET
Basophils Absolute: 0 10*3/uL (ref 0–0.1)
Basophils Relative: 1 %
Eosinophils Absolute: 0.1 10*3/uL (ref 0–0.7)
Eosinophils Relative: 2 %
HCT: 41.1 % (ref 35.0–47.0)
HEMOGLOBIN: 13.8 g/dL (ref 12.0–16.0)
LYMPHS PCT: 12 %
Lymphs Abs: 0.8 10*3/uL — ABNORMAL LOW (ref 1.0–3.6)
MCH: 31.1 pg (ref 26.0–34.0)
MCHC: 33.7 g/dL (ref 32.0–36.0)
MCV: 92.4 fL (ref 80.0–100.0)
Monocytes Absolute: 0.7 10*3/uL (ref 0.2–0.9)
Monocytes Relative: 10 %
NEUTROS PCT: 75 %
Neutro Abs: 4.9 10*3/uL (ref 1.4–6.5)
Platelets: 390 10*3/uL (ref 150–440)
RBC: 4.44 MIL/uL (ref 3.80–5.20)
RDW: 15 % — ABNORMAL HIGH (ref 11.5–14.5)
WBC: 6.5 10*3/uL (ref 3.6–11.0)

## 2018-06-20 LAB — COMPREHENSIVE METABOLIC PANEL
ALBUMIN: 4.2 g/dL (ref 3.5–5.0)
ALT: 10 U/L (ref 0–44)
AST: 18 U/L (ref 15–41)
Alkaline Phosphatase: 35 U/L — ABNORMAL LOW (ref 38–126)
Anion gap: 13 (ref 5–15)
BUN: 20 mg/dL (ref 8–23)
CHLORIDE: 99 mmol/L (ref 98–111)
CO2: 25 mmol/L (ref 22–32)
Calcium: 9.5 mg/dL (ref 8.9–10.3)
Creatinine, Ser: 0.91 mg/dL (ref 0.44–1.00)
GFR calc Af Amer: >60 mL/min (ref >60)
GFR calc non Af Amer: 54 mL/min — ABNORMAL LOW (ref >60)
Glucose, Bld: 115 mg/dL — ABNORMAL HIGH (ref 70–99)
Potassium: 3.2 mmol/L — ABNORMAL LOW (ref 3.5–5.1)
Sodium: 137 mmol/L (ref 135–145)
Total Bilirubin: 0.6 mg/dL (ref 0.3–1.2)
Total Protein: 7.7 g/dL (ref 6.5–8.1)

## 2018-06-20 NOTE — Assessment & Plan Note (Addendum)
#  RIGHT IPSILATERAL CHEST WALL RECURRENCE STATUS post mastectomy 2002- Cribiform type; ER/PR >90%; Her2 Neu-NEG. Status post radiation.  No evidence of recurrence stable.  Continue Arimidex.  Ordered mammogram.  # Mild hypokalemia- 3.2; secondary to chlorthalidone.  Recommend increased dietary intake.   # Elevated Blood pressure-180s; recommend log of BPs at home; and reaching out to PCP if elevated.   # Osteoporosis. BMD- jan 2018; recommend continue calcium vitamin D.  Patient declined bisphosphonates.  # A.fib on Eliquis- no falls; again recommend close monitoring.  #Dementia-slightly worse as per family; recommend follow-up with PCP/neurology.  #Follow-up in 6 months labs.

## 2018-06-20 NOTE — Progress Notes (Signed)
[place] OFFICE PROGRESS NOTE  Patient Care Team: Glean Hess, MD as PCP - General (Family Medicine) Sol Passer, MD as Referring Physician (Cardiology) Cammie Sickle, MD as Consulting Physician (Internal Medicine) Noreene Filbert, MD as Referring Physician (Radiation Oncology)  Cancer Staging No matching staging information was found for the patient.   Oncology History   # 2002- Right Breast" Predominant DCIS- cribriform/low grade; 1.5cm with 5% early invasion"; ER 74%; PR-NEG; her 2 Neg; S/P Mastec & ALND [no residual ca/dcis] & Recon]; NO TAM  # 2016- CHEST WALL RECURRENCE [~2.5CM] s/p RIGHT excisional Biopsy; positive deep margin [ Dr.Cooper] Cribriform type; ER/PR >90%; HER 2 neu-NEG; s/p RT; Arimidex  # Lung nodule [99m LL; Oct 2016]; Left kidney ? 2.8cm Complex Cyst- [place]-NEG for mass; Pancreas 10 mm cyst  # OSTEOPOROSIS [BMD, OCT 2016] ca+ vit D ; declined Prolia     Carcinoma of overlapping sites of right breast in female, estrogen receptor positive ([place] [employer]      INTERVAL HISTORY:  Donna Santos 84y.o.  female pleasant patient above history of breast cancer ER PR positive HER-2 negative with chest wall recurrent/metastatic is here for follow-up.  Patient had a recent cataract surgery; currently using prednisolone eyedrops.  As per family patient has been slightly more confused; noted to have worsening memory changes.  Otherwise denies any lumps or bumps.  Appetite is good but no falls.  No blood in stools black or stools.  Review of Systems  Constitutional: Negative for chills, diaphoresis, fever, malaise/fatigue and weight loss.  HENT: Negative for nosebleeds and sore throat.   Eyes: Negative for double vision.  Respiratory: Negative for cough, hemoptysis, sputum production, shortness of breath and wheezing.   Cardiovascular: Negative for chest pain, palpitations, orthopnea and leg swelling.  Gastrointestinal: Negative for  abdominal pain, blood in stool, constipation, diarrhea, heartburn, melena, nausea and vomiting.  Genitourinary: Negative for dysuria, frequency and urgency.  Musculoskeletal: Negative for back pain and joint pain.  Skin: Negative.  Negative for itching and rash.  Neurological: Negative for dizziness, tingling, focal weakness, weakness and headaches.  Endo/Heme/Allergies: Does not bruise/bleed easily.  Psychiatric/Behavioral: Positive for memory loss. Negative for depression. The patient is not nervous/anxious and does not have insomnia.       PAST MEDICAL HISTORY :  Past Medical History:  Diagnosis Date  . Arteriosclerosis of coronary artery   . Arthritis of shoulder region   . Breast cancer [place] 2002   RT MASTECTOMY  . Cataract   . Essential hypertension   . Hypercholesteremia   . Impaired renal function   . Muscle spasms of neck   . Tobacco abuse     PAST SURGICAL HISTORY :   Past Surgical History:  Procedure Laterality Date  . CAROTID ENDARTERECTOMY    . CORONARY ARTERY BYPASS GRAFT  2006  . MASS EXCISION Right 08/28/2015   Procedure: EXCISION MASS; remove massive right chest wall;  Surgeon: RFlorene Glen MD;  Location: [facility] ORS;  Service: General;  Laterality: Right;  . MASTECTOMY, RADICAL Right 2002   BREAST CA  . TOTAL ABDOMINAL HYSTERECTOMY      FAMILY HISTORY :   Family History  Problem Relation Age of Onset  . Hypertension Father   . Cancer Father   . Breast cancer Neg Hx     SOCIAL HISTORY:   Social History   Tobacco Use  . Smoking status: Former Smoker    Last attempt to quit: 10/04/1952  Years since quitting: 65.7  . Smokeless tobacco: Never Used  Substance Use Topics  . Alcohol use: No    Alcohol/week: 0.0 oz  . Drug use: No    ALLERGIES:  is allergic to ace inhibitors and brinzolamide-brimonidine.  MEDICATIONS:  Current Outpatient Medications  Medication Sig Dispense Refill  . amLODipine (NORVASC) 5 MG tablet TAKE 1 TABLET BY MOUTH  TWICE DAILY 60 tablet 12  . anastrozole (ARIMIDEX) 1 MG tablet Take 1 tablet (1 mg total) by mouth daily. 90 tablet 4  . aspirin EC 81 MG tablet Take 81 mg by mouth daily.    . Calcium Carb-Cholecalciferol (CALCIUM 600+D) 600-800 MG-UNIT TABS Take 1 tablet by mouth daily. Reported on 12/25/2015    . chlorthalidone (HYGROTON) 25 MG tablet TAKE 1 TABLET BY MOUTH DAILY 30 tablet 12  . ELIQUIS 5 MG TABS tablet     . ibuprofen (ADVIL,MOTRIN) 200 MG tablet Take 200 mg by mouth every 6 (six) hours as needed.    . isosorbide mononitrate (IMDUR) 30 MG 24 hr tablet TAKE 1 TABLET(30 MG) BY MOUTH DAILY 30 tablet 0  . latanoprost (XALATAN) 0.005 % ophthalmic solution INT 1 GTT INTO EACH EYE QHS.  6  . losartan (COZAAR) 25 MG tablet Take 1 tablet (25 mg total) by mouth daily. 30 tablet 12  . metoprolol succinate (TOPROL-XL) 50 MG 24 hr tablet TAKE 1 TABLET(50 MG) BY MOUTH EVERY NIGHT 30 tablet 12  . prednisoLONE acetate (PRED FORTE) 1 % ophthalmic suspension Place 1 drop into the left eye 2 (two) times daily.    . timolol (TIMOPTIC) 0.25 % ophthalmic solution INSTILL ONE DROP INTO BOTH EYES EVERY MORNING.  6  . nitroGLYCERIN (NITROSTAT) 0.4 MG SL tablet Place 0.4 mg under the tongue every 5 (five) minutes as needed for chest pain.     No current facility-administered medications for this visit.     PHYSICAL EXAMINATION: ECOG PERFORMANCE STATUS: 0 - Asymptomatic  BP (!) 187/72 (BP Location: Left Arm, Patient Position: Sitting)   Pulse 64   Resp 20   Ht 5' (1.524 m)   Wt 139 lb 15.9 oz (63.5 kg)   BMI 27.34 kg/m   Filed Weights   06/20/18 1111  Weight: 139 lb 15.9 oz (63.5 kg)    GENERAL: Well-nourished well-developed; Alert, no distress and comfortable.  Accompanied by family.  EYES: no pallor or icterus OROPHARYNX: no thrush or ulceration; NECK: supple; no lymph nodes felt. LYMPH:  no palpable lymphadenopathy in the axillary or inguinal regions LUNGS: Decreased breath sounds auscultation  bilaterally. No wheeze or crackles HEART/CVS: regular rate & rhythm and no murmurs; No lower extremity edema ABDOMEN:abdomen soft, non-tender and normal bowel sounds. No hepatomegaly or splenomegaly.  Musculoskeletal:no cyanosis of digits and no clubbing  PSYCH: alert & oriented x 3 with fluent speech NEURO: no focal motor/sensory deficits SKIN:  no rashes or significant lesions Left BREAST exam [in the presence of nurse]- no unusual skin changes or dominant masses felt. Right mastectomy noted;    LABORATORY DATA:  I have reviewed the data as listed    Component Value Date/Time   NA 137 06/20/2018 1048   NA 139 01/04/2017 1300   K 3.2 (L) 06/20/2018 1048   CL 99 06/20/2018 1048   CO2 25 06/20/2018 1048   GLUCOSE 115 (H) 06/20/2018 1048   BUN 20 06/20/2018 1048   BUN 18 12/13/2017   CREATININE 0.91 06/20/2018 1048   CALCIUM 9.5 06/20/2018 1048   PROT 7.7  06/20/2018 1048   PROT 7.6 08/03/2016 1153   ALBUMIN 4.2 06/20/2018 1048   ALBUMIN 4.5 08/03/2016 1153   AST 18 06/20/2018 1048   ALT 10 06/20/2018 1048   ALKPHOS 35 (L) 06/20/2018 1048   BILITOT 0.6 06/20/2018 1048   BILITOT 0.4 08/03/2016 1153   GFRNONAA 54 (L) 06/20/2018 1048   GFRAA >60 06/20/2018 1048    No results found for: SPEP, UPEP  Lab Results  Component Value Date   WBC 6.5 06/20/2018   NEUTROABS 4.9 06/20/2018   HGB 13.8 06/20/2018   HCT 41.1 06/20/2018   MCV 92.4 06/20/2018   PLT 390 06/20/2018      Chemistry      Component Value Date/Time   NA 137 06/20/2018 1048   NA 139 01/04/2017 1300   K 3.2 (L) 06/20/2018 1048   CL 99 06/20/2018 1048   CO2 25 06/20/2018 1048   BUN 20 06/20/2018 1048   BUN 18 12/13/2017   CREATININE 0.91 06/20/2018 1048      Component Value Date/Time   CALCIUM 9.5 06/20/2018 1048   ALKPHOS 35 (L) 06/20/2018 1048   AST 18 06/20/2018 1048   ALT 10 06/20/2018 1048   BILITOT 0.6 06/20/2018 1048   BILITOT 0.4 08/03/2016 1153       RADIOGRAPHIC STUDIES: I have  personally reviewed the radiological images as listed and agreed with the findings in the report. No results found.   ASSESSMENT & PLAN:  Carcinoma of overlapping sites of right breast in female, estrogen receptor positive ([place]) # RIGHT IPSILATERAL CHEST WALL RECURRENCE STATUS post mastectomy 2002- Cribiform type; ER/PR >90%; Her2 Neu-NEG. Status post radiation.  No evidence of recurrence stable.  Continue Arimidex.  Ordered mammogram.  # Mild hypokalemia- 3.2; secondary to chlorthalidone.  Recommend increased dietary intake.   # Elevated Blood pressure-180s; recommend log of BPs at home; and reaching out to PCP if elevated.   # Osteoporosis. BMD- jan 2018; recommend continue calcium vitamin D.  Patient declined bisphosphonates.  # A.fib on Eliquis- no falls; again recommend close monitoring.  #Dementia-slightly worse as per family; recommend follow-up with PCP/neurology.  #Follow-up in 6 months labs.   Orders Placed This Encounter  Procedures  . MM 3D SCREEN BREAST UNI LEFT    Standing Status:   Future    Standing Expiration Date:   08/22/2019    Order Specific Question:   Reason for Exam (SYMPTOM  OR DIAGNOSIS REQUIRED)    Answer:   history of breast cancer    Order Specific Question:   Preferred imaging location?    Answer:   [facility] [place]  . CBC with Differential/Platelet    Standing Status:   Future    Standing Expiration Date:   06/21/2019  . Comprehensive metabolic panel    Standing Status:   Future    Standing Expiration Date:   06/21/2019   All questions were answered. The patient knows to call the clinic with any problems, questions or concerns.      Cammie Sickle, MD 06/20/2018 11:59 AM

## 2018-06-23 ENCOUNTER — Other Ambulatory Visit: Payer: Self-pay | Admitting: Family Medicine

## 2018-06-23 DIAGNOSIS — I2510 Atherosclerotic heart disease of native coronary artery without angina pectoris: Secondary | ICD-10-CM

## 2018-06-27 ENCOUNTER — Ambulatory Visit
Admission: RE | Admit: 2018-06-27 | Discharge: 2018-06-27 | Disposition: A | Discharge status: Home / Self Care | Payer: Medicare Other | Source: Ambulatory Visit | Attending: Internal Medicine | Admitting: Internal Medicine

## 2018-06-27 DIAGNOSIS — Z1231 Encounter for screening mammogram for malignant neoplasm of breast: Secondary | ICD-10-CM | POA: Insufficient documentation

## 2018-06-27 DIAGNOSIS — Z170 Estrogen receptor positive status [ER+]: Secondary | ICD-10-CM | POA: Diagnosis not present

## 2018-06-27 DIAGNOSIS — Z853 Personal history of malignant neoplasm of breast: Secondary | ICD-10-CM | POA: Insufficient documentation

## 2018-06-27 DIAGNOSIS — C50811 Malignant neoplasm of overlapping sites of right female breast: Secondary | ICD-10-CM

## 2018-06-30 DIAGNOSIS — Z7982 Long term (current) use of aspirin: Secondary | ICD-10-CM | POA: Diagnosis not present

## 2018-06-30 DIAGNOSIS — I4891 Unspecified atrial fibrillation: Secondary | ICD-10-CM | POA: Diagnosis not present

## 2018-06-30 DIAGNOSIS — I10 Essential (primary) hypertension: Secondary | ICD-10-CM | POA: Diagnosis not present

## 2018-06-30 DIAGNOSIS — C50919 Malignant neoplasm of unspecified site of unspecified female breast: Secondary | ICD-10-CM | POA: Diagnosis not present

## 2018-06-30 DIAGNOSIS — I2510 Atherosclerotic heart disease of native coronary artery without angina pectoris: Secondary | ICD-10-CM | POA: Diagnosis not present

## 2018-06-30 DIAGNOSIS — Z87891 Personal history of nicotine dependence: Secondary | ICD-10-CM | POA: Diagnosis not present

## 2018-06-30 DIAGNOSIS — Z79899 Other long term (current) drug therapy: Secondary | ICD-10-CM | POA: Diagnosis not present

## 2018-06-30 DIAGNOSIS — Z7901 Long term (current) use of anticoagulants: Secondary | ICD-10-CM | POA: Diagnosis not present

## 2018-07-05 DIAGNOSIS — H401122 Primary open-angle glaucoma, left eye, moderate stage: Secondary | ICD-10-CM | POA: Diagnosis not present

## 2018-07-22 ENCOUNTER — Other Ambulatory Visit: Payer: Self-pay | Admitting: Family Medicine

## 2018-07-22 DIAGNOSIS — I2510 Atherosclerotic heart disease of native coronary artery without angina pectoris: Secondary | ICD-10-CM

## 2018-09-26 DIAGNOSIS — H401132 Primary open-angle glaucoma, bilateral, moderate stage: Secondary | ICD-10-CM | POA: Diagnosis not present

## 2018-09-26 DIAGNOSIS — H02886 Meibomian gland dysfunction of left eye, unspecified eyelid: Secondary | ICD-10-CM | POA: Diagnosis not present

## 2018-09-26 DIAGNOSIS — H02883 Meibomian gland dysfunction of right eye, unspecified eyelid: Secondary | ICD-10-CM | POA: Diagnosis not present

## 2018-12-05 ENCOUNTER — Other Ambulatory Visit: Payer: Self-pay | Admitting: Internal Medicine

## 2018-12-05 ENCOUNTER — Ambulatory Visit (INDEPENDENT_AMBULATORY_CARE_PROVIDER_SITE_OTHER): Payer: Medicare Other | Admitting: Internal Medicine

## 2018-12-05 ENCOUNTER — Encounter: Payer: Self-pay | Admitting: Internal Medicine

## 2018-12-05 VITALS — BP 136/80 | HR 93 | Ht 60.0 in | Wt 145.6 lb

## 2018-12-05 DIAGNOSIS — M545 Low back pain, unspecified: Secondary | ICD-10-CM

## 2018-12-05 DIAGNOSIS — N3000 Acute cystitis without hematuria: Secondary | ICD-10-CM

## 2018-12-05 LAB — POC URINALYSIS WITH MICROSCOPIC (NON AUTO)MANUAL RESULT
Bilirubin, UA: NEGATIVE
Blood, UA: NEGATIVE
CRYSTALS: 0
Epithelial cells, urine per micros: 2
GLUCOSE UA: NEGATIVE
Ketones, UA: NEGATIVE
Mucus, UA: 0
Nitrite, UA: NEGATIVE
PH UA: 5 (ref 5.0–8.0)
PROTEIN UA: NEGATIVE
RBC: 0 M/uL — AB (ref 4.04–5.48)
Spec Grav, UA: 1.01 (ref 1.010–1.025)
Urobilinogen, UA: 0.2 E.U./dL
WBC Casts, UA: 5

## 2018-12-05 MED ORDER — CEFUROXIME AXETIL 250 MG PO TABS: 250.0000 mg | ORAL_TABLET | Freq: Two times a day (BID) | ORAL | 0 refills | Status: AC

## 2018-12-05 NOTE — Progress Notes (Signed)
Date:  12/05/2018   Name:  Donna Santos   DOB:  04-11-1925   MRN:  [id number]   Chief Complaint: Back Pain (Lower back pain and off. More on left side. )  Back Pain  This is a new problem. The current episode started 1 to 4 weeks ago. The problem occurs intermittently. The problem has been gradually worsening (worse over the past week) since onset. The pain is present in the lumbar spine and sacro-iliac. The quality of the pain is described as aching. The pain does not radiate. The pain is mild. Pertinent negatives include no chest pain or fever. Risk factors include history of cancer.    Review of Systems  Constitutional: Negative for chills, fatigue and fever.  Respiratory: Negative for cough, chest tightness, shortness of breath and wheezing.   Cardiovascular: Negative for chest pain, palpitations and leg swelling.  Genitourinary: Negative for hematuria and urgency.  Musculoskeletal: Positive for back pain.  Hematological: Negative for adenopathy.    Patient Active Problem List   Diagnosis Date Noted  . Persistent atrial fibrillation 12/28/2016  . Carcinoma of overlapping sites of right breast in female, estrogen receptor positive ([place]) 12/21/2016  . Arthritis of right knee 08/03/2016  . Senile dementia, uncomplicated ([place]) 65/01/5464  . Low back pain 04/12/2016  . Impaired renal function 05/09/2015  . Arteriosclerosis of coronary artery 05/09/2015  . Essential (primary) hypertension 05/09/2015  . Personal history of malignant neoplasm of breast 05/09/2015  . Muscle spasms of neck 05/09/2015  . Arthritis of shoulder region, degenerative 05/09/2015  . Hypercholesteremia 04/20/2012  . Carotid artery disease ([place]) 05/28/2005    Allergies  Allergen Reactions  . Ace Inhibitors     Other reaction(s): Angioedema  . Brinzolamide-Brimonidine     Past Surgical History:  Procedure Laterality Date  . CAROTID ENDARTERECTOMY    . CORONARY ARTERY BYPASS GRAFT  2006  . MASS  EXCISION Right 08/28/2015   Procedure: EXCISION MASS; remove massive right chest wall;  Surgeon: Florene Glen, MD;  Location: [facility] ORS;  Service: General;  Laterality: Right;  . MASTECTOMY, RADICAL Right 2002   BREAST CA  . TOTAL ABDOMINAL HYSTERECTOMY      Social History   Tobacco Use  . Smoking status: Former Smoker    Last attempt to quit: 10/04/1952    Years since quitting: 66.2  . Smokeless tobacco: Never Used  Substance Use Topics  . Alcohol use: No    Alcohol/week: 0.0 standard drinks  . Drug use: No     Medication list has been reviewed and updated.  Current Meds  Medication Sig  . amLODipine (NORVASC) 5 MG tablet TAKE 1 TABLET BY MOUTH TWICE DAILY  . anastrozole (ARIMIDEX) 1 MG tablet Take 1 tablet (1 mg total) by mouth daily.  Marland Kitchen aspirin EC 81 MG tablet Take 81 mg by mouth daily.  . Calcium Carb-Cholecalciferol (CALCIUM 600+D) 600-800 MG-UNIT TABS Take 1 tablet by mouth daily. Reported on 12/25/2015  . chlorthalidone (HYGROTON) 25 MG tablet TAKE 1 TABLET BY MOUTH DAILY  . ELIQUIS 5 MG TABS tablet   . ibuprofen (ADVIL,MOTRIN) 200 MG tablet Take 200 mg by mouth every 6 (six) hours as needed.  . isosorbide mononitrate (IMDUR) 30 MG 24 hr tablet TAKE 1 TABLET(30 MG) BY MOUTH DAILY  . latanoprost (XALATAN) 0.005 % ophthalmic solution INT 1 GTT INTO EACH EYE QHS.  Marland Kitchen losartan (COZAAR) 25 MG tablet Take 1 tablet (25 mg total) by mouth daily.  . metoprolol  succinate (TOPROL-XL) 50 MG 24 hr tablet TAKE 1 TABLET(50 MG) BY MOUTH EVERY NIGHT  . nitroGLYCERIN (NITROSTAT) 0.4 MG SL tablet Place 0.4 mg under the tongue every 5 (five) minutes as needed for chest pain.  . prednisoLONE acetate (PRED FORTE) 1 % ophthalmic suspension Place 1 drop into the left eye 2 (two) times daily.  . timolol (TIMOPTIC) 0.25 % ophthalmic solution INSTILL ONE DROP INTO BOTH EYES EVERY MORNING.    PHQ 2/9 Scores 03/27/2018 09/15/2017 10/26/2016 08/11/2016  PHQ - 2 Score 0 0 - 0  Exception Documentation  - - Patient refusal -    Physical Exam Vitals signs and nursing note reviewed.  Constitutional:      General: She is not in acute distress.    Appearance: She is well-developed.  HENT:     Head: Normocephalic and atraumatic.     Mouth/Throat:     Mouth: Mucous membranes are moist.  Neck:     Musculoskeletal: Normal range of motion and neck supple.  Cardiovascular:     Rate and Rhythm: Normal rate. Rhythm irregular.     Pulses: Normal pulses.  Pulmonary:     Effort: Pulmonary effort is normal. No respiratory distress.  Abdominal:     General: Abdomen is flat.     Palpations: Abdomen is soft.     Tenderness: There is no abdominal tenderness.  Musculoskeletal: Normal range of motion.     Right hip: Normal. She exhibits normal range of motion.     Left hip: Normal. She exhibits normal range of motion.     Lumbar back: She exhibits tenderness (over left SI region). She exhibits no bony tenderness, no edema, no deformity and no spasm.  Skin:    General: Skin is warm and dry.     Findings: No rash.  Neurological:     Mental Status: She is alert and oriented to person, place, and time.  Psychiatric:        Behavior: Behavior normal.        Thought Content: Thought content normal.   Urine dipstick shows positive for WBC's and positive for leukocytes.  Micro exam: 5 WBC's per HPF.   BP 136/80 (BP Location: Right Arm, Patient Position: Sitting, Cuff Size: Normal)   Pulse 93 Comment: IRR  Ht 5' (1.524 m)   Wt 145 lb 9.6 oz (66 kg)   SpO2 97%   BMI 28.44 kg/m   Assessment and Plan: 1. Left-sided low back pain without sciatica, unspecified chronicity Suspect due to UTI Use essential oils topically as needed Xray if sx worsen - POC urinalysis w microscopic (non auto)  2. Acute cystitis without hematuria - Urine Culture - cefUROXime (CEFTIN) 250 MG tablet; Take 1 tablet (250 mg total) by mouth 2 (two) times daily with a meal for 7 days.  Dispense: 14 tablet; Refill:  0   Partially dictated using Editor, commissioning. Any errors are unintentional.  Halina Maidens, MD [facility]  12/05/2018

## 2018-12-07 LAB — URINE CULTURE

## 2018-12-13 ENCOUNTER — Other Ambulatory Visit: Payer: Self-pay | Admitting: Internal Medicine

## 2018-12-13 ENCOUNTER — Ambulatory Visit
Admission: RE | Admit: 2018-12-13 | Discharge: 2018-12-13 | Disposition: A | Discharge status: Home / Self Care | Payer: Medicare Other | Source: Ambulatory Visit | Attending: Internal Medicine | Admitting: Internal Medicine

## 2018-12-13 DIAGNOSIS — M545 Low back pain, unspecified: Secondary | ICD-10-CM

## 2018-12-13 NOTE — Progress Notes (Signed)
Patient informed and daughter informed.

## 2018-12-19 ENCOUNTER — Encounter: Payer: Self-pay | Admitting: Internal Medicine

## 2018-12-19 ENCOUNTER — Encounter (INDEPENDENT_AMBULATORY_CARE_PROVIDER_SITE_OTHER): Payer: Self-pay

## 2018-12-19 ENCOUNTER — Ambulatory Visit
Admission: RE | Admit: 2018-12-19 | Discharge: 2018-12-19 | Disposition: A | Discharge status: Home / Self Care | Payer: Medicare Other | Source: Ambulatory Visit | Attending: Internal Medicine | Admitting: Internal Medicine

## 2018-12-19 ENCOUNTER — Other Ambulatory Visit: Payer: Self-pay

## 2018-12-19 ENCOUNTER — Telehealth: Payer: Self-pay | Admitting: Internal Medicine

## 2018-12-19 ENCOUNTER — Inpatient Hospital Stay: Payer: Medicare Other | Attending: Internal Medicine | Admitting: Internal Medicine

## 2018-12-19 ENCOUNTER — Inpatient Hospital Stay: Payer: Medicare Other

## 2018-12-19 ENCOUNTER — Ambulatory Visit
Admission: RE | Admit: 2018-12-19 | Discharge: 2018-12-19 | Disposition: A | Discharge status: Home / Self Care | Payer: Medicare Other | Attending: Internal Medicine | Admitting: Internal Medicine

## 2018-12-19 VITALS — BP 139/85 | HR 92 | Temp 97.2°F | Resp 20 | Ht 60.0 in | Wt 147.7 lb

## 2018-12-19 DIAGNOSIS — C50811 Malignant neoplasm of overlapping sites of right female breast: Secondary | ICD-10-CM | POA: Insufficient documentation

## 2018-12-19 DIAGNOSIS — Z87891 Personal history of nicotine dependence: Secondary | ICD-10-CM | POA: Insufficient documentation

## 2018-12-19 DIAGNOSIS — M545 Low back pain: Secondary | ICD-10-CM | POA: Diagnosis not present

## 2018-12-19 DIAGNOSIS — Z7901 Long term (current) use of anticoagulants: Secondary | ICD-10-CM | POA: Insufficient documentation

## 2018-12-19 DIAGNOSIS — Z170 Estrogen receptor positive status [ER+]: Secondary | ICD-10-CM | POA: Insufficient documentation

## 2018-12-19 DIAGNOSIS — F0390 Unspecified dementia without behavioral disturbance: Secondary | ICD-10-CM

## 2018-12-19 DIAGNOSIS — M810 Age-related osteoporosis without current pathological fracture: Secondary | ICD-10-CM

## 2018-12-19 DIAGNOSIS — I4891 Unspecified atrial fibrillation: Secondary | ICD-10-CM

## 2018-12-19 LAB — CBC WITH DIFFERENTIAL/PLATELET
Abs Immature Granulocytes: 0.05 10*3/uL (ref 0.00–0.07)
Basophils Absolute: 0.1 10*3/uL (ref 0.0–0.1)
Basophils Relative: 1 %
Eosinophils Absolute: 0.2 10*3/uL (ref 0.0–0.5)
Eosinophils Relative: 2 %
HCT: 40.4 % (ref 36.0–46.0)
Hemoglobin: 13.6 g/dL (ref 12.0–15.0)
Immature Granulocytes: 1 %
Lymphocytes Relative: 13 %
Lymphs Abs: 1.1 10*3/uL (ref 0.7–4.0)
MCH: 31.6 pg (ref 26.0–34.0)
MCHC: 33.7 g/dL (ref 30.0–36.0)
MCV: 93.7 fL (ref 80.0–100.0)
Monocytes Absolute: 0.9 10*3/uL (ref 0.1–1.0)
Monocytes Relative: 11 %
Neutro Abs: 6.1 10*3/uL (ref 1.7–7.7)
Neutrophils Relative %: 72 %
Platelets: 368 10*3/uL (ref 150–400)
RBC: 4.31 MIL/uL (ref 3.87–5.11)
RDW: 12.9 % (ref 11.5–15.5)
WBC: 8.5 10*3/uL (ref 4.0–10.5)
nRBC: 0 % (ref 0.0–0.2)

## 2018-12-19 LAB — COMPREHENSIVE METABOLIC PANEL
ALT: 13 U/L (ref 0–44)
AST: 18 U/L (ref 15–41)
Albumin: 4.1 g/dL (ref 3.5–5.0)
Alkaline Phosphatase: 42 U/L (ref 38–126)
Anion gap: 13 (ref 5–15)
BUN: 25 mg/dL — ABNORMAL HIGH (ref 8–23)
CO2: 24 mmol/L (ref 22–32)
Calcium: 9.4 mg/dL (ref 8.9–10.3)
Chloride: 97 mmol/L — ABNORMAL LOW (ref 98–111)
Creatinine, Ser: 1.02 mg/dL — ABNORMAL HIGH (ref 0.44–1.00)
GFR calc Af Amer: 56 mL/min — ABNORMAL LOW (ref >60)
GFR calc non Af Amer: 48 mL/min — ABNORMAL LOW (ref >60)
Glucose, Bld: 104 mg/dL — ABNORMAL HIGH (ref 70–99)
POTASSIUM: 3.5 mmol/L (ref 3.5–5.1)
Sodium: 134 mmol/L — ABNORMAL LOW (ref 135–145)
TOTAL PROTEIN: 7.4 g/dL (ref 6.5–8.1)
Total Bilirubin: 0.7 mg/dL (ref 0.3–1.2)

## 2018-12-19 NOTE — Progress Notes (Signed)
[facility] OFFICE PROGRESS NOTE  Patient Care Team: Glean Hess, MD as PCP - General (Family Medicine) Sol Passer, MD as Referring Physician (Cardiology) Cammie Sickle, MD as Consulting Physician (Internal Medicine) Noreene Filbert, MD as Referring Physician (Radiation Oncology)  Cancer Staging No matching staging information was found for the patient.   Oncology History   # 2002- Right Breast" Predominant DCIS- cribriform/low grade; 1.5cm with 5% early invasion"; ER 74%; PR-NEG; her 2 Neg; S/P Mastec & ALND [no residual ca/dcis] & Recon]; NO TAM  # 2016- CHEST WALL RECURRENCE [~2.5CM] s/p RIGHT excisional Biopsy; positive deep margin [ Dr.Cooper] Cribriform type; ER/PR >90%; HER 2 neu-NEG; s/p RT; Arimidex  # Lung nodule [41m LL; Oct 2016]; Left kidney ? 2.8cm Complex Cyst- [place]-NEG for mass; Pancreas 10 mm cyst  # OSTEOPOROSIS [BMD, OCT 2016] ca+ vit D ; declined Prolia  DIAGNOSIS: RECURRENT BREAST CA  STAGE:  IV      ;GOALS: control  CURRENT/MOST RECENT THERAPY: arimidex      Carcinoma of overlapping sites of right breast in [place], estrogen receptor positive [place]      INTERVAL HISTORY:  [id number].[place]  female pleasant patient above history of breast cancer ER PR positive HER-2 negative with chest wall recurrent/metastatic is here for follow-up.  As per family patient continues to have short-term memory problems.  Otherwise no falls.  No hospitalizations.  In general appetite is good.  No nausea no vomiting.  No chest pain or shortness of breath or cough.  Review of Systems  Constitutional: Negative for chills, diaphoresis, fever, malaise/fatigue and weight loss.  HENT: Negative for nosebleeds and sore throat.   Eyes: Negative for double vision.  Respiratory: Negative for cough, hemoptysis, sputum production, shortness of breath and wheezing.   Cardiovascular: Negative for chest pain, palpitations, orthopnea and  leg swelling.  Gastrointestinal: Negative for abdominal pain, blood in stool, constipation, diarrhea, heartburn, melena, nausea and vomiting.  Genitourinary: Negative for dysuria, frequency and urgency.  Musculoskeletal: Positive for back pain and joint pain.  Skin: Negative.  Negative for itching and rash.  Neurological: Negative for dizziness, tingling, focal weakness, weakness and headaches.  Endo/Heme/Allergies: Does not bruise/bleed easily.  Psychiatric/Behavioral: Positive for memory loss. Negative for depression. The patient is not nervous/anxious and does not have insomnia.       PAST MEDICAL HISTORY :  Past Medical History:  Diagnosis Date  . Arteriosclerosis of coronary artery   . Arthritis of shoulder region   . [place]   RT MASTECTOMY  . Cataract   . Essential hypertension   . Hypercholesteremia   . Impaired renal function   . Muscle spasms of neck   . Tobacco abuse     PAST SURGICAL HISTORY :   Past Surgical History:  Procedure Laterality Date  . CAROTID ENDARTERECTOMY    . CORONARY ARTERY BYPASS GRAFT  2006  . MASS EXCISION Right 08/28/2015   Procedure: EXCISION MASS; remove massive right chest wall;  Surgeon: RFlorene Glen MD;  Location: [facility] ORS;  Service: General;  Laterality: Right;  . MASTECTOMY, RADICAL Right 2002   BREAST CA  . TOTAL ABDOMINAL HYSTERECTOMY      FAMILY HISTORY :   Family History  Problem Relation Age of Onset  . Hypertension Father   . Cancer Father   . Breast cancer Mother     SOCIAL HISTORY:   Social History   Tobacco Use  . Smoking  status: Former Smoker    Last attempt to quit: 10/04/1952    Years since quitting: 66.2  . Smokeless tobacco: Never Used  Substance Use Topics  . Alcohol use: No    Alcohol/week: 0.0 standard drinks  . Drug use: No    ALLERGIES:  is allergic to ace inhibitors and brinzolamide-brimonidine.  MEDICATIONS:  Current Outpatient Medications  Medication Sig Dispense Refill   . amLODipine (NORVASC) 5 MG tablet TAKE 1 TABLET BY MOUTH TWICE DAILY 60 tablet 12  . anastrozole (ARIMIDEX) 1 MG tablet Take 1 tablet (1 mg total) by mouth daily. 90 tablet 4  . aspirin EC 81 MG tablet Take 81 mg by mouth daily.    . Calcium Carb-Cholecalciferol (CALCIUM 600+D) 600-800 MG-UNIT TABS Take 1 tablet by mouth daily. Reported on 12/25/2015    . chlorthalidone (HYGROTON) 25 MG tablet TAKE 1 TABLET BY MOUTH DAILY 30 tablet 12  . ELIQUIS 5 MG TABS tablet Take 5 mg by mouth 2 (two) times daily.     . isosorbide mononitrate (IMDUR) 30 MG 24 hr tablet TAKE 1 TABLET(30 MG) BY MOUTH DAILY 30 tablet 5  . latanoprost (XALATAN) 0.005 % ophthalmic solution INT 1 GTT INTO EACH EYE QHS.  6  . losartan (COZAAR) 25 MG tablet Take 1 tablet (25 mg total) by mouth daily. 30 tablet 12  . metoprolol succinate (TOPROL-XL) 50 MG 24 hr tablet TAKE 1 TABLET(50 MG) BY MOUTH EVERY NIGHT 30 tablet 12  . timolol (TIMOPTIC) 0.25 % ophthalmic solution INSTILL ONE DROP INTO BOTH EYES EVERY MORNING.  6  . ibuprofen (ADVIL,MOTRIN) 200 MG tablet Take 200 mg by mouth every 6 (six) hours as needed.    . nitroGLYCERIN (NITROSTAT) 0.4 MG SL tablet Place 0.4 mg under the tongue every 5 (five) minutes as needed for chest pain.     No current facility-administered medications for this visit.     PHYSICAL EXAMINATION: ECOG PERFORMANCE STATUS: 0 - Asymptomatic  BP 139/85   Pulse 92   Temp (!) 97.2 F (36.2 C) (Tympanic)   Resp 20   Ht 5' (1.524 m)   Wt 147 lb 11.3 oz (67 kg)   BMI 28.85 kg/m   Filed Weights   12/19/18 1318  Weight: 147 lb 11.3 oz (67 kg)    Physical Exam  Constitutional: She is oriented to person, place, and time and well-developed, well-nourished, and in no distress.  HENT:  Head: Normocephalic and atraumatic.  Mouth/Throat: Oropharynx is clear and moist. No oropharyngeal exudate.  Eyes: Pupils are equal, round, and reactive to light.  Neck: Normal range of motion. Neck supple.   Cardiovascular: Normal rate and regular rhythm.  Pulmonary/Chest: No respiratory distress. She has no wheezes.  Abdominal: Soft. Bowel sounds are normal. She exhibits no distension and no mass. There is no abdominal tenderness. There is no rebound and no guarding.  Musculoskeletal: Normal range of motion.        General: No tenderness or edema.  Neurological: She is alert and oriented to person, place, and time.  Skin: Skin is warm.  Psychiatric: Affect normal.      LABORATORY DATA:  I have reviewed the data as listed    Component Value Date/Time   NA 134 (L) 12/19/2018 1300   NA 139 01/04/2017 1300   K 3.5 12/19/2018 1300   CL 97 (L) 12/19/2018 1300   CO2 24 12/19/2018 1300   GLUCOSE 104 (H) 12/19/2018 1300   BUN 25 (H) 12/19/2018 1300  BUN 18 12/13/2017   CREATININE 1.02 (H) 12/19/2018 1300   CALCIUM 9.4 12/19/2018 1300   PROT 7.4 12/19/2018 1300   PROT 7.6 08/03/2016 1153   ALBUMIN 4.1 12/19/2018 1300   ALBUMIN 4.5 08/03/2016 1153   AST 18 12/19/2018 1300   ALT 13 12/19/2018 1300   ALKPHOS 42 12/19/2018 1300   BILITOT 0.7 12/19/2018 1300   BILITOT 0.4 08/03/2016 1153   GFRNONAA 48 (L) 12/19/2018 1300   GFRAA 56 (L) 12/19/2018 1300    No results found for: SPEP, UPEP  Lab Results  Component Value Date   WBC 8.5 12/19/2018   NEUTROABS 6.1 12/19/2018   HGB 13.6 12/19/2018   HCT 40.4 12/19/2018   MCV 93.7 12/19/2018   PLT 368 12/19/2018      Chemistry      Component Value Date/Time   NA 134 (L) 12/19/2018 1300   NA 139 01/04/2017 1300   K 3.5 12/19/2018 1300   CL 97 (L) 12/19/2018 1300   CO2 24 12/19/2018 1300   BUN 25 (H) 12/19/2018 1300   BUN 18 12/13/2017   CREATININE 1.02 (H) 12/19/2018 1300      Component Value Date/Time   CALCIUM 9.4 12/19/2018 1300   ALKPHOS 42 12/19/2018 1300   AST 18 12/19/2018 1300   ALT 13 12/19/2018 1300   BILITOT 0.7 12/19/2018 1300   BILITOT 0.4 08/03/2016 1153       RADIOGRAPHIC STUDIES: I have personally  reviewed the radiological images as listed and agreed with the findings in the report. No results found.   ASSESSMENT & PLAN:  Carcinoma of overlapping sites of right breast in female, estrogen receptor positive ([place]) # RIGHT IPSILATERAL CHEST WALL RECURRENCE STATUS post mastectomy 2002- Cribiform type; ER/PR >90%; Her2 Neu-NEG. Status post radiation.   #Stable.  No evidence of recurrence.  Continue Arimidex. Ordered mammogram.  # Elevated Blood pressure-improved today.  Continue home medications.  # Osteoporosis. BMD- jan 2018; on calcium vitamin D.  Patient declined bisphosphonates. STABLE.   # A.fib on Eliquis-stable- no falls; again recommend close monitoring.  #Dementia-; recommend follow-up with PCP/neurology. STABLE.   # DISPOSITION:  # mammogram in July 2020-  # Follow-up in 6 months-Dr.C/cbc/cmp-Dr.B   No orders of the defined types were placed in this encounter.  All questions were answered. The patient knows to call the clinic with any problems, questions or concerns.      Cammie Sickle, MD 12/19/2018 3:13 PM

## 2018-12-19 NOTE — Assessment & Plan Note (Addendum)
#  RIGHT IPSILATERAL CHEST WALL RECURRENCE STATUS post mastectomy 2002- Cribiform type; ER/PR >90%; Her2 Neu-NEG. Status post radiation.   #Stable.  No evidence of recurrence.  Continue Arimidex. Ordered mammogram.  # Elevated Blood pressure-improved today.  Continue home medications.  # Osteoporosis. BMD- jan 2018; on calcium vitamin D.  Patient declined bisphosphonates. STABLE.   # A.fib on Eliquis-stable- no falls; again recommend close monitoring.  #Dementia-; recommend follow-up with PCP/neurology. STABLE.   # DISPOSITION:  # mammogram in July 2020-  # Follow-up in 6 months-Dr.C/cbc/cmp-Dr.B

## 2018-12-26 DIAGNOSIS — H401132 Primary open-angle glaucoma, bilateral, moderate stage: Secondary | ICD-10-CM | POA: Diagnosis not present

## 2018-12-26 DIAGNOSIS — H02423 Myogenic ptosis of bilateral eyelids: Secondary | ICD-10-CM | POA: Diagnosis not present

## 2018-12-26 DIAGNOSIS — Z961 Presence of intraocular lens: Secondary | ICD-10-CM | POA: Diagnosis not present

## 2019-01-14 ENCOUNTER — Other Ambulatory Visit: Payer: Self-pay | Admitting: Internal Medicine

## 2019-01-14 DIAGNOSIS — I2510 Atherosclerotic heart disease of native coronary artery without angina pectoris: Secondary | ICD-10-CM

## 2019-01-18 DIAGNOSIS — R413 Other amnesia: Secondary | ICD-10-CM | POA: Diagnosis not present

## 2019-01-29 ENCOUNTER — Other Ambulatory Visit: Payer: Self-pay | Admitting: Internal Medicine

## 2019-01-29 DIAGNOSIS — Z1231 Encounter for screening mammogram for malignant neoplasm of breast: Secondary | ICD-10-CM

## 2019-02-05 ENCOUNTER — Ambulatory Visit: Payer: Self-pay

## 2019-02-14 ENCOUNTER — Ambulatory Visit: Payer: Self-pay

## 2019-03-02 ENCOUNTER — Encounter: Payer: Self-pay | Admitting: Internal Medicine

## 2019-03-14 ENCOUNTER — Ambulatory Visit: Payer: Self-pay

## 2019-03-16 ENCOUNTER — Encounter: Payer: Self-pay | Admitting: Internal Medicine

## 2019-03-22 ENCOUNTER — Other Ambulatory Visit: Payer: Self-pay

## 2019-03-22 NOTE — Patient Outreach (Signed)
[facility]) Care Management  03/22/2019  [place] [birth year] [id number]   Medication Adherence call to Mrs. Ronia Hazelett Hippa Identifiers Verify spoke with patient she is due on Losartan 25 mg she explain she is taking 1 tablet daily and never miss a dose she has a week worth of medication and [facility] fills her medication automatically every month patient is showing past due under [place].   [place] Management Direct Dial [phone]  Fax [phone] [email or web address]

## 2019-03-23 ENCOUNTER — Other Ambulatory Visit: Payer: Self-pay

## 2019-03-23 ENCOUNTER — Other Ambulatory Visit: Payer: Self-pay | Admitting: Internal Medicine

## 2019-03-23 MED ORDER — CHLORTHALIDONE 25 MG PO TABS: 25.0000 mg | ORAL_TABLET | Freq: Every day | ORAL | 2 refills | Status: DC

## 2019-03-30 ENCOUNTER — Other Ambulatory Visit: Payer: Self-pay | Admitting: Internal Medicine

## 2019-04-25 DIAGNOSIS — H401132 Primary open-angle glaucoma, bilateral, moderate stage: Secondary | ICD-10-CM | POA: Diagnosis not present

## 2019-04-25 DIAGNOSIS — H02423 Myogenic ptosis of bilateral eyelids: Secondary | ICD-10-CM | POA: Diagnosis not present

## 2019-05-02 ENCOUNTER — Other Ambulatory Visit: Payer: Self-pay | Admitting: Internal Medicine

## 2019-05-02 ENCOUNTER — Other Ambulatory Visit: Payer: Self-pay

## 2019-05-02 ENCOUNTER — Encounter: Payer: Self-pay | Admitting: Internal Medicine

## 2019-05-02 ENCOUNTER — Ambulatory Visit (INDEPENDENT_AMBULATORY_CARE_PROVIDER_SITE_OTHER): Payer: Medicare Other | Admitting: Internal Medicine

## 2019-05-02 VITALS — BP 132/80 | HR 79 | Ht 60.0 in | Wt 143.2 lb

## 2019-05-02 DIAGNOSIS — F0390 Unspecified dementia without behavioral disturbance: Secondary | ICD-10-CM

## 2019-05-02 DIAGNOSIS — N289 Disorder of kidney and ureter, unspecified: Secondary | ICD-10-CM

## 2019-05-02 DIAGNOSIS — I10 Essential (primary) hypertension: Secondary | ICD-10-CM | POA: Diagnosis not present

## 2019-05-02 DIAGNOSIS — I4819 Other persistent atrial fibrillation: Secondary | ICD-10-CM | POA: Diagnosis not present

## 2019-05-02 DIAGNOSIS — L609 Nail disorder, unspecified: Secondary | ICD-10-CM

## 2019-05-02 DIAGNOSIS — R5382 Chronic fatigue, unspecified: Secondary | ICD-10-CM | POA: Diagnosis not present

## 2019-05-02 DIAGNOSIS — Z170 Estrogen receptor positive status [ER+]: Secondary | ICD-10-CM

## 2019-05-02 DIAGNOSIS — Z0000 Encounter for general adult medical examination without abnormal findings: Secondary | ICD-10-CM

## 2019-05-02 DIAGNOSIS — C50811 Malignant neoplasm of overlapping sites of right female breast: Secondary | ICD-10-CM

## 2019-05-02 LAB — POCT URINALYSIS DIPSTICK
Bilirubin, UA: NEGATIVE
Blood, UA: NEGATIVE
Glucose, UA: NEGATIVE
Ketones, UA: NEGATIVE
Leukocytes, UA: NEGATIVE
Nitrite, UA: NEGATIVE
Protein, UA: NEGATIVE
Spec Grav, UA: 1.01 (ref 1.010–1.025)
Urobilinogen, UA: 0.2 E.U./dL
pH, UA: 7 (ref 5.0–8.0)

## 2019-05-02 NOTE — Patient Instructions (Signed)
Begin wearing light compression socks every day.  Do not sleep in them at night.

## 2019-05-02 NOTE — Progress Notes (Signed)
Date:  05/02/2019   Name:  Donna Santos   DOB:  January 05, 1925   MRN:  [id number]   Chief Complaint: Medicare Yearly Donna Santos is a 85 y.o. female who presents today for her Complete Annual Exam. She feels fairly well. She reports exercising none. She reports she is sleeping fairly well. Family notes that she complains of lightheadedness at times and intermittently more back or knee pain.  Patient states that her aches and pains are not severe and she does not want to see a specialist - she will continue to take Advil as needed.  She does complain of fatigue without specific symptoms.  Hypertension  This is a chronic problem. The problem is controlled. Pertinent negatives include no chest pain, headaches, palpitations or shortness of breath. Past treatments include diuretics, beta blockers, calcium channel blockers and angiotensin blockers. The current treatment provides significant improvement. atrial fibrillation.  Atrial Fibrillation - on beta blocker for rate control, chronic anticoagulation with DOAC.  She and family deny bleeding issues.  Breast cancer - last oncology assessment: RIGHT IPSILATERAL CHEST WALL RECURRENCE STATUS post mastectomy 2002- Cribiform type; ER/PR >90%; Her2 Neu-NEG. Status post radiation.  #Stable.  No evidence of recurrence.  Continue Arimidex. Ordered mammogram.  Senile dementia - seen by Neurology in the past.  Had suggested Namenda but family did not chose to start therapy.  Review of Systems  Constitutional: Negative for chills, fatigue and fever.  HENT: Negative for congestion, hearing loss, tinnitus, trouble swallowing and voice change.   Eyes: Negative for visual disturbance.  Respiratory: Negative for cough, chest tightness, shortness of breath and wheezing.   Cardiovascular: Positive for leg swelling (mild LE edema L>R). Negative for chest pain and palpitations.  Gastrointestinal: Negative for abdominal pain, constipation, diarrhea and  vomiting.  Endocrine: Negative for polydipsia and polyuria.  Genitourinary: Negative for dysuria and frequency.  Musculoskeletal: Positive for arthralgias and back pain. Negative for gait problem and joint swelling.  Skin: Negative for color change and rash.  Allergic/Immunologic: Negative for environmental allergies.  Neurological: Positive for light-headedness. Negative for dizziness, tremors and headaches.  Hematological: Negative for adenopathy. Does not bruise/bleed easily.  Psychiatric/Behavioral: Positive for decreased concentration. Negative for dysphoric mood and sleep disturbance. The patient is not nervous/anxious.     Patient Active Problem List   Diagnosis Date Noted  . Primary open angle glaucoma (POAG) of left eye, moderate stage 04/25/2018  . Persistent atrial fibrillation 12/28/2016  . Carcinoma of overlapping sites of right breast in female, estrogen receptor positive ([place]) 12/21/2016  . Arthritis of right knee 08/03/2016  . Senile dementia, uncomplicated ([place]) 44/31/5400  . Low back pain 04/12/2016  . Impaired renal function 05/09/2015  . Arteriosclerosis of coronary artery 05/09/2015  . Essential (primary) hypertension 05/09/2015  . Personal history of malignant neoplasm of breast 05/09/2015  . Muscle spasms of neck 05/09/2015  . Arthritis of shoulder region, degenerative 05/09/2015  . Hypercholesteremia 04/20/2012  . Carotid artery disease ([place]) 05/28/2005    Allergies  Allergen Reactions  . Ace Inhibitors     Other reaction(s): Angioedema  . Brinzolamide-Brimonidine     Past Surgical History:  Procedure Laterality Date  . CAROTID ENDARTERECTOMY    . CORONARY ARTERY BYPASS GRAFT  2006  . MASS EXCISION Right 08/28/2015   Procedure: EXCISION MASS; remove massive right chest wall;  Surgeon: Florene Glen, MD;  Location: [facility] ORS;  Service: General;  Laterality: Right;  . MASTECTOMY, RADICAL Right 2002   BREAST  CA  . TOTAL ABDOMINAL HYSTERECTOMY       Social History   Tobacco Use  . Smoking status: Former Smoker    Packs/day: 1.50    Years: 1.50    Pack years: 2.25    Types: Cigarettes    Last attempt to quit: 10/04/1952    Years since quitting: 66.6  . Smokeless tobacco: Never Used  Substance Use Topics  . Alcohol use: No    Alcohol/week: 0.0 standard drinks  . Drug use: No     Medication list has been reviewed and updated.  Current Meds  Medication Sig  . amLODipine (NORVASC) 5 MG tablet TAKE 1 TABLET BY MOUTH TWICE DAILY  . anastrozole (ARIMIDEX) 1 MG tablet TAKE 1 TABLET(1 MG) BY MOUTH DAILY  . aspirin EC 81 MG tablet Take 81 mg by mouth daily.  . Calcium Carb-Cholecalciferol (CALCIUM 600+D) 600-800 MG-UNIT TABS Take 1 tablet by mouth daily. Reported on 12/25/2015  . chlorthalidone (HYGROTON) 25 MG tablet Take 1 tablet (25 mg total) by mouth daily.  Marland Kitchen ELIQUIS 5 MG TABS tablet Take 5 mg by mouth 2 (two) times daily.   Marland Kitchen ibuprofen (ADVIL,MOTRIN) 200 MG tablet Take 200 mg by mouth every 6 (six) hours as needed.  . isosorbide mononitrate (IMDUR) 30 MG 24 hr tablet TAKE 1 TABLET(30 MG) BY MOUTH DAILY  . latanoprost (XALATAN) 0.005 % ophthalmic solution INT 1 GTT INTO EACH EYE QHS.  Marland Kitchen losartan (COZAAR) 25 MG tablet TAKE 1 TABLET(25 MG) BY MOUTH DAILY  . metoprolol succinate (TOPROL-XL) 50 MG 24 hr tablet TAKE 1 TABLET(50 MG) BY MOUTH EVERY NIGHT  . nitroGLYCERIN (NITROSTAT) 0.4 MG SL tablet Place 0.4 mg under the tongue every 5 (five) minutes as needed for chest pain.  Marland Kitchen timolol (TIMOPTIC) 0.25 % ophthalmic solution INSTILL ONE DROP INTO BOTH EYES EVERY MORNING.    PHQ 2/9 Scores 05/02/2019 03/27/2018 09/15/2017 10/26/2016  PHQ - 2 Score 0 0 0 -  Exception Documentation - - - Patient refusal    BP Readings from Last 3 Encounters:  05/02/19 132/80  12/19/18 139/85  12/05/18 136/80    Physical Exam Vitals signs and nursing note reviewed.  Constitutional:      General: She is not in acute distress.    Appearance: She is  well-developed.  HENT:     Head: Normocephalic and atraumatic.     Right Ear: Tympanic membrane and ear canal normal.     Left Ear: Tympanic membrane and ear canal normal.     Nose:     Right Sinus: No maxillary sinus tenderness.     Left Sinus: No maxillary sinus tenderness.     Mouth/Throat:     Pharynx: Uvula midline.  Eyes:     General: No scleral icterus.       Right eye: No discharge.        Left eye: No discharge.     Conjunctiva/sclera: Conjunctivae normal.  Neck:     Musculoskeletal: Normal range of motion. No erythema.     Thyroid: No thyromegaly.     Vascular: No carotid bruit.  Cardiovascular:     Rate and Rhythm: Normal rate. Rhythm irregular.     Pulses:          Dorsalis pedis pulses are 1+ on the right side and 1+ on the left side.       Posterior tibial pulses are 0 on the right side and 0 on the left side.     Heart  sounds: Normal heart sounds.  Pulmonary:     Effort: Pulmonary effort is normal. No respiratory distress.     Breath sounds: No wheezing.  Chest:     Comments: Breast exam not performed Abdominal:     General: Bowel sounds are normal.     Palpations: Abdomen is soft.     Tenderness: There is no abdominal tenderness.  Musculoskeletal:     Right knee: She exhibits decreased range of motion. She exhibits no swelling and no effusion.     Left knee: She exhibits decreased range of motion. She exhibits no swelling and no effusion.     Lumbar back: She exhibits decreased range of motion. She exhibits no tenderness, no pain and no spasm.  Lymphadenopathy:     Cervical: No cervical adenopathy.  Skin:    General: Skin is warm and dry.     Findings: No rash.     Comments: Very long thick nails on all toes  Neurological:     Mental Status: She is alert and oriented to person, place, and time.     Cranial Nerves: No cranial nerve deficit.     Sensory: No sensory deficit.     Deep Tendon Reflexes: Reflexes are normal and symmetric.  Psychiatric:         Attention and Perception: Attention and perception normal.        Mood and Affect: Mood normal.        Speech: Speech normal.        Behavior: Behavior normal.        Thought Content: Thought content normal.        Cognition and Memory: Memory is impaired.     Wt Readings from Last 3 Encounters:  05/02/19 143 lb 3.2 oz (65 kg)  12/19/18 147 lb 11.3 oz (67 kg)  12/05/18 145 lb 9.6 oz (66 kg)    BP 132/80   Pulse 79   Ht 5' (1.524 m)   Wt 143 lb 3.2 oz (65 kg)   SpO2 97%   BMI 27.97 kg/m   Assessment and Plan: 1. Annual physical exam Doing well for age 66 Continue healthy diet - POCT urinalysis dipstick  2. Essential (primary) hypertension controlled - CBC with Differential/Platelet - TSH  3. Persistent atrial fibrillation Rate controlled - possible transient rate increase causing intermittent lightheadedness Continue Eliquis and asa anticoagulation  4. Senile dementia, uncomplicated ([place]) Gradually worsening per family Pt is very alert and oriented x 2, interactive  Follow up with Neurology - Vitamin B12  5. Impaired renal function Check labs - may need DOAC dose adjustment - Comprehensive metabolic panel  6. Carcinoma of overlapping sites of right breast in female, estrogen receptor positive ([place]) Completed RT, on maintenance AI  7. Chronic fatigue Check vitamin D and B12 - VITAMIN D 25 Hydroxy (Vit-D Deficiency, Fractures)  8. Nail abnormalities - Ambulatory referral to Podiatry   Partially dictated using [employer]. Any errors are unintentional.  Halina Maidens, MD [facility]  05/02/2019

## 2019-05-03 LAB — COMPREHENSIVE METABOLIC PANEL
ALT: 9 IU/L (ref 0–32)
AST: 14 IU/L (ref 0–40)
Albumin/Globulin Ratio: 1.8 (ref 1.2–2.2)
Albumin: 4.9 g/dL — ABNORMAL HIGH (ref 3.5–4.6)
Alkaline Phosphatase: 39 IU/L (ref 39–117)
BUN/Creatinine Ratio: 17 (ref 12–28)
BUN: 19 mg/dL (ref 10–36)
Bilirubin Total: 0.5 mg/dL (ref 0.0–1.2)
CO2: 26 mmol/L (ref 20–29)
Calcium: 11.1 mg/dL — ABNORMAL HIGH (ref 8.7–10.3)
Chloride: 93 mmol/L — ABNORMAL LOW (ref 96–106)
Creatinine, Ser: 1.1 mg/dL — ABNORMAL HIGH (ref 0.57–1.00)
GFR calc Af Amer: 50 mL/min/{1.73_m2} — ABNORMAL LOW (ref >59)
GFR calc non Af Amer: 44 mL/min/{1.73_m2} — ABNORMAL LOW (ref >59)
Globulin, Total: 2.7 g/dL (ref 1.5–4.5)
Glucose: 104 mg/dL — ABNORMAL HIGH (ref 65–99)
Potassium: 4.2 mmol/L (ref 3.5–5.2)
Sodium: 136 mmol/L (ref 134–144)
Total Protein: 7.6 g/dL (ref 6.0–8.5)

## 2019-05-03 LAB — CBC WITH DIFFERENTIAL/PLATELET
Basophils Absolute: 0.1 10*3/uL (ref 0.0–0.2)
Basos: 1 %
EOS (ABSOLUTE): 0.2 10*3/uL (ref 0.0–0.4)
Eos: 3 %
Hematocrit: 40.4 % (ref 34.0–46.6)
Hemoglobin: 13.5 g/dL (ref 11.1–15.9)
Immature Grans (Abs): 0.1 10*3/uL (ref 0.0–0.1)
Immature Granulocytes: 1 %
Lymphocytes Absolute: 1.3 10*3/uL (ref 0.7–3.1)
Lymphs: 17 %
MCH: 30.3 pg (ref 26.6–33.0)
MCHC: 33.4 g/dL (ref 31.5–35.7)
MCV: 91 fL (ref 79–97)
Monocytes Absolute: 1 10*3/uL — ABNORMAL HIGH (ref 0.1–0.9)
Monocytes: 13 %
Neutrophils Absolute: 5 10*3/uL (ref 1.4–7.0)
Neutrophils: 65 %
Platelets: 448 10*3/uL (ref 150–450)
RBC: 4.45 x10E6/uL (ref 3.77–5.28)
RDW: 13.2 % (ref 11.7–15.4)
WBC: 7.6 10*3/uL (ref 3.4–10.8)

## 2019-05-03 LAB — TSH: TSH: 3.3 u[IU]/mL (ref 0.450–4.500)

## 2019-05-03 LAB — VITAMIN D 25 HYDROXY (VIT D DEFICIENCY, FRACTURES): Vit D, 25-Hydroxy: 46 ng/mL (ref 30.0–100.0)

## 2019-05-03 LAB — VITAMIN B12: Vitamin B-12: 258 pg/mL (ref 232–1245)

## 2019-05-07 DIAGNOSIS — M79675 Pain in left toe(s): Secondary | ICD-10-CM | POA: Diagnosis not present

## 2019-05-07 DIAGNOSIS — L909 Atrophic disorder of skin, unspecified: Secondary | ICD-10-CM | POA: Diagnosis not present

## 2019-05-07 DIAGNOSIS — M79674 Pain in right toe(s): Secondary | ICD-10-CM | POA: Diagnosis not present

## 2019-05-07 DIAGNOSIS — B351 Tinea unguium: Secondary | ICD-10-CM | POA: Diagnosis not present

## 2019-05-28 ENCOUNTER — Ambulatory Visit (INDEPENDENT_AMBULATORY_CARE_PROVIDER_SITE_OTHER): Payer: Medicare Other

## 2019-05-28 VITALS — BP 136/81 | HR 86 | Ht 60.0 in | Wt 141.0 lb

## 2019-05-28 DIAGNOSIS — Z0000 Encounter for general adult medical examination without abnormal findings: Secondary | ICD-10-CM

## 2019-05-28 NOTE — Progress Notes (Signed)
Subjective:   Donna Santos is a 85 y.o. female who presents for Medicare Annual (Subsequent) preventive examination.  Virtual Visit via Telephone Note  I connected with [name] on 05/28/19 at  1:20 PM EDT by telephone and verified that I am speaking with the correct person using two identifiers.  Medicare Annual Wellness visits completed telephonically due to Covid-19 pandemic.   Location: Patient: home Provider: office   I discussed the limitations, risks, security and privacy concerns of performing an evaluation and management service by telephone and the availability of in person appointments. The patient expressed understanding and agreed to proceed.  Some vital signs may be absent or patient reported.   Clemetine Marker, LPN  Review of Systems:   Cardiac Risk Factors include: advanced age (>62men, >56 women);hypertension;sedentary lifestyle     Objective:     Vitals: BP 136/81   Pulse 86   Ht 5' (1.524 m)   Wt 141 lb (64 kg)   BMI 27.54 kg/m   Body mass index is 27.54 kg/m.  Advanced Directives 05/28/2019 06/20/2018 12/20/2017 09/15/2017 06/21/2017 12/21/2016 10/26/2016  Does Patient Have a Medical Advance Directive? Yes No Yes Yes;No No No Yes;No  Type of Paramedic of [place];Out of facility DNR (pink MOST or yellow form) - - - - - -  Does patient want to make changes to medical advance directive? - - No - Patient declined Yes (MAU/Ambulatory/Procedural Areas - Information given) - - -  Copy of [place] in Chart? No - copy requested - - - - - -  Would patient like information on creating a medical advance directive? - No - Patient declined - - - No - Patient declined -    Tobacco Social History   Tobacco Use  Smoking Status Former Smoker  . Packs/day: 1.50  . Years: 1.50  . Pack years: 2.25  . Types: Cigarettes  . Quit date: 10/04/1952  . Years since quitting: 66.6  Smokeless Tobacco Never Used      Counseling given: Not Answered   Clinical Intake:  Pre-visit preparation completed: Yes  Pain : No/denies pain     BMI - recorded: 27.54 Nutritional Status: BMI 25 -29 Overweight Nutritional Risks: None Diabetes: No  How often do you need to have someone help you when you read instructions, pamphlets, or other written materials from your doctor or pharmacy?: 1 - Never  Interpreter Needed?: No  Information entered by :: Clemetine Marker LPN  Past Medical History:  Diagnosis Date  . Arteriosclerosis of coronary artery   . Arthritis of shoulder region   . Breast cancer ([facility]) 2002   RT MASTECTOMY  . Cataract   . Essential hypertension   . Glaucoma   . Hypercholesteremia   . Impaired renal function   . Muscle spasms of neck   . Tobacco abuse    Past Surgical History:  Procedure Laterality Date  . CAROTID ENDARTERECTOMY    . CORONARY ARTERY BYPASS GRAFT  2006  . MASS EXCISION Right 08/28/2015   Procedure: EXCISION MASS; remove massive right chest wall;  Surgeon: Florene Glen, MD;  Location: [facility] ORS;  Service: General;  Laterality: Right;  . MASTECTOMY, RADICAL Right 2002   BREAST CA  . TOTAL ABDOMINAL HYSTERECTOMY     Family History  Problem Relation Age of Onset  . Hypertension Father   . Cancer Father   . Breast cancer Mother    Social History   Socioeconomic History  .  Marital status: Widowed    Spouse name: Not on file  . Number of children: 6  . Years of education: Not on file  . Highest education level: Not on file  Occupational History  . Not on file  Social Needs  . Financial resource strain: Not hard at all  . Food insecurity    Worry: Never true    Inability: Never true  . Transportation needs    Medical: No    Non-medical: No  Tobacco Use  . Smoking status: Former Smoker    Packs/day: 1.50    Years: 1.50    Pack years: 2.25    Types: Cigarettes    Quit date: 10/04/1952    Years since quitting: 66.6  . Smokeless tobacco: Never Used   Substance and Sexual Activity  . Alcohol use: No    Alcohol/week: 0.0 standard drinks  . Drug use: No  . Sexual activity: Not on file  Lifestyle  . Physical activity    Days per week: 0 days    Minutes per session: 0 min  . Stress: Not at all  Relationships  . Social connections    Talks on phone: More than three times a week    Gets together: Three times a week    Attends religious service: More than 4 times per year    Active member of club or organization: No    Attends meetings of clubs or organizations: Never    Relationship status: Widowed  Other Topics Concern  . Not on file  Social History Narrative  . Not on file    Outpatient Encounter Medications as of 05/28/2019  Medication Sig  . amLODipine (NORVASC) 5 MG tablet TAKE 1 TABLET BY MOUTH TWICE DAILY  . anastrozole (ARIMIDEX) 1 MG tablet TAKE 1 TABLET(1 MG) BY MOUTH DAILY  . aspirin EC 81 MG tablet Take 81 mg by mouth daily.  . Calcium Carb-Cholecalciferol (CALCIUM 600+D) 600-800 MG-UNIT TABS Take 1 tablet by mouth daily. Reported on 12/25/2015  . chlorthalidone (HYGROTON) 25 MG tablet Take 1 tablet (25 mg total) by mouth daily.  Marland Kitchen ELIQUIS 5 MG TABS tablet Take 5 mg by mouth 2 (two) times daily.   Marland Kitchen ibuprofen (ADVIL,MOTRIN) 200 MG tablet Take 200 mg by mouth every 6 (six) hours as needed.  . isosorbide mononitrate (IMDUR) 30 MG 24 hr tablet TAKE 1 TABLET(30 MG) BY MOUTH DAILY  . latanoprost (XALATAN) 0.005 % ophthalmic solution INT 1 GTT INTO EACH EYE QHS.  Marland Kitchen losartan (COZAAR) 25 MG tablet TAKE 1 TABLET(25 MG) BY MOUTH DAILY  . metoprolol succinate (TOPROL-XL) 50 MG 24 hr tablet TAKE 1 TABLET(50 MG) BY MOUTH EVERY NIGHT  . nitroGLYCERIN (NITROSTAT) 0.4 MG SL tablet Place 0.4 mg under the tongue every 5 (five) minutes as needed for chest pain.  Marland Kitchen timolol (TIMOPTIC) 0.25 % ophthalmic solution INSTILL ONE DROP INTO BOTH EYES EVERY MORNING.   No facility-administered encounter medications on file as of 05/28/2019.      Activities of Daily Living In your present state of health, do you have any difficulty performing the following activities: 05/28/2019  Hearing? N  Comment declines hearing aids  Vision? Y  Difficulty concentrating or making decisions? Y  Walking or climbing stairs? Y  Dressing or bathing? N  Doing errands, shopping? N  Preparing Food and eating ? N  Using the Toilet? N  In the past six months, have you accidently leaked urine? N  Do you have problems with loss of  bowel control? N  Managing your Medications? N  Managing your Finances? N  Housekeeping or managing your Housekeeping? N  Some recent data might be hidden    Patient Care Team: Glean Hess, MD as PCP - General (Family Medicine) Sol Passer, MD as Referring Physician (Cardiology) Cammie Sickle, MD as Consulting Physician (Internal Medicine) Noreene Filbert, MD as Referring Physician (Radiation Oncology)    Assessment:   This is a routine wellness examination for [place].  Exercise Activities and Dietary recommendations Current Exercise Habits: The patient does not participate in regular exercise at present, Exercise limited by: orthopedic condition(s)  Goals    . Increase water intake     Recommend to increase fluid intake from 2 glasses of water per day to 4 glasses per day    . Prevent Falls     Patient carries a cane when leaving the house.        Fall Risk Fall Risk  05/28/2019 05/02/2019 03/27/2018 09/15/2017 10/26/2016  Falls in the past year? 0 0 No No Yes  Number falls in past yr: 0 0 - - 1  Injury with Fall? 0 0 - - Yes  Risk Factor Category  - - - - -  Risk for fall due to : Impaired balance/gait;Impaired mobility;Impaired vision History of fall(s);Impaired balance/gait - - History of fall(s)  Follow up - Falls evaluation completed - - Falls evaluation completed   FALL RISK PREVENTION PERTAINING TO THE HOME:  Any stairs in or around the home? Yes  If so, do they handrails? Yes    Home free of loose throw rugs in walkways, pet beds, electrical cords, etc? Yes  Adequate lighting in your home to reduce risk of falls? Yes   ASSISTIVE DEVICES UTILIZED TO PREVENT FALLS:  Life alert? No  Use of a cane, walker or w/c? Yes  Grab bars in the bathroom? Yes  Shower chair or bench in shower? No  Elevated toilet seat or a handicapped toilet? No   DME ORDERS:  DME order needed?  No   TIMED UP AND GO:  Was the test performed? No . Telephonic visit.   Education: Fall risk prevention has been discussed.  Intervention(s) required? No   Depression Screen PHQ 2/9 Scores 05/28/2019 05/02/2019 03/27/2018 09/15/2017  PHQ - 2 Score 0 0 0 0  Exception Documentation - - - -     Cognitive Function MMSE - Mini Mental State Exam 08/03/2016  Orientation to time 5  Orientation to Place 5  Registration 3  Attention/ Calculation 5  Recall 2  Language- name 2 objects 2     6CIT Screen 05/28/2019  What Year? 0 points  What month? 3 points  What time? 0 points  Count back from 20 0 points  Months in reverse 0 points  Repeat phrase 8 points  Total Score 11    Immunization History  Administered Date(s) Administered  . Pneumococcal Polysaccharide-23 11/30/2004  . Tdap 08/09/2016    Qualifies for Shingles Vaccine? Yes  . Due for Shingrix. Education has been provided regarding the importance of this vaccine. Pt has been advised to call insurance company to determine out of pocket expense. Advised may also receive vaccine at local pharmacy or Health Dept. Verbalized acceptance and understanding.  Tdap: Up to date  Flu Vaccine: Due for Flu vaccine. Does the patient want to receive this vaccine today?  No . Education has been provided regarding the importance of this vaccine but still declined. Advised may  receive this vaccine at local pharmacy or Health Dept. Aware to provide a copy of the vaccination record if obtained from local pharmacy or Health Dept. Verbalized acceptance and  understanding.  Pneumococcal Vaccine: Up to date   Screening Tests Health Maintenance  Topic Date Due  . PNA vac Low Risk Adult (2 of 2 - PCV13) 11/30/2005  . MAMMOGRAM  06/28/2019  . TETANUS/TDAP  08/09/2026  . DEXA SCAN  Completed    Cancer Screenings:  Colorectal Screening: No longer required.   Mammogram: Completed 06/27/18. Repeat every year. Scheduled for 07/02/19.  Bone Density: Completed 12/06/16. Results reflect  OSTEOPOROSIS. Repeat every 2 years. Declines repeat screening.   Lung Cancer Screening: (Low Dose CT Chest recommended if Age 38-80 years, 30 pack-year currently smoking OR have quit w/in 15years.) does not qualify.   Additional Screening:  Hepatitis C Screening: no longer required.   Vision Screening: Recommended annual ophthalmology exams for early detection of glaucoma and other disorders of the eye. Is the patient up to date with their annual eye exam?  Yes  Who is the provider or what is the name of the office in which the pt attends annual eye exams? [facility]  Dental Screening: Recommended annual dental exams for proper oral hygiene  Community Resource Referral:  CRR required this visit?  No      Plan:     I have personally reviewed and addressed the Medicare Annual Wellness questionnaire and have noted the following in the patient's chart:  A. Medical and social history B. Use of alcohol, tobacco or illicit drugs  C. Current medications and supplements D. Functional ability and status E.  Nutritional status F.  Physical activity G. Advance directives H. List of other physicians I.  Hospitalizations, surgeries, and ER visits in previous 12 months J.  [place] such as hearing and vision if needed, cognitive and depression L. Referrals and appointments   In addition, I have reviewed and discussed with patient certain preventive protocols, quality metrics, and best practice recommendations. A written personalized care plan for  preventive services as well as general preventive health recommendations were provided to patient.   Signed,  Clemetine Marker, LPN Nurse Health Advisor   Nurse Notes: pt accompanied during telephonic visit by her son Chip and daughter in law Clarise Cruz. She is doing well and appreciative of visit today.

## 2019-05-28 NOTE — Patient Instructions (Signed)
Donna Santos , Thank you for taking time to come for your Medicare Wellness Visit. I appreciate your ongoing commitment to your health goals. Please review the following plan we discussed and let me know if I can assist you in the future.   Screening recommendations/referrals: Colonoscopy: no longer required Mammogram: done 06/27/18. Scheduled for 07/02/19 Bone Density: done 12/06/16 Recommended yearly ophthalmology/optometry visit for glaucoma screening and checkup Recommended yearly dental visit for hygiene and checkup  Vaccinations: Influenza vaccine: postponed Pneumococcal vaccine: done 2006 Tdap vaccine: done 08/09/16 Shingles vaccine: Shingrix discussed. Please contact your pharmacy for coverage information.   Advanced directives: Please bring a copy of your health care power of attorney and living will to the office at your convenience.  Conditions/risks identified: recommend drinking 6-8 glasses of water per day  Next appointment: Please follow up in one year for your Medicare Annual Wellness visit.     Preventive Care 38 Years and Older, Female Preventive care refers to lifestyle choices and visits with your health care provider that can promote health and wellness. What does preventive care include?  A yearly physical exam. This is also called an annual well check.  Dental exams once or twice a year.  Routine eye exams. Ask your health care provider how often you should have your eyes checked.  Personal lifestyle choices, including:  Daily care of your teeth and gums.  Regular physical activity.  Eating a healthy diet.  Avoiding tobacco and drug use.  Limiting alcohol use.  Practicing safe sex.  Taking low-dose aspirin every day.  Taking vitamin and mineral supplements as recommended by your health care provider. What happens during an annual well check? The services and screenings done by your health care provider during your annual well check will depend on  your age, overall health, lifestyle risk factors, and family history of disease. Counseling  Your health care provider may ask you questions about your:  Alcohol use.  Tobacco use.  Drug use.  Emotional well-being.  Home and relationship well-being.  Sexual activity.  Eating habits.  History of falls.  Memory and ability to understand (cognition).  Work and work Statistician.  Reproductive health. Screening  You may have the following tests or measurements:  Height, weight, and BMI.  Blood pressure.  Lipid and cholesterol levels. These may be checked every 5 years, or more frequently if you are over 31 years old.  Skin check.  Lung cancer screening. You may have this screening every year starting at age 50 if you have a 30-pack-year history of smoking and currently smoke or have quit within the past 15 years.  Fecal occult blood test (FOBT) of the stool. You may have this test every year starting at age 64.  Flexible sigmoidoscopy or colonoscopy. You may have a sigmoidoscopy every 5 years or a colonoscopy every 10 years starting at age 97.  Hepatitis C blood test.  Hepatitis B blood test.  Sexually transmitted disease (STD) testing.  Diabetes screening. This is done by checking your blood sugar (glucose) after you have not eaten for a while (fasting). You may have this done every 1-3 years.  Bone density scan. This is done to screen for osteoporosis. You may have this done starting at age 48.  Mammogram. This may be done every 1-2 years. Talk to your health care provider about how often you should have regular mammograms. Talk with your health care provider about your test results, treatment options, and if necessary, the need for more tests.  Vaccines  Your health care provider may recommend certain vaccines, such as:  Influenza vaccine. This is recommended every year.  Tetanus, diphtheria, and acellular pertussis (Tdap, Td) vaccine. You may need a Td booster  every 10 years.  Zoster vaccine. You may need this after age 45.  Pneumococcal 13-valent conjugate (PCV13) vaccine. One dose is recommended after age 84.  Pneumococcal polysaccharide (PPSV23) vaccine. One dose is recommended after age 36. Talk to your health care provider about which screenings and vaccines you need and how often you need them. This information is not intended to replace advice given to you by your health care provider. Make sure you discuss any questions you have with your health care provider. Document Released: 12/12/2015 Document Revised: 08/04/2016 Document Reviewed: 09/16/2015 Elsevier Interactive Patient Education  2017 [employer] Prevention in the Home Falls can cause injuries. They can happen to people of all ages. There are many things you can do to make your home safe and to help prevent falls. What can I do on the outside of my home?  Regularly fix the edges of walkways and driveways and fix any cracks.  Remove anything that might make you trip as you walk through a door, such as a raised step or threshold.  Trim any bushes or trees on the path to your home.  Use bright outdoor lighting.  Clear any walking paths of anything that might make someone trip, such as rocks or tools.  Regularly check to see if handrails are loose or broken. Make sure that both sides of any steps have handrails.  Any raised decks and porches should have guardrails on the edges.  Have any leaves, snow, or ice cleared regularly.  Use sand or salt on walking paths during winter.  Clean up any spills in your garage right away. This includes oil or grease spills. What can I do in the bathroom?  Use night lights.  Install grab bars by the toilet and in the tub and shower. Do not use towel bars as grab bars.  Use non-skid mats or decals in the tub or shower.  If you need to sit down in the shower, use a plastic, non-slip stool.  Keep the floor dry. Clean up any  water that spills on the floor as soon as it happens.  Remove soap buildup in the tub or shower regularly.  Attach bath mats securely with double-sided non-slip rug tape.  Do not have throw rugs and other things on the floor that can make you trip. What can I do in the bedroom?  Use night lights.  Make sure that you have a light by your bed that is easy to reach.  Do not use any sheets or blankets that are too big for your bed. They should not hang down onto the floor.  Have a firm chair that has side arms. You can use this for support while you get dressed.  Do not have throw rugs and other things on the floor that can make you trip. What can I do in the kitchen?  Clean up any spills right away.  Avoid walking on wet floors.  Keep items that you use a lot in easy-to-reach places.  If you need to reach something above you, use a strong step stool that has a grab bar.  Keep electrical cords out of the way.  Do not use floor polish or wax that makes floors slippery. If you must use wax, use non-skid floor wax.  Do not have throw rugs and other things on the floor that can make you trip. What can I do with my stairs?  Do not leave any items on the stairs.  Make sure that there are handrails on both sides of the stairs and use them. Fix handrails that are broken or loose. Make sure that handrails are as long as the stairways.  Check any carpeting to make sure that it is firmly attached to the stairs. Fix any carpet that is loose or worn.  Avoid having throw rugs at the top or bottom of the stairs. If you do have throw rugs, attach them to the floor with carpet tape.  Make sure that you have a light switch at the top of the stairs and the bottom of the stairs. If you do not have them, ask someone to add them for you. What else can I do to help prevent falls?  Wear shoes that:  Do not have high heels.  Have rubber bottoms.  Are comfortable and fit you well.  Are closed  at the toe. Do not wear sandals.  If you use a stepladder:  Make sure that it is fully opened. Do not climb a closed stepladder.  Make sure that both sides of the stepladder are locked into place.  Ask someone to hold it for you, if possible.  Clearly mark and make sure that you can see:  Any grab bars or handrails.  First and last steps.  Where the edge of each step is.  Use tools that help you move around (mobility aids) if they are needed. These include:  Canes.  Walkers.  Scooters.  Crutches.  Turn on the lights when you go into a dark area. Replace any light bulbs as soon as they burn out.  Set up your furniture so you have a clear path. Avoid moving your furniture around.  If any of your floors are uneven, fix them.  If there are any pets around you, be aware of where they are.  Review your medicines with your doctor. Some medicines can make you feel dizzy. This can increase your chance of falling. Ask your doctor what other things that you can do to help prevent falls. This information is not intended to replace advice given to you by your health care provider. Make sure you discuss any questions you have with your health care provider. Document Released: 09/11/2009 Document Revised: 04/22/2016 Document Reviewed: 12/20/2014 Elsevier Interactive Patient Education  2017 [employer].

## 2019-06-04 ENCOUNTER — Ambulatory Visit (INDEPENDENT_AMBULATORY_CARE_PROVIDER_SITE_OTHER): Payer: Medicare Other | Admitting: Internal Medicine

## 2019-06-04 ENCOUNTER — Other Ambulatory Visit: Payer: Self-pay

## 2019-06-04 ENCOUNTER — Ambulatory Visit
Admission: RE | Admit: 2019-06-04 | Discharge: 2019-06-04 | Disposition: A | Discharge status: Home / Self Care | Payer: Medicare Other | Attending: Internal Medicine | Admitting: Internal Medicine

## 2019-06-04 ENCOUNTER — Encounter: Payer: Self-pay | Admitting: Internal Medicine

## 2019-06-04 ENCOUNTER — Ambulatory Visit
Admission: RE | Admit: 2019-06-04 | Discharge: 2019-06-04 | Disposition: A | Discharge status: Home / Self Care | Payer: Medicare Other | Source: Ambulatory Visit | Attending: Internal Medicine | Admitting: Internal Medicine

## 2019-06-04 VITALS — BP 128/68 | HR 88 | Temp 97.9°F | Ht 60.0 in | Wt 142.2 lb

## 2019-06-04 DIAGNOSIS — M25561 Pain in right knee: Secondary | ICD-10-CM | POA: Diagnosis not present

## 2019-06-04 MED ORDER — PREDNISONE 10 MG PO TABS: 10.0000 mg | ORAL_TABLET | ORAL | 0 refills | Status: AC

## 2019-06-04 NOTE — Progress Notes (Signed)
Date:  06/04/2019   Name:  Donna Santos   DOB:  02-Nov-1925   MRN:  [id number]   Chief Complaint: Knee Pain (Rt knee swelling. X 2 days ago. Painful to walk due to swelling. This weekend was tough due to it.)  Knee Pain  The incident occurred 2 days ago. The incident occurred at home. There was no injury mechanism. The pain is present in the right knee. The quality of the pain is described as burning. The pain is moderate. The pain has been improving since onset. Associated symptoms include an inability to bear weight. Pertinent negatives include no numbness or tingling. She reports no foreign bodies present. The symptoms are aggravated by weight bearing and palpation. She has tried immobilization for the symptoms. The treatment provided mild relief.    Review of Systems  Constitutional: Positive for fever. Negative for chills and fatigue.  Respiratory: Negative for cough, chest tightness, shortness of breath and wheezing.   Cardiovascular: Negative for chest pain and palpitations.  Musculoskeletal: Positive for arthralgias, gait problem and joint swelling.  Skin: Negative for color change and rash.  Neurological: Negative for tingling and numbness.    Patient Active Problem List   Diagnosis Date Noted  . Primary open angle glaucoma (POAG) of left eye, moderate stage 04/25/2018  . Persistent atrial fibrillation 12/28/2016  . Carcinoma of overlapping sites of right breast in female, estrogen receptor positive ([place]) 12/21/2016  . Arthritis of right knee 08/03/2016  . Senile dementia, uncomplicated ([place]) 76/54/6503  . Low back pain 04/12/2016  . Impaired renal function 05/09/2015  . Arteriosclerosis of coronary artery 05/09/2015  . Essential (primary) hypertension 05/09/2015  . Personal history of malignant neoplasm of breast 05/09/2015  . Muscle spasms of neck 05/09/2015  . Arthritis of shoulder region, degenerative 05/09/2015  . Hypercholesteremia 04/20/2012  . Carotid artery  disease ([place]) 05/28/2005    Allergies  Allergen Reactions  . Ace Inhibitors     Other reaction(s): Angioedema  . Brinzolamide-Brimonidine     Past Surgical History:  Procedure Laterality Date  . CAROTID ENDARTERECTOMY    . CORONARY ARTERY BYPASS GRAFT  2006  . MASS EXCISION Right 08/28/2015   Procedure: EXCISION MASS; remove massive right chest wall;  Surgeon: Florene Glen, MD;  Location: [facility] ORS;  Service: General;  Laterality: Right;  . MASTECTOMY, RADICAL Right 2002   BREAST CA  . TOTAL ABDOMINAL HYSTERECTOMY      Social History   Tobacco Use  . Smoking status: Former Smoker    Packs/day: 1.50    Years: 1.50    Pack years: 2.25    Types: Cigarettes    Quit date: 10/04/1952    Years since quitting: 66.7  . Smokeless tobacco: Never Used  Substance Use Topics  . Alcohol use: No    Alcohol/week: 0.0 standard drinks  . Drug use: No     Medication list has been reviewed and updated.  Current Meds  Medication Sig  . amLODipine (NORVASC) 5 MG tablet TAKE 1 TABLET BY MOUTH TWICE DAILY  . anastrozole (ARIMIDEX) 1 MG tablet TAKE 1 TABLET(1 MG) BY MOUTH DAILY  . aspirin EC 81 MG tablet Take 81 mg by mouth daily.  . Calcium Carb-Cholecalciferol (CALCIUM 600+D) 600-800 MG-UNIT TABS Take 1 tablet by mouth daily. Reported on 12/25/2015  . chlorthalidone (HYGROTON) 25 MG tablet Take 1 tablet (25 mg total) by mouth daily.  Marland Kitchen ELIQUIS 5 MG TABS tablet Take 5 mg by mouth 2 (two)  times daily.   Marland Kitchen ibuprofen (ADVIL,MOTRIN) 200 MG tablet Take 200 mg by mouth every 6 (six) hours as needed.  . isosorbide mononitrate (IMDUR) 30 MG 24 hr tablet TAKE 1 TABLET(30 MG) BY MOUTH DAILY  . latanoprost (XALATAN) 0.005 % ophthalmic solution INT 1 GTT INTO EACH EYE QHS.  Marland Kitchen losartan (COZAAR) 25 MG tablet TAKE 1 TABLET(25 MG) BY MOUTH DAILY  . metoprolol succinate (TOPROL-XL) 50 MG 24 hr tablet TAKE 1 TABLET(50 MG) BY MOUTH EVERY NIGHT  . nitroGLYCERIN (NITROSTAT) 0.4 MG SL tablet Place 0.4 mg  under the tongue every 5 (five) minutes as needed for chest pain.  Marland Kitchen timolol (TIMOPTIC) 0.25 % ophthalmic solution INSTILL ONE DROP INTO BOTH EYES EVERY MORNING.    PHQ 2/9 Scores 06/04/2019 05/28/2019 05/02/2019 03/27/2018  PHQ - 2 Score 0 0 0 0  Exception Documentation - - - -    BP Readings from Last 3 Encounters:  06/04/19 128/68  05/28/19 136/81  05/02/19 132/80    Physical Exam Vitals signs and nursing note reviewed.  Constitutional:      General: She is not in acute distress.    Appearance: She is well-developed.  HENT:     Head: Normocephalic and atraumatic.  Cardiovascular:     Rate and Rhythm: Normal rate. Rhythm irregular.     Pulses: Normal pulses.  Pulmonary:     Effort: Pulmonary effort is normal. No respiratory distress.     Breath sounds: No wheezing or rhonchi.  Musculoskeletal:     Right knee: She exhibits decreased range of motion, swelling and effusion. Tenderness found.  Skin:    General: Skin is warm and dry.     Findings: No rash.  Neurological:     Mental Status: She is alert and oriented to person, place, and time.  Psychiatric:        Behavior: Behavior normal.        Thought Content: Thought content normal.     Wt Readings from Last 3 Encounters:  06/04/19 142 lb 3.2 oz (64.5 kg)  05/28/19 141 lb (64 kg)  05/02/19 143 lb 3.2 oz (65 kg)    BP 128/68   Pulse 88   Temp 97.9 F (36.6 C) (Oral)   Ht 5' (1.524 m)   Wt 142 lb 3.2 oz (64.5 kg)   SpO2 95%   BMI 27.77 kg/m   Assessment and Plan: 1. Acute pain of right knee Begin treatment with prednisone Check labs and Xray If recurrent, may need Ortho evaluation - predniSONE (DELTASONE) 10 MG tablet; Take 1 tablet (10 mg total) by mouth as directed for 6 days. Take 6,5,4,3,2,1 then stop  Dispense: 21 tablet; Refill: 0 - DG Knee Complete 4 Views Right; Future - Uric acid - CBC with Differential/Platelet   Partially dictated using Editor, commissioning. Any errors are unintentional.  Halina Maidens, MD [facility]  06/04/2019

## 2019-06-05 LAB — CBC WITH DIFFERENTIAL/PLATELET
Basophils Absolute: 0.1 10*3/uL (ref 0.0–0.2)
Basos: 1 %
EOS (ABSOLUTE): 0.1 10*3/uL (ref 0.0–0.4)
Eos: 1 %
Hematocrit: 39.8 % (ref 34.0–46.6)
Hemoglobin: 13.3 g/dL (ref 11.1–15.9)
Immature Grans (Abs): 0.1 10*3/uL (ref 0.0–0.1)
Immature Granulocytes: 1 %
Lymphocytes Absolute: 0.8 10*3/uL (ref 0.7–3.1)
Lymphs: 9 %
MCH: 30.4 pg (ref 26.6–33.0)
MCHC: 33.4 g/dL (ref 31.5–35.7)
MCV: 91 fL (ref 79–97)
Monocytes Absolute: 1.2 10*3/uL — ABNORMAL HIGH (ref 0.1–0.9)
Monocytes: 13 %
Neutrophils Absolute: 7.4 10*3/uL — ABNORMAL HIGH (ref 1.4–7.0)
Neutrophils: 75 %
Platelets: 361 10*3/uL (ref 150–450)
RBC: 4.38 x10E6/uL (ref 3.77–5.28)
RDW: 13.1 % (ref 11.7–15.4)
WBC: 9.7 10*3/uL (ref 3.4–10.8)

## 2019-06-05 LAB — URIC ACID: Uric Acid: 5.1 mg/dL (ref 2.5–7.1)

## 2019-06-11 ENCOUNTER — Telehealth: Payer: Self-pay

## 2019-06-11 NOTE — Telephone Encounter (Signed)
Patient daughter in law Judson Roch called stating patient finished prednisone better. Getting back to herself. Her rt knee is still "20% bigger than her left" but she woke up and happy and is walking around again on her own.  Following up after prednisone taper.

## 2019-06-11 NOTE — Telephone Encounter (Signed)
Noted  

## 2019-06-14 ENCOUNTER — Other Ambulatory Visit: Payer: Self-pay

## 2019-06-14 DIAGNOSIS — C50811 Malignant neoplasm of overlapping sites of right female breast: Secondary | ICD-10-CM

## 2019-06-16 NOTE — Progress Notes (Signed)
[facility]  [address], [address] [place], [place] [address] Phone: [phone]  Fax: [phone]   Clinic Day:  06/18/2019  Referring physician: Glean Hess, MD  Chief Complaint: Donna Santos is a 85 y.o. female with stage IV breast cancer who is seen for new patient assessment  HPI: The patient was diagnosed with low grade DCIS of the right breast in 2002.  Pathology revealed a 1.5 cm area of DCIS with 5% early invasion.  Tumor was ER+ 74%, PR -, and HER-2/neu -.  She underwent a right mastectomy and axillary lymph node dissection by Dr. Burt Knack followed by reconstruction. Pathology showed no residual carcinoma. She did not receive tamoxifen.   She developed a recurrence in the right chest wall in 2016. She underwent excision by Dr Burt Knack.  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  Carcinoma extended to the deep margin.  Tumor was ER+ > 90%, PR + >90% and HER-2/neu -. She underwent radiation with 5000 cGy with a boost of 2000 cGy. She is currently on endocrine therapy with Arimidex.   Bone density on 09/11/2015 showed osteoporosis with a T-score of -2.5 in the right femur neck.  Bone density on 12/06/2016 showed osteoporosis with a T-score of -2.7 in the right femur neck.  She is currently on calcium and Vitamin D.  She declined bisphosphonates.   The patient was last seen in the medical oncology clinic on 12/19/2018 by Dr. Rogue Bussing. At that time, she was doing well. She denied any falls. Her family reported short term memory loss. She continued on Arimidex.   During the interim, she is doing "fine." The patient is a poor historian. She denies any fevers, sweats, or weight loss. She denies any headaches or visual changes, cough, shortness of breath, or chest pain. She denies any nausea, vomiting, diarrhea, or blood in her stools. She denies any urinary symptoms. She denies any  bone or joint issues.   She has had cataract surgery. She had a total hysterectomy. She underwent a CABG in 2006. She has a family history of breast cancer in her mother.   She is tolerating Arimidex well. She denies any hot flashes or joint aches.    Past Medical History:  Diagnosis Date   Arteriosclerosis of coronary artery    Arthritis of shoulder region    Breast cancer ([place]) 2002   RT MASTECTOMY   Cataract    Essential hypertension    Glaucoma    Hypercholesteremia    Impaired renal function    Muscle spasms of neck    Tobacco abuse     Past Surgical History:  Procedure Laterality Date   CAROTID ENDARTERECTOMY     CORONARY ARTERY BYPASS GRAFT  2006   MASS EXCISION Right 08/28/2015   Procedure: EXCISION MASS; remove massive right chest wall;  Surgeon: Florene Glen, MD;  Location: [facility] ORS;  Service: General;  Laterality: Right;   MASTECTOMY, RADICAL Right 2002   BREAST CA   TOTAL ABDOMINAL HYSTERECTOMY      Family History  Problem Relation Age of Onset   Hypertension Father    Cancer Father    Breast cancer Mother     Social History:  reports that she quit smoking about 66 years ago. Her smoking use included cigarettes. She has a 2.25 pack-year smoking history. She has never used smokeless tobacco. She reports that she does not drink alcohol or use  drugs. She does not drink. She denies any known exposure to radiation or toxins. She is retired by worked at [employer] until her children were born. When they were grown, she worked in [place] with her husband. She lives in [place]. Her son's name is Donna Santos and daughter-in-law, Judson Roch.  The patient is alone today; her daughter, Donna Santos, is on phone [phone]).   Allergies:  Allergies  Allergen Reactions   Ace Inhibitors     Other reaction(s): Angioedema   Brinzolamide-Brimonidine     Current Medications: Current Outpatient Medications  Medication Sig Dispense Refill   amLODipine  (NORVASC) 5 MG tablet TAKE 1 TABLET BY MOUTH TWICE DAILY 60 tablet 12   anastrozole (ARIMIDEX) 1 MG tablet TAKE 1 TABLET(1 MG) BY MOUTH DAILY 90 tablet 4   aspirin EC 81 MG tablet Take 81 mg by mouth daily.     Calcium Carb-Cholecalciferol (CALCIUM 600+D) 600-800 MG-UNIT TABS Take 1 tablet by mouth daily. Reported on 12/25/2015     chlorthalidone (HYGROTON) 25 MG tablet Take 1 tablet (25 mg total) by mouth daily. 30 tablet 2   ELIQUIS 5 MG TABS tablet Take 5 mg by mouth 2 (two) times daily.      ibuprofen (ADVIL,MOTRIN) 200 MG tablet Take 200 mg by mouth every 6 (six) hours as needed.     latanoprost (XALATAN) 0.005 % ophthalmic solution INT 1 GTT INTO EACH EYE QHS.  6   losartan (COZAAR) 25 MG tablet TAKE 1 TABLET(25 MG) BY MOUTH DAILY 30 tablet 12   metoprolol succinate (TOPROL-XL) 50 MG 24 hr tablet TAKE 1 TABLET(50 MG) BY MOUTH EVERY NIGHT 90 tablet 3   timolol (TIMOPTIC) 0.25 % ophthalmic solution INSTILL ONE DROP INTO BOTH EYES EVERY MORNING.  6   isosorbide mononitrate (IMDUR) 30 MG 24 hr tablet TAKE 1 TABLET(30 MG) BY MOUTH DAILY 30 tablet 5   nitroGLYCERIN (NITROSTAT) 0.4 MG SL tablet Place 0.4 mg under the tongue every 5 (five) minutes as needed for chest pain.     No current facility-administered medications for this visit.     Review of Systems  Constitutional: Negative.  Negative for chills, diaphoresis, fever, malaise/fatigue and weight loss.       Doing "fine."  HENT: Negative.  Negative for congestion, hearing loss, sinus pain and sore throat.   Eyes: Negative.  Negative for blurred vision.  Respiratory: Negative.  Negative for cough, shortness of breath and wheezing.   Cardiovascular: Positive for leg swelling (L>R). Negative for chest pain, palpitations, orthopnea and PND.  Gastrointestinal: Negative.  Negative for abdominal pain, blood in stool, constipation, diarrhea, melena, nausea and vomiting.  Genitourinary: Negative.  Negative for dysuria, frequency,  hematuria and urgency.  Musculoskeletal: Negative for back pain, joint pain and myalgias.  Skin: Negative.  Negative for rash.  Neurological: Negative.  Negative for dizziness, tingling, sensory change, weakness and headaches.  Endo/Heme/Allergies: Negative.  Does not bruise/bleed easily.  Psychiatric/Behavioral: Positive for memory loss. Negative for depression and substance abuse. The patient is not nervous/anxious and does not have insomnia.   All other systems reviewed and are negative.  Performance status (ECOG): 1  Vitals Blood pressure (!) 147/83, pulse 88, temperature (!) 97.4 F (36.3 C), temperature source Tympanic, resp. rate 16, weight 138 lb 9 oz (62.9 kg), SpO2 99 %.   Physical Exam  Constitutional: She is oriented to person, place, and time. She appears well-developed and well-nourished. No distress.  HENT:  Head: Normocephalic and atraumatic.  Mouth/Throat: Oropharynx is clear  and moist. No oropharyngeal exudate.  White hair. Mask.   Eyes: Pupils are equal, round, and reactive to light. Conjunctivae and EOM are normal. No scleral icterus.  Glasses. s/p cataract surgery.  Neck: Normal range of motion. Neck supple.  Cardiovascular: Normal rate, regular rhythm and normal heart sounds.  No murmur heard. Well healed median sternotomy scar.  Pulmonary/Chest: Effort normal and breath sounds normal. No respiratory distress. She has no wheezes. Right breast exhibits skin change (vascular web). Right breast exhibits no mass and no tenderness. Left breast exhibits skin change (fibrocystic changes inferiorly, 1:00). Left breast exhibits no inverted nipple, no mass, no nipple discharge and no tenderness.  Abdominal: Soft. Bowel sounds are normal. She exhibits no distension. There is no abdominal tenderness.  Musculoskeletal: Normal range of motion.        General: Edema (chronic, L>R) present.  Lymphadenopathy:    She has no cervical adenopathy.    She has no axillary adenopathy.         Right: No supraclavicular adenopathy present.       Left: No supraclavicular adenopathy present.  Neurological: She is alert and oriented to person, place, and time.  Skin: Skin is warm and dry. She is not diaphoretic.  Psychiatric: She has a normal mood and affect. Her behavior is normal. Judgment and thought content normal.  Nursing note and vitals reviewed.   Appointment on 06/18/2019  Component Date Value Ref Range Status   Sodium 06/18/2019 132* 135 - 145 mmol/L Final   Potassium 06/18/2019 3.4* 3.5 - 5.1 mmol/L Final   Chloride 06/18/2019 98  98 - 111 mmol/L Final   CO2 06/18/2019 23  22 - 32 mmol/L Final   Glucose, Bld 06/18/2019 110* 70 - 99 mg/dL Final   BUN 06/18/2019 21  8 - 23 mg/dL Final   Creatinine, Ser 06/18/2019 1.04* 0.44 - 1.00 mg/dL Final   Calcium 06/18/2019 9.2  8.9 - 10.3 mg/dL Final   Total Protein 06/18/2019 7.1  6.5 - 8.1 g/dL Final   Albumin 06/18/2019 3.7  3.5 - 5.0 g/dL Final   AST 06/18/2019 13* 15 - 41 U/L Final   ALT 06/18/2019 10  0 - 44 U/L Final   Alkaline Phosphatase 06/18/2019 35* 38 - 126 U/L Final   Total Bilirubin 06/18/2019 0.7  0.3 - 1.2 mg/dL Final   GFR calc non Af Amer 06/18/2019 47* >60 mL/min Final   GFR calc Af Amer 06/18/2019 54* >60 mL/min Final   Anion gap 06/18/2019 11  5 - 15 Final   Performed at [facility], [address] [place], [place] [address]   WBC 06/18/2019 8.0  4.0 - 10.5 K/uL Final   RBC 06/18/2019 4.19  3.87 - 5.11 MIL/uL Final   Hemoglobin 06/18/2019 12.7  12.0 - 15.0 g/dL Final   HCT 06/18/2019 38.0  36.0 - 46.0 % Final   MCV 06/18/2019 90.7  80.0 - 100.0 fL Final   MCH 06/18/2019 30.3  26.0 - 34.0 pg Final   MCHC 06/18/2019 33.4  30.0 - 36.0 g/dL Final   RDW 06/18/2019 13.2  11.5 - 15.5 % Final   Platelets 06/18/2019 375  150 - 400 K/uL Final   nRBC 06/18/2019 0.0  0.0 - 0.2 % Final   Neutrophils Relative % 06/18/2019 75  % Final   Neutro Abs 06/18/2019 6.0   1.7 - 7.7 K/uL Final   Lymphocytes Relative 06/18/2019 11  % Final   Lymphs Abs 06/18/2019 0.9  0.7 -  4.0 K/uL Final   Monocytes Relative 06/18/2019 10  % Final   Monocytes Absolute 06/18/2019 0.8  0.1 - 1.0 K/uL Final   Eosinophils Relative 06/18/2019 2  % Final   Eosinophils Absolute 06/18/2019 0.1  0.0 - 0.5 K/uL Final   Basophils Relative 06/18/2019 1  % Final   Basophils Absolute 06/18/2019 0.1  0.0 - 0.1 K/uL Final   Immature Granulocytes 06/18/2019 1  % Final   Abs Immature Granulocytes 06/18/2019 0.07  0.00 - 0.07 K/uL Final   Performed at [facility], [address] [place], [place] [address]    Assessment:  Donna Santos is a 85 y.o. female with recurrent right breast cancer.  She presented with low grade DCIS of the right breast in 2002.  Pathology revealed a 1.5 cm area of DCIS with 5% early invasion.  Tumor was ER+ 74%, PR -, and HER-2/neu -.   She underwent a right mastectomy and axillary lymph node dissection followed by reconstruction.  Pathology showed no residual carcinoma.  She did not receive tamoxifen.   She developed a right chest wall recurrence in 2016.    She underwent excision.  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  Carcinoma extended to the deep margin.  Tumor was ER+ > 90%, PR + >90% and HER-2/neu -.    She received radiation (5000 cGy with a boost of 2000 cGy). She is currently on Arimidex.   CA 27.29 was 14 on 06/18/2019.  Bone density on 09/11/2015 showed osteoporosis with a T-score of -2.5 in the right femur neck.  Bone density on 12/06/2014 showed osteoporosis with a T-score of -2.7 in the right femur neck.  She is currently on calcium and Vitamin D.  She declined bisphosphonates.   Symptomatically, she is doing well.  Exam reveals post-operative and post-radiation changes.  Plan: 1.   Labs today:  CBC with diff, CMP, CA27.29. 2.   Recurrent right  breast cancer  Review entire medical history, diagnosis and management of breast cancer.    Clinically, she is doing well.  Left breast mammogram on 06/29/2019.  Continue Arimidex. 3.   Osteoporosis   Bone density overdue  Continue calcium and vitamin D  Patient declined bisphosphonates. 4.   Schedule bone density. 5.   RTC in 6 months for MD assessment and labs (CBC with diff, CMP, CA27.29).  I discussed the assessment and treatment plan with the patient.  The patient was provided an opportunity to ask questions and all were answered.  The patient agreed with the plan and demonstrated an understanding of the instructions.  The patient was advised to call back if the symptoms worsen or if the condition fails to improve as anticipated.  I provided 30 minutes of face-to-face time during this this encounter and > 50% was spent counseling as documented under my assessment and plan.    Lequita Asal, MD, PhD    06/18/2019, 1:36 PM  I, Molly Dorshimer, am acting as Education administrator for [employer]. Mike Gip, MD, PhD.  I, Renise Gillies C. Mike Gip, MD, have reviewed the above documentation for accuracy and completeness, and I agree with the above.

## 2019-06-17 ENCOUNTER — Other Ambulatory Visit: Payer: Self-pay | Admitting: Hematology and Oncology

## 2019-06-17 DIAGNOSIS — Z853 Personal history of malignant neoplasm of breast: Secondary | ICD-10-CM

## 2019-06-17 DIAGNOSIS — M810 Age-related osteoporosis without current pathological fracture: Secondary | ICD-10-CM | POA: Insufficient documentation

## 2019-06-18 ENCOUNTER — Inpatient Hospital Stay: Payer: Medicare Other | Attending: Hematology and Oncology | Admitting: Hematology and Oncology

## 2019-06-18 ENCOUNTER — Encounter: Payer: Self-pay | Admitting: Hematology and Oncology

## 2019-06-18 ENCOUNTER — Inpatient Hospital Stay: Payer: Medicare Other

## 2019-06-18 ENCOUNTER — Other Ambulatory Visit: Payer: Self-pay | Admitting: Internal Medicine

## 2019-06-18 ENCOUNTER — Other Ambulatory Visit: Payer: Self-pay

## 2019-06-18 VITALS — BP 147/83 | HR 88 | Temp 97.4°F | Resp 16 | Wt 138.6 lb

## 2019-06-18 DIAGNOSIS — M810 Age-related osteoporosis without current pathological fracture: Secondary | ICD-10-CM | POA: Diagnosis not present

## 2019-06-18 DIAGNOSIS — C50811 Malignant neoplasm of overlapping sites of right female breast: Secondary | ICD-10-CM | POA: Insufficient documentation

## 2019-06-18 DIAGNOSIS — Z170 Estrogen receptor positive status [ER+]: Secondary | ICD-10-CM | POA: Insufficient documentation

## 2019-06-18 DIAGNOSIS — Z87891 Personal history of nicotine dependence: Secondary | ICD-10-CM | POA: Diagnosis not present

## 2019-06-18 DIAGNOSIS — Z853 Personal history of malignant neoplasm of breast: Secondary | ICD-10-CM

## 2019-06-18 LAB — CBC WITH DIFFERENTIAL/PLATELET
Abs Immature Granulocytes: 0.07 10*3/uL (ref 0.00–0.07)
Basophils Absolute: 0.1 10*3/uL (ref 0.0–0.1)
Basophils Relative: 1 %
Eosinophils Absolute: 0.1 10*3/uL (ref 0.0–0.5)
Eosinophils Relative: 2 %
HCT: 38 % (ref 36.0–46.0)
Hemoglobin: 12.7 g/dL (ref 12.0–15.0)
Immature Granulocytes: 1 %
Lymphocytes Relative: 11 %
Lymphs Abs: 0.9 10*3/uL (ref 0.7–4.0)
MCH: 30.3 pg (ref 26.0–34.0)
MCHC: 33.4 g/dL (ref 30.0–36.0)
MCV: 90.7 fL (ref 80.0–100.0)
Monocytes Absolute: 0.8 10*3/uL (ref 0.1–1.0)
Monocytes Relative: 10 %
Neutro Abs: 6 10*3/uL (ref 1.7–7.7)
Neutrophils Relative %: 75 %
Platelets: 375 10*3/uL (ref 150–400)
RBC: 4.19 MIL/uL (ref 3.87–5.11)
RDW: 13.2 % (ref 11.5–15.5)
WBC: 8 10*3/uL (ref 4.0–10.5)
nRBC: 0 % (ref 0.0–0.2)

## 2019-06-18 LAB — COMPREHENSIVE METABOLIC PANEL
ALT: 10 U/L (ref 0–44)
AST: 13 U/L — ABNORMAL LOW (ref 15–41)
Albumin: 3.7 g/dL (ref 3.5–5.0)
Alkaline Phosphatase: 35 U/L — ABNORMAL LOW (ref 38–126)
Anion gap: 11 (ref 5–15)
BUN: 21 mg/dL (ref 8–23)
CO2: 23 mmol/L (ref 22–32)
Calcium: 9.2 mg/dL (ref 8.9–10.3)
Chloride: 98 mmol/L (ref 98–111)
Creatinine, Ser: 1.04 mg/dL — ABNORMAL HIGH (ref 0.44–1.00)
GFR calc Af Amer: 54 mL/min — ABNORMAL LOW (ref >60)
GFR calc non Af Amer: 47 mL/min — ABNORMAL LOW (ref >60)
Glucose, Bld: 110 mg/dL — ABNORMAL HIGH (ref 70–99)
Potassium: 3.4 mmol/L — ABNORMAL LOW (ref 3.5–5.1)
Sodium: 132 mmol/L — ABNORMAL LOW (ref 135–145)
Total Bilirubin: 0.7 mg/dL (ref 0.3–1.2)
Total Protein: 7.1 g/dL (ref 6.5–8.1)

## 2019-06-18 NOTE — Progress Notes (Signed)
Pt here for follow up. Previous Dr. Brahmanday patient. Denies any concerns.  

## 2019-06-19 LAB — CANCER ANTIGEN 27.29: CA 27.29: 14 U/mL (ref 0.0–38.6)

## 2019-06-28 ENCOUNTER — Other Ambulatory Visit: Payer: Self-pay | Admitting: Internal Medicine

## 2019-07-02 ENCOUNTER — Ambulatory Visit: Payer: Medicare Other

## 2019-07-13 ENCOUNTER — Other Ambulatory Visit: Payer: Self-pay | Admitting: Internal Medicine

## 2019-07-13 DIAGNOSIS — I2510 Atherosclerotic heart disease of native coronary artery without angina pectoris: Secondary | ICD-10-CM

## 2019-07-16 DIAGNOSIS — I25118 Atherosclerotic heart disease of native coronary artery with other forms of angina pectoris: Secondary | ICD-10-CM | POA: Diagnosis not present

## 2019-07-16 DIAGNOSIS — I4819 Other persistent atrial fibrillation: Secondary | ICD-10-CM | POA: Diagnosis not present

## 2019-07-16 DIAGNOSIS — I10 Essential (primary) hypertension: Secondary | ICD-10-CM | POA: Diagnosis not present

## 2019-07-16 DIAGNOSIS — I739 Peripheral vascular disease, unspecified: Secondary | ICD-10-CM | POA: Diagnosis not present

## 2019-07-16 DIAGNOSIS — E876 Hypokalemia: Secondary | ICD-10-CM | POA: Diagnosis not present

## 2019-07-19 DIAGNOSIS — I10 Essential (primary) hypertension: Secondary | ICD-10-CM | POA: Diagnosis not present

## 2019-07-25 ENCOUNTER — Other Ambulatory Visit: Payer: Self-pay | Admitting: Hematology and Oncology

## 2019-07-25 ENCOUNTER — Ambulatory Visit
Admission: RE | Admit: 2019-07-25 | Discharge: 2019-07-25 | Disposition: A | Discharge status: Home / Self Care | Payer: Medicare Other | Source: Ambulatory Visit | Attending: Hematology and Oncology | Admitting: Hematology and Oncology

## 2019-07-25 DIAGNOSIS — Z1231 Encounter for screening mammogram for malignant neoplasm of breast: Secondary | ICD-10-CM | POA: Insufficient documentation

## 2019-07-25 DIAGNOSIS — Z170 Estrogen receptor positive status [ER+]: Secondary | ICD-10-CM | POA: Insufficient documentation

## 2019-07-25 DIAGNOSIS — M810 Age-related osteoporosis without current pathological fracture: Secondary | ICD-10-CM | POA: Diagnosis not present

## 2019-07-25 DIAGNOSIS — C50811 Malignant neoplasm of overlapping sites of right female breast: Secondary | ICD-10-CM

## 2019-07-25 DIAGNOSIS — R928 Other abnormal and inconclusive findings on diagnostic imaging of breast: Secondary | ICD-10-CM

## 2019-07-25 DIAGNOSIS — Z780 Asymptomatic menopausal state: Secondary | ICD-10-CM | POA: Diagnosis not present

## 2019-07-26 ENCOUNTER — Telehealth: Payer: Self-pay

## 2019-07-26 NOTE — Telephone Encounter (Signed)
Left a message to inform the patient that her bone density report ( show worser ( thinning of the bone). I have also inform the patient is she has any further question please feel free to contact the office.

## 2019-07-26 NOTE — Telephone Encounter (Signed)
-----   Message from Lequita Asal, MD sent at 07/25/2019  5:27 PM EDT ----- Regarding: Please call patient  Bone density is getting worse (thinner).  M ----- Message ----- From: Interface, Rad Results In Sent: 07/25/2019  11:39 AM EDT To: Lequita Asal, MD

## 2019-08-01 DIAGNOSIS — H401132 Primary open-angle glaucoma, bilateral, moderate stage: Secondary | ICD-10-CM | POA: Diagnosis not present

## 2019-08-01 DIAGNOSIS — Z961 Presence of intraocular lens: Secondary | ICD-10-CM | POA: Diagnosis not present

## 2019-08-08 ENCOUNTER — Ambulatory Visit
Admission: RE | Admit: 2019-08-08 | Discharge: 2019-08-08 | Disposition: A | Discharge status: Home / Self Care | Payer: Medicare Other | Source: Ambulatory Visit | Attending: Hematology and Oncology | Admitting: Hematology and Oncology

## 2019-08-08 DIAGNOSIS — R928 Other abnormal and inconclusive findings on diagnostic imaging of breast: Secondary | ICD-10-CM | POA: Diagnosis not present

## 2019-08-08 DIAGNOSIS — Z170 Estrogen receptor positive status [ER+]: Secondary | ICD-10-CM | POA: Insufficient documentation

## 2019-08-08 DIAGNOSIS — C50811 Malignant neoplasm of overlapping sites of right female breast: Secondary | ICD-10-CM

## 2019-08-08 DIAGNOSIS — R922 Inconclusive mammogram: Secondary | ICD-10-CM | POA: Diagnosis not present

## 2019-08-08 DIAGNOSIS — N6489 Other specified disorders of breast: Secondary | ICD-10-CM | POA: Diagnosis not present

## 2019-08-08 DIAGNOSIS — E876 Hypokalemia: Secondary | ICD-10-CM | POA: Diagnosis not present

## 2019-10-16 ENCOUNTER — Telehealth: Payer: Self-pay

## 2019-10-16 ENCOUNTER — Other Ambulatory Visit: Payer: Self-pay

## 2019-10-16 DIAGNOSIS — M170 Bilateral primary osteoarthritis of knee: Secondary | ICD-10-CM

## 2019-10-16 DIAGNOSIS — M25561 Pain in right knee: Secondary | ICD-10-CM

## 2019-10-16 NOTE — Telephone Encounter (Signed)
Patient son Chip is calling stating patient is starting to have issues with knees again. A few days agp she was complaining of stiffness in one knee. Now she said the pain in both knees is pretty bad. Wants to know if we can prescribe something for this or next steps for patient. Patient was given prednisone in July of this year for this and it did give her a significant amount of relief.   Benedict Needy, CMA

## 2019-10-16 NOTE — Telephone Encounter (Signed)
Pt daughter in law Clarise Cruz Informed and placed referral for [facility] in [place]. She will take her to Urgent Care in [place] if pain worsens.

## 2019-10-16 NOTE — Telephone Encounter (Signed)
Her xray from July showed advanced OA of the knee.  Repeated cortisone courses are not recommended.  She should consult Orthopedics.  In the meantime, take tylenol 500 mg four times per day as needed.

## 2019-10-31 DIAGNOSIS — H401132 Primary open-angle glaucoma, bilateral, moderate stage: Secondary | ICD-10-CM | POA: Diagnosis not present

## 2019-10-31 DIAGNOSIS — Z961 Presence of intraocular lens: Secondary | ICD-10-CM | POA: Diagnosis not present

## 2019-11-16 ENCOUNTER — Ambulatory Visit (INDEPENDENT_AMBULATORY_CARE_PROVIDER_SITE_OTHER): Payer: Medicare Other | Admitting: Internal Medicine

## 2019-11-16 ENCOUNTER — Encounter: Payer: Self-pay | Admitting: Internal Medicine

## 2019-11-16 ENCOUNTER — Other Ambulatory Visit: Payer: Self-pay

## 2019-11-16 VITALS — BP 132/72 | HR 93 | Ht 60.0 in | Wt 139.0 lb

## 2019-11-16 DIAGNOSIS — I779 Disorder of arteries and arterioles, unspecified: Secondary | ICD-10-CM

## 2019-11-16 DIAGNOSIS — N289 Disorder of kidney and ureter, unspecified: Secondary | ICD-10-CM

## 2019-11-16 DIAGNOSIS — M1711 Unilateral primary osteoarthritis, right knee: Secondary | ICD-10-CM | POA: Diagnosis not present

## 2019-11-16 DIAGNOSIS — C7989 Secondary malignant neoplasm of other specified sites: Secondary | ICD-10-CM

## 2019-11-16 DIAGNOSIS — C50911 Malignant neoplasm of unspecified site of right female breast: Secondary | ICD-10-CM

## 2019-11-16 DIAGNOSIS — I4819 Other persistent atrial fibrillation: Secondary | ICD-10-CM

## 2019-11-16 DIAGNOSIS — I10 Essential (primary) hypertension: Secondary | ICD-10-CM | POA: Diagnosis not present

## 2019-11-16 NOTE — Progress Notes (Signed)
Date:  11/16/2019   Name:  Donna Santos   DOB:  1925-10-21   MRN:  [id number]   Chief Complaint: Hypertension  Hypertension This is a chronic problem. The problem is controlled. Associated symptoms include peripheral edema. Pertinent negatives include no chest pain, headaches, palpitations or shortness of breath. There are no associated agents to hypertension. Past treatments include diuretics, beta blockers, calcium channel blockers and angiotensin blockers. Hypertensive end-organ damage includes kidney disease and CAD/MI.  Knee Pain  There was no injury mechanism. The pain is present in the right knee. The quality of the pain is described as aching. The pain is mild. The pain has been fluctuating since onset. Pertinent negatives include no numbness. The symptoms are aggravated by weight bearing. She has tried acetaminophen for the symptoms. The treatment provided mild (she also uses a cane for support - she is not interested in a cortisone injection or orthopedic evaluation) relief.  Atrial fibrillation -  On chronic Eliquis and metoprolol.  She does not have any symptoms of chest pain, palpitations or tachycardia.  She sees the cardiologist regularly for CAD and Afib.  She denies bleeding issues. Recurrent right breast cancer - followed by Oncology.  She is on Arimidex and tolerating it well.  She is on calcium and vitamin D for osteoporosis. She feels well, still lives independently with good family support.  Lab Results  Component Value Date   CREATININE 1.04 (H) 06/18/2019   BUN 21 06/18/2019   NA 132 (L) 06/18/2019   K 3.4 (L) 06/18/2019   CL 98 06/18/2019   CO2 23 06/18/2019   Lab Results  Component Value Date   CHOL 261 (A) 07/24/2013   HDL 44 07/24/2013   LDLCALC 179 07/24/2013   TRIG 192 (A) 07/24/2013   Lab Results  Component Value Date   TSH 3.300 05/02/2019   No results found for: HGBA1C   Review of Systems  Constitutional: Negative for chills, fatigue,  fever and unexpected weight change.  Respiratory: Negative for chest tightness and shortness of breath.   Cardiovascular: Positive for leg swelling. Negative for chest pain and palpitations.  Gastrointestinal: Negative for abdominal pain and blood in stool.  Genitourinary: Negative for difficulty urinating and hematuria.  Musculoskeletal: Positive for arthralgias and gait problem.  Skin: Negative for rash.  Neurological: Positive for weakness (with walking at times) and light-headedness (intermitently complains to family). Negative for dizziness, tremors, numbness and headaches.  Psychiatric/Behavioral: Negative for dysphoric mood and sleep disturbance. The patient is not nervous/anxious.     Patient Active Problem List   Diagnosis Date Noted  . Osteoporosis 06/17/2019  . Primary open angle glaucoma (POAG) of left eye, moderate stage 04/25/2018  . Persistent atrial fibrillation ([place]) 12/28/2016  . Carcinoma of overlapping sites of right breast in female, estrogen receptor positive ([place]) 12/21/2016  . Arthritis of right knee 08/03/2016  . Senile dementia, uncomplicated ([place]) 0000000  . Low back pain 04/12/2016  . Impaired renal function 05/09/2015  . Arteriosclerosis of coronary artery 05/09/2015  . Essential (primary) hypertension 05/09/2015  . Personal history of malignant neoplasm of breast 05/09/2015  . Muscle spasms of neck 05/09/2015  . Arthritis of shoulder region, degenerative 05/09/2015  . Hypercholesteremia 04/20/2012  . Carotid artery disease ([place]) 05/28/2005    Allergies  Allergen Reactions  . Ace Inhibitors     Other reaction(s): Angioedema  . Brinzolamide-Brimonidine     Past Surgical History:  Procedure Laterality Date  . CAROTID ENDARTERECTOMY    .  CORONARY ARTERY BYPASS GRAFT  2006  . MASS EXCISION Right 08/28/2015   Procedure: EXCISION MASS; remove massive right chest wall;  Surgeon: Florene Glen, MD;  Location: [facility] ORS;  Service: General;   Laterality: Right;  . MASTECTOMY, RADICAL Right 2002   BREAST CA  . TOTAL ABDOMINAL HYSTERECTOMY      Social History   Tobacco Use  . Smoking status: Former Smoker    Packs/day: 1.50    Years: 1.50    Pack years: 2.25    Types: Cigarettes    Quit date: 10/04/1952    Years since quitting: 67.1  . Smokeless tobacco: Never Used  Substance Use Topics  . Alcohol use: No    Alcohol/week: 0.0 standard drinks  . Drug use: No     Medication list has been reviewed and updated.  Current Meds  Medication Sig  . amLODipine (NORVASC) 5 MG tablet TAKE 1 TABLET BY MOUTH TWICE DAILY  . anastrozole (ARIMIDEX) 1 MG tablet TAKE 1 TABLET(1 MG) BY MOUTH DAILY  . aspirin EC 81 MG tablet Take 81 mg by mouth daily.  . Calcium Carb-Cholecalciferol (CALCIUM 600+D) 600-800 MG-UNIT TABS Take 1 tablet by mouth daily. Reported on 12/25/2015  . chlorthalidone (HYGROTON) 25 MG tablet TAKE 1 TABLET BY MOUTH DAILY  . ELIQUIS 5 MG TABS tablet Take 5 mg by mouth 2 (two) times daily.   Marland Kitchen ibuprofen (ADVIL,MOTRIN) 200 MG tablet Take 200 mg by mouth every 6 (six) hours as needed.  . isosorbide mononitrate (IMDUR) 30 MG 24 hr tablet TAKE 1 TABLET(30 MG) BY MOUTH DAILY  . latanoprost (XALATAN) 0.005 % ophthalmic solution INT 1 GTT INTO EACH EYE QHS.  Marland Kitchen losartan (COZAAR) 25 MG tablet TAKE 1 TABLET(25 MG) BY MOUTH DAILY  . metoprolol succinate (TOPROL-XL) 50 MG 24 hr tablet TAKE 1 TABLET(50 MG) BY MOUTH EVERY NIGHT  . nitroGLYCERIN (NITROSTAT) 0.4 MG SL tablet Place 0.4 mg under the tongue every 5 (five) minutes as needed for chest pain.  Marland Kitchen timolol (TIMOPTIC) 0.25 % ophthalmic solution INSTILL ONE DROP INTO BOTH EYES EVERY MORNING.    PHQ 2/9 Scores 11/16/2019 06/04/2019 05/28/2019 05/02/2019  PHQ - 2 Score 0 0 0 0  PHQ- 9 Score 0 - - -  Exception Documentation - - - -    BP Readings from Last 3 Encounters:  11/16/19 132/72  06/18/19 (!) 147/83  06/04/19 128/68    Physical Exam Constitutional:      Appearance:  Normal appearance.  Cardiovascular:     Rate and Rhythm: Normal rate. Rhythm irregular.  Pulmonary:     Effort: Pulmonary effort is normal.     Breath sounds: No wheezing or rhonchi.  Musculoskeletal:     Right knee: Bony tenderness and crepitus present. Decreased range of motion.     Left knee: Crepitus present. Decreased range of motion.     Right lower leg: No edema.     Left lower leg: Edema present.  Lymphadenopathy:     Cervical: No cervical adenopathy.  Skin:    General: Skin is warm and dry.  Neurological:     Mental Status: She is alert.     Wt Readings from Last 3 Encounters:  11/16/19 139 lb (63 kg)  06/18/19 138 lb 9 oz (62.9 kg)  06/04/19 142 lb 3.2 oz (64.5 kg)    BP 132/72   Pulse 93   Ht 5' (1.524 m)   Wt 139 lb (63 kg)   SpO2 97%  BMI 27.15 kg/m   Assessment and Plan: 1. Essential (primary) hypertension Clinically stable exam with well controlled BP.   Tolerating medications, without side effects at this time. Pt to continue current regimen and low sodium diet  2. Persistent atrial fibrillation (H[facility]) Rate controlled on metoprolol Anticoagulated with Eliquis - no bleeding issues noted  3. Impaired renal function Will continue to monitor Take tylenol rather than Advil/Aleve if needed - Comprehensive metabolic panel  4. Arthritis of right knee Continue topical rubs; cane for support Tylenol as needed Pt declines Ortho referral at this time  5. Chest wall recurrence of right breast cancer ([place]) Followed by Oncology - doing well per patient and family  On Arimidex daily without side effects  6. Carotid artery disease, unspecified laterality ([place]) Currently has no chest pain or palpitations; she gets slightly short of breath with prolonged ambulation She is on Aspirin and DOAC as well as ISDN  Overall appears stable and doing well   Partially dictated using Editor, commissioning. Any errors are unintentional.  Halina Maidens, MD [facility]  11/16/2019

## 2019-11-17 LAB — COMPREHENSIVE METABOLIC PANEL
ALT: 9 IU/L (ref 0–32)
AST: 14 IU/L (ref 0–40)
Albumin/Globulin Ratio: 1.8 (ref 1.2–2.2)
Albumin: 4.6 g/dL (ref 3.5–4.6)
Alkaline Phosphatase: 42 IU/L (ref 39–117)
BUN/Creatinine Ratio: 21 (ref 12–28)
BUN: 20 mg/dL (ref 10–36)
Bilirubin Total: 0.6 mg/dL (ref 0.0–1.2)
CO2: 21 mmol/L (ref 20–29)
Calcium: 9.8 mg/dL (ref 8.7–10.3)
Chloride: 91 mmol/L — ABNORMAL LOW (ref 96–106)
Creatinine, Ser: 0.95 mg/dL (ref 0.57–1.00)
GFR calc Af Amer: 60 mL/min/{1.73_m2} (ref >59)
GFR calc non Af Amer: 52 mL/min/{1.73_m2} — ABNORMAL LOW (ref >59)
Globulin, Total: 2.5 g/dL (ref 1.5–4.5)
Glucose: 102 mg/dL — ABNORMAL HIGH (ref 65–99)
Potassium: 3.6 mmol/L (ref 3.5–5.2)
Sodium: 129 mmol/L — ABNORMAL LOW (ref 134–144)
Total Protein: 7.1 g/dL (ref 6.0–8.5)

## 2019-12-14 ENCOUNTER — Inpatient Hospital Stay: Payer: Medicare Other | Attending: Hematology and Oncology

## 2019-12-14 ENCOUNTER — Telehealth: Payer: Self-pay

## 2019-12-14 ENCOUNTER — Other Ambulatory Visit: Payer: Self-pay

## 2019-12-14 DIAGNOSIS — Z170 Estrogen receptor positive status [ER+]: Secondary | ICD-10-CM

## 2019-12-14 DIAGNOSIS — C50811 Malignant neoplasm of overlapping sites of right female breast: Secondary | ICD-10-CM | POA: Insufficient documentation

## 2019-12-14 LAB — CBC WITH DIFFERENTIAL/PLATELET
Abs Immature Granulocytes: 0.04 10*3/uL (ref 0.00–0.07)
Basophils Absolute: 0.1 10*3/uL (ref 0.0–0.1)
Basophils Relative: 1 %
Eosinophils Absolute: 0.2 10*3/uL (ref 0.0–0.5)
Eosinophils Relative: 2 %
HCT: 40.9 % (ref 36.0–46.0)
Hemoglobin: 13.9 g/dL (ref 12.0–15.0)
Immature Granulocytes: 0 %
Lymphocytes Relative: 9 %
Lymphs Abs: 0.9 10*3/uL (ref 0.7–4.0)
MCH: 31 pg (ref 26.0–34.0)
MCHC: 34 g/dL (ref 30.0–36.0)
MCV: 91.1 fL (ref 80.0–100.0)
Monocytes Absolute: 1.1 10*3/uL — ABNORMAL HIGH (ref 0.1–1.0)
Monocytes Relative: 11 %
Neutro Abs: 7.1 10*3/uL (ref 1.7–7.7)
Neutrophils Relative %: 77 %
Platelets: 367 10*3/uL (ref 150–400)
RBC: 4.49 MIL/uL (ref 3.87–5.11)
RDW: 13.6 % (ref 11.5–15.5)
WBC: 9.4 10*3/uL (ref 4.0–10.5)
nRBC: 0 % (ref 0.0–0.2)

## 2019-12-14 LAB — COMPREHENSIVE METABOLIC PANEL
ALT: 12 U/L (ref 0–44)
AST: 14 U/L — ABNORMAL LOW (ref 15–41)
Albumin: 4.2 g/dL (ref 3.5–5.0)
Alkaline Phosphatase: 35 U/L — ABNORMAL LOW (ref 38–126)
Anion gap: 10 (ref 5–15)
BUN: 28 mg/dL — ABNORMAL HIGH (ref 8–23)
CO2: 26 mmol/L (ref 22–32)
Calcium: 9.4 mg/dL (ref 8.9–10.3)
Chloride: 96 mmol/L — ABNORMAL LOW (ref 98–111)
Creatinine, Ser: 0.89 mg/dL (ref 0.44–1.00)
GFR calc Af Amer: >60 mL/min (ref >60)
GFR calc non Af Amer: 56 mL/min — ABNORMAL LOW (ref >60)
Glucose, Bld: 96 mg/dL (ref 70–99)
Potassium: 3.3 mmol/L — ABNORMAL LOW (ref 3.5–5.1)
Sodium: 132 mmol/L — ABNORMAL LOW (ref 135–145)
Total Bilirubin: 0.9 mg/dL (ref 0.3–1.2)
Total Protein: 7.6 g/dL (ref 6.5–8.1)

## 2019-12-14 NOTE — Telephone Encounter (Signed)
Called patient to let her know that her potassium was low and that Dr. Mike Gip wanted her to a high potassium diet. I gave some examples over the phone but I told her that I would mail her a list so she has a better idea of what and how much she should eat to increase her potassium level. Patient understood and had no further questions.

## 2019-12-15 LAB — CANCER ANTIGEN 27.29: CA 27.29: 21.3 U/mL (ref 0.0–38.6)

## 2019-12-16 NOTE — Progress Notes (Signed)
[facility]  [address] [place], [place] [address] Phone: [phone]  Fax: [phone]   Telephone Office Visit:  12/17/2019  Referring physician: Glean Hess, MD  I connected with Donna Santos on 12/17/19 at 3:02 PM by telephone and verified that I was speaking with the correct person using 2 identifiers.  The patient was at  home.  I discussed the limitations, risk, security and privacy concerns of performing an evaluation and management service by telephone and the availability of in person appointments.  I also discussed with the patient that there may be a patient responsible charge related to this service.  The patient expressed understanding and agreed to proceed.   Chief Complaint: Donna Santos is a 86 y.o. female with  stage IV breast cancer who is seen for 6 month assessment   HPI: The patient was last seen in the medical oncology clinic on 06/18/2019. At that time, she was doing well.  Exam revealed post-operative and post-radiation changes.  CA27.29 was 14.0.  She continued Arimidex.  Bone density on 07/25/2019 revealed osteoporosis with a T-score of -3.1 in the right dual femur and -2.6 in the left forearm radius.   Screening left mammogram on 07/25/2019 revealed a possible asymmetry in left breast. There were no findings suspicious for malignancy in the right breast.   Diagnostic left mammogram with left axilla ultrasound on 08/08/2019 showed superimposed fibroglandular tissue. There were no suspicious masses, microcalcifications, or malignancy identified.  Labs on 12/14/2019 revealed a hematocrit 40.9, hemoglobin 13.9, MCV 91.1, platelets 367,000, WBC 9400, ANC 7100. CA 27.29 21.3.  Potassium was 3.3.  CA27.29 was 21.3.  During the interim, she has still been taking her Arimidex with no problems. She doesn't do her monthly breast exams as she should.  She is taking her calcium and Vitamin D daily.   Symptomatically she  is doing "alright".  She had left leg cramps today while sitting down but was able to get up and walk around to alleviate pain.    Past Medical History:  Diagnosis Date  . Arteriosclerosis of coronary artery   . Arthritis of shoulder region   . Breast cancer ([place]) 2002   RT MASTECTOMY  . Cataract   . Essential hypertension   . Glaucoma   . Hypercholesteremia   . Impaired renal function   . Muscle spasms of neck   . Tobacco abuse     Past Surgical History:  Procedure Laterality Date  . CAROTID ENDARTERECTOMY    . CORONARY ARTERY BYPASS GRAFT  2006  . MASS EXCISION Right 08/28/2015   Procedure: EXCISION MASS; remove massive right chest wall;  Surgeon: Florene Glen, MD;  Location: [facility] ORS;  Service: General;  Laterality: Right;  . MASTECTOMY, RADICAL Right 2002   BREAST CA  . TOTAL ABDOMINAL HYSTERECTOMY      Family History  Problem Relation Age of Onset  . Hypertension Father   . Cancer Father   . Breast cancer Mother     Social History:  reports that she quit smoking about 67 years ago. Her smoking use included cigarettes. She has a 2.25 pack-year smoking history. She has never used smokeless tobacco. She reports that she does not drink alcohol or use drugs. She does not drink. She denies any known exposure to radiation or toxins. She is retired by worked at [employer] until her children were born. When they were grown, she worked in [place] with her husband. She lives  in [place]. Her son's name is Chip and daughter-in-law, Judson Roch [phone]) .The patient is accompanied by her son today.  Participants in the patient's visit and their role in the encounter included the patient, Samul Dada, scribe, and [employer], RN today.  The intake visit was provided by Samul Dada, scribe, and  Waymon Budge, RN.   Allergies:  Allergies  Allergen Reactions  . Ace Inhibitors     Other reaction(s): Angioedema  . Brinzolamide-Brimonidine     Current  Medications: Current Outpatient Medications  Medication Sig Dispense Refill  . amLODipine (NORVASC) 5 MG tablet TAKE 1 TABLET BY MOUTH TWICE DAILY 180 tablet 1  . anastrozole (ARIMIDEX) 1 MG tablet TAKE 1 TABLET(1 MG) BY MOUTH DAILY 90 tablet 4  . aspirin EC 81 MG tablet Take 81 mg by mouth daily.    . Calcium Carb-Cholecalciferol (CALCIUM 600+D) 600-800 MG-UNIT TABS Take 1 tablet by mouth daily. Reported on 12/25/2015    . chlorthalidone (HYGROTON) 25 MG tablet TAKE 1 TABLET BY MOUTH DAILY 30 tablet 12  . ELIQUIS 5 MG TABS tablet Take 5 mg by mouth 2 (two) times daily.     Marland Kitchen ibuprofen (ADVIL,MOTRIN) 200 MG tablet Take 200 mg by mouth every 6 (six) hours as needed.    . isosorbide mononitrate (IMDUR) 30 MG 24 hr tablet TAKE 1 TABLET(30 MG) BY MOUTH DAILY 30 tablet 5  . latanoprost (XALATAN) 0.005 % ophthalmic solution INT 1 GTT INTO EACH EYE QHS.  6  . losartan (COZAAR) 25 MG tablet TAKE 1 TABLET(25 MG) BY MOUTH DAILY 30 tablet 12  . metoprolol succinate (TOPROL-XL) 50 MG 24 hr tablet TAKE 1 TABLET(50 MG) BY MOUTH EVERY NIGHT 90 tablet 3  . timolol (TIMOPTIC) 0.25 % ophthalmic solution INSTILL ONE DROP INTO BOTH EYES EVERY MORNING.  6  . TURMERIC PO Take 1 tablet by mouth daily.    . nitroGLYCERIN (NITROSTAT) 0.4 MG SL tablet Place 0.4 mg under the tongue every 5 (five) minutes as needed for chest pain.     No current facility-administered medications for this visit.    Review of Systems  Constitutional: Negative.  Negative for chills, diaphoresis, fever, malaise/fatigue and weight loss.       Doing "fine."  HENT: Negative.  Negative for congestion, hearing loss, sinus pain and sore throat.   Eyes: Negative.  Negative for blurred vision.  Respiratory: Negative.  Negative for cough, shortness of breath and wheezing.   Cardiovascular: Positive for leg swelling (L>R). Negative for chest pain, palpitations, orthopnea and PND.  Gastrointestinal: Negative.  Negative for abdominal pain, blood in  stool, constipation, diarrhea, melena, nausea and vomiting.  Genitourinary: Negative.  Negative for dysuria, frequency, hematuria and urgency.  Musculoskeletal: Positive for myalgias (muscle cramps in left leg). Negative for back pain and joint pain.  Skin: Negative.  Negative for rash.  Neurological: Negative.  Negative for dizziness, tingling, sensory change, weakness and headaches.  Endo/Heme/Allergies: Negative.  Does not bruise/bleed easily.  Psychiatric/Behavioral: Positive for memory loss. Negative for depression and substance abuse. The patient is not nervous/anxious and does not have insomnia.   All other systems reviewed and are negative.  Performance status (ECOG): 1  Vitals There were no vitals taken for this visit.   Physical Exam  Constitutional: She is oriented to person, place, and time.  Neurological: She is alert and oriented to person, place, and time.  Psychiatric: She has a normal mood and affect. Her behavior is normal. Judgment and thought content normal.  Nursing note reviewed.    No visits with results within 3 Day(s) from this visit.  Latest known visit with results is:  Appointment on 12/14/2019  Component Date Value Ref Range Status  . CA 27.29 12/14/2019 21.3  0.0 - 38.6 U/mL Final   Comment: (NOTE) Siemens Centaur Immunochemiluminometric Methodology [employer]) Values obtained with different assay methods or kits cannot be used interchangeably. Results cannot be interpreted as absolute evidence of the presence or absence of malignant disease. Performed At: [facility] [place], [place] [id number] Rush Farmer MD [id number]   . Sodium 12/14/2019 132* 135 - 145 mmol/L Final  . Potassium 12/14/2019 3.3* 3.5 - 5.1 mmol/L Final  . Chloride 12/14/2019 96* 98 - 111 mmol/L Final  . CO2 12/14/2019 26  22 - 32 mmol/L Final  . Glucose, Bld 12/14/2019 96  70 - 99 mg/dL Final  . BUN 12/14/2019 28* 8 - 23 mg/dL Final  . Creatinine, Ser  12/14/2019 0.89  0.44 - 1.00 mg/dL Final  . Calcium 12/14/2019 9.4  8.9 - 10.3 mg/dL Final  . Total Protein 12/14/2019 7.6  6.5 - 8.1 g/dL Final  . Albumin 12/14/2019 4.2  3.5 - 5.0 g/dL Final  . AST 12/14/2019 14* 15 - 41 U/L Final  . ALT 12/14/2019 12  0 - 44 U/L Final  . Alkaline Phosphatase 12/14/2019 35* 38 - 126 U/L Final  . Total Bilirubin 12/14/2019 0.9  0.3 - 1.2 mg/dL Final  . GFR calc non Af Amer 12/14/2019 56* >60 mL/min Final  . GFR calc Af Amer 12/14/2019 >60  >60 mL/min Final  . Anion gap 12/14/2019 10  5 - 15 Final   Performed at [facility], [address], [place], [place] [address]  . WBC 12/14/2019 9.4  4.0 - 10.5 K/uL Final  . RBC 12/14/2019 4.49  3.87 - 5.11 MIL/uL Final  . Hemoglobin 12/14/2019 13.9  12.0 - 15.0 g/dL Final  . HCT 12/14/2019 40.9  36.0 - 46.0 % Final  . MCV 12/14/2019 91.1  80.0 - 100.0 fL Final  . MCH 12/14/2019 31.0  26.0 - 34.0 pg Final  . MCHC 12/14/2019 34.0  30.0 - 36.0 g/dL Final  . RDW 12/14/2019 13.6  11.5 - 15.5 % Final  . Platelets 12/14/2019 367  150 - 400 K/uL Final  . nRBC 12/14/2019 0.0  0.0 - 0.2 % Final  . Neutrophils Relative % 12/14/2019 77  % Final  . Neutro Abs 12/14/2019 7.1  1.7 - 7.7 K/uL Final  . Lymphocytes Relative 12/14/2019 9  % Final  . Lymphs Abs 12/14/2019 0.9  0.7 - 4.0 K/uL Final  . Monocytes Relative 12/14/2019 11  % Final  . Monocytes Absolute 12/14/2019 1.1* 0.1 - 1.0 K/uL Final  . Eosinophils Relative 12/14/2019 2  % Final  . Eosinophils Absolute 12/14/2019 0.2  0.0 - 0.5 K/uL Final  . Basophils Relative 12/14/2019 1  % Final  . Basophils Absolute 12/14/2019 0.1  0.0 - 0.1 K/uL Final  . Immature Granulocytes 12/14/2019 0  % Final  . Abs Immature Granulocytes 12/14/2019 0.04  0.00 - 0.07 K/uL Final   Performed at [facility], [address] [place], [place] [address]    Assessment:  Donna Santos is a 86 y.o. female with recurrent right breast cancer.  She presented with  low grade DCIS of the right breast in 2002.  Pathology revealed a 1.5 cm area of DCIS with 5% early invasion.  Tumor was ER+ 74%, PR -, and HER-2/neu -.   She underwent a right mastectomy and axillary lymph node dissection followed by reconstruction.  Pathology showed no residual carcinoma.  She did not receive tamoxifen.   She developed a right chest wall recurrence in 2016.    She underwent excision.  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  Carcinoma extended to the deep margin.  Tumor was ER+ > 90%, PR + >90% and HER-2/neu -.    She received radiation (5000 cGy with a boost of 2000 cGy). She is currently on Arimidex.   CA 27.29 was 14 on 06/18/2019 and 21.3 on 12/14/2019.  Screening left mammogram on 07/25/2019 revealed a possible asymmetry in left breast.  There were no findings suspicious for malignancy in the right breast.  Diagnostic left mammogram with left axilla ultrasound on 08/08/2019 showed superimposed fibroglandular tissue. There were no suspicious masses, microcalcifications, or malignancy.  Bone density on 09/11/2015 showed osteoporosis with a T-score of -2.5 in the right femur neck.  Bone density on 12/06/2014 showed osteoporosis with a T-score of -2.7 in the right femur neck.  She is currently on calcium and Vitamin D.  She declined bisphosphonates.  Bone density on 07/25/2019 revealed osteoporosis with a T-score of -3.1 in the right dual femur and -2.6 in the left forearm radius.   Symptomatically, she is doing well.  She denies any concerns.  Plan: 1.   Review labs 12/14/2019 2.   Recurrent right breast cancer             Clinically, she appears to be doing well.             Left mammogram on 08/08/2019 revealed no evidence of disease.             Encourage self breast exams.  Continue Arimidex. 3.   Osteoporosis, progressive             Review interval bone density- increasing osteoporosis.              Continue calcium and vitamin D             Consider bisphosphonates or Prolia.   Potential side effects discussed.   Information on Prolia provided.   Discuss need for dental clearance.  Preauth Prolia. 4.   Hypokalemia  Potassium is 3.3.  Discuss potassium rich foods. 5.   Please send copy of labs and bone density to patient. 6.   RTC in 6 months for MD assessment and labs (CBC with diff, CMP, CA27.29).  I discussed the assessment and treatment plan with the patient.  The patient was provided an opportunity to ask questions and all were answered.  The patient agreed with the plan and demonstrated an understanding of the instructions.  The patient was advised to call back if the symptoms worsen or if the condition fails to improve as anticipated.  I provided 14 minutes (3:02 PM - 3:15 PM) of non face-to-face time during this this encounter and > 50% was spent counseling as documented under my assessment and plan.    Lequita Asal, MD, PhD    12/17/2019, 3:15 PM  I, Samul Dada, am acting as a scribe for Lequita Asal, MD.  I, [place] Mike Gip, MD, have reviewed the above documentation for accuracy and completeness, and I agree with the above.

## 2019-12-17 ENCOUNTER — Other Ambulatory Visit: Payer: Medicare Other

## 2019-12-17 ENCOUNTER — Inpatient Hospital Stay (HOSPITAL_BASED_OUTPATIENT_CLINIC_OR_DEPARTMENT_OTHER): Payer: Medicare Other | Admitting: Hematology and Oncology

## 2019-12-17 DIAGNOSIS — C50911 Malignant neoplasm of unspecified site of right female breast: Secondary | ICD-10-CM | POA: Diagnosis not present

## 2019-12-17 DIAGNOSIS — Z170 Estrogen receptor positive status [ER+]: Secondary | ICD-10-CM

## 2019-12-17 DIAGNOSIS — M810 Age-related osteoporosis without current pathological fracture: Secondary | ICD-10-CM | POA: Diagnosis not present

## 2019-12-17 DIAGNOSIS — E876 Hypokalemia: Secondary | ICD-10-CM | POA: Diagnosis not present

## 2019-12-17 DIAGNOSIS — C7989 Secondary malignant neoplasm of other specified sites: Secondary | ICD-10-CM

## 2019-12-17 DIAGNOSIS — C50811 Malignant neoplasm of overlapping sites of right female breast: Secondary | ICD-10-CM | POA: Diagnosis not present

## 2019-12-17 NOTE — Progress Notes (Signed)
Confirmed Name and DOB. Denies any concerns.  

## 2019-12-17 NOTE — Patient Instructions (Signed)
Denosumab injection What is this medicine? DENOSUMAB (den oh sue mab) slows bone breakdown. Prolia is used to treat osteoporosis in women after menopause and in men, and in people who are taking corticosteroids for 6 months or more. Delton See is used to treat a high calcium level due to cancer and to prevent bone fractures and other bone problems caused by multiple myeloma or cancer bone metastases. Delton See is also used to treat giant cell tumor of the bone. This medicine may be used for other purposes; ask your health care provider or pharmacist if you have questions. COMMON BRAND NAME(S): Prolia, XGEVA What should I tell my health care provider before I take this medicine? They need to know if you have any of these conditions:  dental disease  having surgery or tooth extraction  infection  kidney disease  low levels of calcium or Vitamin D in the blood  malnutrition  on hemodialysis  skin conditions or sensitivity  thyroid or parathyroid disease  an unusual reaction to denosumab, other medicines, foods, dyes, or preservatives  pregnant or trying to get pregnant  breast-feeding How should I use this medicine? This medicine is for injection under the skin. It is given by a health care professional in a hospital or clinic setting. A special MedGuide will be given to you before each treatment. Be sure to read this information carefully each time. For Prolia, talk to your pediatrician regarding the use of this medicine in children. Special care may be needed. For Delton See, talk to your pediatrician regarding the use of this medicine in children. While this drug may be prescribed for children as young as 13 years for selected conditions, precautions do apply. Overdosage: If you think you have taken too much of this medicine contact a poison control center or emergency room at once. NOTE: This medicine is only for you. Do not share this medicine with others. What if I miss a dose? It is  important not to miss your dose. Call your doctor or health care professional if you are unable to keep an appointment. What may interact with this medicine? Do not take this medicine with any of the following medications:  other medicines containing denosumab This medicine may also interact with the following medications:  medicines that lower your chance of fighting infection  steroid medicines like prednisone or cortisone This list may not describe all possible interactions. Give your health care provider a list of all the medicines, herbs, non-prescription drugs, or dietary supplements you use. Also tell them if you smoke, drink alcohol, or use illegal drugs. Some items may interact with your medicine. What should I watch for while using this medicine? Visit your doctor or health care professional for regular checks on your progress. Your doctor or health care professional may order blood tests and other tests to see how you are doing. Call your doctor or health care professional for advice if you get a fever, chills or sore throat, or other symptoms of a cold or flu. Do not treat yourself. This drug may decrease your body's ability to fight infection. Try to avoid being around people who are sick. You should make sure you get enough calcium and vitamin D while you are taking this medicine, unless your doctor tells you not to. Discuss the foods you eat and the vitamins you take with your health care professional. See your dentist regularly. Brush and floss your teeth as directed. Before you have any dental work done, tell your dentist you are  receiving this medicine. Do not become pregnant while taking this medicine or for 5 months after stopping it. Talk with your doctor or health care professional about your birth control options while taking this medicine. Women should inform their doctor if they wish to become pregnant or think they might be pregnant. There is a potential for serious side  effects to an unborn child. Talk to your health care professional or pharmacist for more information. What side effects may I notice from receiving this medicine? Side effects that you should report to your doctor or health care professional as soon as possible:  allergic reactions like skin rash, itching or hives, swelling of the face, lips, or tongue  bone pain  breathing problems  dizziness  jaw pain, especially after dental work  redness, blistering, peeling of the skin  signs and symptoms of infection like fever or chills; cough; sore throat; pain or trouble passing urine  signs of low calcium like fast heartbeat, muscle cramps or muscle pain; pain, tingling, numbness in the hands or feet; seizures  unusual bleeding or bruising  unusually weak or tired Side effects that usually do not require medical attention (report to your doctor or health care professional if they continue or are bothersome):  constipation  diarrhea  headache  joint pain  loss of appetite  muscle pain  runny nose  tiredness  upset stomach This list may not describe all possible side effects. Call your doctor for medical advice about side effects. You may report side effects to FDA at 1-800-FDA-1088. Where should I keep my medicine? This medicine is only given in a clinic, doctor's office, or other health care setting and will not be stored at home. NOTE: This sheet is a summary. It may not cover all possible information. If you have questions about this medicine, talk to your doctor, pharmacist, or health care provider.  2020 Elsevier/Gold Standard (2018-03-24 16:10:44)

## 2020-01-01 ENCOUNTER — Telehealth: Payer: Self-pay

## 2020-01-01 NOTE — Telephone Encounter (Signed)
-----   Message from Donna Santos sent at 01/01/2020 11:31 AM EST ----- Regarding: RE: Inj Contact: [phone] Thank you so much ----- Message ----- From: Arlan Organ, RN Sent: 01/01/2020  10:55 AM EST To: Donna Santos Subject: RE: Inj                                        Probably not. I will reach out to them today.  ----- Message ----- From: Donna Santos Sent: 01/01/2020  10:40 AM EST To: Arlan Organ, RN Subject: RE: [name] does she know this? I have no clue. ----- Message ----- From: Arlan Organ, RN Sent: 01/01/2020  10:32 AM EST To: Donna Santos Subject: RE: Inj                                        She will need Dental Clearance prior to getting Prolia.  ----- Message ----- From: Donna Santos Sent: 01/01/2020   9:49 AM EST To: Arlan Organ, RN, # Subject: Inj                                            Patients daughter in law Donna Santos left a msg asking about an inj and how was this patient going to get it. I looked back to the last wrap up and it said to get Prolia pre auth'd. I dont know if she is talking about this inj. Her wrap up said to RTC in 6 months for lab/ MD. Should I add a prolia to that day or is she coming in sooner for Prolia inj?

## 2020-01-01 NOTE — Telephone Encounter (Signed)
Contacted Daughter In [place], Judson Roch and informed her patient will need Dental Clearance prior to getting Prolia. Daughter In Law states she will contact dentist and have her scheduled and will let us know when this has been completed.

## 2020-01-03 ENCOUNTER — Other Ambulatory Visit: Payer: Self-pay | Admitting: Internal Medicine

## 2020-01-13 ENCOUNTER — Other Ambulatory Visit: Payer: Self-pay | Admitting: Internal Medicine

## 2020-01-13 DIAGNOSIS — I2510 Atherosclerotic heart disease of native coronary artery without angina pectoris: Secondary | ICD-10-CM

## 2020-01-29 DIAGNOSIS — Z961 Presence of intraocular lens: Secondary | ICD-10-CM | POA: Diagnosis not present

## 2020-01-29 DIAGNOSIS — H04123 Dry eye syndrome of bilateral lacrimal glands: Secondary | ICD-10-CM | POA: Diagnosis not present

## 2020-01-29 DIAGNOSIS — H0102B Squamous blepharitis left eye, upper and lower eyelids: Secondary | ICD-10-CM | POA: Diagnosis not present

## 2020-01-29 DIAGNOSIS — H401132 Primary open-angle glaucoma, bilateral, moderate stage: Secondary | ICD-10-CM | POA: Diagnosis not present

## 2020-01-29 DIAGNOSIS — H0102A Squamous blepharitis right eye, upper and lower eyelids: Secondary | ICD-10-CM | POA: Diagnosis not present

## 2020-02-09 ENCOUNTER — Other Ambulatory Visit: Payer: Self-pay | Admitting: Internal Medicine

## 2020-03-13 ENCOUNTER — Other Ambulatory Visit: Payer: Self-pay | Admitting: Internal Medicine

## 2020-03-13 DIAGNOSIS — I10 Essential (primary) hypertension: Secondary | ICD-10-CM | POA: Diagnosis not present

## 2020-04-05 ENCOUNTER — Other Ambulatory Visit: Payer: Self-pay | Admitting: Internal Medicine

## 2020-05-27 DIAGNOSIS — H0102A Squamous blepharitis right eye, upper and lower eyelids: Secondary | ICD-10-CM | POA: Diagnosis not present

## 2020-05-27 DIAGNOSIS — H401132 Primary open-angle glaucoma, bilateral, moderate stage: Secondary | ICD-10-CM | POA: Diagnosis not present

## 2020-05-27 DIAGNOSIS — H0102B Squamous blepharitis left eye, upper and lower eyelids: Secondary | ICD-10-CM | POA: Diagnosis not present

## 2020-05-27 DIAGNOSIS — Z961 Presence of intraocular lens: Secondary | ICD-10-CM | POA: Diagnosis not present

## 2020-05-27 DIAGNOSIS — H04123 Dry eye syndrome of bilateral lacrimal glands: Secondary | ICD-10-CM | POA: Diagnosis not present

## 2020-06-15 NOTE — Progress Notes (Signed)
[facility]  [address] [place], [place] [address] Phone: [phone]  Fax: [phone]   Clinic Day:  06/16/2020  Referring physician: Glean Hess, MD  Chief Complaint: Donna Santos is a 86 y.o. female with  stage IV breast cancer who is seen for 6 month assessment   HPI: The patient was last seen in the medical oncology clinic on 12/17/2019 for a telephone visit. At that time, she was doing well. She denied any concerns. Hematocrit was 40.9, hemoglobin 13.9, platelets 367,000, WBC 9,400. Sodium was 132, potassium 3.3, AST 14, alkaline phosphatase was 35. CA 27.29 was 21.3. We discussed Prolia secondary to her progressive osteoporosis. She continued on Arimidex.   During the interim, she has felt good. She continues to take Arimidex and is tolerating it well. She has been taking vitamin D 5,000 mg BID; not controlled by PCP. I encouraged her to take the standard 800 IU of vitamin D daily.  She should follow-up with her PCP to check her vitamin D level.  She has no breast concerns. She does not examine her breast monthly. She is on a low sodium diet. She does not drink a lot of plain water. She prefers flavored water over plain water.   She decided that she did not want to receive with Prolia.  She has not received the COVID-19 vaccine yet because she does not like shots. Donna Santos notes she is not at high risk for exposure. The patient's children visit often; she does not get out much.    Past Medical History:  Diagnosis Date  . Arteriosclerosis of coronary artery   . Arthritis of shoulder region   . Breast cancer ([place]) 2002   RT MASTECTOMY  . Cataract   . Essential hypertension   . Glaucoma   . Hypercholesteremia   . Impaired renal function   . Muscle spasms of neck   . Tobacco abuse     Past Surgical History:  Procedure Laterality Date  . CAROTID ENDARTERECTOMY    . CORONARY ARTERY BYPASS GRAFT  2006  . MASS EXCISION Right  08/28/2015   Procedure: EXCISION MASS; remove massive right chest wall;  Surgeon: Florene Glen, MD;  Location: [facility] ORS;  Service: General;  Laterality: Right;  . MASTECTOMY, RADICAL Right 2002   BREAST CA  . TOTAL ABDOMINAL HYSTERECTOMY      Family History  Problem Relation Age of Onset  . Hypertension Father   . Cancer Father   . Breast cancer Mother     Social History:  reports that she quit smoking about 67 years ago. Her smoking use included cigarettes. She has a 2.25 pack-year smoking history. She has never used smokeless tobacco. She reports that she does not drink alcohol and does not use drugs. She does not drink. She denies any known exposure to radiation or toxins. She is retired by worked at [employer] until her children were born. When they were grown, she worked in [place] with her husband. She lives in [place]. Her son's name is Donna Santos and daughter-in-law, Donna Santos [phone]) .The patient is accompanied by Donna Santos via iPad today.   Allergies:  Allergies  Allergen Reactions  . Ace Inhibitors     Other reaction(s): Angioedema  . Brinzolamide-Brimonidine     Current Medications: Current Outpatient Medications  Medication Sig Dispense Refill  . amLODipine (NORVASC) 5 MG tablet TAKE 1 TABLET BY MOUTH TWICE DAILY 180 tablet 1  . anastrozole (ARIMIDEX) 1 MG tablet  TAKE 1 TABLET(1 MG) BY MOUTH DAILY 90 tablet 4  . aspirin EC 81 MG tablet Take 81 mg by mouth daily.    . Calcium Carb-Cholecalciferol (CALCIUM 600+D) 600-800 MG-UNIT TABS Take 1 tablet by mouth daily. Reported on 12/25/2015    . chlorthalidone (HYGROTON) 25 MG tablet TAKE 1 TABLET BY MOUTH DAILY 30 tablet 12  . diclofenac Sodium (VOLTAREN) 1 % GEL Apply topically.    Marland Kitchen ELIQUIS 5 MG TABS tablet Take 5 mg by mouth 2 (two) times daily.     Marland Kitchen ibuprofen (ADVIL,MOTRIN) 200 MG tablet Take 200 mg by mouth every 6 (six) hours as needed.    . isosorbide mononitrate (IMDUR) 30 MG 24 hr tablet TAKE 1 TABLET(30 MG)  BY MOUTH DAILY 30 tablet 5  . latanoprost (XALATAN) 0.005 % ophthalmic solution INT 1 GTT INTO EACH EYE QHS.  6  . losartan (COZAAR) 25 MG tablet TAKE 1 TABLET(25 MG) BY MOUTH DAILY 30 tablet 12  . metoprolol succinate (TOPROL-XL) 50 MG 24 hr tablet TAKE 1 TABLET BY MOUTH EVERY NIGHT 90 tablet 3  . timolol (TIMOPTIC) 0.25 % ophthalmic solution INSTILL ONE DROP INTO BOTH EYES EVERY MORNING.  6  . TURMERIC PO Take 1 tablet by mouth daily.    . Turmeric POWD Take by mouth.    . nitroGLYCERIN (NITROSTAT) 0.4 MG SL tablet Place 0.4 mg under the tongue every 5 (five) minutes as needed for chest pain. (Patient not taking: Reported on 06/16/2020)     No current facility-administered medications for this visit.    Review of Systems  Constitutional: Positive for weight loss (5 lbs since 06/18/2019). Negative for chills, diaphoresis, fever and malaise/fatigue.       Feels good.  HENT: Negative.  Negative for congestion, hearing loss, sinus pain and sore throat.   Eyes: Negative.  Negative for blurred vision.  Respiratory: Negative.  Negative for cough, shortness of breath and wheezing.   Cardiovascular: Negative for chest pain, palpitations, orthopnea, leg swelling and PND.  Gastrointestinal: Negative.  Negative for abdominal pain, blood in stool, constipation, diarrhea, melena, nausea and vomiting.       Low sodium diet.  Genitourinary: Negative.  Negative for dysuria, frequency, hematuria and urgency.  Musculoskeletal: Negative for back pain, joint pain and myalgias.  Skin: Negative.  Negative for rash.  Neurological: Negative.  Negative for dizziness, tingling, sensory change, weakness and headaches.  Endo/Heme/Allergies: Negative.  Does not bruise/bleed easily.  Psychiatric/Behavioral: Positive for memory loss. Negative for depression and substance abuse. The patient is not nervous/anxious and does not have insomnia.   All other systems reviewed and are negative.  Performance status (ECOG):  1  Vitals Blood pressure (!) 152/75, pulse 82, temperature (!) 97 F (36.1 C), temperature source Tympanic, weight 133 lb 8 oz (60.6 kg), SpO2 98 %.   Physical Exam Nursing note reviewed.  Constitutional:      General: She is not in acute distress.    Appearance: Normal appearance.     Interventions: Face mask in place.     Comments: She has a cane by her side.  HENT:     Head:     Comments: Short white hair.    Mouth/Throat:     Mouth: Mucous membranes are moist.     Pharynx: Oropharynx is clear.  Eyes:     General: No scleral icterus.    Extraocular Movements: Extraocular movements intact.     Conjunctiva/sclera: Conjunctivae normal.     Pupils: Pupils are  equal, round, and reactive to light.     Comments: Glasses.  Blue eyes.  Cardiovascular:     Rate and Rhythm: Normal rate and regular rhythm.     Pulses: Normal pulses.     Heart sounds: Normal heart sounds. No murmur heard.   Pulmonary:     Effort: Pulmonary effort is normal. No respiratory distress.     Breath sounds: Normal breath sounds. No wheezing or rales.  Chest:     Chest wall: No tenderness.     Breasts:        Right: Absent.        Left: Skin change (fibrocystic changes in the upper breast) and tenderness present.     Comments: S/p right sided mastectomy with capillary web. Abdominal:     General: Bowel sounds are normal.     Palpations: Abdomen is soft. There is no mass.     Tenderness: There is no abdominal tenderness. There is no guarding.  Musculoskeletal:        General: No swelling or tenderness. Normal range of motion.     Cervical back: Normal range of motion and neck supple.     Left lower leg: Edema (ankle, mild) present.  Lymphadenopathy:     Head:     Right side of head: No preauricular, posterior auricular or occipital adenopathy.     Left side of head: No preauricular, posterior auricular or occipital adenopathy.     Cervical: No cervical adenopathy.     Right cervical: No superficial  cervical adenopathy.    Left cervical: No superficial cervical adenopathy.     Upper Body:     Right upper body: No supraclavicular or axillary adenopathy.     Left upper body: No supraclavicular or axillary adenopathy.     Lower Body: No right inguinal adenopathy. No left inguinal adenopathy.  Skin:    General: Skin is warm and dry.  Neurological:     Mental Status: She is alert and oriented to person, place, and time. Mental status is at baseline.  Psychiatric:        Mood and Affect: Mood normal.        Behavior: Behavior normal.        Thought Content: Thought content normal.        Judgment: Judgment normal.    Appointment on 06/16/2020  Component Date Value Ref Range Status  . Sodium 06/16/2020 128* 135 - 145 mmol/L Final  . Potassium 06/16/2020 3.5  3.5 - 5.1 mmol/L Final  . Chloride 06/16/2020 91* 98 - 111 mmol/L Final  . CO2 06/16/2020 26  22 - 32 mmol/L Final  . Glucose, Bld 06/16/2020 95  70 - 99 mg/dL Final   Glucose reference range applies only to samples taken after fasting for at least 8 hours.  . BUN 06/16/2020 19  8 - 23 mg/dL Final  . Creatinine, Ser 06/16/2020 0.93  0.44 - 1.00 mg/dL Final  . Calcium 06/16/2020 9.3  8.9 - 10.3 mg/dL Final  . Total Protein 06/16/2020 7.2  6.5 - 8.1 g/dL Final  . Albumin 06/16/2020 4.1  3.5 - 5.0 g/dL Final  . AST 06/16/2020 19  15 - 41 U/L Final  . ALT 06/16/2020 18  0 - 44 U/L Final  . Alkaline Phosphatase 06/16/2020 39  38 - 126 U/L Final  . Total Bilirubin 06/16/2020 0.9  0.3 - 1.2 mg/dL Final  . GFR calc non Af Amer 06/16/2020 53* >60 mL/min Final  . GFR  calc Af Amer 06/16/2020 >60  >60 mL/min Final  . Anion gap 06/16/2020 11  5 - 15 Final   Performed at [facility], [address] [place], [place] [address]  . WBC 06/16/2020 7.1  4.0 - 10.5 K/uL Final  . RBC 06/16/2020 4.54  3.87 - 5.11 MIL/uL Final  . Hemoglobin 06/16/2020 14.0  12.0 - 15.0 g/dL Final  . HCT 06/16/2020 41.1  36 - 46 % Final  . MCV  06/16/2020 90.5  80.0 - 100.0 fL Final  . MCH 06/16/2020 30.8  26.0 - 34.0 pg Final  . MCHC 06/16/2020 34.1  30.0 - 36.0 g/dL Final  . RDW 06/16/2020 13.1  11.5 - 15.5 % Final  . Platelets 06/16/2020 375  150 - 400 K/uL Final  . nRBC 06/16/2020 0.0  0.0 - 0.2 % Final  . Neutrophils Relative % 06/16/2020 71  % Final  . Neutro Abs 06/16/2020 5.1  1.7 - 7.7 K/uL Final  . Lymphocytes Relative 06/16/2020 12  % Final  . Lymphs Abs 06/16/2020 0.9  0.7 - 4.0 K/uL Final  . Monocytes Relative 06/16/2020 12  % Final  . Monocytes Absolute 06/16/2020 0.8  0 - 1 K/uL Final  . Eosinophils Relative 06/16/2020 2  % Final  . Eosinophils Absolute 06/16/2020 0.1  0 - 0 K/uL Final  . Basophils Relative 06/16/2020 2  % Final  . Basophils Absolute 06/16/2020 0.1  0 - 0 K/uL Final  . Immature Granulocytes 06/16/2020 1  % Final  . Abs Immature Granulocytes 06/16/2020 0.05  0.00 - 0.07 K/uL Final   Performed at [facility], [address], [place], [place] [address]    Assessment:  TIMICA MARCOM is a 86 y.o. female with recurrent right breast cancer.  She presented with low grade DCIS of the right breast in 2002.  Pathology revealed a 1.5 cm area of DCIS with 5% early invasion.  Tumor was ER+ 74%, PR -, and HER-2/neu -.   She underwent a right mastectomy and axillary lymph node dissection followed by reconstruction.  Pathology showed no residual carcinoma.  She did not receive tamoxifen.   She developed a right chest wall recurrence in 2016.    She underwent excision.  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  Carcinoma extended to the deep margin.  Tumor was ER+ > 90%, PR + >90% and HER-2/neu -.    She received radiation (5000 cGy with a boost of 2000 cGy). She is currently on Arimidex.   CA 27.29 was 14 on 06/18/2019 and 21.3 on 12/14/2019.  Screening left mammogram on 07/25/2019 revealed a possible asymmetry in left  breast.  There were no findings suspicious for malignancy in the right breast.  Diagnostic left mammogram with left axilla ultrasound on 08/08/2019 showed superimposed fibroglandular tissue. There were no suspicious masses, microcalcifications, or malignancy.  Bone density on 09/11/2015 showed osteoporosis with a T-score of -2.5 in the right femur neck.  Bone density on 12/06/2014 showed osteoporosis with a T-score of -2.7 in the right femur neck.  She is currently on calcium and Vitamin D.  She declined bisphosphonates.  Bone density on 07/25/2019 revealed osteoporosis with a T-score of -3.1 in the right dual femur and -2.6 in the left forearm radius.   Symptomatically, she feels "fine".  She is tolerating Arimidex well.  Plan: 1.   Labs today: CBC with diff, CMP, CA 27.29. 2.  Recurrent right breast cancer             Clinically, she is doing well             Exam reveals no evidence of recurrent disease.    Diagnostic left mammogram and left axillary ultrasound on 08/08/2019 revealed no suspicious masses.             Continue Arimidex. 3.   Osteoporosis, progressive             Review bone density from last visit which showed increasing osteoporosis.   T score has increased from -2.5 to -3.1             Encourage patient to take calcium and vitamin D.  Patient notes dental clearance.  However she declines to receive Prolia.  She discussed with her family.             Continue to monitor  4.   Hypokalemia, resolved  Potassium is 3.5.  Continue to monitor. 5.   Hyponatremia  Sodium 128 today.  Sodium has ranged between 128- 132 in the past year.  Patient notes a low sodium diet. 6.   Left mammogram on 08/08/2020. 7.   RTC in 6 months for MD assessment and labs (CBC with diff, CMP, CA 27-29).  I discussed the assessment and treatment plan with the patient.  The patient was provided an opportunity to ask questions and all were answered.  The patient agreed with the plan and demonstrated  an understanding of the instructions.  The patient was advised to call back if the symptoms worsen or if the condition fails to improve as anticipated.   Lequita Asal, MD, PhD    06/16/2020, 11:10 AM  I, Selena Batten, am acting as scribe for [employer]. Mike Gip, MD, PhD.  I, Kenshawn Maciolek C. Mike Gip, MD, have reviewed the above documentation for accuracy and completeness, and I agree with the above.

## 2020-06-16 ENCOUNTER — Other Ambulatory Visit: Payer: Self-pay

## 2020-06-16 ENCOUNTER — Inpatient Hospital Stay: Payer: Medicare Other | Attending: Hematology and Oncology | Admitting: Hematology and Oncology

## 2020-06-16 ENCOUNTER — Inpatient Hospital Stay: Payer: Medicare Other

## 2020-06-16 ENCOUNTER — Telehealth: Payer: Self-pay

## 2020-06-16 ENCOUNTER — Encounter: Payer: Self-pay | Admitting: Hematology and Oncology

## 2020-06-16 VITALS — BP 152/75 | HR 82 | Temp 97.0°F | Wt 133.5 lb

## 2020-06-16 DIAGNOSIS — C50811 Malignant neoplasm of overlapping sites of right female breast: Secondary | ICD-10-CM | POA: Insufficient documentation

## 2020-06-16 DIAGNOSIS — E871 Hypo-osmolality and hyponatremia: Secondary | ICD-10-CM | POA: Diagnosis not present

## 2020-06-16 DIAGNOSIS — Z923 Personal history of irradiation: Secondary | ICD-10-CM | POA: Diagnosis not present

## 2020-06-16 DIAGNOSIS — Z9011 Acquired absence of right breast and nipple: Secondary | ICD-10-CM | POA: Insufficient documentation

## 2020-06-16 DIAGNOSIS — C7989 Secondary malignant neoplasm of other specified sites: Secondary | ICD-10-CM

## 2020-06-16 DIAGNOSIS — C50911 Malignant neoplasm of unspecified site of right female breast: Secondary | ICD-10-CM

## 2020-06-16 DIAGNOSIS — E876 Hypokalemia: Secondary | ICD-10-CM | POA: Diagnosis not present

## 2020-06-16 DIAGNOSIS — Z87891 Personal history of nicotine dependence: Secondary | ICD-10-CM | POA: Diagnosis not present

## 2020-06-16 DIAGNOSIS — M810 Age-related osteoporosis without current pathological fracture: Secondary | ICD-10-CM | POA: Insufficient documentation

## 2020-06-16 DIAGNOSIS — Z86000 Personal history of in-situ neoplasm of breast: Secondary | ICD-10-CM | POA: Diagnosis not present

## 2020-06-16 LAB — CBC WITH DIFFERENTIAL/PLATELET
Abs Immature Granulocytes: 0.05 10*3/uL (ref 0.00–0.07)
Basophils Absolute: 0.1 10*3/uL (ref 0.0–0.1)
Basophils Relative: 2 %
Eosinophils Absolute: 0.1 10*3/uL (ref 0.0–0.5)
Eosinophils Relative: 2 %
HCT: 41.1 % (ref 36.0–46.0)
Hemoglobin: 14 g/dL (ref 12.0–15.0)
Immature Granulocytes: 1 %
Lymphocytes Relative: 12 %
Lymphs Abs: 0.9 10*3/uL (ref 0.7–4.0)
MCH: 30.8 pg (ref 26.0–34.0)
MCHC: 34.1 g/dL (ref 30.0–36.0)
MCV: 90.5 fL (ref 80.0–100.0)
Monocytes Absolute: 0.8 10*3/uL (ref 0.1–1.0)
Monocytes Relative: 12 %
Neutro Abs: 5.1 10*3/uL (ref 1.7–7.7)
Neutrophils Relative %: 71 %
Platelets: 375 10*3/uL (ref 150–400)
RBC: 4.54 MIL/uL (ref 3.87–5.11)
RDW: 13.1 % (ref 11.5–15.5)
WBC: 7.1 10*3/uL (ref 4.0–10.5)
nRBC: 0 % (ref 0.0–0.2)

## 2020-06-16 LAB — COMPREHENSIVE METABOLIC PANEL
ALT: 18 U/L (ref 0–44)
AST: 19 U/L (ref 15–41)
Albumin: 4.1 g/dL (ref 3.5–5.0)
Alkaline Phosphatase: 39 U/L (ref 38–126)
Anion gap: 11 (ref 5–15)
BUN: 19 mg/dL (ref 8–23)
CO2: 26 mmol/L (ref 22–32)
Calcium: 9.3 mg/dL (ref 8.9–10.3)
Chloride: 91 mmol/L — ABNORMAL LOW (ref 98–111)
Creatinine, Ser: 0.93 mg/dL (ref 0.44–1.00)
GFR calc Af Amer: >60 mL/min (ref >60)
GFR calc non Af Amer: 53 mL/min — ABNORMAL LOW (ref >60)
Glucose, Bld: 95 mg/dL (ref 70–99)
Potassium: 3.5 mmol/L (ref 3.5–5.1)
Sodium: 128 mmol/L — ABNORMAL LOW (ref 135–145)
Total Bilirubin: 0.9 mg/dL (ref 0.3–1.2)
Total Protein: 7.2 g/dL (ref 6.5–8.1)

## 2020-06-16 NOTE — Progress Notes (Signed)
No new changes noted today 

## 2020-06-16 NOTE — Telephone Encounter (Signed)
The patient labs has been faxed to her PCP office due to low sodium. The patient was informed, she was understanding and agreeable.

## 2020-06-17 LAB — CANCER ANTIGEN 27.29: CA 27.29: 13.5 U/mL (ref 0.0–38.6)

## 2020-06-18 ENCOUNTER — Telehealth: Payer: Self-pay | Admitting: *Deleted

## 2020-06-18 ENCOUNTER — Telehealth: Payer: Self-pay | Admitting: Internal Medicine

## 2020-06-18 NOTE — Telephone Encounter (Signed)
-----   Message from [place], [place] sent at 06/16/2020  3:30 PM EDT ----- Per Dr Mike Gip she would like for you to review the patient labs, Sodium is a little low. Thanks [place], [place]

## 2020-06-18 NOTE — Telephone Encounter (Signed)
Daughter in law called and said that pharmacy told her that patient is not eligible for more Arimidex because she has completed 5 years of therapy. Per office note dated 06/16/20 she is to continue Arimidex. She is asking if she is to stop it or continue it. Please advise

## 2020-06-18 NOTE — Telephone Encounter (Signed)
Please call the patient to let her know that I reviewed her sodium from Dr. Mike Gip.  It is slightly low but not dangerously so.  It has been like this in the past as well.   If the sodium continues to be low, she may need to come in for some tests to see what the cause is. If she does not have any upcoming labs, then make an appointment with me in about 2 months to follow up.

## 2020-06-19 NOTE — Telephone Encounter (Signed)
Tried calling pt daughter Avaiyah Strubel. No answer so left VM for her to call back to read Dr Oneal Deputy note and schedule the patient for a 2 month follow up and sodium recheck.  Awaiting call back.  CM

## 2020-06-19 NOTE — Telephone Encounter (Signed)
Crm created just in case patient calls back to the [place].

## 2020-06-23 ENCOUNTER — Other Ambulatory Visit: Payer: Self-pay | Admitting: *Deleted

## 2020-06-23 ENCOUNTER — Telehealth: Payer: Self-pay | Admitting: Internal Medicine

## 2020-06-23 MED ORDER — ANASTROZOLE 1 MG PO TABS: ORAL_TABLET | ORAL | 1 refills | Status: DC

## 2020-06-23 NOTE — Telephone Encounter (Signed)
Patient's son called to ask the nurse or doctor if it is necessary to bring patient in because of a fall she had yesterday.  He stated that it was minor with no apparent injuries, but patient is now complaining of knee, wrist and bottom pain.  He is not sure if he should bring her in.  Please call to discuss at [phone]

## 2020-06-23 NOTE — Telephone Encounter (Signed)
Spoke to pt she sated she was doing fine sis advise to set up appt or go to [facility] if having pain

## 2020-06-24 ENCOUNTER — Ambulatory Visit: Payer: Medicare Other | Admitting: Internal Medicine

## 2020-06-24 DIAGNOSIS — S0990XA Unspecified injury of head, initial encounter: Secondary | ICD-10-CM | POA: Diagnosis not present

## 2020-06-24 DIAGNOSIS — Z87891 Personal history of nicotine dependence: Secondary | ICD-10-CM | POA: Diagnosis not present

## 2020-06-24 DIAGNOSIS — M25551 Pain in right hip: Secondary | ICD-10-CM | POA: Diagnosis not present

## 2020-06-24 DIAGNOSIS — I2510 Atherosclerotic heart disease of native coronary artery without angina pectoris: Secondary | ICD-10-CM | POA: Diagnosis not present

## 2020-06-24 DIAGNOSIS — S79911A Unspecified injury of right hip, initial encounter: Secondary | ICD-10-CM | POA: Diagnosis not present

## 2020-06-24 DIAGNOSIS — S40022A Contusion of left upper arm, initial encounter: Secondary | ICD-10-CM | POA: Diagnosis not present

## 2020-06-24 DIAGNOSIS — G8911 Acute pain due to trauma: Secondary | ICD-10-CM | POA: Diagnosis not present

## 2020-06-24 DIAGNOSIS — R9089 Other abnormal findings on diagnostic imaging of central nervous system: Secondary | ICD-10-CM | POA: Diagnosis not present

## 2020-06-24 DIAGNOSIS — I6782 Cerebral ischemia: Secondary | ICD-10-CM | POA: Diagnosis not present

## 2020-06-24 DIAGNOSIS — M79621 Pain in right upper arm: Secondary | ICD-10-CM | POA: Diagnosis not present

## 2020-06-24 DIAGNOSIS — M545 Low back pain: Secondary | ICD-10-CM | POA: Diagnosis not present

## 2020-06-24 DIAGNOSIS — Z7982 Long term (current) use of aspirin: Secondary | ICD-10-CM | POA: Diagnosis not present

## 2020-06-24 DIAGNOSIS — S3993XA Unspecified injury of pelvis, initial encounter: Secondary | ICD-10-CM | POA: Diagnosis not present

## 2020-06-24 DIAGNOSIS — R9431 Abnormal electrocardiogram [ECG] [EKG]: Secondary | ICD-10-CM | POA: Diagnosis not present

## 2020-06-24 DIAGNOSIS — I4891 Unspecified atrial fibrillation: Secondary | ICD-10-CM | POA: Diagnosis not present

## 2020-06-24 DIAGNOSIS — M533 Sacrococcygeal disorders, not elsewhere classified: Secondary | ICD-10-CM | POA: Diagnosis not present

## 2020-06-24 DIAGNOSIS — I10 Essential (primary) hypertension: Secondary | ICD-10-CM | POA: Diagnosis not present

## 2020-06-24 DIAGNOSIS — M25561 Pain in right knee: Secondary | ICD-10-CM | POA: Diagnosis not present

## 2020-06-24 DIAGNOSIS — M1991 Primary osteoarthritis, unspecified site: Secondary | ICD-10-CM | POA: Diagnosis not present

## 2020-06-24 DIAGNOSIS — Z853 Personal history of malignant neoplasm of breast: Secondary | ICD-10-CM | POA: Diagnosis not present

## 2020-06-24 DIAGNOSIS — Z79811 Long term (current) use of aromatase inhibitors: Secondary | ICD-10-CM | POA: Diagnosis not present

## 2020-06-24 DIAGNOSIS — Z951 Presence of aortocoronary bypass graft: Secondary | ICD-10-CM | POA: Diagnosis not present

## 2020-06-24 DIAGNOSIS — Z79899 Other long term (current) drug therapy: Secondary | ICD-10-CM | POA: Diagnosis not present

## 2020-06-24 DIAGNOSIS — I4510 Unspecified right bundle-branch block: Secondary | ICD-10-CM | POA: Diagnosis not present

## 2020-06-24 NOTE — Telephone Encounter (Signed)
Pt's daughter in law called back in to schedule follow up. She would like for pt to be seen by provider to make sure that pt is okay. Scheduled pt an apt with PCP today per request.

## 2020-06-24 NOTE — Telephone Encounter (Signed)
Patient scheduled today at 4pm.  CM

## 2020-06-25 ENCOUNTER — Telehealth: Payer: Self-pay | Admitting: Internal Medicine

## 2020-06-25 DIAGNOSIS — I6782 Cerebral ischemia: Secondary | ICD-10-CM | POA: Diagnosis not present

## 2020-06-25 DIAGNOSIS — S0990XA Unspecified injury of head, initial encounter: Secondary | ICD-10-CM | POA: Diagnosis not present

## 2020-06-25 DIAGNOSIS — S79911A Unspecified injury of right hip, initial encounter: Secondary | ICD-10-CM | POA: Diagnosis not present

## 2020-06-25 DIAGNOSIS — S3993XA Unspecified injury of pelvis, initial encounter: Secondary | ICD-10-CM | POA: Diagnosis not present

## 2020-06-25 DIAGNOSIS — R9089 Other abnormal findings on diagnostic imaging of central nervous system: Secondary | ICD-10-CM | POA: Diagnosis not present

## 2020-06-25 NOTE — Telephone Encounter (Signed)
Copied from [place] [id number]. Topic: General - Other >> Jun 25, 2020 10:43 AM Donna Santos wrote: Reason for CRM: pt daughter Donna Santos is calling  and he mother went to [facility] yesterday and was told to do er follow up and daughter would like to know if its necessary. Pt daughter would like a callback. Pt had a lot of test done yesterday

## 2020-06-30 ENCOUNTER — Ambulatory Visit (INDEPENDENT_AMBULATORY_CARE_PROVIDER_SITE_OTHER): Payer: Medicare Other

## 2020-06-30 DIAGNOSIS — Z0000 Encounter for general adult medical examination without abnormal findings: Secondary | ICD-10-CM

## 2020-06-30 NOTE — Progress Notes (Signed)
Subjective:   Donna Santos is a 86 y.o. female who presents for Medicare Annual (Subsequent) preventive examination.  Virtual Visit via Telephone Note  I connected with  Donna Santos on 06/30/20 at 10:40 AM EDT by telephone and verified that I am speaking with the correct person using two identifiers.  Medicare Annual Wellness visit completed telephonically due to Covid-19 pandemic.   Location: Patient: home Provider: office   I discussed the limitations, risks, security and privacy concerns of performing an evaluation and management service by telephone and the availability of in person appointments. The patient expressed understanding and agreed to proceed.  Unable to perform video visit due to video visit attempted and failed and/or patient does not have video capability.   Some vital signs may be absent or patient reported.   Donna Marker, LPN    Review of Systems     Cardiac Risk Factors include: advanced age (>9men, >22 women);hypertension;sedentary lifestyle     Objective:    Today's Vitals   06/30/20 1043  PainSc: 8    There is no height or weight on file to calculate BMI.  Advanced Directives 06/16/2020 12/17/2019 06/18/2019 05/28/2019 06/20/2018 12/20/2017 09/15/2017  Does Patient Have a Medical Advance Directive? No Yes Yes Yes No Yes Yes;No  Type of Advance Directive - Healthcare Power of Donna of facility DNR (pink MOST or yellow form) Donna Santos;Donna of facility DNR (pink MOST or yellow form) - - -  Does patient want to make changes to medical advance directive? - - - - - No - Patient declined Yes (MAU/Ambulatory/Procedural Areas - Information given)  Copy of Donna Santos in Chart? - No - copy requested - No - copy requested - - -  Would patient like information on creating a medical advance directive? No - Patient declined No - Patient declined - - No - Patient declined - -     Current Medications (verified) Outpatient Encounter Medications as of 06/30/2020  Medication Sig  . amLODipine (NORVASC) 5 MG tablet TAKE 1 TABLET BY MOUTH TWICE DAILY  . anastrozole (ARIMIDEX) 1 MG tablet TAKE 1 TABLET(1 MG) BY MOUTH DAILY  . aspirin EC 81 MG tablet Take 81 mg by mouth daily.  . Calcium Carb-Cholecalciferol (CALCIUM 600+D) 600-800 MG-UNIT TABS Take 1 tablet by mouth daily. Reported on 12/25/2015  . chlorthalidone (HYGROTON) 25 MG tablet TAKE 1 TABLET BY MOUTH DAILY  . Cholecalciferol (VITAMIN D-3) 125 MCG (5000 UT) TABS Take 1 tablet by mouth daily.  Marland Kitchen ELIQUIS 5 MG TABS tablet Take 5 mg by mouth 2 (two) times daily.   . isosorbide mononitrate (IMDUR) 30 MG 24 hr tablet TAKE 1 TABLET(30 MG) BY MOUTH DAILY  . latanoprost (XALATAN) 0.005 % ophthalmic solution INT 1 GTT INTO EACH EYE QHS.  Marland Kitchen losartan (COZAAR) 25 MG tablet TAKE 1 TABLET(25 MG) BY MOUTH DAILY  . metoprolol succinate (TOPROL-XL) 50 MG 24 hr tablet TAKE 1 TABLET BY MOUTH EVERY NIGHT  . timolol (TIMOPTIC) 0.25 % ophthalmic solution INSTILL ONE DROP INTO BOTH EYES EVERY MORNING.  . TURMERIC PO Take 1 tablet by mouth daily.  . diclofenac Sodium (VOLTAREN) 1 % GEL Apply topically. (Patient not taking: Reported on 06/30/2020)  . ibuprofen (ADVIL,MOTRIN) 200 MG tablet Take 200 mg by mouth every 6 (six) hours as needed. (Patient not taking: Reported on 06/30/2020)  . nitroGLYCERIN (NITROSTAT) 0.4 MG SL tablet Place 0.4 mg under the tongue every 5 (five) minutes  as needed for chest pain. (Patient not taking: Reported on 06/16/2020)  . [DISCONTINUED] Turmeric POWD Take by mouth.   No facility-administered encounter medications on file as of 06/30/2020.    Allergies (verified) Ace inhibitors and Brinzolamide-brimonidine   History: Past Medical History:  Diagnosis Date  . Arteriosclerosis of coronary artery   . Arthritis of shoulder region   . Breast cancer ([place]) 2002   RT MASTECTOMY  . Cataract   . Essential  hypertension   . Glaucoma   . Hypercholesteremia   . Impaired renal function   . Muscle spasms of neck   . Tobacco abuse    Past Surgical History:  Procedure Laterality Date  . CAROTID ENDARTERECTOMY    . CORONARY ARTERY BYPASS GRAFT  2006  . MASS EXCISION Right 08/28/2015   Procedure: EXCISION MASS; remove massive right chest wall;  Surgeon: Florene Glen, MD;  Location: [facility] ORS;  Service: General;  Laterality: Right;  . MASTECTOMY, RADICAL Right 2002   BREAST CA  . TOTAL ABDOMINAL HYSTERECTOMY     Family History  Problem Relation Age of Onset  . Hypertension Father   . Cancer Father   . Breast cancer Mother    Social History   Socioeconomic History  . Marital status: Widowed    Spouse name: Not on file  . Number of children: 6  . Years of education: Not on file  . Highest education level: Not on file  Occupational History  . Not on file  Tobacco Use  . Smoking status: Former Smoker    Packs/day: 1.50    Years: 1.50    Pack years: 2.25    Types: Cigarettes    Quit date: 10/04/1952    Years since quitting: 67.7  . Smokeless tobacco: Never Used  Vaping Use  . Vaping Use: Never used  Substance and Sexual Activity  . Alcohol use: No    Alcohol/week: 0.0 standard drinks  . Drug use: No  . Sexual activity: Not on file  Other Topics Concern  . Not on file  Social History Narrative  . Not on file   Social Determinants of Health   Financial Resource Strain: Low Risk   . Difficulty of Paying Living Expenses: Not hard at all  Food Insecurity: No Food Insecurity  . Worried About Charity fundraiser in the Last Year: Never true  . Ran Donna of Food in the Last Year: Never true  Transportation Needs: No Transportation Needs  . Lack of Transportation (Medical): No  . Lack of Transportation (Non-Medical): No  Physical Activity: Inactive  . Days of Exercise per Week: 0 days  . Minutes of Exercise per Session: 0 min  Stress: No Stress Concern Present  . Feeling of  Stress : Not at all  Social Connections: Moderately Isolated  . Frequency of Communication with Friends and Family: More than three times a week  . Frequency of Social Gatherings with Friends and Family: Three times a week  . Attends Religious Services: More than 4 times per year  . Active Member of Clubs or Organizations: No  . Attends Archivist Meetings: Never  . Marital Status: Widowed    Tobacco Counseling Counseling given: Not Answered   Clinical Intake:  Pre-visit preparation completed: Yes  Pain : 0-10 Pain Score: 8  Pain Type: Acute pain Pain Location: Hip Pain Orientation: Right Pain Descriptors / Indicators: Aching, Sore Pain Onset: 1 to 4 weeks ago Pain Frequency: Constant  Nutritional Risks: None Diabetes: No  How often do you need to have someone help you when you read instructions, pamphlets, or other written materials from your doctor or pharmacy?: 1 - Never    Interpreter Needed?: No  Information entered by :: Donna Marker LPN   Activities of Daily Living In your present state of health, do you have any difficulty performing the following activities: 06/30/2020  Hearing? N  Comment declines hearing aids  Vision? N  Difficulty concentrating or making decisions? Y  Walking or climbing stairs? Y  Dressing or bathing? Y  Doing errands, shopping? Y  Preparing Food and eating ? N  Using the Toilet? N  In the past six months, have you accidently leaked urine? N  Do you have problems with loss of bowel control? N  Managing your Medications? Y  Managing your Finances? Y  Housekeeping or managing your Housekeeping? Y  Some recent data might be hidden    Patient Care Team: Glean Hess, MD as PCP - General (Internal Medicine) Noreene Filbert, MD as Referring Physician (Radiation Oncology) Lequita Asal, MD as Referring Physician (Hematology and Oncology)  Indicate any recent Medical Services you may have received from other  than [facility] providers in the past year (date may be approximate).     Assessment:   This is a routine wellness examination for [place].  Hearing/Vision screen  Hearing Screening   125Hz  250Hz  500Hz  1000Hz  2000Hz  3000Hz  4000Hz  6000Hz  8000Hz   Right ear:           Left ear:           Comments: Pt denies hearing difficulty  Vision Screening Comments: Annual vision screenings at [facility]  Dietary issues and exercise activities discussed: Current Exercise Habits: The patient does not participate in regular exercise at present, Exercise limited by: orthopedic condition(s)  Goals    . Increase water intake     Recommend to increase fluid intake from 2 glasses of water per day to 4 glasses per day    . Prevent Falls     Patient carries a cane when leaving the house.       Depression Screen PHQ 2/9 Scores 06/30/2020 11/16/2019 06/04/2019 05/28/2019 05/02/2019 03/27/2018 09/15/2017  PHQ - 2 Score 0 0 0 0 0 0 0  PHQ- 9 Score - 0 - - - - -  Exception Documentation - - - - - - -    Fall Risk Fall Risk  06/30/2020 11/16/2019 06/04/2019 05/28/2019 05/02/2019  Falls in the past year? 1 0 0 0 0  Number falls in past yr: 1 0 0 0 0  Injury with Fall? 1 0 0 0 0  Risk Factor Category  - - - - -  Risk for fall due to : History of fall(s) History of fall(s);Impaired mobility Impaired balance/gait;Impaired mobility Impaired balance/gait;Impaired mobility;Impaired vision History of fall(s);Impaired balance/gait  Follow up Falls prevention discussed Falls evaluation completed;Falls prevention discussed - - Falls evaluation completed    Any stairs in or around the home? Yes  - pt also has ramp If so, are there any without handrails? No  Home free of loose throw rugs in walkways, pet beds, electrical cords, etc? Yes  Adequate lighting in your home to reduce risk of falls? Yes   ASSISTIVE DEVICES UTILIZED TO PREVENT FALLS:  Life alert? No  Use of a cane, walker or w/c? Yes  Grab bars in the bathroom? Yes   Shower chair or bench in shower? No  Elevated toilet seat or a handicapped toilet? No   TIMED UP AND GO:  Was the test performed? No . Telephonic visit.   Cognitive Function:  6CIT deferred for 2021 AWV due to patient in pain  MMSE - Mini Mental State Exam 08/03/2016  Orientation to time 5  Orientation to Place 5  Registration 3  Attention/ Calculation 5  Recall 2  Language- name 2 objects 2     6CIT Screen 05/28/2019  What Year? 0 points  What month? 3 points  What time? 0 points  Count back from 20 0 points  Months in reverse 0 points  Repeat phrase 8 points  Total Score 11    Immunizations Immunization History  Administered Date(s) Administered  . Pneumococcal Polysaccharide-23 11/30/2004  . Tdap 08/09/2016    TDAP status: Up to date   Flu Vaccine status: Declined, Education has been provided regarding the importance of this vaccine but patient still declined. Advised may receive this vaccine at local pharmacy or Health Dept. Aware to provide a copy of the vaccination record if obtained from local pharmacy or Health Dept. Verbalized acceptance and understanding.   Pneumococcal vaccine status: Declined,  Education has been provided regarding the importance of this vaccine but patient still declined. Advised may receive this vaccine at local pharmacy or Health Dept. Aware to provide a copy of the vaccination record if obtained from local pharmacy or Health Dept. Verbalized acceptance and understanding.    Covid-19 vaccine status: Declined, Education has been provided regarding the importance of this vaccine but patient still declined. Advised may receive this vaccine at local pharmacy or Health Dept.or vaccine clinic. Aware to provide a copy of the vaccination record if obtained from local pharmacy or Health Dept. Verbalized acceptance and understanding.  Qualifies for Shingles Vaccine? Yes   Zostavax completed No   Shingrix Completed?: No.    Education has been provided  regarding the importance of this vaccine. Patient has been advised to call insurance company to determine Donna of pocket expense if they have not yet received this vaccine. Advised may also receive vaccine at local pharmacy or Health Dept. Verbalized acceptance and understanding.  Screening Tests Health Maintenance  Topic Date Due  . COVID-19 Vaccine (1) Never done  . PNA vac Low Risk Adult (2 of 2 - PCV13) 11/30/2005  . MAMMOGRAM  07/24/2020  . TETANUS/TDAP  08/09/2026  . DEXA SCAN  Completed    Health Maintenance  Health Maintenance Due  Topic Date Due  . COVID-19 Vaccine (1) Never done  . PNA vac Low Risk Adult (2 of 2 - PCV13) 11/30/2005    Colorectal cancer screening: No longer required.    Mammogram status: Completed 07/25/19. Repeat every year   Bone Density status: Completed 07/25/19. Results reflect: Bone density results: OSTEOPENIA. Repeat every 2 years.  Lung Cancer Screening: (Low Dose CT Chest recommended if Age 37-80 years, 30 pack-year currently smoking OR have quit w/in 15years.) does not qualify.   Additional Screening:  Hepatitis C Screening: no longer required  Vision Screening: Recommended annual ophthalmology exams for early detection of glaucoma and other disorders of the eye. Is the patient up to date with their annual eye exam?  Yes  Who is the provider or what is the name of the office in which the patient attends annual eye exams? [facility]  Dental Screening: Recommended annual dental exams for proper oral hygiene  Community Resource Referral / Chronic Care Management: CRR required this visit?  No  CCM required this visit?  No      Plan:     I have personally reviewed and noted the following in the patient's chart:   . Medical and social history . Use of alcohol, tobacco or illicit drugs  . Current medications and supplements . Functional ability and status . Nutritional status . Physical activity . Advanced directives . List of other  physicians . Hospitalizations, surgeries, and ER visits in previous 12 months . Vitals . Screenings to include cognitive, depression, and falls . Referrals and appointments  In addition, I have reviewed and discussed with patient certain preventive protocols, quality metrics, and best practice recommendations. A written personalized care plan for preventive services as well as general preventive health recommendations were provided to patient.  Due to this being a telephonic visit, the after visit summary with patients personalized plan was offered to patient via mail or my-chart. Patient declined at this time.     Donna Marker, LPN   07/02/4170   Nurse Notes: pt accompanied during visit today by her son, Chip & daughter in law, Clarise Cruz. Pt has had recent falls and presented to [facility] ER on 06/24/20. Pt did not have any injuries but c/o still being in pain; taking 1000mg  tylenol TID, using heating pad and plans to try OTC voltaren or lidocaine patch. Pt's family to contact office if sxs do not improve.

## 2020-06-30 NOTE — Patient Instructions (Signed)
Donna Santos , Thank you for taking time to come for your Medicare Wellness Visit. I appreciate your ongoing commitment to your health goals. Please review the following plan we discussed and let me know if I can assist you in the future.   Screening recommendations/referrals: Colonoscopy: no longer required Mammogram: done 07/25/19 Bone Density: done 07/25/19   Recommended yearly ophthalmology/optometry visit for glaucoma screening and checkup Recommended yearly dental visit for hygiene and checkup  Vaccinations: Influenza vaccine: declined Pneumococcal vaccine: declined Tdap vaccine: done 08/09/16 Shingles vaccine: discussed   Covid-19:discussed  Conditions/risks identified: Recommend preventing future falls with home safety devices  Next appointment: Follow up in one year for your annual wellness visit    Preventive Care 16 Years and Older, Female Preventive care refers to lifestyle choices and visits with your health care provider that can promote health and wellness. What does preventive care include?  A yearly physical exam. This is also called an annual well check.  Dental exams once or twice a year.  Routine eye exams. Ask your health care provider how often you should have your eyes checked.  Personal lifestyle choices, including:  Daily care of your teeth and gums.  Regular physical activity.  Eating a healthy diet.  Avoiding tobacco and drug use.  Limiting alcohol use.  Practicing safe sex.  Taking low-dose aspirin every day.  Taking vitamin and mineral supplements as recommended by your health care provider. What happens during an annual well check? The services and screenings done by your health care provider during your annual well check will depend on your age, overall health, lifestyle risk factors, and family history of disease. Counseling  Your health care provider may ask you questions about your:  Alcohol use.  Tobacco use.  Drug  use.  Emotional well-being.  Home and relationship well-being.  Sexual activity.  Eating habits.  History of falls.  Memory and ability to understand (cognition).  Work and work Statistician.  Reproductive health. Screening  You may have the following tests or measurements:  Height, weight, and BMI.  Blood pressure.  Lipid and cholesterol levels. These may be checked every 5 years, or more frequently if you are over 61 years old.  Skin check.  Lung cancer screening. You may have this screening every year starting at age 60 if you have a 30-pack-year history of smoking and currently smoke or have quit within the past 15 years.  Fecal occult blood test (FOBT) of the stool. You may have this test every year starting at age 68.  Flexible sigmoidoscopy or colonoscopy. You may have a sigmoidoscopy every 5 years or a colonoscopy every 10 years starting at age 52.  Hepatitis C blood test.  Hepatitis B blood test.  Sexually transmitted disease (STD) testing.  Diabetes screening. This is done by checking your blood sugar (glucose) after you have not eaten for a while (fasting). You may have this done every 1-3 years.  Bone density scan. This is done to screen for osteoporosis. You may have this done starting at age 72.  Mammogram. This may be done every 1-2 years. Talk to your health care provider about how often you should have regular mammograms. Talk with your health care provider about your test results, treatment options, and if necessary, the need for more tests. Vaccines  Your health care provider may recommend certain vaccines, such as:  Influenza vaccine. This is recommended every year.  Tetanus, diphtheria, and acellular pertussis (Tdap, Td) vaccine. You may need a Td  booster every 10 years.  Zoster vaccine. You may need this after age 4.  Pneumococcal 13-valent conjugate (PCV13) vaccine. One dose is recommended after age 66.  Pneumococcal polysaccharide  (PPSV23) vaccine. One dose is recommended after age 17. Talk to your health care provider about which screenings and vaccines you need and how often you need them. This information is not intended to replace advice given to you by your health care provider. Make sure you discuss any questions you have with your health care provider. Document Released: 12/12/2015 Document Revised: 08/04/2016 Document Reviewed: 09/16/2015 Elsevier Interactive Patient Education  2017 [place] Prevention in the Home Falls can cause injuries. They can happen to people of all ages. There are many things you can do to make your home safe and to help prevent falls. What can I do on the outside of my home?  Regularly fix the edges of walkways and driveways and fix any cracks.  Remove anything that might make you trip as you walk through a door, such as a raised step or threshold.  Trim any bushes or trees on the path to your home.  Use bright outdoor lighting.  Clear any walking paths of anything that might make someone trip, such as rocks or tools.  Regularly check to see if handrails are loose or broken. Make sure that both sides of any steps have handrails.  Any raised decks and porches should have guardrails on the edges.  Have any leaves, snow, or ice cleared regularly.  Use sand or salt on walking paths during winter.  Clean up any spills in your garage right away. This includes oil or grease spills. What can I do in the bathroom?  Use night lights.  Install grab bars by the toilet and in the tub and shower. Do not use towel bars as grab bars.  Use non-skid mats or decals in the tub or shower.  If you need to sit down in the shower, use a plastic, non-slip stool.  Keep the floor dry. Clean up any water that spills on the floor as soon as it happens.  Remove soap buildup in the tub or shower regularly.  Attach bath mats securely with double-sided non-slip rug tape.  Do not have  throw rugs and other things on the floor that can make you trip. What can I do in the bedroom?  Use night lights.  Make sure that you have a light by your bed that is easy to reach.  Do not use any sheets or blankets that are too big for your bed. They should not hang down onto the floor.  Have a firm chair that has side arms. You can use this for support while you get dressed.  Do not have throw rugs and other things on the floor that can make you trip. What can I do in the kitchen?  Clean up any spills right away.  Avoid walking on wet floors.  Keep items that you use a lot in easy-to-reach places.  If you need to reach something above you, use a strong step stool that has a grab bar.  Keep electrical cords out of the way.  Do not use floor polish or wax that makes floors slippery. If you must use wax, use non-skid floor wax.  Do not have throw rugs and other things on the floor that can make you trip. What can I do with my stairs?  Do not leave any items on the stairs.  Make sure that there are handrails on both sides of the stairs and use them. Fix handrails that are broken or loose. Make sure that handrails are as long as the stairways.  Check any carpeting to make sure that it is firmly attached to the stairs. Fix any carpet that is loose or worn.  Avoid having throw rugs at the top or bottom of the stairs. If you do have throw rugs, attach them to the floor with carpet tape.  Make sure that you have a light switch at the top of the stairs and the bottom of the stairs. If you do not have them, ask someone to add them for you. What else can I do to help prevent falls?  Wear shoes that:  Do not have high heels.  Have rubber bottoms.  Are comfortable and fit you well.  Are closed at the toe. Do not wear sandals.  If you use a stepladder:  Make sure that it is fully opened. Do not climb a closed stepladder.  Make sure that both sides of the stepladder are  locked into place.  Ask someone to hold it for you, if possible.  Clearly mark and make sure that you can see:  Any grab bars or handrails.  First and last steps.  Where the edge of each step is.  Use tools that help you move around (mobility aids) if they are needed. These include:  Canes.  Walkers.  Scooters.  Crutches.  Turn on the lights when you go into a dark area. Replace any light bulbs as soon as they burn out.  Set up your furniture so you have a clear path. Avoid moving your furniture around.  If any of your floors are uneven, fix them.  If there are any pets around you, be aware of where they are.  Review your medicines with your doctor. Some medicines can make you feel dizzy. This can increase your chance of falling. Ask your doctor what other things that you can do to help prevent falls. This information is not intended to replace advice given to you by your health care provider. Make sure you discuss any questions you have with your health care provider. Document Released: 09/11/2009 Document Revised: 04/22/2016 Document Reviewed: 12/20/2014 Elsevier Interactive Patient Education  2017 [employer].

## 2020-07-03 ENCOUNTER — Other Ambulatory Visit: Payer: Self-pay | Admitting: Internal Medicine

## 2020-07-04 ENCOUNTER — Telehealth: Payer: Self-pay

## 2020-07-04 ENCOUNTER — Telehealth: Payer: Self-pay | Admitting: Internal Medicine

## 2020-07-04 NOTE — Telephone Encounter (Signed)
Copied from [place] [phone]. Topic: General - Other >> Jul 04, 2020  3:36 PM Gillis Ends D wrote: Reason for CRM: Patients son is requesting a call back he has several questions and he wants some information. Please advise.

## 2020-07-04 NOTE — Telephone Encounter (Signed)
Called son talked to him about his mother. Mother had fallen 2 weeks ago. Told son to see how is mother does over the weekend and give [place] a call Monday if his mother is still in pain. He said his mother seems to be doing better mother went to [facility] they told pt to take tylenol. Pt has been taking tylenol.   KP

## 2020-07-04 NOTE — Telephone Encounter (Signed)
Called pts son left VM for him to call back.  KP

## 2020-07-07 ENCOUNTER — Other Ambulatory Visit: Payer: Self-pay | Admitting: Internal Medicine

## 2020-07-09 ENCOUNTER — Encounter: Payer: Self-pay | Admitting: Internal Medicine

## 2020-07-09 ENCOUNTER — Ambulatory Visit
Admission: RE | Admit: 2020-07-09 | Discharge: 2020-07-09 | Disposition: A | Discharge status: Home / Self Care | Payer: Medicare Other | Source: Ambulatory Visit | Attending: Internal Medicine | Admitting: Internal Medicine

## 2020-07-09 ENCOUNTER — Ambulatory Visit
Admission: RE | Admit: 2020-07-09 | Discharge: 2020-07-09 | Disposition: A | Discharge status: Home / Self Care | Payer: Medicare Other | Attending: Internal Medicine | Admitting: Internal Medicine

## 2020-07-09 ENCOUNTER — Ambulatory Visit (INDEPENDENT_AMBULATORY_CARE_PROVIDER_SITE_OTHER): Payer: Medicare Other | Admitting: Internal Medicine

## 2020-07-09 ENCOUNTER — Other Ambulatory Visit: Payer: Self-pay

## 2020-07-09 VITALS — BP 122/70 | HR 60 | Temp 97.9°F | Ht 60.0 in | Wt 126.2 lb

## 2020-07-09 DIAGNOSIS — M4186 Other forms of scoliosis, lumbar region: Secondary | ICD-10-CM | POA: Diagnosis not present

## 2020-07-09 DIAGNOSIS — M545 Low back pain: Secondary | ICD-10-CM | POA: Insufficient documentation

## 2020-07-09 DIAGNOSIS — F0390 Unspecified dementia without behavioral disturbance: Secondary | ICD-10-CM | POA: Diagnosis not present

## 2020-07-09 DIAGNOSIS — G8929 Other chronic pain: Secondary | ICD-10-CM | POA: Insufficient documentation

## 2020-07-09 DIAGNOSIS — M4319 Spondylolisthesis, multiple sites in spine: Secondary | ICD-10-CM | POA: Diagnosis not present

## 2020-07-09 DIAGNOSIS — I700 Atherosclerosis of aorta: Secondary | ICD-10-CM | POA: Diagnosis not present

## 2020-07-09 DIAGNOSIS — R410 Disorientation, unspecified: Secondary | ICD-10-CM

## 2020-07-09 DIAGNOSIS — M25551 Pain in right hip: Secondary | ICD-10-CM | POA: Diagnosis not present

## 2020-07-09 DIAGNOSIS — I779 Disorder of arteries and arterioles, unspecified: Secondary | ICD-10-CM

## 2020-07-09 DIAGNOSIS — S3992XA Unspecified injury of lower back, initial encounter: Secondary | ICD-10-CM | POA: Diagnosis not present

## 2020-07-09 DIAGNOSIS — I4819 Other persistent atrial fibrillation: Secondary | ICD-10-CM

## 2020-07-09 LAB — POCT URINALYSIS DIPSTICK
Bilirubin, UA: NEGATIVE
Blood, UA: NEGATIVE
Glucose, UA: NEGATIVE
Ketones, UA: NEGATIVE
Leukocytes, UA: NEGATIVE
Nitrite, UA: NEGATIVE
Protein, UA: NEGATIVE
Spec Grav, UA: <=1.005 — AB (ref 1.010–1.025)
Urobilinogen, UA: 0.2 E.U./dL
pH, UA: 7 (ref 5.0–8.0)

## 2020-07-09 MED ORDER — MEMANTINE HCL 5 MG PO TABS: 5.0000 mg | ORAL_TABLET | Freq: Two times a day (BID) | ORAL | 5 refills | Status: DC

## 2020-07-09 NOTE — Patient Instructions (Signed)
May take Tylenol 500 mg 2 tabs twice a day  Start Namenda 5 mg twice a day

## 2020-07-09 NOTE — Progress Notes (Signed)
Date:  07/09/2020   Name:  Donna Santos   DOB:  [birth year]   MRN:  [id number]   Chief Complaint: Fall (follow up ) and Memory Loss (6CIT=24)   Hip Pain  The incident occurred more than 1 week ago. The incident occurred at home. The injury mechanism was a fall. The pain is present in the right hip. She has tried acetaminophen for the symptoms. The treatment provided moderate relief.  Since that time family feels that her memory is worsening and she is having more trouble with ADLs, ambulation, etc.  She was living alone up until her fall on 06/24/20.  XR Right hip: 06/25/20 at [facility]  FINDINGS:  Diffuse osteopenia. No acute fracture. The bilateral hips, SI joints, and pubic symphysis are approximated.   Scattered vascular calcifications and multiple pelvic phleboliths. Otherwise, visualized abdomen and soft tissues are unremarkable.  CT Head:  06/25/2020 at [facility] FINDINGS:   Moderate parenchymal volume loss with ex vacuo dilatation. There are severe scattered andconfluent hypodense foci within the periventricular and deep white matter likely representing small vessel ischemic changes. Right cerebellar chronic infarct.   There is no midline shift. No mass lesion. There is no evidence of acute infarct. No acute intracranial hemorrhage. No fractures are evident. The sinuses are pneumatized.  Memory loss - seen in the past by Neurology,  Felt to be senile dementia.  At that time was living alone.  Not driving but has close family support.  Neurology had suggested she start Namenda but this was declined.  Per family, her memory issues are progressive since she has been in their home.  Atrial fibrillation - rate is controlled; she is taking Eliquis.  Had some significant bruises from her falls but they are resolving.  On beta blocker daily.  Lab Results  Component Value Date   CREATININE 0.93 06/16/2020   BUN 19 06/16/2020   NA 128 (L) 06/16/2020   K 3.5 06/16/2020   CL 91 (L)  06/16/2020   CO2 26 06/16/2020   Lab Results  Component Value Date   CHOL 261 (A) 07/24/2013   HDL 44 07/24/2013   LDLCALC 179 07/24/2013   TRIG 192 (A) 07/24/2013   Lab Results  Component Value Date   TSH 3.300 05/02/2019   No results found for: HGBA1C Lab Results  Component Value Date   WBC 7.1 06/16/2020   HGB 14.0 06/16/2020   HCT 41.1 06/16/2020   MCV 90.5 06/16/2020   PLT 375 06/16/2020   Lab Results  Component Value Date   ALT 18 06/16/2020   AST 19 06/16/2020   ALKPHOS 39 06/16/2020   BILITOT 0.9 06/16/2020     Review of Systems  Constitutional: Negative for chills, fatigue, fever and unexpected weight change.  Respiratory: Negative for chest tightness and shortness of breath.   Cardiovascular: Negative for chest pain.  Genitourinary: Negative for dysuria, frequency and urgency.  Musculoskeletal: Positive for arthralgias, back pain and gait problem.  Neurological: Positive for weakness. Negative for dizziness, speech difficulty and headaches.  Hematological: Bruises/bleeds easily.  Psychiatric/Behavioral: Positive for confusion. Negative for agitation and sleep disturbance. The patient is not nervous/anxious.     Patient Active Problem List   Diagnosis Date Noted  . Hyponatremia 06/16/2020  . Hypokalemia 12/17/2019  . Chest wall recurrence of right breast cancer ([place]) 11/16/2019  . Osteoporosis 06/17/2019  . Primary open angle glaucoma (POAG) of left eye, moderate stage 04/25/2018  . Persistent atrial fibrillation ([place]) 12/28/2016  .  Carcinoma of overlapping sites of right breast in female, estrogen receptor positive ([place]) 12/21/2016  . Arthritis of right knee 08/03/2016  . Senile dementia, uncomplicated ([place]) 54/98/2641  . Low back pain 04/12/2016  . Impaired renal function 05/09/2015  . Arteriosclerosis of coronary artery 05/09/2015  . Essential (primary) hypertension 05/09/2015  . Personal history of malignant neoplasm of breast 05/09/2015  .  Muscle spasms of neck 05/09/2015  . Arthritis of shoulder region, degenerative 05/09/2015  . Hypercholesteremia 04/20/2012  . Carotid artery disease ([place]) 05/28/2005    Allergies  Allergen Reactions  . Ace Inhibitors     Other reaction(s): Angioedema  . Brinzolamide-Brimonidine     Past Surgical History:  Procedure Laterality Date  . CAROTID ENDARTERECTOMY    . CORONARY ARTERY BYPASS GRAFT  2006  . MASS EXCISION Right 08/28/2015   Procedure: EXCISION MASS; remove massive right chest wall;  Surgeon: Florene Glen, MD;  Location: [facility] ORS;  Service: General;  Laterality: Right;  . MASTECTOMY, RADICAL Right 2002   BREAST CA  . TOTAL ABDOMINAL HYSTERECTOMY      Social History   Tobacco Use  . Smoking status: Former Smoker    Packs/day: 1.50    Years: 1.50    Pack years: 2.25    Types: Cigarettes    Quit date: 10/04/1952    Years since quitting: 67.8  . Smokeless tobacco: Never Used  Vaping Use  . Vaping Use: Never used  Substance Use Topics  . Alcohol use: No    Alcohol/week: 0.0 standard drinks  . Drug use: No     Medication list has been reviewed and updated.  Current Meds  Medication Sig  . amLODipine (NORVASC) 5 MG tablet TAKE 1 TABLET BY MOUTH TWICE DAILY  . anastrozole (ARIMIDEX) 1 MG tablet TAKE 1 TABLET(1 MG) BY MOUTH DAILY  . aspirin EC 81 MG tablet Take 81 mg by mouth daily.  . Calcium Carb-Cholecalciferol (CALCIUM 600+D) 600-800 MG-UNIT TABS Take 1 tablet by mouth daily. Reported on 12/25/2015  . chlorthalidone (HYGROTON) 25 MG tablet TAKE 1 TABLET BY MOUTH DAILY  . Cholecalciferol (VITAMIN D-3) 125 MCG (5000 UT) TABS Take 1 tablet by mouth daily.  Marland Kitchen ELIQUIS 5 MG TABS tablet Take 5 mg by mouth 2 (two) times daily.   Marland Kitchen ibuprofen (ADVIL,MOTRIN) 200 MG tablet Take 200 mg by mouth every 6 (six) hours as needed.   . isosorbide mononitrate (IMDUR) 30 MG 24 hr tablet TAKE 1 TABLET(30 MG) BY MOUTH DAILY  . latanoprost (XALATAN) 0.005 % ophthalmic solution INT 1  GTT INTO EACH EYE QHS.  Marland Kitchen losartan (COZAAR) 25 MG tablet TAKE 1 TABLET(25 MG) BY MOUTH DAILY  . metoprolol succinate (TOPROL-XL) 50 MG 24 hr tablet TAKE 1 TABLET BY MOUTH EVERY NIGHT  . nitroGLYCERIN (NITROSTAT) 0.4 MG SL tablet Place 0.4 mg under the tongue every 5 (five) minutes as needed for chest pain.   Marland Kitchen timolol (TIMOPTIC) 0.25 % ophthalmic solution INSTILL ONE DROP INTO BOTH EYES EVERY MORNING.  . TURMERIC PO Take 1 tablet by mouth daily.    PHQ 2/9 Scores 06/30/2020 11/16/2019 06/04/2019 05/28/2019  PHQ - 2 Score 0 0 0 0  PHQ- 9 Score - 0 - -  Exception Documentation - - - -   6CIT Screen 07/09/2020 05/28/2019  What Year? 4 points 0 points  What month? 3 points 3 points  What time? 3 points 0 points  Count back from 20 0 points 0 points  Months in reverse 4  points 0 points  Repeat phrase 10 points 8 points  Total Score 24 11    No flowsheet data found.  BP Readings from Last 3 Encounters:  07/09/20 122/70  06/16/20 (!) 152/75  11/16/19 132/72    Physical Exam Vitals and nursing note reviewed.  Constitutional:      General: She is not in acute distress.    Appearance: Normal appearance. She is well-developed.  HENT:     Head: Normocephalic and atraumatic.  Cardiovascular:     Rate and Rhythm: Normal rate. Rhythm irregular.     Pulses: Normal pulses.  Pulmonary:     Effort: Pulmonary effort is normal. No respiratory distress.     Breath sounds: No wheezing or rhonchi.  Abdominal:     Palpations: Abdomen is soft.     Tenderness: There is no abdominal tenderness.  Musculoskeletal:       Arms:     Lumbar back: Bony tenderness present. Normal range of motion. Negative right straight leg raise test and negative left straight leg raise test.     Right hip: Decreased strength.     Left hip: Bony tenderness present. Decreased strength.     Right lower leg: No edema.     Left lower leg: No edema.     Comments: Able to lift both legs and move hips with minimal pain   Skin:    General: Skin is warm and dry.     Capillary Refill: Capillary refill takes less than 2 seconds.     Findings: No rash.  Neurological:     Mental Status: She is alert. She is confused.     Gait: Gait abnormal (uses w/c or rolator).  Psychiatric:        Attention and Perception: Attention and perception normal.        Mood and Affect: Mood normal.        Speech: Speech normal.        Behavior: Behavior normal.        Cognition and Memory: Memory is impaired.     Wt Readings from Last 3 Encounters:  07/09/20 126 lb 3.2 oz (57.2 kg)  06/16/20 133 lb 8 oz (60.6 kg)  11/16/19 139 lb (63 kg)    BP 122/70   Pulse 60   Temp 97.9 F (36.6 C) (Oral)   Ht 5' (1.524 m)   Wt 126 lb 3.2 oz (57.2 kg)   SpO2 97%   BMI 24.65 kg/m   Assessment and Plan: 1. Senile dementia, uncomplicated ([place]) This has been gradually progressive and significantly worse from the family perspective since her falls Recommend that she remain with family and agrees to beginning Namenda - memantine (NAMENDA) 5 MG tablet; Take 1 tablet (5 mg total) by mouth 2 (two) times daily.  Dispense: 60 tablet; Refill: 5  2. Chronic right-sided low back pain without sciatica No lumbar xrays were done at the ED Concern for compression fracture given midline pain and lack of improvement Continue tylenol and heat - DG Lumbar Spine Complete; Future  3. Hip pain, acute, right No fracture on xrays Continue tylenol as needed  4. Confusion UA negative - POCT Urinalysis Dipstick  5. Carotid artery disease, unspecified laterality (HCC) Hx of bilateral stenosis; on Eliquis bid  6. Persistent atrial fibrillation ([place]) Rate controlled on metoprolol with normal BP Tolerating Eliquis with some expected but not abnormal bruising from her fall Per cardiology visit 02/2020 she is to continue aspirin and Eliquis   Partially  dictated using Editor, commissioning. Any errors are unintentional.  Halina Maidens, MD [facility]  07/09/2020

## 2020-07-10 ENCOUNTER — Encounter: Payer: Self-pay | Admitting: Internal Medicine

## 2020-07-10 DIAGNOSIS — I700 Atherosclerosis of aorta: Secondary | ICD-10-CM | POA: Insufficient documentation

## 2020-07-15 ENCOUNTER — Other Ambulatory Visit: Payer: Self-pay | Admitting: Internal Medicine

## 2020-07-15 DIAGNOSIS — I2510 Atherosclerotic heart disease of native coronary artery without angina pectoris: Secondary | ICD-10-CM

## 2020-07-15 NOTE — Telephone Encounter (Signed)
Requested Prescriptions  Pending Prescriptions Disp Refills  . isosorbide mononitrate (IMDUR) 30 MG 24 hr tablet [Pharmacy Med Name: ISOSORBIDE MONONITRATE 30MG  ER TABS] 90 tablet 3    Sig: TAKE 1 TABLET(30 MG) BY MOUTH DAILY     Cardiovascular:  Nitrates Passed - 07/15/2020  3:43 AM      Passed - Last BP in normal range    BP Readings from Last 1 Encounters:  07/09/20 122/70         Passed - Last Heart Rate in normal range    Pulse Readings from Last 1 Encounters:  07/09/20 60         Passed - Valid encounter within last 12 months    Recent Outpatient Visits          6 days ago Senile dementia, uncomplicated [facility])   [facility] Glean Hess, MD   8 months ago Essential (primary) hypertension   [facility] Glean Hess, MD   1 year ago Acute pain of right knee   [facility] Glean Hess, MD   1 year ago Annual physical exam   [facility] Glean Hess, MD   1 year ago Left-sided low back pain without sciatica, unspecified chronicity   [facility] Glean Hess, MD      Future Appointments            In 1 month [place] Melia, Jesse Sans, MD [facility], [facility]

## 2020-07-16 ENCOUNTER — Telehealth: Payer: Self-pay | Admitting: Licensed Clinical Social Worker

## 2020-07-16 DIAGNOSIS — I10 Essential (primary) hypertension: Secondary | ICD-10-CM

## 2020-07-16 NOTE — Telephone Encounter (Signed)
  Chronic Care Management    Clinical Social Work General Follow Up Note  07/16/2020 Name: Donna Santos MRN: [id number] DOB: Aug 10, 1925  Donna Santos is a 86 y.o. year old female who is a primary care patient of Donna Melia Jesse Sans, MD. The CCM team was consulted for assistance with [employer] .   Review of patient status, including review of consultants reports, relevant laboratory and other test results, and collaboration with appropriate care team members and the patient's provider was performed as part of comprehensive patient evaluation and provision of chronic care management services.    LCSW received CCM referral for community resource need. LCSW forwarded referral to Care Guide who will contact family and provide available respite care resource education within [facility]. Care Guide will refer to CCM team if needed.   Outpatient Encounter Medications as of 07/16/2020  Medication Sig  . amLODipine (NORVASC) 5 MG tablet TAKE 1 TABLET BY MOUTH TWICE DAILY  . anastrozole (ARIMIDEX) 1 MG tablet TAKE 1 TABLET(1 MG) BY MOUTH DAILY  . aspirin EC 81 MG tablet Take 81 mg by mouth daily.  . Calcium Carb-Cholecalciferol (CALCIUM 600+D) 600-800 MG-UNIT TABS Take 1 tablet by mouth daily. Reported on 12/25/2015  . chlorthalidone (HYGROTON) 25 MG tablet TAKE 1 TABLET BY MOUTH DAILY  . Cholecalciferol (VITAMIN D-3) 125 MCG (5000 UT) TABS Take 1 tablet by mouth daily.  Marland Kitchen ELIQUIS 5 MG TABS tablet Take 5 mg by mouth 2 (two) times daily.   Marland Kitchen ibuprofen (ADVIL,MOTRIN) 200 MG tablet Take 200 mg by mouth every 6 (six) hours as needed.   . isosorbide mononitrate (IMDUR) 30 MG 24 hr tablet TAKE 1 TABLET(30 MG) BY MOUTH DAILY  . latanoprost (XALATAN) 0.005 % ophthalmic solution INT 1 GTT INTO EACH EYE QHS.  Marland Kitchen losartan (COZAAR) 25 MG tablet TAKE 1 TABLET(25 MG) BY MOUTH DAILY  . memantine (NAMENDA) 5 MG tablet Take 1 tablet (5 mg total) by mouth 2 (two) times daily.  . metoprolol succinate  (TOPROL-XL) 50 MG 24 hr tablet TAKE 1 TABLET BY MOUTH EVERY NIGHT  . nitroGLYCERIN (NITROSTAT) 0.4 MG SL tablet Place 0.4 mg under the tongue every 5 (five) minutes as needed for chest pain.   Marland Kitchen timolol (TIMOPTIC) 0.25 % ophthalmic solution INSTILL ONE DROP INTO BOTH EYES EVERY MORNING.  . TURMERIC PO Take 1 tablet by mouth daily.   No facility-administered encounter medications on file as of 07/16/2020.   Follow Up Plan: [place] will reach out to patient for education on respite care resources within [facility].   Eula Fried, BSW, MSW, [email or web address] Phone: [phone]

## 2020-08-02 DIAGNOSIS — Z7901 Long term (current) use of anticoagulants: Secondary | ICD-10-CM | POA: Diagnosis not present

## 2020-08-02 DIAGNOSIS — E876 Hypokalemia: Secondary | ICD-10-CM | POA: Diagnosis not present

## 2020-08-02 DIAGNOSIS — I2510 Atherosclerotic heart disease of native coronary artery without angina pectoris: Secondary | ICD-10-CM | POA: Diagnosis not present

## 2020-08-02 DIAGNOSIS — I4891 Unspecified atrial fibrillation: Secondary | ICD-10-CM | POA: Diagnosis not present

## 2020-08-02 DIAGNOSIS — H401122 Primary open-angle glaucoma, left eye, moderate stage: Secondary | ICD-10-CM | POA: Diagnosis not present

## 2020-08-02 DIAGNOSIS — Z951 Presence of aortocoronary bypass graft: Secondary | ICD-10-CM | POA: Diagnosis not present

## 2020-08-02 DIAGNOSIS — S0990XA Unspecified injury of head, initial encounter: Secondary | ICD-10-CM | POA: Diagnosis not present

## 2020-08-02 DIAGNOSIS — G9389 Other specified disorders of brain: Secondary | ICD-10-CM | POA: Diagnosis not present

## 2020-08-02 DIAGNOSIS — Z79811 Long term (current) use of aromatase inhibitors: Secondary | ICD-10-CM | POA: Diagnosis not present

## 2020-08-02 DIAGNOSIS — M25561 Pain in right knee: Secondary | ICD-10-CM | POA: Diagnosis not present

## 2020-08-02 DIAGNOSIS — M25461 Effusion, right knee: Secondary | ICD-10-CM | POA: Diagnosis not present

## 2020-08-02 DIAGNOSIS — S82141D Displaced bicondylar fracture of right tibia, subsequent encounter for closed fracture with routine healing: Secondary | ICD-10-CM | POA: Diagnosis not present

## 2020-08-02 DIAGNOSIS — I10 Essential (primary) hypertension: Secondary | ICD-10-CM | POA: Diagnosis not present

## 2020-08-02 DIAGNOSIS — R350 Frequency of micturition: Secondary | ICD-10-CM | POA: Diagnosis not present

## 2020-08-02 DIAGNOSIS — Z7982 Long term (current) use of aspirin: Secondary | ICD-10-CM | POA: Diagnosis not present

## 2020-08-02 DIAGNOSIS — Z853 Personal history of malignant neoplasm of breast: Secondary | ICD-10-CM | POA: Diagnosis not present

## 2020-08-02 DIAGNOSIS — D72829 Elevated white blood cell count, unspecified: Secondary | ICD-10-CM | POA: Diagnosis not present

## 2020-08-02 DIAGNOSIS — E7800 Pure hypercholesterolemia, unspecified: Secondary | ICD-10-CM | POA: Diagnosis not present

## 2020-08-02 DIAGNOSIS — Z87891 Personal history of nicotine dependence: Secondary | ICD-10-CM | POA: Diagnosis not present

## 2020-08-02 DIAGNOSIS — M1711 Unilateral primary osteoarthritis, right knee: Secondary | ICD-10-CM | POA: Diagnosis not present

## 2020-08-02 DIAGNOSIS — M25061 Hemarthrosis, right knee: Secondary | ICD-10-CM | POA: Diagnosis not present

## 2020-08-02 DIAGNOSIS — R3915 Urgency of urination: Secondary | ICD-10-CM | POA: Diagnosis not present

## 2020-08-02 DIAGNOSIS — E785 Hyperlipidemia, unspecified: Secondary | ICD-10-CM | POA: Diagnosis not present

## 2020-08-02 DIAGNOSIS — M11261 Other chondrocalcinosis, right knee: Secondary | ICD-10-CM | POA: Diagnosis not present

## 2020-08-03 DIAGNOSIS — M25561 Pain in right knee: Secondary | ICD-10-CM | POA: Diagnosis not present

## 2020-08-03 DIAGNOSIS — I10 Essential (primary) hypertension: Secondary | ICD-10-CM | POA: Diagnosis not present

## 2020-08-03 DIAGNOSIS — E876 Hypokalemia: Secondary | ICD-10-CM | POA: Diagnosis not present

## 2020-08-04 DIAGNOSIS — E876 Hypokalemia: Secondary | ICD-10-CM | POA: Diagnosis not present

## 2020-08-04 DIAGNOSIS — M11261 Other chondrocalcinosis, right knee: Secondary | ICD-10-CM | POA: Insufficient documentation

## 2020-08-04 DIAGNOSIS — M25561 Pain in right knee: Secondary | ICD-10-CM | POA: Diagnosis not present

## 2020-08-05 DIAGNOSIS — I10 Essential (primary) hypertension: Secondary | ICD-10-CM | POA: Diagnosis not present

## 2020-08-05 DIAGNOSIS — M25561 Pain in right knee: Secondary | ICD-10-CM | POA: Diagnosis not present

## 2020-08-05 DIAGNOSIS — E876 Hypokalemia: Secondary | ICD-10-CM | POA: Diagnosis not present

## 2020-08-06 ENCOUNTER — Telehealth: Payer: Self-pay | Admitting: Internal Medicine

## 2020-08-06 ENCOUNTER — Encounter: Payer: Self-pay | Admitting: Internal Medicine

## 2020-08-06 ENCOUNTER — Other Ambulatory Visit: Payer: Self-pay

## 2020-08-06 MED ORDER — APIXABAN 2.5 MG PO TABS: 2.5000 mg | ORAL_TABLET | Freq: Two times a day (BID) | ORAL | 0 refills | Status: DC

## 2020-08-06 MED ORDER — COLCHICINE 0.6 MG PO TABS: 0.6000 mg | ORAL_TABLET | Freq: Every day | ORAL | Status: DC

## 2020-08-06 NOTE — Telephone Encounter (Signed)
Called the call back number. Could not leave VM. Did not go to VM.  KP

## 2020-08-06 NOTE — Telephone Encounter (Signed)
Copied from [place] [id number] [phone]. Topic: General - Inquiry >> Aug 06, 2020 10:37 AM Gillis Ends D wrote: Reason for CRM: Patients son called and wanted to update the Doctor on some medication changes his mother received while in the hospital. He would like for the nurse to return his call at [phone]. Please advise the patient.

## 2020-08-06 NOTE — Telephone Encounter (Unsigned)
Copied from [place] [id number]. Topic: General - Inquiry >> Aug 06, 2020 11:29 AM Gillis Ends D wrote: Reason for CRM: Patients daughter returned the call and states shes sorry she missed the call can you please return the call again. Please advise the patient

## 2020-08-06 NOTE — Telephone Encounter (Signed)
Called pt spoke to her daughter. Made 3 changes to her medication list. Add Colchicine 0.6 mg once a day, remove chlor. And take eliquis 2.5 mg x2 times a day.  KP

## 2020-08-12 DIAGNOSIS — I4811 Longstanding persistent atrial fibrillation: Secondary | ICD-10-CM | POA: Diagnosis not present

## 2020-08-12 DIAGNOSIS — E7800 Pure hypercholesterolemia, unspecified: Secondary | ICD-10-CM | POA: Diagnosis not present

## 2020-08-12 DIAGNOSIS — M25561 Pain in right knee: Secondary | ICD-10-CM | POA: Diagnosis not present

## 2020-08-12 DIAGNOSIS — M11261 Other chondrocalcinosis, right knee: Secondary | ICD-10-CM | POA: Diagnosis not present

## 2020-08-12 DIAGNOSIS — Z9181 History of falling: Secondary | ICD-10-CM | POA: Diagnosis not present

## 2020-08-12 DIAGNOSIS — E876 Hypokalemia: Secondary | ICD-10-CM | POA: Diagnosis not present

## 2020-08-12 DIAGNOSIS — I2510 Atherosclerotic heart disease of native coronary artery without angina pectoris: Secondary | ICD-10-CM | POA: Diagnosis not present

## 2020-08-12 DIAGNOSIS — Z853 Personal history of malignant neoplasm of breast: Secondary | ICD-10-CM | POA: Diagnosis not present

## 2020-08-12 DIAGNOSIS — H401122 Primary open-angle glaucoma, left eye, moderate stage: Secondary | ICD-10-CM | POA: Diagnosis not present

## 2020-08-12 DIAGNOSIS — Z951 Presence of aortocoronary bypass graft: Secondary | ICD-10-CM | POA: Diagnosis not present

## 2020-08-12 DIAGNOSIS — I10 Essential (primary) hypertension: Secondary | ICD-10-CM | POA: Diagnosis not present

## 2020-08-14 DIAGNOSIS — M11261 Other chondrocalcinosis, right knee: Secondary | ICD-10-CM | POA: Diagnosis not present

## 2020-08-14 DIAGNOSIS — M25561 Pain in right knee: Secondary | ICD-10-CM | POA: Diagnosis not present

## 2020-08-14 DIAGNOSIS — E7800 Pure hypercholesterolemia, unspecified: Secondary | ICD-10-CM | POA: Diagnosis not present

## 2020-08-14 DIAGNOSIS — H401122 Primary open-angle glaucoma, left eye, moderate stage: Secondary | ICD-10-CM | POA: Diagnosis not present

## 2020-08-14 DIAGNOSIS — Z9181 History of falling: Secondary | ICD-10-CM | POA: Diagnosis not present

## 2020-08-14 DIAGNOSIS — I4811 Longstanding persistent atrial fibrillation: Secondary | ICD-10-CM | POA: Diagnosis not present

## 2020-08-14 DIAGNOSIS — I2510 Atherosclerotic heart disease of native coronary artery without angina pectoris: Secondary | ICD-10-CM | POA: Diagnosis not present

## 2020-08-14 DIAGNOSIS — I10 Essential (primary) hypertension: Secondary | ICD-10-CM | POA: Diagnosis not present

## 2020-08-14 DIAGNOSIS — E876 Hypokalemia: Secondary | ICD-10-CM | POA: Diagnosis not present

## 2020-08-14 DIAGNOSIS — Z853 Personal history of malignant neoplasm of breast: Secondary | ICD-10-CM | POA: Diagnosis not present

## 2020-08-14 DIAGNOSIS — Z951 Presence of aortocoronary bypass graft: Secondary | ICD-10-CM | POA: Diagnosis not present

## 2020-08-15 ENCOUNTER — Telehealth: Payer: Self-pay | Admitting: Internal Medicine

## 2020-08-15 NOTE — Telephone Encounter (Signed)
Donna Santos, daughter in law is calling to schedule a hospital Follow up appt for the patient. Patient was discharged on 08/05/20 for [facility]. First available Hospital Follow up 09/05/20. Is there a sooner appt that the patient could be seen. Please advise [phone]

## 2020-08-18 ENCOUNTER — Ambulatory Visit: Payer: Medicare Other | Admitting: Internal Medicine

## 2020-08-18 ENCOUNTER — Ambulatory Visit
Admission: RE | Admit: 2020-08-18 | Discharge: 2020-08-18 | Disposition: A | Discharge status: Home / Self Care | Payer: Medicare Other | Source: Ambulatory Visit | Attending: Hematology and Oncology | Admitting: Hematology and Oncology

## 2020-08-18 ENCOUNTER — Other Ambulatory Visit: Payer: Self-pay

## 2020-08-18 DIAGNOSIS — C7989 Secondary malignant neoplasm of other specified sites: Secondary | ICD-10-CM | POA: Diagnosis not present

## 2020-08-18 DIAGNOSIS — Z1231 Encounter for screening mammogram for malignant neoplasm of breast: Secondary | ICD-10-CM | POA: Diagnosis not present

## 2020-08-18 DIAGNOSIS — C50911 Malignant neoplasm of unspecified site of right female breast: Secondary | ICD-10-CM

## 2020-08-18 DIAGNOSIS — Z9011 Acquired absence of right breast and nipple: Secondary | ICD-10-CM | POA: Insufficient documentation

## 2020-08-18 DIAGNOSIS — Z853 Personal history of malignant neoplasm of breast: Secondary | ICD-10-CM | POA: Diagnosis not present

## 2020-08-18 NOTE — Telephone Encounter (Signed)
This appt is fine. We will see the patient at appt.   CM

## 2020-08-18 NOTE — Telephone Encounter (Signed)
Donna Santos, pt's daughter in law called back in to make a change to pt's current apt, she states that pt has to have some important dental services completed today and is unable to make apt today with PCP. Rescheduled apt for next available, 9/28, please call back to assist if apt is to far out, unable to reschedule for sooner date.    Please assist/advise.

## 2020-08-19 DIAGNOSIS — E7800 Pure hypercholesterolemia, unspecified: Secondary | ICD-10-CM | POA: Diagnosis not present

## 2020-08-19 DIAGNOSIS — Z951 Presence of aortocoronary bypass graft: Secondary | ICD-10-CM | POA: Diagnosis not present

## 2020-08-19 DIAGNOSIS — Z9181 History of falling: Secondary | ICD-10-CM | POA: Diagnosis not present

## 2020-08-19 DIAGNOSIS — I4811 Longstanding persistent atrial fibrillation: Secondary | ICD-10-CM | POA: Diagnosis not present

## 2020-08-19 DIAGNOSIS — I2510 Atherosclerotic heart disease of native coronary artery without angina pectoris: Secondary | ICD-10-CM | POA: Diagnosis not present

## 2020-08-19 DIAGNOSIS — Z853 Personal history of malignant neoplasm of breast: Secondary | ICD-10-CM | POA: Diagnosis not present

## 2020-08-19 DIAGNOSIS — M25561 Pain in right knee: Secondary | ICD-10-CM | POA: Diagnosis not present

## 2020-08-19 DIAGNOSIS — M11261 Other chondrocalcinosis, right knee: Secondary | ICD-10-CM | POA: Diagnosis not present

## 2020-08-19 DIAGNOSIS — H401122 Primary open-angle glaucoma, left eye, moderate stage: Secondary | ICD-10-CM | POA: Diagnosis not present

## 2020-08-19 DIAGNOSIS — I10 Essential (primary) hypertension: Secondary | ICD-10-CM | POA: Diagnosis not present

## 2020-08-19 DIAGNOSIS — E876 Hypokalemia: Secondary | ICD-10-CM | POA: Diagnosis not present

## 2020-08-21 DIAGNOSIS — M11261 Other chondrocalcinosis, right knee: Secondary | ICD-10-CM | POA: Diagnosis not present

## 2020-08-21 DIAGNOSIS — Z9181 History of falling: Secondary | ICD-10-CM | POA: Diagnosis not present

## 2020-08-21 DIAGNOSIS — Z853 Personal history of malignant neoplasm of breast: Secondary | ICD-10-CM | POA: Diagnosis not present

## 2020-08-21 DIAGNOSIS — I10 Essential (primary) hypertension: Secondary | ICD-10-CM | POA: Diagnosis not present

## 2020-08-21 DIAGNOSIS — E7800 Pure hypercholesterolemia, unspecified: Secondary | ICD-10-CM | POA: Diagnosis not present

## 2020-08-21 DIAGNOSIS — Z951 Presence of aortocoronary bypass graft: Secondary | ICD-10-CM | POA: Diagnosis not present

## 2020-08-21 DIAGNOSIS — H401122 Primary open-angle glaucoma, left eye, moderate stage: Secondary | ICD-10-CM | POA: Diagnosis not present

## 2020-08-21 DIAGNOSIS — E876 Hypokalemia: Secondary | ICD-10-CM | POA: Diagnosis not present

## 2020-08-21 DIAGNOSIS — M25561 Pain in right knee: Secondary | ICD-10-CM | POA: Diagnosis not present

## 2020-08-21 DIAGNOSIS — I2510 Atherosclerotic heart disease of native coronary artery without angina pectoris: Secondary | ICD-10-CM | POA: Diagnosis not present

## 2020-08-21 DIAGNOSIS — I4811 Longstanding persistent atrial fibrillation: Secondary | ICD-10-CM | POA: Diagnosis not present

## 2020-08-26 ENCOUNTER — Other Ambulatory Visit
Admission: RE | Admit: 2020-08-26 | Discharge: 2020-08-26 | Disposition: A | Discharge status: Home / Self Care | Payer: Medicare Other | Attending: Internal Medicine | Admitting: Internal Medicine

## 2020-08-26 ENCOUNTER — Other Ambulatory Visit: Payer: Self-pay

## 2020-08-26 ENCOUNTER — Encounter: Payer: Self-pay | Admitting: Internal Medicine

## 2020-08-26 ENCOUNTER — Ambulatory Visit (INDEPENDENT_AMBULATORY_CARE_PROVIDER_SITE_OTHER): Payer: Medicare Other | Admitting: Internal Medicine

## 2020-08-26 VITALS — BP 130/74 | HR 70 | Temp 98.3°F | Ht 60.0 in | Wt 124.0 lb

## 2020-08-26 DIAGNOSIS — M11261 Other chondrocalcinosis, right knee: Secondary | ICD-10-CM

## 2020-08-26 DIAGNOSIS — Z79899 Other long term (current) drug therapy: Secondary | ICD-10-CM | POA: Insufficient documentation

## 2020-08-26 DIAGNOSIS — E876 Hypokalemia: Secondary | ICD-10-CM | POA: Diagnosis not present

## 2020-08-26 DIAGNOSIS — I10 Essential (primary) hypertension: Secondary | ICD-10-CM | POA: Diagnosis not present

## 2020-08-26 DIAGNOSIS — F0390 Unspecified dementia without behavioral disturbance: Secondary | ICD-10-CM

## 2020-08-26 DIAGNOSIS — Z951 Presence of aortocoronary bypass graft: Secondary | ICD-10-CM | POA: Diagnosis not present

## 2020-08-26 DIAGNOSIS — E7800 Pure hypercholesterolemia, unspecified: Secondary | ICD-10-CM | POA: Diagnosis not present

## 2020-08-26 DIAGNOSIS — I4811 Longstanding persistent atrial fibrillation: Secondary | ICD-10-CM | POA: Diagnosis not present

## 2020-08-26 DIAGNOSIS — I2510 Atherosclerotic heart disease of native coronary artery without angina pectoris: Secondary | ICD-10-CM | POA: Diagnosis not present

## 2020-08-26 DIAGNOSIS — M25561 Pain in right knee: Secondary | ICD-10-CM | POA: Diagnosis not present

## 2020-08-26 DIAGNOSIS — Z9181 History of falling: Secondary | ICD-10-CM | POA: Diagnosis not present

## 2020-08-26 DIAGNOSIS — H401122 Primary open-angle glaucoma, left eye, moderate stage: Secondary | ICD-10-CM | POA: Diagnosis not present

## 2020-08-26 DIAGNOSIS — E871 Hypo-osmolality and hyponatremia: Secondary | ICD-10-CM

## 2020-08-26 DIAGNOSIS — Z853 Personal history of malignant neoplasm of breast: Secondary | ICD-10-CM | POA: Diagnosis not present

## 2020-08-26 LAB — COMPREHENSIVE METABOLIC PANEL
ALT: 11 U/L (ref 0–44)
AST: 15 U/L (ref 15–41)
Albumin: 3.9 g/dL (ref 3.5–5.0)
Alkaline Phosphatase: 37 U/L — ABNORMAL LOW (ref 38–126)
Anion gap: 11 (ref 5–15)
BUN: 17 mg/dL (ref 8–23)
CO2: 26 mmol/L (ref 22–32)
Calcium: 9.3 mg/dL (ref 8.9–10.3)
Chloride: 101 mmol/L (ref 98–111)
Creatinine, Ser: 0.98 mg/dL (ref 0.44–1.00)
GFR calc Af Amer: 58 mL/min — ABNORMAL LOW (ref >60)
GFR calc non Af Amer: 50 mL/min — ABNORMAL LOW (ref >60)
Glucose, Bld: 143 mg/dL — ABNORMAL HIGH (ref 70–99)
Potassium: 3.8 mmol/L (ref 3.5–5.1)
Sodium: 138 mmol/L (ref 135–145)
Total Bilirubin: 0.5 mg/dL (ref 0.3–1.2)
Total Protein: 7.3 g/dL (ref 6.5–8.1)

## 2020-08-26 MED ORDER — COLCHICINE 0.6 MG PO TABS: 0.6000 mg | ORAL_TABLET | Freq: Every day | ORAL | 0 refills | Status: DC | PRN

## 2020-08-26 MED ORDER — POTASSIUM CHLORIDE CRYS ER 10 MEQ PO TBCR: 10.0000 meq | EXTENDED_RELEASE_TABLET | Freq: Once | ORAL | 1 refills | Status: DC

## 2020-08-26 NOTE — Progress Notes (Signed)
Date:  08/26/2020   Name:  Donna Santos   DOB:  Aug 01, 1925   MRN:  [id number]   Chief Complaint: Hospitalization Follow-up (9/4-9/7 checked her sodium at hospital) Hospitalized at [facility] 9/4 for knee pain and effusion.  CT showed probable tibial plateau fracture with large lipohemarthrosis. Aspiration showed calcium pyrophosphate crystals.  Treated with colchicine.  Hypokalemia - noted in hospital.  Chlorthalidone was stopped.  Potassium supplement has not been taken in some time per family.  BP has been good at home.  Patient denies muscle cramps or spasms.  Cognitive decline - pt is now living with son and daughter in law.  She has not driving in at least 2 years.  Daughter in law says that her confusion is essentially unchanged.  Patient decided not to take Namenda given at last visit.  She is cooperative and pleasant, good sleep and appetite.   Lab Results  Component Value Date   CREATININE 0.93 06/16/2020   BUN 19 06/16/2020   NA 128 (L) 06/16/2020   K 3.5 06/16/2020   CL 91 (L) 06/16/2020   CO2 26 06/16/2020   Lab Results  Component Value Date   CHOL 261 (A) 07/24/2013   HDL 44 07/24/2013   LDLCALC 179 07/24/2013   TRIG 192 (A) 07/24/2013   Lab Results  Component Value Date   TSH 3.300 05/02/2019   No results found for: HGBA1C Lab Results  Component Value Date   WBC 7.1 06/16/2020   HGB 14.0 06/16/2020   HCT 41.1 06/16/2020   MCV 90.5 06/16/2020   PLT 375 06/16/2020   Lab Results  Component Value Date   ALT 18 06/16/2020   AST 19 06/16/2020   ALKPHOS 39 06/16/2020   BILITOT 0.9 06/16/2020     Review of Systems  Constitutional: Negative for chills, diaphoresis, fatigue and fever.  HENT: Negative for trouble swallowing.   Respiratory: Negative for chest tightness and shortness of breath.   Cardiovascular: Negative for chest pain and leg swelling.  Gastrointestinal: Negative for abdominal pain, constipation and diarrhea.  Genitourinary: Negative for  dysuria.  Musculoskeletal: Positive for arthralgias (right knee improved but still hurts at times) and gait problem.  Neurological: Negative for dizziness and headaches.  Psychiatric/Behavioral: Positive for confusion. Negative for agitation and hallucinations. The patient is not nervous/anxious.     Patient Active Problem List   Diagnosis Date Noted  . Pseudogout of right knee 08/04/2020  . Aortic atherosclerosis ([place]) 07/10/2020  . Hyponatremia 06/16/2020  . Hypokalemia 12/17/2019  . Chest wall recurrence of right breast cancer ([place]) 11/16/2019  . Osteoporosis 06/17/2019  . Primary open angle glaucoma (POAG) of left eye, moderate stage 04/25/2018  . Persistent atrial fibrillation ([place]) 12/28/2016  . Carcinoma of overlapping sites of right breast in female, estrogen receptor positive ([place]) 12/21/2016  . Arthritis of right knee 08/03/2016  . Senile dementia, uncomplicated ([place]) 25/95/6387  . Low back pain 04/12/2016  . Impaired renal function 05/09/2015  . Arteriosclerosis of coronary artery 05/09/2015  . Essential (primary) hypertension 05/09/2015  . Personal history of malignant neoplasm of breast 05/09/2015  . Muscle spasms of neck 05/09/2015  . Arthritis of shoulder region, degenerative 05/09/2015  . Hypercholesteremia 04/20/2012  . Carotid artery disease ([place]) 05/28/2005    Allergies  Allergen Reactions  . Ace Inhibitors     Other reaction(s): Angioedema  . Brinzolamide-Brimonidine     Past Surgical History:  Procedure Laterality Date  . CAROTID ENDARTERECTOMY    . CORONARY  ARTERY BYPASS GRAFT  2006  . MASS EXCISION Right 08/28/2015   Procedure: EXCISION MASS; remove massive right chest wall;  Surgeon: Florene Glen, MD;  Location: [facility] ORS;  Service: General;  Laterality: Right;  . MASTECTOMY, RADICAL Right 2002   BREAST CA  . TOTAL ABDOMINAL HYSTERECTOMY      Social History   Tobacco Use  . Smoking status: Former Smoker    Packs/day: 1.50    Years: 1.50      Pack years: 2.25    Types: Cigarettes    Quit date: 10/04/1952    Years since quitting: 67.9  . Smokeless tobacco: Never Used  Vaping Use  . Vaping Use: Never used  Substance Use Topics  . Alcohol use: No    Alcohol/week: 0.0 standard drinks  . Drug use: No     Medication list has been reviewed and updated.  Current Meds  Medication Sig  . amLODipine (NORVASC) 5 MG tablet TAKE 1 TABLET BY MOUTH TWICE DAILY (Patient taking differently: No sig reported)  . anastrozole (ARIMIDEX) 1 MG tablet TAKE 1 TABLET(1 MG) BY MOUTH DAILY  . apixaban (ELIQUIS) 2.5 MG TABS tablet Take 1 tablet (2.5 mg total) by mouth 2 (two) times daily.  Marland Kitchen aspirin EC 81 MG tablet Take 81 mg by mouth daily.  . Calcium Carb-Cholecalciferol (CALCIUM 600+D) 600-800 MG-UNIT TABS Take 1 tablet by mouth daily. Reported on 12/25/2015  . Cholecalciferol (VITAMIN D-3) 125 MCG (5000 UT) TABS Take 1 tablet by mouth daily.  Marland Kitchen ibuprofen (ADVIL,MOTRIN) 200 MG tablet Take 200 mg by mouth every 6 (six) hours as needed.   . isosorbide mononitrate (IMDUR) 30 MG 24 hr tablet TAKE 1 TABLET(30 MG) BY MOUTH DAILY  . latanoprost (XALATAN) 0.005 % ophthalmic solution INT 1 GTT INTO EACH EYE QHS.  Marland Kitchen losartan (COZAAR) 25 MG tablet TAKE 1 TABLET(25 MG) BY MOUTH DAILY  . metoprolol succinate (TOPROL-XL) 50 MG 24 hr tablet TAKE 1 TABLET BY MOUTH EVERY NIGHT  . nitroGLYCERIN (NITROSTAT) 0.4 MG SL tablet Place 0.4 mg under the tongue every 5 (five) minutes as needed for chest pain.   . potassium chloride (KLOR-CON) 10 MEQ tablet Take 10 mEq by mouth 2 (two) times daily.  . timolol (TIMOPTIC) 0.25 % ophthalmic solution INSTILL ONE DROP INTO BOTH EYES EVERY MORNING.  . TURMERIC PO Take 1 tablet by mouth daily.    PHQ 2/9 Scores 06/30/2020 11/16/2019 06/04/2019 05/28/2019  PHQ - 2 Score 0 0 0 0  PHQ- 9 Score - 0 - -  Exception Documentation - - - -   6CIT Screen 08/26/2020 07/09/2020 05/28/2019  What Year? 0 points 4 points 0 points  What month? 3  points 3 points 3 points  What time? 3 points 3 points 0 points  Count back from 20 0 points 0 points 0 points  Months in reverse 2 points 4 points 0 points  Repeat phrase 8 points 10 points 8 points  Total Score 16 24 11       BP Readings from Last 3 Encounters:  08/26/20 130/74  07/09/20 122/70  06/16/20 (!) 152/75    Physical Exam Vitals and nursing note reviewed.  Constitutional:      General: She is not in acute distress.    Appearance: She is well-developed.  HENT:     Head: Normocephalic and atraumatic.  Cardiovascular:     Rate and Rhythm: Normal rate and regular rhythm.     Pulses: Normal pulses.  Pulmonary:  Effort: Pulmonary effort is normal. No respiratory distress.     Breath sounds: No wheezing or rhonchi.  Musculoskeletal:     Cervical back: Normal range of motion.     Right knee: Deformity, bony tenderness and crepitus present. No swelling, effusion or erythema.     Right lower leg: No edema.     Left lower leg: No edema.  Lymphadenopathy:     Cervical: No cervical adenopathy.  Skin:    General: Skin is warm and dry.     Capillary Refill: Capillary refill takes less than 2 seconds.     Findings: No rash.  Neurological:     Mental Status: She is alert. Mental status is at baseline.  Psychiatric:        Mood and Affect: Mood normal.        Behavior: Behavior normal.     Wt Readings from Last 3 Encounters:  08/26/20 124 lb (56.2 kg)  07/09/20 126 lb 3.2 oz (57.2 kg)  06/16/20 133 lb 8 oz (60.6 kg)    BP 130/74 (BP Location: Left Arm, Patient Position: Sitting)   Pulse 70   Temp 98.3 F (36.8 C) (Oral)   Ht 5' (1.524 m)   Wt 124 lb (56.2 kg)   SpO2 98%   BMI 24.22 kg/m   Assessment and Plan: 1. Pseudogout of right knee Much improved; recommend colchicine PRN knee pain/swelling - colchicine 0.6 MG tablet; Take 1 tablet (0.6 mg total) by mouth daily as needed. Knee pain  Dispense: 30 tablet; Refill: 0  2. Hypokalemia Remain off of  diuretic Continue low dose supplement Recheck labs today - Comprehensive metabolic panel - potassium chloride (KLOR-CON) 10 MEQ tablet; Take 1 tablet (10 mEq total) by mouth once for 1 dose.  Dispense: 90 tablet; Refill: 1  3. Hyponatremia Recheck labs - may be improved off of diuretic - Comprehensive metabolic panel  4. Essential (primary) hypertension Clinically stable exam with well controlled BP despite stopping diuretic. Tolerating medications without side effects at this time. Pt to continue current regimen of metoprolol, amlodipine and losartan.  5. Senile dementia, uncomplicated ([place]) Stable currently; declined medications Living with family in a supportive environment  No active concerns today  Partially dictated using Editor, commissioning. Any errors are unintentional.  Halina Maidens, MD [facility]  08/26/2020

## 2020-09-03 DIAGNOSIS — H401122 Primary open-angle glaucoma, left eye, moderate stage: Secondary | ICD-10-CM | POA: Diagnosis not present

## 2020-09-03 DIAGNOSIS — I4811 Longstanding persistent atrial fibrillation: Secondary | ICD-10-CM | POA: Diagnosis not present

## 2020-09-03 DIAGNOSIS — Z9181 History of falling: Secondary | ICD-10-CM | POA: Diagnosis not present

## 2020-09-03 DIAGNOSIS — E876 Hypokalemia: Secondary | ICD-10-CM | POA: Diagnosis not present

## 2020-09-03 DIAGNOSIS — I2510 Atherosclerotic heart disease of native coronary artery without angina pectoris: Secondary | ICD-10-CM | POA: Diagnosis not present

## 2020-09-03 DIAGNOSIS — Z853 Personal history of malignant neoplasm of breast: Secondary | ICD-10-CM | POA: Diagnosis not present

## 2020-09-03 DIAGNOSIS — I10 Essential (primary) hypertension: Secondary | ICD-10-CM | POA: Diagnosis not present

## 2020-09-03 DIAGNOSIS — M25561 Pain in right knee: Secondary | ICD-10-CM | POA: Diagnosis not present

## 2020-09-03 DIAGNOSIS — E7800 Pure hypercholesterolemia, unspecified: Secondary | ICD-10-CM | POA: Diagnosis not present

## 2020-09-03 DIAGNOSIS — Z951 Presence of aortocoronary bypass graft: Secondary | ICD-10-CM | POA: Diagnosis not present

## 2020-09-03 DIAGNOSIS — M11261 Other chondrocalcinosis, right knee: Secondary | ICD-10-CM | POA: Diagnosis not present

## 2020-09-04 ENCOUNTER — Other Ambulatory Visit (INDEPENDENT_AMBULATORY_CARE_PROVIDER_SITE_OTHER): Payer: Medicare Other | Admitting: Internal Medicine

## 2020-09-04 DIAGNOSIS — Z951 Presence of aortocoronary bypass graft: Secondary | ICD-10-CM | POA: Diagnosis not present

## 2020-09-04 DIAGNOSIS — I4819 Other persistent atrial fibrillation: Secondary | ICD-10-CM

## 2020-09-04 DIAGNOSIS — I10 Essential (primary) hypertension: Secondary | ICD-10-CM | POA: Diagnosis not present

## 2020-09-04 DIAGNOSIS — Z170 Estrogen receptor positive status [ER+]: Secondary | ICD-10-CM

## 2020-09-04 DIAGNOSIS — F0390 Unspecified dementia without behavioral disturbance: Secondary | ICD-10-CM

## 2020-09-04 DIAGNOSIS — E7800 Pure hypercholesterolemia, unspecified: Secondary | ICD-10-CM

## 2020-09-04 DIAGNOSIS — I6529 Occlusion and stenosis of unspecified carotid artery: Secondary | ICD-10-CM

## 2020-09-04 DIAGNOSIS — E876 Hypokalemia: Secondary | ICD-10-CM | POA: Diagnosis not present

## 2020-09-04 DIAGNOSIS — C50811 Malignant neoplasm of overlapping sites of right female breast: Secondary | ICD-10-CM

## 2020-09-04 DIAGNOSIS — Z853 Personal history of malignant neoplasm of breast: Secondary | ICD-10-CM

## 2020-09-04 DIAGNOSIS — M11261 Other chondrocalcinosis, right knee: Secondary | ICD-10-CM

## 2020-09-04 DIAGNOSIS — H401122 Primary open-angle glaucoma, left eye, moderate stage: Secondary | ICD-10-CM

## 2020-09-04 DIAGNOSIS — I2510 Atherosclerotic heart disease of native coronary artery without angina pectoris: Secondary | ICD-10-CM | POA: Diagnosis not present

## 2020-09-04 DIAGNOSIS — Z9181 History of falling: Secondary | ICD-10-CM | POA: Diagnosis not present

## 2020-09-04 DIAGNOSIS — M25561 Pain in right knee: Secondary | ICD-10-CM | POA: Diagnosis not present

## 2020-09-04 DIAGNOSIS — I4811 Longstanding persistent atrial fibrillation: Secondary | ICD-10-CM | POA: Diagnosis not present

## 2020-09-04 NOTE — Progress Notes (Signed)
Received orders from [facility] home health. Start of care 08/12/20.   Orders from 08/12/20 through 10/10/20 are reviewed, signed and faxed.

## 2020-09-08 DIAGNOSIS — Z951 Presence of aortocoronary bypass graft: Secondary | ICD-10-CM | POA: Diagnosis not present

## 2020-09-08 DIAGNOSIS — I2510 Atherosclerotic heart disease of native coronary artery without angina pectoris: Secondary | ICD-10-CM | POA: Diagnosis not present

## 2020-09-08 DIAGNOSIS — M11261 Other chondrocalcinosis, right knee: Secondary | ICD-10-CM | POA: Diagnosis not present

## 2020-09-08 DIAGNOSIS — Z853 Personal history of malignant neoplasm of breast: Secondary | ICD-10-CM | POA: Diagnosis not present

## 2020-09-08 DIAGNOSIS — E876 Hypokalemia: Secondary | ICD-10-CM | POA: Diagnosis not present

## 2020-09-08 DIAGNOSIS — I10 Essential (primary) hypertension: Secondary | ICD-10-CM | POA: Diagnosis not present

## 2020-09-08 DIAGNOSIS — I4811 Longstanding persistent atrial fibrillation: Secondary | ICD-10-CM | POA: Diagnosis not present

## 2020-09-08 DIAGNOSIS — H401122 Primary open-angle glaucoma, left eye, moderate stage: Secondary | ICD-10-CM | POA: Diagnosis not present

## 2020-09-08 DIAGNOSIS — M25561 Pain in right knee: Secondary | ICD-10-CM | POA: Diagnosis not present

## 2020-09-08 DIAGNOSIS — E7800 Pure hypercholesterolemia, unspecified: Secondary | ICD-10-CM | POA: Diagnosis not present

## 2020-09-08 DIAGNOSIS — Z9181 History of falling: Secondary | ICD-10-CM | POA: Diagnosis not present

## 2020-09-09 DIAGNOSIS — E876 Hypokalemia: Secondary | ICD-10-CM | POA: Diagnosis not present

## 2020-09-09 DIAGNOSIS — E7800 Pure hypercholesterolemia, unspecified: Secondary | ICD-10-CM | POA: Diagnosis not present

## 2020-09-09 DIAGNOSIS — Z853 Personal history of malignant neoplasm of breast: Secondary | ICD-10-CM | POA: Diagnosis not present

## 2020-09-09 DIAGNOSIS — I10 Essential (primary) hypertension: Secondary | ICD-10-CM | POA: Diagnosis not present

## 2020-09-09 DIAGNOSIS — I4811 Longstanding persistent atrial fibrillation: Secondary | ICD-10-CM | POA: Diagnosis not present

## 2020-09-09 DIAGNOSIS — M11261 Other chondrocalcinosis, right knee: Secondary | ICD-10-CM | POA: Diagnosis not present

## 2020-09-09 DIAGNOSIS — H401122 Primary open-angle glaucoma, left eye, moderate stage: Secondary | ICD-10-CM | POA: Diagnosis not present

## 2020-09-09 DIAGNOSIS — Z951 Presence of aortocoronary bypass graft: Secondary | ICD-10-CM | POA: Diagnosis not present

## 2020-09-09 DIAGNOSIS — M25561 Pain in right knee: Secondary | ICD-10-CM | POA: Diagnosis not present

## 2020-09-09 DIAGNOSIS — Z9181 History of falling: Secondary | ICD-10-CM | POA: Diagnosis not present

## 2020-09-09 DIAGNOSIS — I2510 Atherosclerotic heart disease of native coronary artery without angina pectoris: Secondary | ICD-10-CM | POA: Diagnosis not present

## 2020-09-25 ENCOUNTER — Encounter: Payer: Self-pay | Admitting: Internal Medicine

## 2020-09-25 DIAGNOSIS — D6869 Other thrombophilia: Secondary | ICD-10-CM | POA: Insufficient documentation

## 2020-10-08 ENCOUNTER — Telehealth: Payer: Self-pay | Admitting: Internal Medicine

## 2020-10-08 DIAGNOSIS — I2510 Atherosclerotic heart disease of native coronary artery without angina pectoris: Secondary | ICD-10-CM | POA: Diagnosis not present

## 2020-10-08 DIAGNOSIS — E876 Hypokalemia: Secondary | ICD-10-CM | POA: Diagnosis not present

## 2020-10-08 DIAGNOSIS — E7800 Pure hypercholesterolemia, unspecified: Secondary | ICD-10-CM | POA: Diagnosis not present

## 2020-10-08 DIAGNOSIS — Z951 Presence of aortocoronary bypass graft: Secondary | ICD-10-CM | POA: Diagnosis not present

## 2020-10-08 DIAGNOSIS — Z853 Personal history of malignant neoplasm of breast: Secondary | ICD-10-CM | POA: Diagnosis not present

## 2020-10-08 DIAGNOSIS — Z9181 History of falling: Secondary | ICD-10-CM | POA: Diagnosis not present

## 2020-10-08 DIAGNOSIS — H401122 Primary open-angle glaucoma, left eye, moderate stage: Secondary | ICD-10-CM | POA: Diagnosis not present

## 2020-10-08 DIAGNOSIS — M25561 Pain in right knee: Secondary | ICD-10-CM | POA: Diagnosis not present

## 2020-10-08 DIAGNOSIS — I10 Essential (primary) hypertension: Secondary | ICD-10-CM | POA: Diagnosis not present

## 2020-10-08 DIAGNOSIS — I4811 Longstanding persistent atrial fibrillation: Secondary | ICD-10-CM | POA: Diagnosis not present

## 2020-10-08 DIAGNOSIS — M11261 Other chondrocalcinosis, right knee: Secondary | ICD-10-CM | POA: Diagnosis not present

## 2020-10-14 NOTE — Telephone Encounter (Signed)
Last refill sent on 07/03/20 #180/1 refill, should have another refill left. Attempted to reach pharmacy to verify, on hold too long.

## 2020-10-14 NOTE — Telephone Encounter (Signed)
Son called asking for new refill for the next time he needs to pick the Amlodipine up.  CB#  [phone]

## 2020-11-17 ENCOUNTER — Other Ambulatory Visit: Payer: Self-pay | Admitting: Internal Medicine

## 2020-11-17 MED ORDER — APIXABAN 2.5 MG PO TABS: 2.5000 mg | ORAL_TABLET | Freq: Two times a day (BID) | ORAL | 0 refills | Status: DC

## 2020-11-17 NOTE — Telephone Encounter (Signed)
Copied from [place] [phone]. Topic: Quick Communication - Rx Refill/Question >> Nov 17, 2020  1:46 PM Leward Quan A wrote: Medication: apixaban (ELIQUIS) 2.5 MG TABS tablet  Has the patient contacted their pharmacy? Yes.   (Agent: If no, request that the patient contact the pharmacy for the refill.) (Agent: If yes, when and what did the pharmacy advise?)  Preferred Pharmacy (with phone number or street name): [id number], [address] AT [place]  Phone:  [phone] Fax:  [phone]     Agent: Please be advised that RX refills may take up to 3 business days. We ask that you follow-up with your pharmacy.

## 2020-11-25 ENCOUNTER — Other Ambulatory Visit: Payer: Self-pay | Admitting: Internal Medicine

## 2020-12-16 NOTE — Progress Notes (Signed)
[facility]  [address] [place], [place] [address] Phone: [phone]  Fax: [phone]   Clinic Day:  12/17/2020  Referring physician: Glean Hess, MD  Chief Complaint: Donna Santos is a 87 y.o. female with recurrent right breast cancer who is seen for 6 month assessment.  HPI: The patient was last seen in the medical oncology clinic on 06/16/2020. At that time, she felt "fine".  She was tolerating Arimidex well. Hematocrit was 41.4, hemoglobin 14.0, platelets 375,000, WBC 7,100. Sodium was 128. Creatinine was 0.93 (CrCl 53 ml/min). CA27.29 was 13.5.   The patient was seen in the [facility] ER on 06/24/2020 after a fall. CT scan and x ray showed no fractures or dislocations. She was to use a walker to prevent future falls and use Tylenol for pain.  The patient was admitted to [facility] from 08/02/2020 - 08/05/2020 for pseudogout. Per Judson Roch, the patient took colchicine TID while in the hospital.  Left screening mammogram on 08/18/2020 revealed no evidence of malignancy.  During the interim, she has felt "good". She feels that she is eating well and denies nausea, vomiting, and diarrhea. The patient is still having problems with right knee pseudogout. She takes colchicine once daily. She is unsteady on her feet and uses a cane.  She is scheduled to see Dr. Army Melia on 01/20/2020.  The patient is tolerating anastrozole without side effects. She does not perform monthly breast exams.   Past Medical History:  Diagnosis Date  . Arteriosclerosis of coronary artery   . Arthritis of shoulder region   . Breast cancer ([place]) 2002   RT MASTECTOMY  . Cataract   . Essential hypertension   . Glaucoma   . Hypercholesteremia   . Impaired renal function   . Muscle spasms of neck   . Tobacco abuse     Past Surgical History:  Procedure Laterality Date  . CAROTID ENDARTERECTOMY    . CORONARY ARTERY BYPASS GRAFT  2006  . MASS EXCISION Right 08/28/2015    Procedure: EXCISION MASS; remove massive right chest wall;  Surgeon: Florene Glen, MD;  Location: [facility] ORS;  Service: General;  Laterality: Right;  . MASTECTOMY, RADICAL Right 2002   BREAST CA  . TOTAL ABDOMINAL HYSTERECTOMY      Family History  Problem Relation Age of Onset  . Hypertension Father   . Cancer Father   . Breast cancer Mother     Social History:  reports that she quit smoking about 68 years ago. Her smoking use included cigarettes. She has a 2.25 pack-year smoking history. She has never used smokeless tobacco. She reports that she does not drink alcohol and does not use drugs. She does not drink. She denies any known exposure to radiation or toxins. She is retired by worked at [employer] until her children were born. When they were grown, she worked in [employer] with her husband. She lives in [place]. Her son's name is Chip and daughter-in-law, Judson Roch [phone]) .The patient is accompanied by Judson Roch today.   Allergies:  Allergies  Allergen Reactions  . Ace Inhibitors     Other reaction(s): Angioedema  . Brinzolamide-Brimonidine     Current Medications: Current Outpatient Medications  Medication Sig Dispense Refill  . amLODipine (NORVASC) 5 MG tablet TAKE 1 TABLET BY MOUTH TWICE DAILY (Patient taking differently: No sig reported) 180 tablet 1  . anastrozole (ARIMIDEX) 1 MG tablet TAKE 1 TABLET(1 MG) BY MOUTH DAILY 90 tablet 1  . aspirin  EC 81 MG tablet Take 81 mg by mouth daily.    . Calcium Carb-Cholecalciferol 600-800 MG-UNIT TABS Take 1 tablet by mouth daily. Reported on 12/25/2015    . isosorbide mononitrate (IMDUR) 30 MG 24 hr tablet TAKE 1 TABLET(30 MG) BY MOUTH DAILY 90 tablet 3  . latanoprost (XALATAN) 0.005 % ophthalmic solution INT 1 GTT INTO EACH EYE QHS.  6  . losartan (COZAAR) 25 MG tablet TAKE 1 TABLET(25 MG) BY MOUTH DAILY 30 tablet 12  . metoprolol succinate (TOPROL-XL) 50 MG 24 hr tablet TAKE 1 TABLET BY MOUTH EVERY NIGHT 90 tablet 3  .  timolol (TIMOPTIC) 0.25 % ophthalmic solution INSTILL ONE DROP INTO BOTH EYES EVERY MORNING.  6  . TURMERIC PO Take 1 tablet by mouth daily.    Marland Kitchen apixaban (ELIQUIS) 2.5 MG TABS tablet Take 1 tablet (2.5 mg total) by mouth 2 (two) times daily. 60 tablet 0  . chlorthalidone (HYGROTON) 25 MG tablet     . Cholecalciferol (VITAMIN D-3) 125 MCG (5000 UT) TABS Take 1 tablet by mouth daily.    . colchicine 0.6 MG tablet Take 1 tablet (0.6 mg total) by mouth daily as needed. Knee pain 30 tablet 0  . ibuprofen (ADVIL,MOTRIN) 200 MG tablet Take 200 mg by mouth every 6 (six) hours as needed.     . nitroGLYCERIN (NITROSTAT) 0.4 MG SL tablet Place 0.4 mg under the tongue every 5 (five) minutes as needed for chest pain.  (Patient not taking: Reported on 12/17/2020)    . potassium chloride (KLOR-CON) 10 MEQ tablet Take 1 tablet (10 mEq total) by mouth once for 1 dose. 90 tablet 1   No current facility-administered medications for this visit.    Review of Systems  Constitutional: Positive for weight loss (11 lbs). Negative for chills, diaphoresis, fever and malaise/fatigue.       Feels "good."  HENT: Negative.  Negative for congestion, ear discharge, ear pain, hearing loss, nosebleeds, sinus pain, sore throat and tinnitus.   Eyes: Negative.  Negative for blurred vision.  Respiratory: Negative.  Negative for cough, hemoptysis, sputum production, shortness of breath and wheezing.   Cardiovascular: Negative.  Negative for chest pain, palpitations, orthopnea, leg swelling and PND.  Gastrointestinal: Negative.  Negative for abdominal pain, blood in stool, constipation, diarrhea, heartburn, melena, nausea and vomiting.       Feels like she is eating well.  Genitourinary: Negative.  Negative for dysuria, frequency, hematuria and urgency.  Musculoskeletal: Negative for back pain, joint pain, myalgias and neck pain.       Pseudogout.  Skin: Negative.  Negative for itching and rash.  Neurological: Negative.  Negative  for dizziness, tingling, sensory change, weakness and headaches.       Unsteady on her feet. Uses cane.  Endo/Heme/Allergies: Negative.  Does not bruise/bleed easily.  Psychiatric/Behavioral: Positive for memory loss. Negative for depression and substance abuse. The patient is not nervous/anxious and does not have insomnia.   All other systems reviewed and are negative.  Performance status (ECOG): 1  Vitals Blood pressure (!) 156/81, pulse 77, temperature (!) 96 F (35.6 C), temperature source Tympanic, resp. rate 18, weight 122 lb 5.7 oz (55.5 kg), SpO2 96 %.   Physical Exam Vitals and nursing note reviewed.  Constitutional:      General: She is not in acute distress.    Appearance: Normal appearance.     Interventions: Face mask in place.     Comments: Requires assistance onto exam table. Has a  cane by her side.  HENT:     Head: Normocephalic and atraumatic.     Comments: Short white hair.    Mouth/Throat:     Mouth: Mucous membranes are moist.     Pharynx: Oropharynx is clear.  Eyes:     General: No scleral icterus.    Extraocular Movements: Extraocular movements intact.     Conjunctiva/sclera: Conjunctivae normal.     Pupils: Pupils are equal, round, and reactive to light.     Comments: Glasses.  Blue eyes.  Cardiovascular:     Rate and Rhythm: Normal rate and regular rhythm.     Pulses: Normal pulses.     Heart sounds: Normal heart sounds. No murmur heard.   Pulmonary:     Effort: Pulmonary effort is normal. No respiratory distress.     Breath sounds: Normal breath sounds. No wheezing or rales.  Chest:     Chest wall: No tenderness.  Breasts:     Right: Absent. No swelling, mass, skin change, tenderness, axillary adenopathy or supraclavicular adenopathy.     Left: No swelling, bleeding, mass, nipple discharge, skin change, tenderness, axillary adenopathy or supraclavicular adenopathy.    Abdominal:     General: Bowel sounds are normal. There is no distension.      Palpations: Abdomen is soft. There is no mass.     Tenderness: There is no abdominal tenderness. There is no guarding or rebound.     Hernia: A hernia (ventral) is present.  Musculoskeletal:        General: Tenderness (BLE) present. No swelling. Normal range of motion.     Cervical back: Normal range of motion and neck supple.     Right lower leg: Edema (right knee) present.     Left lower leg: No edema.  Lymphadenopathy:     Head:     Right side of head: No preauricular, posterior auricular or occipital adenopathy.     Left side of head: No preauricular, posterior auricular or occipital adenopathy.     Cervical: No cervical adenopathy.     Right cervical: No superficial cervical adenopathy.    Left cervical: No superficial cervical adenopathy.     Upper Body:     Right upper body: No supraclavicular or axillary adenopathy.     Left upper body: No supraclavicular or axillary adenopathy.     Lower Body: No right inguinal adenopathy. No left inguinal adenopathy.  Skin:    General: Skin is warm and dry.     Comments: Bandage on right hand due to falls  Neurological:     Mental Status: She is alert and oriented to person, place, and time. Mental status is at baseline.  Psychiatric:        Mood and Affect: Mood normal.        Behavior: Behavior normal.        Thought Content: Thought content normal.        Judgment: Judgment normal.    Appointment on 12/17/2020  Component Date Value Ref Range Status  . Sodium 12/17/2020 137  135 - 145 mmol/L Final  . Potassium 12/17/2020 4.1  3.5 - 5.1 mmol/L Final  . Chloride 12/17/2020 98  98 - 111 mmol/L Final  . CO2 12/17/2020 27  22 - 32 mmol/L Final  . Glucose, Bld 12/17/2020 125* 70 - 99 mg/dL Final   Glucose reference range applies only to samples taken after fasting for at least 8 hours.  . BUN 12/17/2020 15  8 -  23 mg/dL Final  . Creatinine, Ser 12/17/2020 1.04* 0.44 - 1.00 mg/dL Final  . Calcium 12/17/2020 9.7  8.9 - 10.3 mg/dL Final   . Total Protein 12/17/2020 7.7  6.5 - 8.1 g/dL Final  . Albumin 12/17/2020 4.2  3.5 - 5.0 g/dL Final  . AST 12/17/2020 18  15 - 41 U/L Final  . ALT 12/17/2020 11  0 - 44 U/L Final  . Alkaline Phosphatase 12/17/2020 36* 38 - 126 U/L Final  . Total Bilirubin 12/17/2020 0.6  0.3 - 1.2 mg/dL Final  . GFR, Estimated 12/17/2020 50* >60 mL/min Final   Comment: (NOTE) Calculated using the CKD-EPI Creatinine Equation (2021)   . Anion gap 12/17/2020 12  5 - 15 Final   Performed at [facility], [address], [place], [place] [address]  . WBC 12/17/2020 6.2  4.0 - 10.5 K/uL Final  . RBC 12/17/2020 4.42  3.87 - 5.11 MIL/uL Final  . Hemoglobin 12/17/2020 13.7  12.0 - 15.0 g/dL Final  . HCT 12/17/2020 41.0  36.0 - 46.0 % Final  . MCV 12/17/2020 92.8  80.0 - 100.0 fL Final  . MCH 12/17/2020 31.0  26.0 - 34.0 pg Final  . MCHC 12/17/2020 33.4  30.0 - 36.0 g/dL Final  . RDW 12/17/2020 13.2  11.5 - 15.5 % Final  . Platelets 12/17/2020 330  150 - 400 K/uL Final  . nRBC 12/17/2020 0.0  0.0 - 0.2 % Final  . Neutrophils Relative % 12/17/2020 70  % Final  . Neutro Abs 12/17/2020 4.3  1.7 - 7.7 K/uL Final  . Lymphocytes Relative 12/17/2020 15  % Final  . Lymphs Abs 12/17/2020 0.9  0.7 - 4.0 K/uL Final  . Monocytes Relative 12/17/2020 11  % Final  . Monocytes Absolute 12/17/2020 0.7  0.1 - 1.0 K/uL Final  . Eosinophils Relative 12/17/2020 2  % Final  . Eosinophils Absolute 12/17/2020 0.1  0.0 - 0.5 K/uL Final  . Basophils Relative 12/17/2020 2  % Final  . Basophils Absolute 12/17/2020 0.1  0.0 - 0.1 K/uL Final  . Immature Granulocytes 12/17/2020 0  % Final  . Abs Immature Granulocytes 12/17/2020 0.02  0.00 - 0.07 K/uL Final   Performed at [facility], [address] [place], [place] [address]    Assessment:  Donna Santos is a 87 y.o. female with recurrent right breast cancer.  She presented with low grade DCIS of the right breast in 2002.  Pathology revealed a 1.5 cm  area of DCIS with 5% early invasion.  Tumor was ER+ 74%, PR -, and HER-2/neu -.   She underwent a right mastectomy and axillary lymph node dissection followed by reconstruction.  Pathology showed no residual carcinoma.  She did not receive tamoxifen.   She developed a right chest wall recurrence in 2016.    She underwent excision.  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  Carcinoma extended to the deep margin.  Tumor was ER+ > 90%, PR + >90% and HER-2/neu -.     She received radiation (5000 cGy with a boost of 2000 cGy). She is currently on Arimidex.   CA 27.29 was 14 on 06/18/2019, 21.3 on 12/14/2019, 13.5 on 06/16/2020, and 17.9 on 12/17/2020.  Screening left mammogram on 07/25/2019 revealed a possible asymmetry in left breast.  There were no findings suspicious for malignancy in the right breast.  Diagnostic left mammogram with left axilla  ultrasound on 08/08/2019 showed superimposed fibroglandular tissue. There were no suspicious masses, microcalcifications, or malignancy.  Left screening mammogram on 08/18/2020 revealed no evidence of malignancy.  Bone density on 09/11/2015 showed osteoporosis with a T-score of -2.5 in the right femur neck.  Bone density on 12/06/2014 showed osteoporosis with a T-score of -2.7 in the right femur neck.  She is currently on calcium and Vitamin D.  She declined bisphosphonates.  Bone density on 07/25/2019 revealed osteoporosis with a T-score of -3.1 in the right dual femur and -2.6 in the left forearm radius.   Symptomatically, she feels "good". She is still having problems with right knee pseudogout. She is tolerating anastrozole without side effects. She denies any breast concerns.  Exam is stable.  Plan: 1.   Labs today: CBC with diff, CMP, CA 27.29. 2.   Recurrent right breast cancer             Clinically, she continues to do well.             Exam reveals no evidence of  recurrent disease.    Left screening mammogram on 08/18/2020 revealed no evidence of malignancy.             Encourage monthly breast exams.    Continue Arimidex. 3.   Osteoporosis, progressive             Bone density on 07/25/2019 revealed progressive osteoporosis with a T score of -3.1.             Continue calcium and vitamin D.  Patient declines Prolia or a bisphosphonate.             Next bone density study on 07/24/2021.    Continue to monitor 4.   Patient to Urgent Care re: right knee pseudogout. 5.   RTC in 6 months for MD assessment and labs (CBC with diff, CMP, CA 27.29).  I discussed the assessment and treatment plan with the patient.  The patient was provided an opportunity to ask questions and all were answered.  The patient agreed with the plan and demonstrated an understanding of the instructions.  The patient was advised to call back if the symptoms worsen or if the condition fails to improve as anticipated.  I provided 24 minutes of face-to-face time during this this encounter and > 50% was spent counseling as documented under my assessment and plan.   Lequita Asal, MD, PhD    12/17/2020, 11:54 AM  I, De Burrs, am acting as Education administrator for [employer]. Mike Gip, MD, PhD.  I, Stepheni Cameron C. Mike Gip, MD, have reviewed the above documentation for accuracy and completeness, and I agree with the above.

## 2020-12-17 ENCOUNTER — Inpatient Hospital Stay: Payer: Medicare Other | Attending: Hematology and Oncology | Admitting: Hematology and Oncology

## 2020-12-17 ENCOUNTER — Inpatient Hospital Stay: Payer: Medicare Other

## 2020-12-17 ENCOUNTER — Other Ambulatory Visit: Payer: Self-pay

## 2020-12-17 VITALS — BP 156/81 | HR 77 | Temp 96.0°F | Resp 18 | Wt 122.4 lb

## 2020-12-17 DIAGNOSIS — C50911 Malignant neoplasm of unspecified site of right female breast: Secondary | ICD-10-CM

## 2020-12-17 DIAGNOSIS — C7989 Secondary malignant neoplasm of other specified sites: Secondary | ICD-10-CM

## 2020-12-17 DIAGNOSIS — Z79811 Long term (current) use of aromatase inhibitors: Secondary | ICD-10-CM | POA: Insufficient documentation

## 2020-12-17 DIAGNOSIS — M810 Age-related osteoporosis without current pathological fracture: Secondary | ICD-10-CM | POA: Diagnosis not present

## 2020-12-17 DIAGNOSIS — Z170 Estrogen receptor positive status [ER+]: Secondary | ICD-10-CM | POA: Insufficient documentation

## 2020-12-17 DIAGNOSIS — C50811 Malignant neoplasm of overlapping sites of right female breast: Secondary | ICD-10-CM | POA: Insufficient documentation

## 2020-12-17 DIAGNOSIS — Z87891 Personal history of nicotine dependence: Secondary | ICD-10-CM | POA: Diagnosis not present

## 2020-12-17 DIAGNOSIS — E871 Hypo-osmolality and hyponatremia: Secondary | ICD-10-CM

## 2020-12-17 DIAGNOSIS — Z9011 Acquired absence of right breast and nipple: Secondary | ICD-10-CM | POA: Insufficient documentation

## 2020-12-17 DIAGNOSIS — E876 Hypokalemia: Secondary | ICD-10-CM

## 2020-12-17 LAB — CBC WITH DIFFERENTIAL/PLATELET
Abs Immature Granulocytes: 0.02 10*3/uL (ref 0.00–0.07)
Basophils Absolute: 0.1 10*3/uL (ref 0.0–0.1)
Basophils Relative: 2 %
Eosinophils Absolute: 0.1 10*3/uL (ref 0.0–0.5)
Eosinophils Relative: 2 %
HCT: 41 % (ref 36.0–46.0)
Hemoglobin: 13.7 g/dL (ref 12.0–15.0)
Immature Granulocytes: 0 %
Lymphocytes Relative: 15 %
Lymphs Abs: 0.9 10*3/uL (ref 0.7–4.0)
MCH: 31 pg (ref 26.0–34.0)
MCHC: 33.4 g/dL (ref 30.0–36.0)
MCV: 92.8 fL (ref 80.0–100.0)
Monocytes Absolute: 0.7 10*3/uL (ref 0.1–1.0)
Monocytes Relative: 11 %
Neutro Abs: 4.3 10*3/uL (ref 1.7–7.7)
Neutrophils Relative %: 70 %
Platelets: 330 10*3/uL (ref 150–400)
RBC: 4.42 MIL/uL (ref 3.87–5.11)
RDW: 13.2 % (ref 11.5–15.5)
WBC: 6.2 10*3/uL (ref 4.0–10.5)
nRBC: 0 % (ref 0.0–0.2)

## 2020-12-17 LAB — COMPREHENSIVE METABOLIC PANEL
ALT: 11 U/L (ref 0–44)
AST: 18 U/L (ref 15–41)
Albumin: 4.2 g/dL (ref 3.5–5.0)
Alkaline Phosphatase: 36 U/L — ABNORMAL LOW (ref 38–126)
Anion gap: 12 (ref 5–15)
BUN: 15 mg/dL (ref 8–23)
CO2: 27 mmol/L (ref 22–32)
Calcium: 9.7 mg/dL (ref 8.9–10.3)
Chloride: 98 mmol/L (ref 98–111)
Creatinine, Ser: 1.04 mg/dL — ABNORMAL HIGH (ref 0.44–1.00)
GFR, Estimated: 50 mL/min — ABNORMAL LOW (ref >60)
Glucose, Bld: 125 mg/dL — ABNORMAL HIGH (ref 70–99)
Potassium: 4.1 mmol/L (ref 3.5–5.1)
Sodium: 137 mmol/L (ref 135–145)
Total Bilirubin: 0.6 mg/dL (ref 0.3–1.2)
Total Protein: 7.7 g/dL (ref 6.5–8.1)

## 2020-12-17 NOTE — Progress Notes (Signed)
Pseudogout, painful, in right knee

## 2020-12-18 LAB — CANCER ANTIGEN 27.29: CA 27.29: 17.9 U/mL (ref 0.0–38.6)

## 2020-12-19 ENCOUNTER — Telehealth: Payer: Self-pay | Admitting: *Deleted

## 2020-12-19 NOTE — Telephone Encounter (Signed)
Donna Santos daughter in law called requesting a return call to discuss te AVS stating that patient has chest wall recurrence as family was unaware if this

## 2020-12-22 ENCOUNTER — Telehealth: Payer: Self-pay | Admitting: *Deleted

## 2020-12-22 NOTE — Telephone Encounter (Signed)
Patient and family read in My Chart that she has "recurring chest wall disease". Daughter in law would like to speak with Dr. Mike Gip about this diagnosis she states that the family and patient were unaware that her cancer had recurred.

## 2020-12-23 NOTE — Telephone Encounter (Signed)
Patient's family called again this morning requesting more information about the patient's diagnosis of recurring chest wall disease.

## 2020-12-24 ENCOUNTER — Encounter: Payer: Self-pay | Admitting: *Deleted

## 2020-12-25 ENCOUNTER — Other Ambulatory Visit: Payer: Self-pay | Admitting: Internal Medicine

## 2020-12-25 ENCOUNTER — Other Ambulatory Visit: Payer: Self-pay | Admitting: *Deleted

## 2020-12-25 MED ORDER — APIXABAN 2.5 MG PO TABS: 2.5000 mg | ORAL_TABLET | Freq: Two times a day (BID) | ORAL | 0 refills | Status: DC

## 2020-12-25 MED ORDER — ANASTROZOLE 1 MG PO TABS: ORAL_TABLET | ORAL | 1 refills | Status: DC

## 2020-12-25 NOTE — Telephone Encounter (Signed)
Medication:  apixaban (ELIQUIS) 2.5 MG TABS tablet [[id number]]   Has the patient contacted their pharmacy? Yes   (Agent: If yes, when and what did the pharmacy advise?) to call the office   Preferred Pharmacy (with phone number or street name):  [facility] [place], [place] [address]  [place], [place] [address]  Phone:  [phone] Fax:  [phone]  Agent: Please be advised that RX refills may take up to 3 business days. We ask that you follow-up with your pharmacy.

## 2021-01-01 ENCOUNTER — Other Ambulatory Visit: Payer: Self-pay | Admitting: Internal Medicine

## 2021-01-01 MED ORDER — AMLODIPINE BESYLATE 5 MG PO TABS
5.0000 mg | ORAL_TABLET | Freq: Two times a day (BID) | ORAL | 0 refills | Status: DC
Start: 2021-01-01 — End: 2021-01-07

## 2021-01-01 MED ORDER — AMLODIPINE BESYLATE 5 MG PO TABS
5.0000 mg | ORAL_TABLET | Freq: Two times a day (BID) | ORAL | 0 refills | Status: DC
Start: 2021-01-01 — End: 2021-01-01

## 2021-01-01 NOTE — Telephone Encounter (Signed)
Medication Refill - Medication: amlodipine 5 mg  Has the patient contacted their pharmacy? Yes.   (Agent: If no, request that the patient contact the pharmacy for the refill.) (Agent: If yes, when and what did the pharmacy advise?)  Preferred Pharmacy (with phone number or street name):[facility] [place], [place]: Please be advised that RX refills may take up to 3 business days. We ask that you follow-up with your pharmacy.

## 2021-01-01 NOTE — Telephone Encounter (Signed)
Requested Prescriptions  Pending Prescriptions Disp Refills  . amLODipine (NORVASC) 5 MG tablet 180 tablet 0    Sig: Take 1 tablet (5 mg total) by mouth 2 (two) times daily.     Cardiovascular:  Calcium Channel Blockers Failed - 01/01/2021  2:15 PM      Failed - Last BP in normal range    BP Readings from Last 1 Encounters:  12/17/20 (!) 156/81         Passed - Valid encounter within last 6 months    Recent Outpatient Visits          4 months ago Pseudogout of right knee   [place], MD   5 months ago Senile dementia, uncomplicated [facility])   [place], Laura H, MD   1 year ago Essential (primary) hypertension   [facility] Glean Hess, MD   1 year ago Acute pain of right knee   [facility] Glean Hess, MD   1 year ago Annual physical exam   [facility] Glean Hess, MD      Future Appointments            In 5 days Glean Hess, MD [facility]

## 2021-01-01 NOTE — Telephone Encounter (Signed)
Son called concerning refill sent today. He manages her meds now and lives in [place].  He would like you to lease resend Rx for amLODipine (NORVASC) 5 MG tablet  to   [place], [place] [address] AT [place]

## 2021-01-02 ENCOUNTER — Other Ambulatory Visit: Payer: Self-pay | Admitting: Internal Medicine

## 2021-01-06 ENCOUNTER — Ambulatory Visit (INDEPENDENT_AMBULATORY_CARE_PROVIDER_SITE_OTHER): Payer: Medicare Other | Admitting: Internal Medicine

## 2021-01-06 ENCOUNTER — Other Ambulatory Visit: Payer: Self-pay

## 2021-01-06 ENCOUNTER — Encounter: Payer: Self-pay | Admitting: Internal Medicine

## 2021-01-06 VITALS — BP 106/66 | HR 67 | Ht 60.0 in | Wt 122.0 lb

## 2021-01-06 DIAGNOSIS — I779 Disorder of arteries and arterioles, unspecified: Secondary | ICD-10-CM

## 2021-01-06 DIAGNOSIS — E871 Hypo-osmolality and hyponatremia: Secondary | ICD-10-CM | POA: Diagnosis not present

## 2021-01-06 DIAGNOSIS — F0390 Unspecified dementia without behavioral disturbance: Secondary | ICD-10-CM

## 2021-01-06 DIAGNOSIS — I10 Essential (primary) hypertension: Secondary | ICD-10-CM | POA: Diagnosis not present

## 2021-01-06 DIAGNOSIS — D6869 Other thrombophilia: Secondary | ICD-10-CM | POA: Diagnosis not present

## 2021-01-06 DIAGNOSIS — I4819 Other persistent atrial fibrillation: Secondary | ICD-10-CM | POA: Diagnosis not present

## 2021-01-06 DIAGNOSIS — I2510 Atherosclerotic heart disease of native coronary artery without angina pectoris: Secondary | ICD-10-CM | POA: Diagnosis not present

## 2021-01-06 NOTE — Progress Notes (Signed)
Date:  01/06/2021   Name:  Donna Santos   DOB:  1925-01-07   MRN:  [id number]   Chief Complaint: Hypertension (Follow up.)  Hypertension This is a chronic problem. The problem is controlled. Pertinent negatives include no chest pain, headaches, palpitations or shortness of breath. Past treatments include angiotensin blockers, calcium channel blockers, beta blockers and diuretics. The current treatment provides significant improvement. Hypertensive end-organ damage includes kidney disease (mild decrease in GFR) and CAD/MI.  Hyponatremia - recent labs show resolution since stopping diuretics. Hypokalemia - recent labs show normal potassium levels Dementia - seen by Neurology; suggested Namenda.  She continues to live alone. Son checks on her several times a day by phone.  She gets meals delivered every week that can be microwaved.  Atrial fibrillation - on metoprolol for rate control and anticoagulated on Eliquis.   No bleeding issues noted.  Lab Results  Component Value Date   CREATININE 1.04 (H) 12/17/2020   BUN 15 12/17/2020   NA 137 12/17/2020   K 4.1 12/17/2020   CL 98 12/17/2020   CO2 27 12/17/2020   Lab Results  Component Value Date   CHOL 261 (A) 07/24/2013   HDL 44 07/24/2013   LDLCALC 179 07/24/2013   TRIG 192 (A) 07/24/2013   Lab Results  Component Value Date   TSH 3.300 05/02/2019   No results found for: HGBA1C Lab Results  Component Value Date   WBC 6.2 12/17/2020   HGB 13.7 12/17/2020   HCT 41.0 12/17/2020   MCV 92.8 12/17/2020   PLT 330 12/17/2020   Lab Results  Component Value Date   ALT 11 12/17/2020   AST 18 12/17/2020   ALKPHOS 36 (L) 12/17/2020   BILITOT 0.6 12/17/2020     Review of Systems  Constitutional: Negative for appetite change, fatigue, fever and unexpected weight change.  HENT: Negative for trouble swallowing.   Eyes: Negative for visual disturbance.  Respiratory: Negative for cough, chest tightness and shortness of breath.    Cardiovascular: Negative for chest pain, palpitations and leg swelling.  Gastrointestinal: Negative for abdominal pain.  Genitourinary: Negative for dysuria and hematuria.  Musculoskeletal: Positive for arthralgias (right knee pseudogout - no benefit from colchicine), gait problem and joint swelling.  Neurological: Negative for tremors, numbness and headaches.  Hematological: Bruises/bleeds easily (on both hands; no urinary/rectal/oral bleeding).  Psychiatric/Behavioral: Positive for confusion. Negative for agitation, dysphoric mood and sleep disturbance.    Patient Active Problem List   Diagnosis Date Noted  . Acquired thrombophilia ([place]) 09/25/2020  . Pseudogout of right knee 08/04/2020  . Aortic atherosclerosis ([place]) 07/10/2020  . Hyponatremia 06/16/2020  . Hypokalemia 12/17/2019  . Chest wall recurrence of right breast cancer ([place]) 11/16/2019  . Osteoporosis 06/17/2019  . Primary open angle glaucoma (POAG) of left eye, moderate stage 04/25/2018  . Persistent atrial fibrillation ([place]) 12/28/2016  . Carcinoma of overlapping sites of right breast in female, estrogen receptor positive ([place]) 12/21/2016  . Senile dementia, uncomplicated ([place]) 41/66/0630  . Low back pain 04/12/2016  . Impaired renal function 05/09/2015  . Arteriosclerosis of coronary artery 05/09/2015  . Essential (primary) hypertension 05/09/2015  . Personal history of malignant neoplasm of breast 05/09/2015  . Muscle spasms of neck 05/09/2015  . Arthritis of shoulder region, degenerative 05/09/2015  . Hypercholesteremia 04/20/2012  . Carotid artery disease ([place]) 05/28/2005    Allergies  Allergen Reactions  . Ace Inhibitors     Other reaction(s): Angioedema  . Brinzolamide-Brimonidine  Past Surgical History:  Procedure Laterality Date  . CAROTID ENDARTERECTOMY    . CORONARY ARTERY BYPASS GRAFT  2006  . MASS EXCISION Right 08/28/2015   Procedure: EXCISION MASS; remove massive right chest wall;  Surgeon:  Florene Glen, MD;  Location: [facility] ORS;  Service: General;  Laterality: Right;  . MASTECTOMY, RADICAL Right 2002   BREAST CA  . TOTAL ABDOMINAL HYSTERECTOMY      Social History   Tobacco Use  . Smoking status: Former Smoker    Packs/day: 1.50    Years: 1.50    Pack years: 2.25    Types: Cigarettes    Quit date: 10/04/1952    Years since quitting: 68.3  . Smokeless tobacco: Never Used  Vaping Use  . Vaping Use: Never used  Substance Use Topics  . Alcohol use: No    Alcohol/week: 0.0 standard drinks  . Drug use: No     Medication list has been reviewed and updated.  Current Meds  Medication Sig  . amLODipine (NORVASC) 5 MG tablet Take 1 tablet (5 mg total) by mouth 2 (two) times daily.  Marland Kitchen anastrozole (ARIMIDEX) 1 MG tablet TAKE 1 TABLET(1 MG) BY MOUTH DAILY  . aspirin EC 81 MG tablet Take 81 mg by mouth daily.  . Calcium Carb-Cholecalciferol 600-800 MG-UNIT TABS Take 1 tablet by mouth daily. Reported on 12/25/2015  . Cholecalciferol (VITAMIN D-3) 125 MCG (5000 UT) TABS Take 1 tablet by mouth daily.  . colchicine 0.6 MG tablet Take 1 tablet (0.6 mg total) by mouth daily as needed. Knee pain  . ELIQUIS 2.5 MG TABS tablet TAKE 1 TABLET(2.5 MG) BY MOUTH TWICE DAILY  . ibuprofen (ADVIL,MOTRIN) 200 MG tablet Take 200 mg by mouth every 6 (six) hours as needed.  . isosorbide mononitrate (IMDUR) 30 MG 24 hr tablet TAKE 1 TABLET(30 MG) BY MOUTH DAILY  . latanoprost (XALATAN) 0.005 % ophthalmic solution INT 1 GTT INTO EACH EYE QHS.  Marland Kitchen losartan (COZAAR) 25 MG tablet TAKE 1 TABLET(25 MG) BY MOUTH DAILY  . metoprolol succinate (TOPROL-XL) 50 MG 24 hr tablet TAKE 1 TABLET BY MOUTH EVERY NIGHT  . nitroGLYCERIN (NITROSTAT) 0.4 MG SL tablet Place 0.4 mg under the tongue every 5 (five) minutes as needed for chest pain.  . potassium chloride (KLOR-CON) 10 MEQ tablet Take 1 tablet (10 mEq total) by mouth once for 1 dose.  . timolol (TIMOPTIC) 0.25 % ophthalmic solution INSTILL ONE DROP INTO  BOTH EYES EVERY MORNING.  . TURMERIC PO Take 1 tablet by mouth daily.    PHQ 2/9 Scores 01/06/2021 06/30/2020 11/16/2019 06/04/2019  PHQ - 2 Score 0 0 0 0  PHQ- 9 Score 0 - 0 -  Exception Documentation - - - -    GAD 7 : Generalized Anxiety Score 01/06/2021  Nervous, Anxious, on Edge 0  Control/stop worrying 0  Worry too much - different things 0  Trouble relaxing 0  Restless 0  Easily annoyed or irritable 0  Afraid - awful might happen 0  Total GAD 7 Score 0  Anxiety Difficulty Not difficult at all    BP Readings from Last 3 Encounters:  01/06/21 106/66  12/17/20 (!) 156/81  08/26/20 130/74    Physical Exam Vitals and nursing note reviewed.  Constitutional:      General: She is not in acute distress.    Appearance: Normal appearance. She is well-developed.  HENT:     Head: Normocephalic and atraumatic.  Cardiovascular:     Rate  and Rhythm: Normal rate. Rhythm irregular.     Pulses: Normal pulses.     Heart sounds: No murmur heard.   Pulmonary:     Effort: Pulmonary effort is normal. No respiratory distress.     Breath sounds: No wheezing or rhonchi.  Abdominal:     Palpations: Abdomen is soft.     Tenderness: There is no abdominal tenderness.  Musculoskeletal:     Cervical back: Normal range of motion and neck supple.     Right knee: Bony tenderness present. No effusion or erythema. Decreased range of motion.     Right lower leg: No edema.     Left lower leg: No edema.  Lymphadenopathy:     Cervical: No cervical adenopathy.  Skin:    General: Skin is warm and dry.     Capillary Refill: Capillary refill takes less than 2 seconds.     Findings: Bruising present. No rash.  Neurological:     General: No focal deficit present.     Mental Status: She is alert.  Psychiatric:        Mood and Affect: Mood and affect and mood normal.        Thought Content: Thought content normal.     Wt Readings from Last 3 Encounters:  01/06/21 122 lb (55.3 kg)  12/17/20 122 lb  5.7 oz (55.5 kg)  08/26/20 124 lb (56.2 kg)    BP 106/66   Pulse 67   Ht 5' (1.524 m)   Wt 122 lb (55.3 kg)   SpO2 97%   BMI 23.83 kg/m   Assessment and Plan: 1. Essential (primary) hypertension Clinically stable exam with well controlled BP. Tolerating medications without side effects at this time. Pt to continue current regimen and low sodium diet; benefits of regular exercise as able discussed.  2. Senile dementia, uncomplicated ([place]) Stable with supportive family  3. Persistent atrial fibrillation (HCC) Rate controlled; anticoagulated without bleeding concerns  4. Acquired thrombophilia ([place]) On Eliquis  5. Hyponatremia resolved  6. Carotid artery disease, unspecified laterality ([place]) Pt has consistently refused statin theray  7. Arteriosclerosis of coronary artery S/p CABG 2006 Followed by cardiology   Partially dictated using Dragon software. Any errors are unintentional.  Halina Maidens, MD [facility]  01/06/2021

## 2021-01-07 ENCOUNTER — Other Ambulatory Visit: Payer: Self-pay | Admitting: Internal Medicine

## 2021-01-17 ENCOUNTER — Other Ambulatory Visit: Payer: Self-pay | Admitting: Internal Medicine

## 2021-01-19 ENCOUNTER — Ambulatory Visit: Payer: Medicare Other | Admitting: Internal Medicine

## 2021-01-30 ENCOUNTER — Other Ambulatory Visit: Payer: Self-pay | Admitting: Internal Medicine

## 2021-02-05 ENCOUNTER — Ambulatory Visit: Payer: Self-pay

## 2021-02-05 NOTE — Telephone Encounter (Signed)
Please call pt to schedule appt for tomorrow same day appt is ok.  KP

## 2021-02-05 NOTE — Telephone Encounter (Signed)
   [place] Female, 87 y.o., 30-May-1925  MRN:  [id number] Phone:  [phone] Jerilynn Mages)       PCP:  Glean Hess, MD Coverage:  [facility] 06/17/2021 at 1:00 PM         Message from [place] sent at 02/05/2021 12:40 PM EST  Summary: advice   Pt is 87 years old and is worried because she hit her wrist and there is a lump on it. Pt wanted to know if this could also be due to the medication she is on. No appts with Dr Army Melia until 3/25, pt needs advise.          Call History   Type Contact Phone/Fax User  02/05/2021 12:38 PM EST Phone (Incoming) [place], [place] (Self) [phone] (H) Pawlus, Monica A   Pt. Reports this morning she noticed her left wrist has a lump on outer aspect, about the size of a nickle. Swelling noted around her wrist. No bruising. Feels "stiff when I try to move it." Denies any pain.Practice closed for lunch. Pt. Would like a virtual or an office visit.No availability this week. Please advise.  Reason for Disposition . [1] After 3 days AND [2] pain not improving  Answer Assessment - Initial Assessment Questions 1. MECHANISM: "How did the injury happen?"     Unsure 2. ONSET: "When did the injury happen?" (Minutes or hours ago)      This morning 3. APPEARANCE of INJURY: "What does the injury look like?"      Has lump on outer aspect 4. SEVERITY: "Can you use the hand normally?" "Can you bend your fingers into a ball and then fully open them?"     Stiff 5. SIZE: For cuts, bruises, or swelling, ask: "How large is it?" (e.g., inches or centimeters;  entire hand or wrist)      No 6. PAIN: "Is there pain?" If Yes, ask: "How bad is the pain?"  (Scale 1-10; or mild, moderate, severe)     No 7. TETANUS: For any breaks in the skin, ask: "When was the last tetanus booster?"     No 8. OTHER SYMPTOMS: "Do you have any other symptoms?"      No 9.  PREGNANCY: "Is there any chance you are pregnant?" "When was your last menstrual period?"     No  Protocols used: HAND AND WRIST INJURY-A-AH

## 2021-02-05 NOTE — Telephone Encounter (Signed)
We have a appt tomorrow at 8:20 available.  KP

## 2021-02-05 NOTE — Telephone Encounter (Signed)
Pt coming in tomorrow at 76

## 2021-02-06 ENCOUNTER — Other Ambulatory Visit: Payer: Self-pay

## 2021-02-06 ENCOUNTER — Ambulatory Visit (INDEPENDENT_AMBULATORY_CARE_PROVIDER_SITE_OTHER): Payer: Medicare Other | Admitting: Internal Medicine

## 2021-02-06 ENCOUNTER — Encounter: Payer: Self-pay | Admitting: Internal Medicine

## 2021-02-06 VITALS — BP 158/72 | HR 76 | Temp 98.6°F | Ht 60.0 in | Wt 126.0 lb

## 2021-02-06 DIAGNOSIS — M11242 Other chondrocalcinosis, left hand: Secondary | ICD-10-CM

## 2021-02-06 MED ORDER — COLCHICINE 0.6 MG PO TABS: 0.6000 mg | ORAL_TABLET | Freq: Two times a day (BID) | ORAL | 0 refills | Status: DC

## 2021-02-06 NOTE — Progress Notes (Signed)
Date:  02/06/2021   Name:  Donna Santos   DOB:  1925/03/29   MRN:  [id number]   Chief Complaint: Edema (X1 day, Left hand/ wrist, pt doesn't know how it happened, painful, throbbing  )  Hand Pain  There was no injury mechanism. The pain is present in the left hand. The quality of the pain is described as shooting. The pain does not radiate. The pain is moderate. The pain has been constant since the incident. Pertinent negatives include no chest pain or numbness. The symptoms are aggravated by movement and palpation. She has tried nothing for the symptoms.    Lab Results  Component Value Date   CREATININE 1.04 (H) 12/17/2020   BUN 15 12/17/2020   NA 137 12/17/2020   K 4.1 12/17/2020   CL 98 12/17/2020   CO2 27 12/17/2020   Lab Results  Component Value Date   CHOL 261 (A) 07/24/2013   HDL 44 07/24/2013   LDLCALC 179 07/24/2013   TRIG 192 (A) 07/24/2013   Lab Results  Component Value Date   TSH 3.300 05/02/2019   No results found for: HGBA1C Lab Results  Component Value Date   WBC 6.2 12/17/2020   HGB 13.7 12/17/2020   HCT 41.0 12/17/2020   MCV 92.8 12/17/2020   PLT 330 12/17/2020   Lab Results  Component Value Date   ALT 11 12/17/2020   AST 18 12/17/2020   ALKPHOS 36 (L) 12/17/2020   BILITOT 0.6 12/17/2020     Review of Systems  Constitutional: Negative for chills, fatigue and fever.  Respiratory: Negative for chest tightness and shortness of breath.   Cardiovascular: Negative for chest pain.  Musculoskeletal: Positive for arthralgias and joint swelling.  Neurological: Negative for numbness.    Patient Active Problem List   Diagnosis Date Noted  . Acquired thrombophilia ([place]) 09/25/2020  . Pseudogout of right knee 08/04/2020  . Aortic atherosclerosis ([place]) 07/10/2020  . Hyponatremia 06/16/2020  . Hypokalemia 12/17/2019  . Chest wall recurrence of right breast cancer ([place]) 11/16/2019  . Osteoporosis 06/17/2019  . Primary open angle glaucoma  (POAG) of left eye, moderate stage 04/25/2018  . Persistent atrial fibrillation ([place]) 12/28/2016  . Carcinoma of overlapping sites of right breast in female, estrogen receptor positive ([place]) 12/21/2016  . Senile dementia, uncomplicated ([place]) 50/53/9767  . Low back pain 04/12/2016  . Impaired renal function 05/09/2015  . Arteriosclerosis of coronary artery 05/09/2015  . Essential (primary) hypertension 05/09/2015  . Personal history of malignant neoplasm of breast 05/09/2015  . Muscle spasms of neck 05/09/2015  . Arthritis of shoulder region, degenerative 05/09/2015  . Hypercholesteremia 04/20/2012  . Carotid artery disease ([place]) 05/28/2005    Allergies  Allergen Reactions  . Ace Inhibitors     Other reaction(s): Angioedema  . Brinzolamide-Brimonidine     Past Surgical History:  Procedure Laterality Date  . CAROTID ENDARTERECTOMY    . CORONARY ARTERY BYPASS GRAFT  2006  . MASS EXCISION Right 08/28/2015   Procedure: EXCISION MASS; remove massive right chest wall;  Surgeon: Florene Glen, MD;  Location: [facility] ORS;  Service: General;  Laterality: Right;  . MASTECTOMY, RADICAL Right 2002   BREAST CA  . TOTAL ABDOMINAL HYSTERECTOMY      Social History   Tobacco Use  . Smoking status: Former Smoker    Packs/day: 1.50    Years: 1.50    Pack years: 2.25    Types: Cigarettes    Quit date: 10/04/1952  Years since quitting: 68.3  . Smokeless tobacco: Never Used  Vaping Use  . Vaping Use: Never used  Substance Use Topics  . Alcohol use: No    Alcohol/week: 0.0 standard drinks  . Drug use: No     Medication list has been reviewed and updated.  Current Meds  Medication Sig  . amLODipine (NORVASC) 5 MG tablet TAKE 1 TABLET(5 MG) BY MOUTH TWICE DAILY  . anastrozole (ARIMIDEX) 1 MG tablet TAKE 1 TABLET(1 MG) BY MOUTH DAILY  . aspirin EC 81 MG tablet Take 81 mg by mouth daily.  . Calcium Carb-Cholecalciferol 600-800 MG-UNIT TABS Take 1 tablet by mouth daily. Reported on  12/25/2015  . Cholecalciferol (VITAMIN D-3) 125 MCG (5000 UT) TABS Take 1 tablet by mouth daily.  . colchicine 0.6 MG tablet Take 1 tablet (0.6 mg total) by mouth daily as needed. Knee pain  . ELIQUIS 2.5 MG TABS tablet TAKE 1 TABLET(2.5 MG) BY MOUTH TWICE DAILY  . ibuprofen (ADVIL,MOTRIN) 200 MG tablet Take 200 mg by mouth every 6 (six) hours as needed.  . isosorbide mononitrate (IMDUR) 30 MG 24 hr tablet TAKE 1 TABLET(30 MG) BY MOUTH DAILY  . latanoprost (XALATAN) 0.005 % ophthalmic solution INT 1 GTT INTO EACH EYE QHS.  Marland Kitchen losartan (COZAAR) 25 MG tablet TAKE 1 TABLET(25 MG) BY MOUTH DAILY  . metoprolol succinate (TOPROL-XL) 50 MG 24 hr tablet TAKE 1 TABLET BY MOUTH EVERY NIGHT  . nitroGLYCERIN (NITROSTAT) 0.4 MG SL tablet Place 0.4 mg under the tongue every 5 (five) minutes as needed for chest pain.  . potassium chloride (KLOR-CON) 10 MEQ tablet Take 1 tablet (10 mEq total) by mouth once for 1 dose.  . timolol (TIMOPTIC) 0.25 % ophthalmic solution INSTILL ONE DROP INTO BOTH EYES EVERY MORNING.  . TURMERIC PO Take 1 tablet by mouth daily.    PHQ 2/9 Scores 02/06/2021 01/06/2021 06/30/2020 11/16/2019  PHQ - 2 Score 0 0 0 0  PHQ- 9 Score 0 0 - 0  Exception Documentation - - - -    GAD 7 : Generalized Anxiety Score 02/06/2021 01/06/2021  Nervous, Anxious, on Edge 0 0  Control/stop worrying 0 0  Worry too much - different things 0 0  Trouble relaxing 0 0  Restless 0 0  Easily annoyed or irritable 0 0  Afraid - awful might happen 0 0  Total GAD 7 Score 0 0  Anxiety Difficulty - Not difficult at all    BP Readings from Last 3 Encounters:  02/06/21 (!) 158/72  01/06/21 106/66  12/17/20 (!) 156/81    Physical Exam Vitals and nursing note reviewed.  Constitutional:      General: She is not in acute distress.    Appearance: She is well-developed.  HENT:     Head: Normocephalic and atraumatic.  Cardiovascular:     Rate and Rhythm: Normal rate and regular rhythm.  Pulmonary:     Effort:  Pulmonary effort is normal. No respiratory distress.  Musculoskeletal:     Left wrist: Swelling (and warmth) and tenderness present. Decreased range of motion.     Left hand: Swelling and tenderness (and warmth) present. Decreased range of motion.     Comments: Wedding ring on 4th finger very tight  Skin:    General: Skin is warm and dry.     Findings: No rash.  Neurological:     Mental Status: She is alert and oriented to person, place, and time.  Psychiatric:  Mood and Affect: Mood normal.        Behavior: Behavior normal.     Wt Readings from Last 3 Encounters:  02/06/21 126 lb (57.2 kg)  01/06/21 122 lb (55.3 kg)  12/17/20 122 lb 5.7 oz (55.5 kg)    BP (!) 158/72   Pulse 76   Temp 98.6 F (37 C) (Oral)   Ht 5' (1.524 m)   Wt 126 lb (57.2 kg)   SpO2 96%   BMI 24.61 kg/m   Assessment and Plan: 1. Pseudogout of hand, left Suspected pseudogout -  Elevate Have ring cut off today Call on Monday to report progress - colchicine 0.6 MG tablet; Take 1 tablet (0.6 mg total) by mouth 2 (two) times daily.  Dispense: 30 tablet; Refill: 0   Partially dictated using Editor, commissioning. Any errors are unintentional.  Halina Maidens, MD [facility]  02/06/2021

## 2021-02-06 NOTE — Patient Instructions (Signed)
Elevate hand to heart level as much as possible, including while in bed  Take Colchicine twice a day with food.  Call on Monday to report progress.

## 2021-02-09 ENCOUNTER — Telehealth: Payer: Self-pay

## 2021-02-09 NOTE — Telephone Encounter (Signed)
Spoke to Judson Roch let her know that Dr. Army Melia could not make that decision. She verbalized understanding.  KP

## 2021-02-09 NOTE — Telephone Encounter (Signed)
Spoke to Donna Santos let her know that Dr. Army Melia could not make that decision. She verbalized understanding.  KP

## 2021-02-09 NOTE — Telephone Encounter (Unsigned)
Copied from [place] [id number]. Topic: General - Inquiry >> Feb 09, 2021 10:00 AM Greggory Keen D wrote: Reason for CRM: Pt's daughter in law called to say pt wants to go home from their house and she is wanting to talk to Dr. Army Melia about it.  They are  thinking she is not readt to go home.   CB#  [phone]

## 2021-02-09 NOTE — Telephone Encounter (Signed)
Daughter in law has called back and states they need an answer asap by 4:20. Pt is wanting to leave Sara's home and they are wanting some feed back as soon as possible. Call Clarise Cruz back [phone]!

## 2021-02-19 ENCOUNTER — Other Ambulatory Visit: Payer: Self-pay | Admitting: Internal Medicine

## 2021-02-19 DIAGNOSIS — E876 Hypokalemia: Secondary | ICD-10-CM

## 2021-02-19 NOTE — Telephone Encounter (Signed)
Copied from [place] [id number]. Topic: Quick Communication - Rx Refill/Question >> Feb 19, 2021 12:37 PM Tessa Lerner A wrote: Medication: losartan (COZAAR) 25 MG tablet   Has the patient contacted their pharmacy? The pharmacy has contacted patient and directed them to contact their PCP  Preferred Pharmacy (with phone number or street name): [id number] - [place], [address] AT [place]  Phone:  [phone]  Agent: Please be advised that RX refills may take up to 3 business days. We ask that you follow-up with your pharmacy.

## 2021-02-19 NOTE — Telephone Encounter (Signed)
Requested Prescriptions  Pending Prescriptions Disp Refills  . potassium chloride (KLOR-CON) 10 MEQ tablet [Pharmacy Med Name: POTASSIUM CL MICRO 10MEQ ER TABS] 90 tablet 1    Sig: TAKE 1 TABLET(10 MEQ) BY MOUTH 1 TIME FOR 1 DOSE     Endocrinology:  Minerals - Potassium Supplementation Failed - 02/19/2021  3:40 AM      Failed - Cr in normal range and within 360 days    Creatinine, Ser  Date Value Ref Range Status  12/17/2020 1.04 (H) 0.44 - 1.00 mg/dL Final         Passed - K in normal range and within 360 days    Potassium  Date Value Ref Range Status  12/17/2020 4.1 3.5 - 5.1 mmol/L Final         Passed - Valid encounter within last 12 months    Recent Outpatient Visits          1 week ago Pseudogout of hand, left   [facility] Glean Hess, MD   1 month ago Essential (primary) hypertension   [facility] Glean Hess, MD   5 months ago Pseudogout of right knee   [facility] Glean Hess, MD   7 months ago Senile dementia, uncomplicated [facility])   [facility] Glean Hess, MD   1 year ago Essential (primary) hypertension   [facility] Glean Hess, MD      Future Appointments            In 4 months Army Melia Jesse Sans, MD [facility], [facility]

## 2021-02-23 ENCOUNTER — Other Ambulatory Visit: Payer: Self-pay

## 2021-02-23 MED ORDER — LOSARTAN POTASSIUM 25 MG PO TABS: ORAL_TABLET | ORAL | 5 refills | Status: DC

## 2021-03-13 DIAGNOSIS — E876 Hypokalemia: Secondary | ICD-10-CM | POA: Diagnosis not present

## 2021-03-21 ENCOUNTER — Other Ambulatory Visit: Payer: Self-pay | Admitting: Internal Medicine

## 2021-03-21 NOTE — Telephone Encounter (Signed)
Requested Prescriptions  Pending Prescriptions Disp Refills  . metoprolol succinate (TOPROL-XL) 50 MG 24 hr tablet [Pharmacy Med Name: METOPROLOL ER SUCCINATE 50MG  TABS] 90 tablet 1    Sig: TAKE 1 TABLET BY MOUTH EVERY NIGHT     Cardiovascular:  Beta Blockers Failed - 03/21/2021 12:58 PM      Failed - Last BP in normal range    BP Readings from Last 1 Encounters:  02/06/21 (!) 158/72         Passed - Last Heart Rate in normal range    Pulse Readings from Last 1 Encounters:  02/06/21 76         Passed - Valid encounter within last 6 months    Recent Outpatient Visits          1 month ago Pseudogout of hand, left   [facility] Glean Hess, MD   2 months ago Essential (primary) hypertension   [facility] Glean Hess, MD   6 months ago Pseudogout of right knee   [facility] Glean Hess, MD   8 months ago Senile dementia, uncomplicated [facility])   [facility] Glean Hess, MD   1 year ago Essential (primary) hypertension   [facility] Glean Hess, MD      Future Appointments            In 3 months Army Melia Jesse Sans, MD [facility], [facility]

## 2021-03-25 DIAGNOSIS — E876 Hypokalemia: Secondary | ICD-10-CM | POA: Diagnosis not present

## 2021-03-25 DIAGNOSIS — H0102A Squamous blepharitis right eye, upper and lower eyelids: Secondary | ICD-10-CM | POA: Diagnosis not present

## 2021-03-25 DIAGNOSIS — H0102B Squamous blepharitis left eye, upper and lower eyelids: Secondary | ICD-10-CM | POA: Diagnosis not present

## 2021-03-25 DIAGNOSIS — Z961 Presence of intraocular lens: Secondary | ICD-10-CM | POA: Diagnosis not present

## 2021-03-25 DIAGNOSIS — H04123 Dry eye syndrome of bilateral lacrimal glands: Secondary | ICD-10-CM | POA: Diagnosis not present

## 2021-03-25 DIAGNOSIS — H401132 Primary open-angle glaucoma, bilateral, moderate stage: Secondary | ICD-10-CM | POA: Diagnosis not present

## 2021-04-01 ENCOUNTER — Other Ambulatory Visit: Payer: Self-pay

## 2021-04-01 MED ORDER — ANASTROZOLE 1 MG PO TABS: ORAL_TABLET | ORAL | 1 refills | Status: DC

## 2021-04-14 ENCOUNTER — Other Ambulatory Visit: Payer: Self-pay | Admitting: Internal Medicine

## 2021-04-14 DIAGNOSIS — I2510 Atherosclerotic heart disease of native coronary artery without angina pectoris: Secondary | ICD-10-CM

## 2021-05-18 DIAGNOSIS — B351 Tinea unguium: Secondary | ICD-10-CM | POA: Diagnosis not present

## 2021-05-18 DIAGNOSIS — M79674 Pain in right toe(s): Secondary | ICD-10-CM | POA: Diagnosis not present

## 2021-05-18 DIAGNOSIS — I87303 Chronic venous hypertension (idiopathic) without complications of bilateral lower extremity: Secondary | ICD-10-CM | POA: Diagnosis not present

## 2021-05-18 DIAGNOSIS — M79675 Pain in left toe(s): Secondary | ICD-10-CM | POA: Diagnosis not present

## 2021-05-19 ENCOUNTER — Other Ambulatory Visit: Payer: Self-pay | Admitting: Internal Medicine

## 2021-05-19 DIAGNOSIS — E876 Hypokalemia: Secondary | ICD-10-CM

## 2021-05-28 ENCOUNTER — Other Ambulatory Visit: Payer: Self-pay | Admitting: Internal Medicine

## 2021-06-14 ENCOUNTER — Other Ambulatory Visit: Payer: Self-pay | Admitting: *Deleted

## 2021-06-14 DIAGNOSIS — C50911 Malignant neoplasm of unspecified site of right female breast: Secondary | ICD-10-CM

## 2021-06-17 ENCOUNTER — Other Ambulatory Visit: Payer: Self-pay

## 2021-06-17 ENCOUNTER — Inpatient Hospital Stay: Payer: Medicare Other | Attending: Oncology

## 2021-06-17 ENCOUNTER — Inpatient Hospital Stay (HOSPITAL_BASED_OUTPATIENT_CLINIC_OR_DEPARTMENT_OTHER): Payer: Medicare Other | Admitting: Oncology

## 2021-06-17 VITALS — BP 154/64 | HR 64 | Temp 96.8°F | Resp 18 | Wt 127.0 lb

## 2021-06-17 DIAGNOSIS — Z853 Personal history of malignant neoplasm of breast: Secondary | ICD-10-CM

## 2021-06-17 DIAGNOSIS — Z86000 Personal history of in-situ neoplasm of breast: Secondary | ICD-10-CM | POA: Insufficient documentation

## 2021-06-17 DIAGNOSIS — C50911 Malignant neoplasm of unspecified site of right female breast: Secondary | ICD-10-CM | POA: Diagnosis not present

## 2021-06-17 DIAGNOSIS — Z08 Encounter for follow-up examination after completed treatment for malignant neoplasm: Secondary | ICD-10-CM | POA: Diagnosis not present

## 2021-06-17 DIAGNOSIS — Z9011 Acquired absence of right breast and nipple: Secondary | ICD-10-CM | POA: Diagnosis not present

## 2021-06-17 DIAGNOSIS — M810 Age-related osteoporosis without current pathological fracture: Secondary | ICD-10-CM | POA: Insufficient documentation

## 2021-06-17 LAB — COMPREHENSIVE METABOLIC PANEL
ALT: 14 U/L (ref 0–44)
AST: 16 U/L (ref 15–41)
Albumin: 3.8 g/dL (ref 3.5–5.0)
Alkaline Phosphatase: 38 U/L (ref 38–126)
Anion gap: 9 (ref 5–15)
BUN: 16 mg/dL (ref 8–23)
CO2: 27 mmol/L (ref 22–32)
Calcium: 9.1 mg/dL (ref 8.9–10.3)
Chloride: 100 mmol/L (ref 98–111)
Creatinine, Ser: 0.83 mg/dL (ref 0.44–1.00)
GFR, Estimated: >60 mL/min (ref >60)
Glucose, Bld: 126 mg/dL — ABNORMAL HIGH (ref 70–99)
Potassium: 3.5 mmol/L (ref 3.5–5.1)
Sodium: 136 mmol/L (ref 135–145)
Total Bilirubin: 0.6 mg/dL (ref 0.3–1.2)
Total Protein: 7.1 g/dL (ref 6.5–8.1)

## 2021-06-17 LAB — CBC WITH DIFFERENTIAL/PLATELET
Abs Immature Granulocytes: 0.04 10*3/uL (ref 0.00–0.07)
Basophils Absolute: 0.1 10*3/uL (ref 0.0–0.1)
Basophils Relative: 1 %
Eosinophils Absolute: 0.3 10*3/uL (ref 0.0–0.5)
Eosinophils Relative: 4 %
HCT: 38.6 % (ref 36.0–46.0)
Hemoglobin: 13 g/dL (ref 12.0–15.0)
Immature Granulocytes: 1 %
Lymphocytes Relative: 11 %
Lymphs Abs: 0.9 10*3/uL (ref 0.7–4.0)
MCH: 31.7 pg (ref 26.0–34.0)
MCHC: 33.7 g/dL (ref 30.0–36.0)
MCV: 94.1 fL (ref 80.0–100.0)
Monocytes Absolute: 0.9 10*3/uL (ref 0.1–1.0)
Monocytes Relative: 12 %
Neutro Abs: 5.6 10*3/uL (ref 1.7–7.7)
Neutrophils Relative %: 71 %
Platelets: 322 10*3/uL (ref 150–400)
RBC: 4.1 MIL/uL (ref 3.87–5.11)
RDW: 13.2 % (ref 11.5–15.5)
WBC: 7.8 10*3/uL (ref 4.0–10.5)
nRBC: 0 % (ref 0.0–0.2)

## 2021-06-17 NOTE — Progress Notes (Signed)
Hematology/Oncology Consult note [facility]  Telephone:([phone] [phone]  Patient Care Team: Reubin Milan, MD as PCP - General (Internal Medicine) Carmina Miller, MD as Referring Physician (Radiation Oncology) Rosey Bath, MD as Referring Physician (Hematology and Oncology)   Name of the patient: Donna Santos  [id number]  1925-04-18   Date of visit: 06/17/21  Diagnosis-history of recurrent right breast cancer  Chief complaint/ Reason for visit-routine follow-up of breast cancer  Heme/Onc history: Patient is a 87 year old female who initially had right breast low-grade DCIS in 2002.Pathology revealed a 1.5 cm area of DCIS with 5% early invasion.  Tumor was ER+ 74%, PR -, and HER-2/neu -.   She underwent a right mastectomy and axillary lymph node dissection followed by reconstruction.  Pathology showed no residual carcinoma.  She did not receive tamoxifen.  She then developed right chest wall recurrence in 2016 s/p excision.  Pathology showed 1.5 cm invasive mammary carcinoma low-grade consistent with invasive cribriform carcinoma of mammary origin.  Tumor was 90% ER/PR positive and HER2 negative.  She received radiation treatment and presently on Arimidex.  Patient has baseline osteoporosis  Interval history-patient is here with her daughter-in-law today.  Reports tolerating Arimidex well without any significant side effects.  She is also taking her calcium and vitamin D.  ECOG PS- 3 Pain scale- 0   Review of systems- Review of Systems  Constitutional:  Positive for malaise/fatigue. Negative for chills, fever and weight loss.  HENT:  Negative for congestion, ear discharge and nosebleeds.   Eyes:  Negative for blurred vision.  Respiratory:  Negative for cough, hemoptysis, sputum production, shortness of breath and wheezing.   Cardiovascular:  Negative for chest pain, palpitations, orthopnea and claudication.   Gastrointestinal:  Negative for abdominal pain, blood in stool, constipation, diarrhea, heartburn, melena, nausea and vomiting.  Genitourinary:  Negative for dysuria, flank pain, frequency, hematuria and urgency.  Musculoskeletal:  Negative for back pain, joint pain and myalgias.  Skin:  Negative for rash.  Neurological:  Negative for dizziness, tingling, focal weakness, seizures, weakness and headaches.  Endo/Heme/Allergies:  Does not bruise/bleed easily.  Psychiatric/Behavioral:  Negative for depression and suicidal ideas. The patient does not have insomnia.      Allergies  Allergen Reactions   Ace Inhibitors     Other reaction(s): Angioedema   Brinzolamide-Brimonidine      Past Medical History:  Diagnosis Date   Arteriosclerosis of coronary artery    Arthritis of shoulder region    Breast cancer (HCC) 2002   RT MASTECTOMY   Cataract    Essential hypertension    Glaucoma    Hypercholesteremia    Impaired renal function    Muscle spasms of neck    Tobacco abuse      Past Surgical History:  Procedure Laterality Date   CAROTID ENDARTERECTOMY     CORONARY ARTERY BYPASS GRAFT  2006   MASS EXCISION Right 08/28/2015   Procedure: EXCISION MASS; remove massive right chest wall;  Surgeon: Lattie Haw, MD;  Location: [facility] ORS;  Service: General;  Laterality: Right;   MASTECTOMY, RADICAL Right 2002   BREAST CA   TOTAL ABDOMINAL HYSTERECTOMY      Social History   Socioeconomic History   Marital status: Widowed    Spouse name: Not on file   Number of children: 6   Years of education: Not on file   Highest education level: Not on file  Occupational History   Not on  file  Tobacco Use   Smoking status: Former    Packs/day: 1.50    Years: 1.50    Pack years: 2.25    Types: Cigarettes    Quit date: 10/04/1952    Years since quitting: 68.7   Smokeless tobacco: Never  Vaping Use   Vaping Use: Never used  Substance and Sexual Activity   Alcohol use: No     Alcohol/week: 0.0 standard drinks   Drug use: No   Sexual activity: Not on file  Other Topics Concern   Not on file  Social History Narrative   Not on file   Social Determinants of Health   Financial Resource Strain: Low Risk    Difficulty of Paying Living Expenses: Not hard at all  Food Insecurity: No Food Insecurity   Worried About Charity fundraiser in the Last Year: Never true   [place] in the Last Year: Never true  Transportation Needs: No Transportation Needs   Lack of Transportation (Medical): No   Lack of Transportation (Non-Medical): No  Physical Activity: Inactive   Days of Exercise per Week: 0 days   Minutes of Exercise per Session: 0 min  Stress: No Stress Concern Present   Feeling of Stress : Not at all  Social Connections: Moderately Isolated   Frequency of Communication with Friends and Family: More than three times a week   Frequency of Social Gatherings with Friends and Family: Three times a week   Attends Religious Services: More than 4 times per year   Active Member of Clubs or Organizations: No   Attends Archivist Meetings: Never   Marital Status: Widowed  Human resources officer Violence: Not At Risk   Fear of Current or Ex-Partner: No   Emotionally Abused: No   Physically Abused: No   Sexually Abused: No    Family History  Problem Relation Age of Onset   Hypertension Father    Cancer Father    Breast cancer Mother      Current Outpatient Medications:    amLODipine (NORVASC) 5 MG tablet, TAKE 1 TABLET(5 MG) BY MOUTH TWICE DAILY, Disp: 180 tablet, Rfl: 0   anastrozole (ARIMIDEX) 1 MG tablet, TAKE 1 TABLET(1 MG) BY MOUTH DAILY, Disp: 90 tablet, Rfl: 1   aspirin EC 81 MG tablet, Take 81 mg by mouth daily., Disp: , Rfl:    Calcium Carb-Cholecalciferol 600-800 MG-UNIT TABS, Take 1 tablet by mouth daily. Reported on 12/25/2015, Disp: , Rfl:    Cholecalciferol (VITAMIN D-3) 125 MCG (5000 UT) TABS, Take 1 tablet by mouth daily., Disp: , Rfl:     colchicine 0.6 MG tablet, Take 1 tablet (0.6 mg total) by mouth 2 (two) times daily., Disp: 30 tablet, Rfl: 0   ELIQUIS 2.5 MG TABS tablet, TAKE 1 TABLET(2.5 MG) BY MOUTH TWICE DAILY, Disp: 180 tablet, Rfl: 1   ibuprofen (ADVIL,MOTRIN) 200 MG tablet, Take 200 mg by mouth every 6 (six) hours as needed., Disp: , Rfl:    isosorbide mononitrate (IMDUR) 30 MG 24 hr tablet, TAKE 1 TABLET(30 MG) BY MOUTH DAILY, Disp: 90 tablet, Rfl: 3   latanoprost (XALATAN) 0.005 % ophthalmic solution, INT 1 GTT INTO EACH EYE QHS., Disp: , Rfl: 6   losartan (COZAAR) 25 MG tablet, TAKE 1 TABLET(25 MG) BY MOUTH DAILY, Disp: 30 tablet, Rfl: 5   metoprolol succinate (TOPROL-XL) 50 MG 24 hr tablet, TAKE 1 TABLET BY MOUTH EVERY NIGHT, Disp: 90 tablet, Rfl: 1   nitroGLYCERIN (NITROSTAT) 0.4  MG SL tablet, Place 0.4 mg under the tongue every 5 (five) minutes as needed for chest pain., Disp: , Rfl:    potassium chloride (KLOR-CON) 10 MEQ tablet, TAKE 1 TABLET(10 MEQ) BY MOUTH 1 TIME FOR 1 DOSE, Disp: 90 tablet, Rfl: 1   timolol (TIMOPTIC) 0.25 % ophthalmic solution, INSTILL ONE DROP INTO BOTH EYES EVERY MORNING., Disp: , Rfl: 6   TURMERIC PO, Take 1 tablet by mouth daily., Disp: , Rfl:   Physical exam:  Vitals:   06/17/21 1321  BP: (!) 154/64  Pulse: 64  Resp: 18  Temp: (!) 96.8 F (36 C)  SpO2: 100%  Weight: 127 lb (57.6 kg)   Physical Exam Constitutional:      Comments: Frail elderly woman sitting in a wheelchair.  Appears in no acute distress  HENT:     Head: Normocephalic.  Cardiovascular:     Rate and Rhythm: Normal rate and regular rhythm.     Heart sounds: Normal heart sounds.  Pulmonary:     Effort: Pulmonary effort is normal.     Breath sounds: Normal breath sounds.  Abdominal:     General: Bowel sounds are normal.     Palpations: Abdomen is soft.  Skin:    General: Skin is warm and dry.  Neurological:     Mental Status: She is alert and oriented to person, place, and time.  Patient breast exam:  Patient is s/p right mastectomy with a well-healed surgical scar and no evidence of chest wall recurrence.  No palpable right axillary adenopathy.  No palpable masses in the left breast or left axillary adenopathy.  CMP Latest Ref Rng & Units 12/17/2020  Glucose 70 - 99 mg/dL 125(H)  BUN 8 - 23 mg/dL 15  Creatinine 0.44 - 1.00 mg/dL 1.04(H)  Sodium 135 - 145 mmol/L 137  Potassium 3.5 - 5.1 mmol/L 4.1  Chloride 98 - 111 mmol/L 98  CO2 22 - 32 mmol/L 27  Calcium 8.9 - 10.3 mg/dL 9.7  Total Protein 6.5 - 8.1 g/dL 7.7  Total Bilirubin 0.3 - 1.2 mg/dL 0.6  Alkaline Phos 38 - 126 U/L 36(L)  AST 15 - 41 U/L 18  ALT 0 - 44 U/L 11   CBC Latest Ref Rng & Units 12/17/2020  WBC 4.0 - 10.5 K/uL 6.2  Hemoglobin 12.0 - 15.0 g/dL 13.7  Hematocrit 36.0 - 46.0 % 41.0  Platelets 150 - 400 K/uL 330     Assessment and plan- Patient is a 87 y.o. female with history of right breast DCIS in 2002 followed by chest wall recurrence in 2016 s/p excision and adjuvant radiation treatment and currently on Arimidex  Patient has completed 5 years of Arimidex.  She is elderly with baseline osteoporosis and Arimidex can potentially make it worse.Her last bone density scan in August 2020 showed T score of -3.1 at the right femur.  I would like to repeat her bone density scan at this time and see if her osteoporosis is worse.  We could potentially consider stopping her Arimidex if that is the case to avoid further potential toxicity of Arimidex versus continuing hormone therapy beyond 5 years at her age.  I will see her back in 2 months time after her bone density scan is done   Visit Diagnosis 1. Encounter for follow-up surveillance of breast cancer   2. Osteoporosis of femur without pathological fracture      Dr. Randa Evens, MD, MPH [facility] at [facility] [phone] 06/17/2021 1:06  PM

## 2021-06-18 ENCOUNTER — Other Ambulatory Visit: Payer: Self-pay | Admitting: Internal Medicine

## 2021-06-18 LAB — CANCER ANTIGEN 27.29: CA 27.29: 11.4 U/mL (ref 0.0–38.6)

## 2021-06-21 ENCOUNTER — Other Ambulatory Visit: Payer: Self-pay | Admitting: Internal Medicine

## 2021-06-21 DIAGNOSIS — M11242 Other chondrocalcinosis, left hand: Secondary | ICD-10-CM

## 2021-06-21 NOTE — Telephone Encounter (Signed)
Requested medication (s) are due for refill today: yes  Requested medication (s) are on the active medication list: yes  Last refill:  02/06/21 #30  Future visit scheduled: yes  Notes to clinic:  overdue lab work- last uric acid 06/04/2019   Requested Prescriptions  Pending Prescriptions Disp Refills   colchicine 0.6 MG tablet [Pharmacy Med Name: COLCHICINE 0.'6MG'$  TABLETS] 30 tablet 0    Sig: TAKE 1 TABLET(0.6 MG) BY MOUTH DAILY AS NEEDED FOR KNEE PAIN      Endocrinology:  Gout Agents Failed - 06/21/2021  2:40 PM      Failed - Uric Acid in normal range and within 360 days    Uric Acid  Date Value Ref Range Status  06/04/2019 5.1 2.5 - 7.1 mg/dL Final    Comment:               Therapeutic target for gout patients: <6.0          Passed - Cr in normal range and within 360 days    Creatinine, Ser  Date Value Ref Range Status  06/17/2021 0.83 0.44 - 1.00 mg/dL Final          Passed - Valid encounter within last 12 months    Recent Outpatient Visits           4 months ago Pseudogout of hand, left   [facility] Glean Hess, MD   5 months ago Essential (primary) hypertension   [facility] Glean Hess, MD   9 months ago Pseudogout of right knee   [facility] Glean Hess, MD   11 months ago Senile dementia, uncomplicated [facility])   [place], Laura H, MD   1 year ago Essential (primary) hypertension   [facility] Glean Hess, MD       Future Appointments             In 1 week Army Melia Jesse Sans, MD [facility], [place]   In 2 months Sindy Guadeloupe, MD [place]

## 2021-06-22 ENCOUNTER — Other Ambulatory Visit: Payer: Self-pay | Admitting: Internal Medicine

## 2021-06-22 DIAGNOSIS — M11242 Other chondrocalcinosis, left hand: Secondary | ICD-10-CM

## 2021-06-22 NOTE — Telephone Encounter (Signed)
   Notes to clinic:  Patient requests 90 days supply   Requested Prescriptions  Pending Prescriptions Disp Refills   colchicine 0.6 MG tablet [Pharmacy Med Name: COLCHICINE 0.'6MG'$  TABLETS] 90 tablet     Sig: TAKE 1 TABLET(0.6 MG) BY MOUTH DAILY AS NEEDED FOR KNEE PAIN      Endocrinology:  Gout Agents Failed - 06/22/2021  9:36 AM      Failed - Uric Acid in normal range and within 360 days    Uric Acid  Date Value Ref Range Status  06/04/2019 5.1 2.5 - 7.1 mg/dL Final    Comment:               Therapeutic target for gout patients: <6.0          Passed - Cr in normal range and within 360 days    Creatinine, Ser  Date Value Ref Range Status  06/17/2021 0.83 0.44 - 1.00 mg/dL Final          Passed - Valid encounter within last 12 months    Recent Outpatient Visits           4 months ago Pseudogout of hand, left   [facility] Glean Hess, MD   5 months ago Essential (primary) hypertension   [facility] Glean Hess, MD   10 months ago Pseudogout of right knee   [facility] Glean Hess, MD   11 months ago Senile dementia, uncomplicated [facility])   [place], Laura H, MD   1 year ago Essential (primary) hypertension   [facility] Glean Hess, MD       Future Appointments             In 1 week Army Melia Jesse Sans, MD [facility], [place]   In 2 months Sindy Guadeloupe, MD [place]

## 2021-06-24 ENCOUNTER — Ambulatory Visit: Payer: Medicare Other | Admitting: Internal Medicine

## 2021-06-25 IMAGING — MG DIGITAL SCREENING UNILAT LEFT W/ TOMO W/ CAD
8 series · 8 of 24 positions shown · non-contrast
Comparison: Previous exam(s).

CLINICAL DATA: Screening.

EXAM:
DIGITAL SCREENING UNILATERAL LEFT MAMMOGRAM WITH CAD AND TOMO

[L MLO synth-2D (1 of 3)]
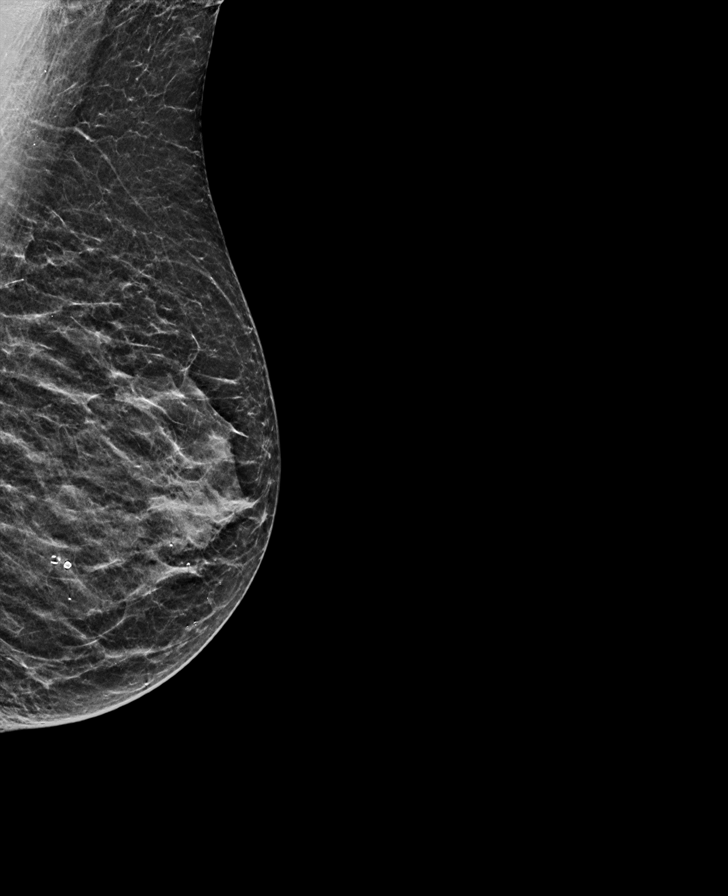

[L CC synth-2D]
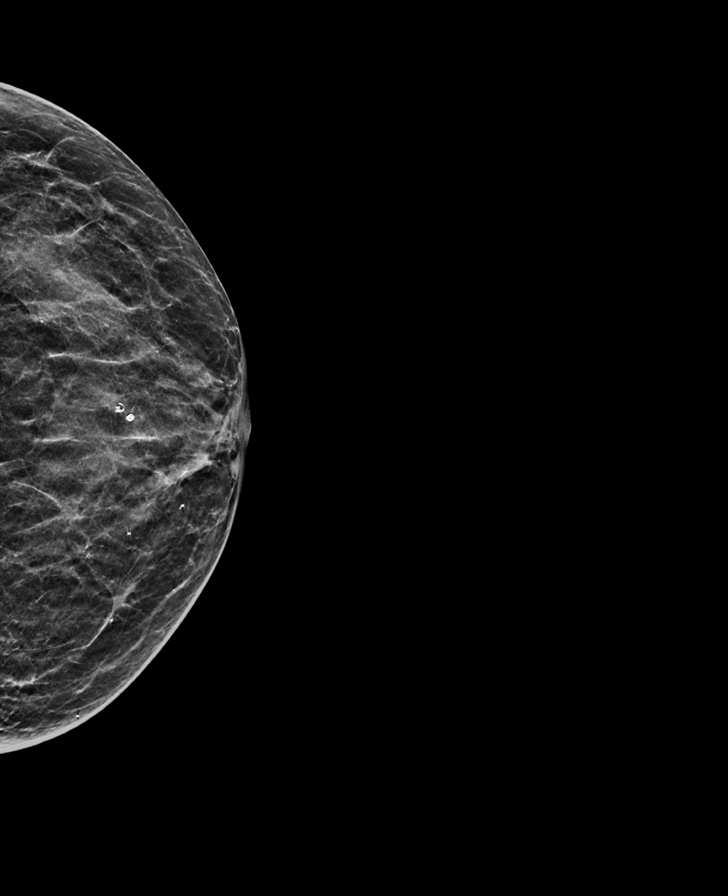

[L MLO synth-2D (2 of 3)]
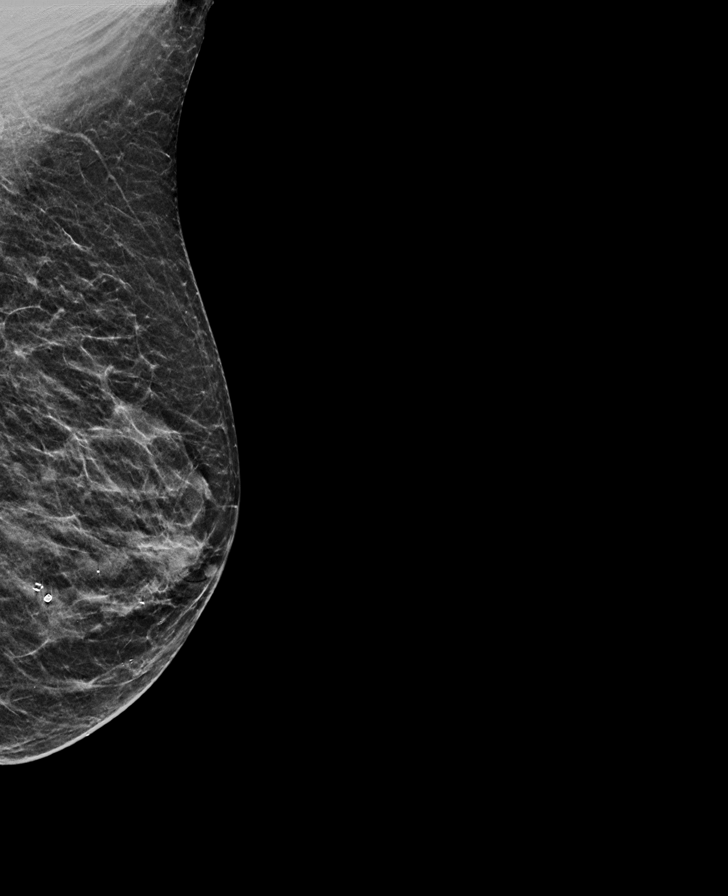

[L MLO synth-2D (3 of 3)]
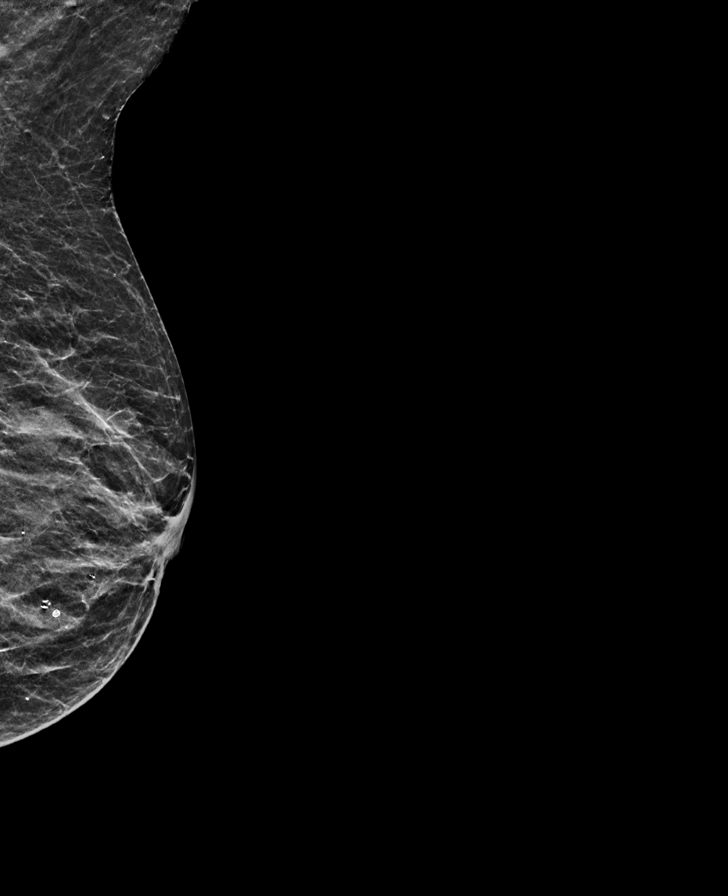

[L MLO tomo (1 of 3) · tomo slice 19/37.0]
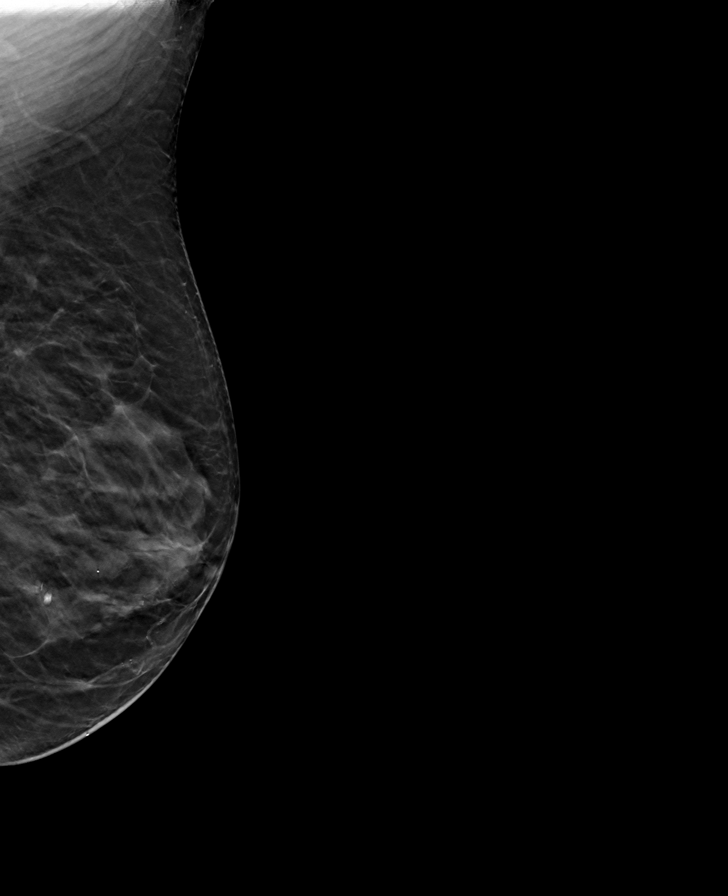

[L MLO tomo (2 of 3) · tomo slice 19/38.0]
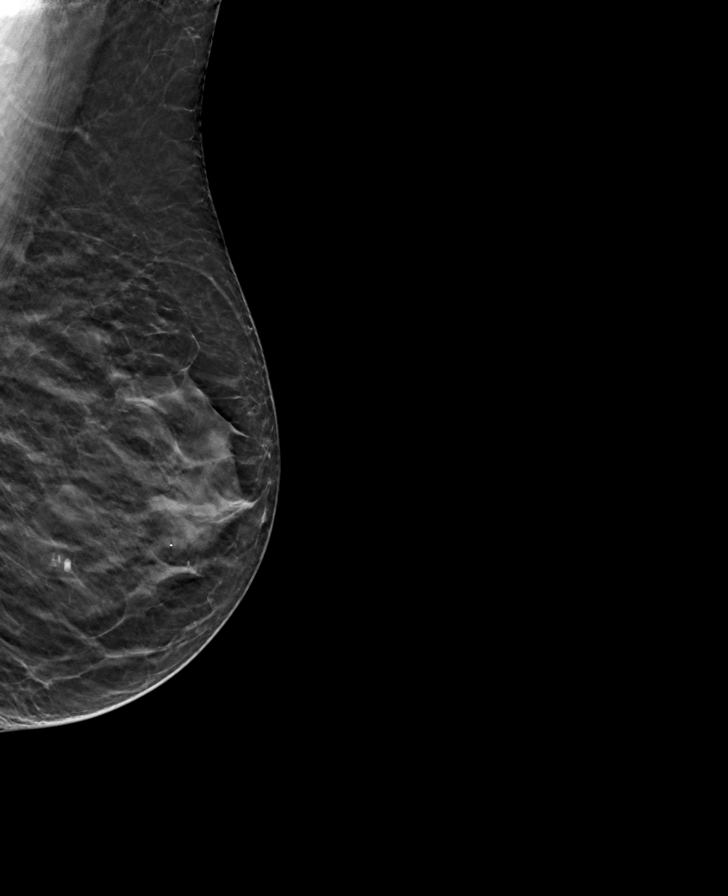

[L MLO tomo (3 of 3) · tomo slice 19/36.0]
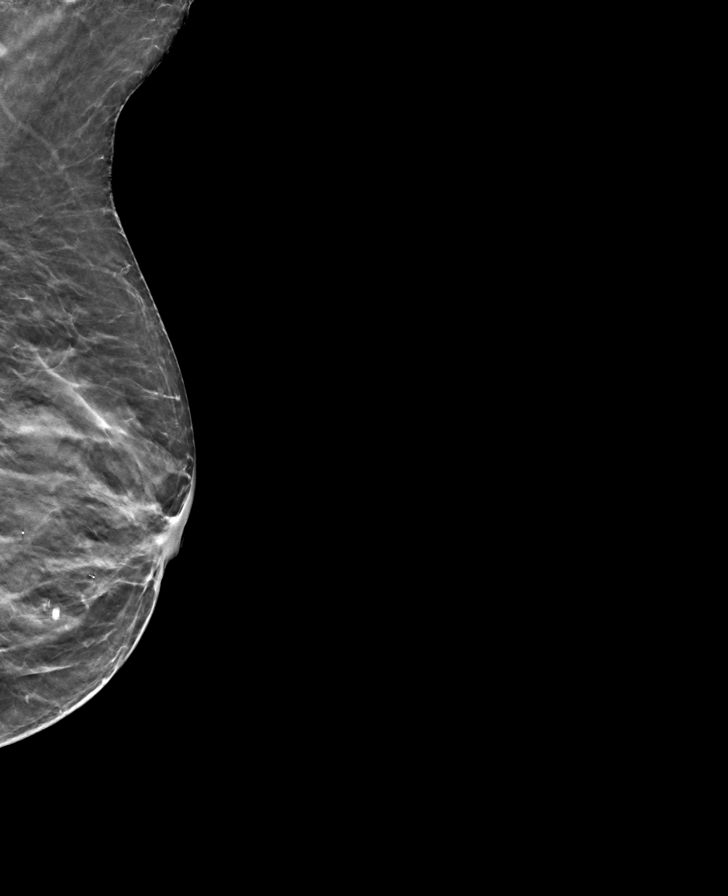

[L CC tomo · tomo slice 17/34.0]
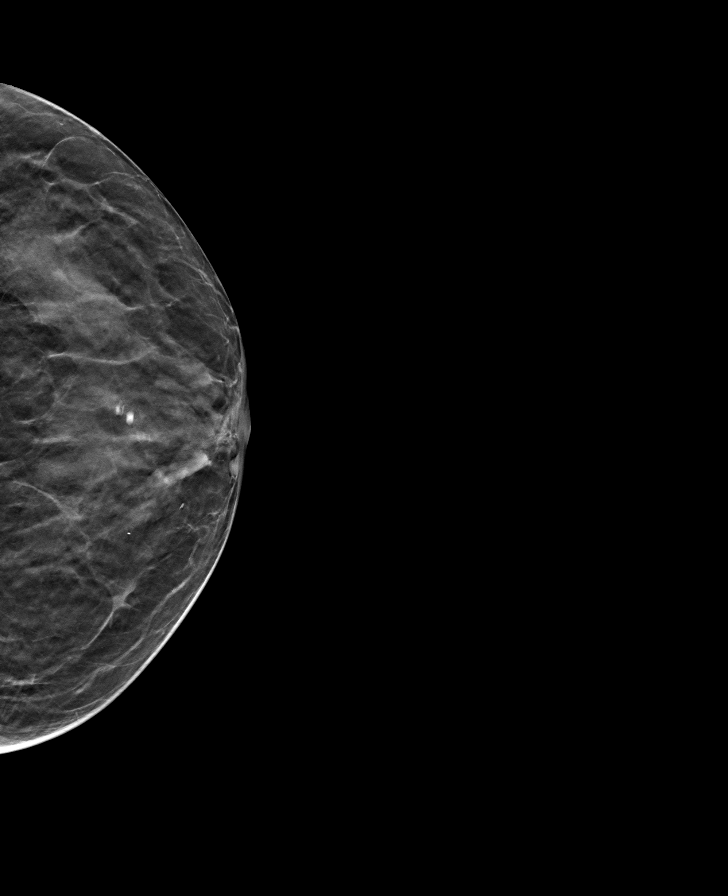

[8 of 24 positions shown; findings below may reference images not displayed]

ACR Breast Density Category c: The breast tissue is heterogeneously
dense, which may obscure small masses.
FINDINGS: The patient has had a right mastectomy. There are no findings
suspicious for malignancy.

Images were processed with CAD.
IMPRESSION: No mammographic evidence of malignancy. A result letter of this
screening mammogram will be mailed directly to the patient.

RECOMMENDATION:
Screening mammogram in one year.  (Code:SI-6-R6I)

BI-RADS CATEGORY  1: Negative.

## 2021-06-29 ENCOUNTER — Other Ambulatory Visit: Payer: Self-pay

## 2021-06-29 ENCOUNTER — Ambulatory Visit (INDEPENDENT_AMBULATORY_CARE_PROVIDER_SITE_OTHER): Payer: Medicare Other | Admitting: Internal Medicine

## 2021-06-29 ENCOUNTER — Encounter: Payer: Self-pay | Admitting: Internal Medicine

## 2021-06-29 VITALS — BP 140/84 | HR 70 | Temp 98.2°F | Ht 60.0 in | Wt 121.0 lb

## 2021-06-29 DIAGNOSIS — M11261 Other chondrocalcinosis, right knee: Secondary | ICD-10-CM | POA: Diagnosis not present

## 2021-06-29 DIAGNOSIS — I10 Essential (primary) hypertension: Secondary | ICD-10-CM

## 2021-06-29 DIAGNOSIS — I4819 Other persistent atrial fibrillation: Secondary | ICD-10-CM

## 2021-06-29 NOTE — Patient Instructions (Signed)
Colchicine should only be given twice a day as needed for about one week.  Then a break is treatment is recommended.

## 2021-06-29 NOTE — Progress Notes (Signed)
Date:  06/29/2021   Name:  Donna Santos   DOB:  13-May-1925   MRN:  [id number]   Chief Complaint: Hypertension (Around 123456 systolic when checking at home )  Hypertension This is a chronic problem. The problem is controlled (systolic at home [id number]). Associated symptoms include shortness of breath. Pertinent negatives include no chest pain, headaches or palpitations. Past treatments include angiotensin blockers, calcium channel blockers and beta blockers (diuretic stopped due to electrolyte imbalance). The current treatment provides moderate improvement.  Knee Pain  There was no injury mechanism. The pain is present in the right knee. The quality of the pain is described as aching. The pain has been Fluctuating since onset. The symptoms are aggravated by movement, palpation and weight bearing. Treatments tried: dx'd with pseudogout - taking colchicine bid but not clear if it is continuous or intermittent since son lays out her meds every Saturday.  Afib - no dizziness or syncope.  No chest pain or change in SOB.  She is on metoprolol for rate control and Eliquis.  Lab Results  Component Value Date   CREATININE 0.83 06/17/2021   BUN 16 06/17/2021   NA 136 06/17/2021   K 3.5 06/17/2021   CL 100 06/17/2021   CO2 27 06/17/2021   Lab Results  Component Value Date   CHOL 261 (A) 07/24/2013   HDL 44 07/24/2013   LDLCALC 179 07/24/2013   TRIG 192 (A) 07/24/2013   Lab Results  Component Value Date   TSH 3.300 05/02/2019   No results found for: HGBA1C Lab Results  Component Value Date   WBC 7.8 06/17/2021   HGB 13.0 06/17/2021   HCT 38.6 06/17/2021   MCV 94.1 06/17/2021   PLT 322 06/17/2021   Lab Results  Component Value Date   ALT 14 06/17/2021   AST 16 06/17/2021   ALKPHOS 38 06/17/2021   BILITOT 0.6 06/17/2021     Review of Systems  Constitutional:  Negative for chills, fatigue and unexpected weight change.  HENT:  Negative for nosebleeds.   Eyes:  Negative for  visual disturbance.  Respiratory:  Positive for shortness of breath. Negative for cough, chest tightness and wheezing.   Cardiovascular:  Positive for leg swelling (mild). Negative for chest pain and palpitations.  Gastrointestinal:  Negative for abdominal pain, constipation and diarrhea.  Musculoskeletal:  Positive for arthralgias and myalgias.  Neurological:  Negative for dizziness, weakness, light-headedness and headaches.  Psychiatric/Behavioral:  Negative for sleep disturbance.    Patient Active Problem List   Diagnosis Date Noted   Acquired thrombophilia ([place]) 09/25/2020   Pseudogout of right knee 08/04/2020   Aortic atherosclerosis ([place]) 07/10/2020   Hyponatremia 06/16/2020   Hypokalemia 12/17/2019   Chest wall recurrence of right breast cancer ([place]) 11/16/2019   Osteoporosis 06/17/2019   Primary open angle glaucoma (POAG) of left eye, moderate stage 04/25/2018   Persistent atrial fibrillation ([place]) 12/28/2016   Carcinoma of overlapping sites of right breast in female, estrogen receptor positive ([place]) 12/21/2016   Senile dementia, uncomplicated ([place]) 0000000   Low back pain 04/12/2016   Impaired renal function 05/09/2015   Arteriosclerosis of coronary artery 05/09/2015   Essential (primary) hypertension 05/09/2015   Personal history of malignant neoplasm of breast 05/09/2015   Muscle spasms of neck 05/09/2015   Arthritis of shoulder region, degenerative 05/09/2015   Hypercholesteremia 04/20/2012   Carotid artery disease ([place]) 05/28/2005    Allergies  Allergen Reactions   Ace Inhibitors     Other  reaction(s): Angioedema   Brinzolamide-Brimonidine     Past Surgical History:  Procedure Laterality Date   CAROTID ENDARTERECTOMY     CORONARY ARTERY BYPASS GRAFT  2006   MASS EXCISION Right 08/28/2015   Procedure: EXCISION MASS; remove massive right chest wall;  Surgeon: Florene Glen, MD;  Location: [facility] ORS;  Service: General;  Laterality: Right;   MASTECTOMY,  RADICAL Right 2002   BREAST CA   TOTAL ABDOMINAL HYSTERECTOMY      Social History   Tobacco Use   Smoking status: Former    Packs/day: 1.50    Years: 1.50    Pack years: 2.25    Types: Cigarettes    Quit date: 10/04/1952    Years since quitting: 68.7   Smokeless tobacco: Never  Vaping Use   Vaping Use: Never used  Substance Use Topics   Alcohol use: No    Alcohol/week: 0.0 standard drinks   Drug use: No     Medication list has been reviewed and updated.  Current Meds  Medication Sig   amLODipine (NORVASC) 5 MG tablet TAKE 1 TABLET(5 MG) BY MOUTH TWICE DAILY   anastrozole (ARIMIDEX) 1 MG tablet TAKE 1 TABLET(1 MG) BY MOUTH DAILY   aspirin EC 81 MG tablet Take 81 mg by mouth daily.   Calcium Carb-Cholecalciferol 600-800 MG-UNIT TABS Take 1 tablet by mouth daily. Reported on 12/25/2015   Cholecalciferol (VITAMIN D-3) 125 MCG (5000 UT) TABS Take 1 tablet by mouth daily.   colchicine 0.6 MG tablet TAKE 1 TABLET(0.6 MG) BY MOUTH DAILY AS NEEDED FOR KNEE PAIN   ELIQUIS 2.5 MG TABS tablet TAKE 1 TABLET(2.5 MG) BY MOUTH TWICE DAILY   ibuprofen (ADVIL,MOTRIN) 200 MG tablet Take 200 mg by mouth every 6 (six) hours as needed.   isosorbide mononitrate (IMDUR) 30 MG 24 hr tablet TAKE 1 TABLET(30 MG) BY MOUTH DAILY   latanoprost (XALATAN) 0.005 % ophthalmic solution INT 1 GTT INTO EACH EYE QHS.   losartan (COZAAR) 25 MG tablet TAKE 1 TABLET(25 MG) BY MOUTH DAILY   metoprolol succinate (TOPROL-XL) 50 MG 24 hr tablet TAKE 1 TABLET BY MOUTH EVERY NIGHT   nitroGLYCERIN (NITROSTAT) 0.4 MG SL tablet Place 0.4 mg under the tongue every 5 (five) minutes as needed for chest pain.   potassium chloride (KLOR-CON) 10 MEQ tablet TAKE 1 TABLET(10 MEQ) BY MOUTH 1 TIME FOR 1 DOSE   timolol (TIMOPTIC) 0.25 % ophthalmic solution INSTILL ONE DROP INTO BOTH EYES EVERY MORNING.   TURMERIC PO Take 1 tablet by mouth daily.    PHQ 2/9 Scores 06/29/2021 02/06/2021 01/06/2021 06/30/2020  PHQ - 2 Score 0 0 0 0  PHQ- 9  Score 4 0 0 -  Exception Documentation - - - -    GAD 7 : Generalized Anxiety Score 06/29/2021 02/06/2021 01/06/2021  Nervous, Anxious, on Edge 0 0 0  Control/stop worrying 1 0 0  Worry too much - different things 0 0 0  Trouble relaxing 0 0 0  Restless 0 0 0  Easily annoyed or irritable 0 0 0  Afraid - awful might happen 0 0 0  Total GAD 7 Score 1 0 0  Anxiety Difficulty - - Not difficult at all    BP Readings from Last 3 Encounters:  06/29/21 140/84  06/17/21 (!) 154/64  02/06/21 (!) 158/72    Physical Exam Vitals and nursing note reviewed.  Constitutional:      General: She is not in acute distress.    Appearance:  She is well-developed.  HENT:     Head: Normocephalic and atraumatic.  Pulmonary:     Effort: Pulmonary effort is normal. No respiratory distress.  Musculoskeletal:     Right knee: Swelling (bony without redness/heat) present.  Skin:    General: Skin is warm and dry.     Findings: No rash.  Neurological:     Mental Status: She is alert and oriented to person, place, and time.  Psychiatric:        Mood and Affect: Mood normal.        Behavior: Behavior normal.    Wt Readings from Last 3 Encounters:  06/29/21 121 lb (54.9 kg)  06/17/21 127 lb (57.6 kg)  02/06/21 126 lb (57.2 kg)    BP 140/84   Pulse 70   Temp 98.2 F (36.8 C) (Oral)   Ht 5' (1.524 m)   Wt 121 lb (54.9 kg)   SpO2 95%   BMI 23.63 kg/m   Assessment and Plan: 1. Essential (primary) hypertension Clinically stable exam with well controlled BP. Tolerating medications without side effects at this time. Pt to continue current regimen and low sodium diet  2. Persistent atrial fibrillation (HCC) On metoprolol and Eliquis No bleeding issues noted  3. Pseudogout of right knee Recommend interval treatment as needed with colchicine and avoid continuous dosing Last renal panel was normal.   Partially dictated using Editor, commissioning. Any errors are unintentional.  Halina Maidens,  MD [facility]  06/29/2021

## 2021-06-30 ENCOUNTER — Telehealth: Payer: Self-pay

## 2021-06-30 NOTE — Telephone Encounter (Signed)
Copied from [place] [id number]. Topic: General - Other >> Jun 29, 2021  3:11 PM Celene Kras wrote: Reason for CRM: Pts son, calling on behalf of pt, he states that the pt has been given colchicine to take for a week. He states that the pt was supposed to be taking 2, but she has only been using 1 a day. He is requesting to have advice from PCP if they should extend the time she is taking the medication or wait a week and come back to it. Please advise.

## 2021-07-01 NOTE — Telephone Encounter (Signed)
Called and left detailed message - informed of Dr. Gaspar Cola note.

## 2021-07-03 ENCOUNTER — Telehealth: Payer: Self-pay | Admitting: Internal Medicine

## 2021-07-03 NOTE — Telephone Encounter (Signed)
Copied from [place] [id number]. Topic: Medicare AWV >> Jul 03, 2021 10:12 AM Cher Nakai R wrote: Reason for CRM:  No answer unable to leave a message for patient to call back and schedule Medicare Annual Wellness Visit (AWV) in office.   If unable to come into the office for AWV,  please offer to do virtually or by telephone.  Last AWV:  06/30/2020  Please schedule at anytime with [facility] Health Advisor.  40 minute appointment  Any questions, please contact me at [phone]

## 2021-07-15 ENCOUNTER — Other Ambulatory Visit: Payer: Self-pay | Admitting: Internal Medicine

## 2021-07-15 NOTE — Telephone Encounter (Signed)
   Notes to clinic: ZERO refills remain on this prescription. Your patient is requesting advance approval of refills for this medication to [facility]   Requested Prescriptions  Pending Prescriptions Disp Refills   amLODipine (NORVASC) 5 MG tablet [Pharmacy Med Name: AMLODIPINE BESYLATE '5MG'$  TABLETS] 180 tablet 0    Sig: TAKE 1 TABLET(5 MG) BY MOUTH TWICE DAILY     Cardiovascular:  Calcium Channel Blockers Failed - 07/15/2021  8:00 AM      Failed - Last BP in normal range    BP Readings from Last 1 Encounters:  06/29/21 140/84          Passed - Valid encounter within last 6 months    Recent Outpatient Visits           2 weeks ago Essential (primary) hypertension   [facility] Glean Hess, MD   5 months ago Pseudogout of hand, left   [facility] Glean Hess, MD   6 months ago Essential (primary) hypertension   [facility] Glean Hess, MD   10 months ago Pseudogout of right knee   [facility] Glean Hess, MD   1 year ago Senile dementia, uncomplicated [facility])   [facility] Glean Hess, MD       Future Appointments             In 1 month Sindy Guadeloupe, MD [place]

## 2021-07-29 NOTE — Telephone Encounter (Signed)
complete

## 2021-08-19 ENCOUNTER — Other Ambulatory Visit: Payer: Self-pay

## 2021-08-19 ENCOUNTER — Ambulatory Visit
Admission: RE | Admit: 2021-08-19 | Discharge: 2021-08-19 | Disposition: A | Discharge status: Home / Self Care | Payer: Medicare Other | Source: Ambulatory Visit | Attending: Oncology | Admitting: Oncology

## 2021-08-19 DIAGNOSIS — Z780 Asymptomatic menopausal state: Secondary | ICD-10-CM | POA: Insufficient documentation

## 2021-08-19 DIAGNOSIS — Z853 Personal history of malignant neoplasm of breast: Secondary | ICD-10-CM

## 2021-08-19 DIAGNOSIS — Z13820 Encounter for screening for osteoporosis: Secondary | ICD-10-CM | POA: Insufficient documentation

## 2021-08-19 DIAGNOSIS — Z08 Encounter for follow-up examination after completed treatment for malignant neoplasm: Secondary | ICD-10-CM

## 2021-08-19 DIAGNOSIS — M810 Age-related osteoporosis without current pathological fracture: Secondary | ICD-10-CM | POA: Diagnosis not present

## 2021-08-19 DIAGNOSIS — Z1231 Encounter for screening mammogram for malignant neoplasm of breast: Secondary | ICD-10-CM | POA: Diagnosis not present

## 2021-08-21 ENCOUNTER — Other Ambulatory Visit: Payer: Self-pay | Admitting: Internal Medicine

## 2021-08-26 ENCOUNTER — Other Ambulatory Visit: Payer: Self-pay

## 2021-08-26 ENCOUNTER — Inpatient Hospital Stay: Payer: Medicare Other | Attending: Oncology | Admitting: Oncology

## 2021-08-26 DIAGNOSIS — Z5181 Encounter for therapeutic drug level monitoring: Secondary | ICD-10-CM | POA: Diagnosis not present

## 2021-08-26 DIAGNOSIS — Z79811 Long term (current) use of aromatase inhibitors: Secondary | ICD-10-CM | POA: Diagnosis not present

## 2021-08-26 DIAGNOSIS — M810 Age-related osteoporosis without current pathological fracture: Secondary | ICD-10-CM

## 2021-08-26 NOTE — Progress Notes (Signed)
I connected with Donna Santos on 08/26/21 at  1:00 PM EDT by video enabled telemedicine visit and verified that I am speaking with the correct person using two identifiers.   I discussed the limitations, risks, security and privacy concerns of performing an evaluation and management service by telemedicine and the availability of in-person appointments. I also discussed with the patient that there may be a patient responsible charge related to this service. The patient expressed understanding and agreed to proceed.  Other persons participating in the visit and their role in the encounter: Patient's son and daughter-in-law  Patient's location:  home Provider's location:  work  Risk analyst Complaint: Discuss bone density scan results and further management  History of present illness: Patient is a 87 year old female who initially had right breast low-grade DCIS in 2002.Pathology revealed a 1.5 cm area of DCIS with 5% early invasion.  Tumor was ER+ 74%, PR -, and HER-2/neu -.   She underwent a right mastectomy and axillary lymph node dissection followed by reconstruction.  Pathology showed no residual carcinoma.  She did not receive tamoxifen.   She then developed right chest wall recurrence in 2016 s/p excision.  Pathology showed 1.5 cm invasive mammary carcinoma low-grade consistent with invasive cribriform carcinoma of mammary origin.  Tumor was 90% ER/PR positive and HER2 negative.  She received radiation treatment and presently on Arimidex.  Patient has baseline osteoporosis  Interval history patient is doing well at home and has not had any recent falls or hospitalizations.  She is taking calcium vitamin D supplements once a day   Review of Systems  Constitutional:  Positive for malaise/fatigue. Negative for chills, fever and weight loss.  HENT:  Negative for congestion, ear discharge and nosebleeds.   Eyes:  Negative for blurred vision.  Respiratory:  Negative for cough, hemoptysis, sputum  production, shortness of breath and wheezing.   Cardiovascular:  Negative for chest pain, palpitations, orthopnea and claudication.  Gastrointestinal:  Negative for abdominal pain, blood in stool, constipation, diarrhea, heartburn, melena, nausea and vomiting.  Genitourinary:  Negative for dysuria, flank pain, frequency, hematuria and urgency.  Musculoskeletal:  Negative for back pain, joint pain and myalgias.  Skin:  Negative for rash.  Neurological:  Negative for dizziness, tingling, focal weakness, seizures, weakness and headaches.  Endo/Heme/Allergies:  Does not bruise/bleed easily.  Psychiatric/Behavioral:  Negative for depression and suicidal ideas. The patient does not have insomnia.    Allergies  Allergen Reactions   Ace Inhibitors     Other reaction(s): Angioedema   Brinzolamide-Brimonidine     Past Medical History:  Diagnosis Date   Arteriosclerosis of coronary artery    Arthritis of shoulder region    Breast cancer ([place]) 2002   RT MASTECTOMY   Cataract    Essential hypertension    Glaucoma    Hypercholesteremia    Impaired renal function    Muscle spasms of neck    Tobacco abuse     Past Surgical History:  Procedure Laterality Date   CAROTID ENDARTERECTOMY     CORONARY ARTERY BYPASS GRAFT  2006   MASS EXCISION Right 08/28/2015   Procedure: EXCISION MASS; remove massive right chest wall;  Surgeon: Florene Glen, MD;  Location: [facility] ORS;  Service: General;  Laterality: Right;   MASTECTOMY, RADICAL Right 2002   BREAST CA   TOTAL ABDOMINAL HYSTERECTOMY      Social History   Socioeconomic History   Marital status: Widowed    Spouse name: Not on file   Number  of children: 6   Years of education: Not on file   Highest education level: Not on file  Occupational History   Not on file  Tobacco Use   Smoking status: Former    Packs/day: 1.50    Years: 1.50    Pack years: 2.25    Types: Cigarettes    Quit date: 10/04/1952    Years since quitting: 68.9    Smokeless tobacco: Never  Vaping Use   Vaping Use: Never used  Substance and Sexual Activity   Alcohol use: No    Alcohol/week: 0.0 standard drinks   Drug use: No   Sexual activity: Not on file  Other Topics Concern   Not on file  Social History Narrative   Not on file   Social Determinants of Health   Financial Resource Strain: Not on file  Food Insecurity: Not on file  Transportation Needs: Not on file  Physical Activity: Not on file  Stress: Not on file  Social Connections: Not on file  Intimate Partner Violence: Not on file    Family History  Problem Relation Age of Onset   Hypertension Father    Cancer Father    Breast cancer Mother      Current Outpatient Medications:    amLODipine (NORVASC) 5 MG tablet, TAKE 1 TABLET(5 MG) BY MOUTH TWICE DAILY, Disp: 180 tablet, Rfl: 0   anastrozole (ARIMIDEX) 1 MG tablet, TAKE 1 TABLET(1 MG) BY MOUTH DAILY, Disp: 90 tablet, Rfl: 1   aspirin EC 81 MG tablet, Take 81 mg by mouth daily., Disp: , Rfl:    Calcium Carb-Cholecalciferol 600-800 MG-UNIT TABS, Take 1 tablet by mouth daily. Reported on 12/25/2015, Disp: , Rfl:    Cholecalciferol (VITAMIN D-3) 125 MCG (5000 UT) TABS, Take 1 tablet by mouth daily., Disp: , Rfl:    colchicine 0.6 MG tablet, TAKE 1 TABLET(0.6 MG) BY MOUTH DAILY AS NEEDED FOR KNEE PAIN, Disp: 90 tablet, Rfl: 1   ELIQUIS 2.5 MG TABS tablet, TAKE 1 TABLET(2.5 MG) BY MOUTH TWICE DAILY, Disp: 180 tablet, Rfl: 1   ibuprofen (ADVIL,MOTRIN) 200 MG tablet, Take 200 mg by mouth every 6 (six) hours as needed., Disp: , Rfl:    isosorbide mononitrate (IMDUR) 30 MG 24 hr tablet, TAKE 1 TABLET(30 MG) BY MOUTH DAILY, Disp: 90 tablet, Rfl: 3   latanoprost (XALATAN) 0.005 % ophthalmic solution, INT 1 GTT INTO EACH EYE QHS., Disp: , Rfl: 6   losartan (COZAAR) 25 MG tablet, TAKE 1 TABLET(25 MG) BY MOUTH DAILY, Disp: 30 tablet, Rfl: 5   metoprolol succinate (TOPROL-XL) 50 MG 24 hr tablet, TAKE 1 TABLET BY MOUTH EVERY NIGHT, Disp: 90  tablet, Rfl: 1   potassium chloride (KLOR-CON) 10 MEQ tablet, TAKE 1 TABLET(10 MEQ) BY MOUTH 1 TIME FOR 1 DOSE, Disp: 90 tablet, Rfl: 1   timolol (TIMOPTIC) 0.25 % ophthalmic solution, INSTILL ONE DROP INTO BOTH EYES EVERY MORNING., Disp: , Rfl: 6   TURMERIC PO, Take 1 tablet by mouth daily., Disp: , Rfl:    nitroGLYCERIN (NITROSTAT) 0.4 MG SL tablet, Place 0.4 mg under the tongue every 5 (five) minutes as needed for chest pain. (Patient not taking: Reported on 08/26/2021), Disp: , Rfl:   DG Bone Density  Result Date: 08/19/2021 EXAM: DUAL X-RAY ABSORPTIOMETRY (DXA) FOR BONE MINERAL DENSITY IMPRESSION: Your patient Donna Santos completed a BMD test on 08/19/2021 using the [employer] (software version: 14.10) manufactured by [employer]. The following summarizes the results of  our evaluation. Technologist: MTB PATIENT BIOGRAPHICAL: Name: Donna, Santos Patient ID: [id number] Birth Date: 04/26/1925 Height: 60.0 in. Gender: Female Exam Date: 08/19/2021 Weight: 121.0 lbs. Indications: Postmenopausal, High Risk Meds, Height Loss, History of Breast Cancer, Caucasian, Hysterectomy, Oophorectomy Bilateral, Advanced Age Fractures: Treatments: Anastrozole DENSITOMETRY RESULTS: Site      Region      Measured Date Measured Age WHO Classification Young Adult T-score BMD         %Change vs. Previous Significant Change (*) DualFemur Total Right 10/19/[age].5 Osteoporosis -4.0 0.498 g/cm2 -19.4% Yes DualFemur Total Right 07/25/2019 92.5 Osteoporosis -3.1 0.618 g/cm2 -8.6% Yes DualFemur Total Right 12/06/2016 89.8 Osteoporosis -2.6 0.676 g/cm2 - - DualFemur Total Mean 08/19/2021 94.5 Osteoporosis -3.8 0.532 g/cm2 -16.6% Yes DualFemur Total Mean 07/25/2019 92.5 Osteoporosis -2.9 0.638 g/cm2 -9.4% Yes DualFemur Total Mean 12/06/2016 89.8 Osteopenia -2.4 0.704 g/cm2 - - ASSESSMENT: The BMD measured at Femur Total Right is 0.498 g/cm2 with a T-score of -4.0. This patient is considered osteoporotic  according to [facility]) criteria. The scan quality is good. Lumbar spine was not utilized due to advanced degenerative changes. Forearm was not utilized due to No Reference Data for Left forearm. Reference Population did not support Left foream for densitimetry. World Pharmacologist [facility]) criteria for post-menopausal, Caucasian Women: Normal:                   T-score at or above -1 SD Osteopenia/low bone mass: T-score between -1 and -2.5 SD Osteoporosis:             T-score at or below -2.5 SD RECOMMENDATIONS: 1. All patients should optimize calcium and vitamin D intake. 2. Consider FDA-approved medical therapies in postmenopausal women and men aged 21 years and older, based on the following: a. A hip or vertebral(clinical or morphometric) fracture b. T-score < -2.5 at the femoral neck or spine after appropriate evaluation to exclude secondary causes c. Low bone mass (T-score between -1.0 and -2.5 at the femoral neck or spine) and a 10-year probability of a hip fracture > 3% or a 10-year probability of a major osteoporosis-related fracture > 20% based on the [place]-adapted WHO algorithm 3. Clinician judgment and/or patient preferences may indicate treatment for people with 10-year fracture probabilities above or below these levels FOLLOW-UP: People with diagnosed cases of osteoporosis or at high risk for fracture should have regular bone mineral density tests. For patients eligible for Medicare, routine testing is allowed once every 2 years. The testing frequency can be increased to one year for patients who have rapidly progressing disease, those who are receiving or discontinuing medical therapy to restore bone mass, or have additional risk factors. I have reviewed this report, and agree with the above findings. [facility] Electronically Signed   By: Rolm Baptise M.D.   On: 08/19/2021 13:24   MM 3D SCREEN BREAST UNI LEFT  Result Date: 08/25/2021 CLINICAL DATA:  Screening.  EXAM: DIGITAL SCREENING UNILATERAL LEFT MAMMOGRAM WITH CAD AND TOMOSYNTHESIS TECHNIQUE: Left screening digital craniocaudal and mediolateral oblique mammograms were obtained. Left screening digital breast tomosynthesis was performed. The images were evaluated with computer-aided detection. COMPARISON:  Previous exam(s). ACR Breast Density Category c: The breast tissue is heterogeneously dense, which may obscure small masses. FINDINGS: There are no findings suspicious for malignancy. IMPRESSION: No mammographic evidence of malignancy. A result letter of this screening mammogram will be mailed directly to the patient. RECOMMENDATION: Screening mammogram in one year. (Code:SM-B-01Y) BI-RADS CATEGORY  1:  Negative. Electronically Signed   By: Lajean Manes M.D.   On: 08/25/2021 12:43   No images are attached to the encounter.   CMP Latest Ref Rng & Units 06/17/2021  Glucose 70 - 99 mg/dL 126(H)  BUN 8 - 23 mg/dL 16  Creatinine 0.44 - 1.00 mg/dL 0.83  Sodium 135 - 145 mmol/L 136  Potassium 3.5 - 5.1 mmol/L 3.5  Chloride 98 - 111 mmol/L 100  CO2 22 - 32 mmol/L 27  Calcium 8.9 - 10.3 mg/dL 9.1  Total Protein 6.5 - 8.1 g/dL 7.1  Total Bilirubin 0.3 - 1.2 mg/dL 0.6  Alkaline Phos 38 - 126 U/L 38  AST 15 - 41 U/L 16  ALT 0 - 44 U/L 14   CBC Latest Ref Rng & Units 06/17/2021  WBC 4.0 - 10.5 K/uL 7.8  Hemoglobin 12.0 - 15.0 g/dL 13.0  Hematocrit 36.0 - 46.0 % 38.6  Platelets 150 - 400 K/uL 322     Observation/objective: Appears in no acute distress over video visit today.  Breathing is nonlabored  Assessment and plan: Patient is a 87 year old female with history of right breast DCIS in 2002 followed by chest wall recurrence in 2016 s/p excision adjuvant radiation treatment and currently on Arimidex.  This is a visit to discuss bone density scan results and further management  Patient is now more than 5 years out of her chest wall recurrence for breast cancer.  She is still taking Arimidex.Her repeat  bone density scan shows worsening osteoporosis especially in her right femur when the scores have consistently decreased from -2.6 and 2018 to -3.01 in 2022-4 presently.  I would therefore like her to hold off on taking any further endocrine therapy at this time.  She is 60 and at this time I am more concerned about her immediate risk of osteoporosis associated fractures from inadvertent falls.  Her family verbalized understanding and will stop giving her Arimidex.  She will take calcium and vitamin D however twice a day.  We discussed further management of osteoporosis including watchful monitoring without medications versus weekly Fosamax versus yearly Reclast.  Discussed risks and benefits of Fosamax including all but not limited to possible risk of reflux disease.  Discussed risks of Reclast including all but not limited to possible risk of osteonecrosis of the jaw.  Patient's family would like to discuss this amongst themselves and then decide if they would like her to get any treatment for osteoporosis or continue watchful monitoring we also discussed foregoing any further mammograms at this point given her age.  Her recent mammogram from September 2022 did not reveal any evidence of malignancy.  Follow-up instructions: I will see her back in 6 months for a breast exam  I discussed the assessment and treatment plan with the patient. The patient was provided an opportunity to ask questions and all were answered. The patient agreed with the plan and demonstrated an understanding of the instructions.   The patient was advised to call back or seek an in-person evaluation if the symptoms worsen or if the condition fails to improve as anticipated.   Visit Diagnosis: 1. Visit for monitoring Arimidex therapy   2. Osteoporosis without current pathological fracture, unspecified osteoporosis type     Dr. Randa Evens, MD, MPH [facility] Tel- [phone] 08/26/2021 3:02  PM

## 2021-09-22 DIAGNOSIS — H401132 Primary open-angle glaucoma, bilateral, moderate stage: Secondary | ICD-10-CM | POA: Diagnosis not present

## 2021-09-22 DIAGNOSIS — H04123 Dry eye syndrome of bilateral lacrimal glands: Secondary | ICD-10-CM | POA: Diagnosis not present

## 2021-10-16 ENCOUNTER — Other Ambulatory Visit: Payer: Self-pay | Admitting: Internal Medicine

## 2021-10-16 NOTE — Telephone Encounter (Signed)
Requested Prescriptions  Pending Prescriptions Disp Refills  . amLODipine (NORVASC) 5 MG tablet [Pharmacy Med Name: AMLODIPINE BESYLATE 5MG  TABLETS] 180 tablet 0    Sig: TAKE 1 TABLET(5 MG) BY MOUTH TWICE DAILY     Cardiovascular:  Calcium Channel Blockers Failed - 10/16/2021  8:00 AM      Failed - Last BP in normal range    BP Readings from Last 1 Encounters:  06/29/21 140/84         Passed - Valid encounter within last 6 months    Recent Outpatient Visits          3 months ago Essential (primary) hypertension   [facility] Glean Hess, MD   8 months ago Pseudogout of hand, left   [facility] Glean Hess, MD   9 months ago Essential (primary) hypertension   [facility] Glean Hess, [facility]   1 year ago Pseudogout of right knee   [facility] Glean Hess, MD   1 year ago Senile dementia, uncomplicated [facility])   [facility] Glean Hess, MD

## 2021-11-22 ENCOUNTER — Other Ambulatory Visit: Payer: Self-pay | Admitting: Internal Medicine

## 2021-11-22 DIAGNOSIS — E876 Hypokalemia: Secondary | ICD-10-CM

## 2021-11-24 NOTE — Telephone Encounter (Signed)
Requested medication (s) are due for refill today:   No  Requested medication (s) are on the active medication list:   Yes  Future visit scheduled:   No   Last ordered: 05/19/2021 #90, 1 refill  Returned because no refills remain on this rx.   Requested Prescriptions  Pending Prescriptions Disp Refills   potassium chloride (KLOR-CON M) 10 MEQ tablet [Pharmacy Med Name: POTASSIUM CL MICRO 10MEQ ER TABS] 90 tablet 1    Sig: TAKE 1 TABLET(10 MEQ) BY MOUTH 1 TIME FOR 1 DOSE     Endocrinology:  Minerals - Potassium Supplementation Passed - 11/22/2021  8:00 AM      Passed - K in normal range and within 360 days    Potassium  Date Value Ref Range Status  06/17/2021 3.5 3.5 - 5.1 mmol/L Final          Passed - Cr in normal range and within 360 days    Creatinine, Ser  Date Value Ref Range Status  06/17/2021 0.83 0.44 - 1.00 mg/dL Final          Passed - Valid encounter within last 12 months    Recent Outpatient Visits           4 months ago Essential (primary) hypertension   [facility] Glean Hess, MD   9 months ago Pseudogout of hand, left   [facility] Glean Hess, MD   10 months ago Essential (primary) hypertension   [facility] Glean Hess, MD   1 year ago Pseudogout of right knee   [facility] Glean Hess, MD   1 year ago Senile dementia, uncomplicated [facility])   [facility] Glean Hess, MD

## 2021-12-18 ENCOUNTER — Other Ambulatory Visit: Payer: Self-pay | Admitting: Internal Medicine

## 2021-12-18 NOTE — Telephone Encounter (Signed)
Requested Prescriptions  Pending Prescriptions Disp Refills   metoprolol succinate (TOPROL-XL) 50 MG 24 hr tablet [Pharmacy Med Name: METOPROLOL ER SUCCINATE 50MG  TABS] 90 tablet 0    Sig: TAKE 1 TABLET BY MOUTH EVERY NIGHT     Cardiovascular:  Beta Blockers Failed - 12/18/2021  8:00 AM      Failed - Last BP in normal range    BP Readings from Last 1 Encounters:  06/29/21 140/84         Passed - Last Heart Rate in normal range    Pulse Readings from Last 1 Encounters:  06/29/21 70         Passed - Valid encounter within last 6 months    Recent Outpatient Visits          5 months ago Essential (primary) hypertension   [facility] Glean Hess, MD   10 months ago Pseudogout of hand, left   [facility] Glean Hess, MD   11 months ago Essential (primary) hypertension   [facility] Glean Hess, MD   1 year ago Pseudogout of right knee   [facility] Glean Hess, MD   1 year ago Senile dementia, uncomplicated [facility])   [facility] Glean Hess, MD

## 2022-01-06 ENCOUNTER — Ambulatory Visit (INDEPENDENT_AMBULATORY_CARE_PROVIDER_SITE_OTHER): Payer: Medicare Other

## 2022-01-06 DIAGNOSIS — Z0000 Encounter for general adult medical examination without abnormal findings: Secondary | ICD-10-CM | POA: Diagnosis not present

## 2022-01-06 NOTE — Progress Notes (Signed)
Subjective:   Donna Santos is a 88 y.o. female who presents for Medicare Annual (Subsequent) preventive examination.  Virtual Visit via Telephone Note  I connected with  [name] on 01/06/22 at  2:40 PM EST by telephone and verified that I am speaking with the correct person using two identifiers.  Location: Patient: home Provider: [facility] Persons participating in the virtual visit: patient & son [employer]   I discussed the limitations, risks, security and privacy concerns of performing an evaluation and management service by telephone and the availability of in person appointments. The patient expressed understanding and agreed to proceed.  Interactive audio and video telecommunications were attempted between this nurse and patient, however failed, due to patient having technical difficulties OR patient did not have access to video capability.  We continued and completed visit with audio only.  Some vital signs may be absent or patient reported.   Clemetine Marker, LPN   Review of Systems     Cardiac Risk Factors include: advanced age (>46men, >72 women);hypertension;sedentary lifestyle     Objective:    Today's Vitals   01/06/22 1524  PainSc: 2    There is no height or weight on file to calculate BMI.  Advanced Directives 01/06/2022 08/26/2021 06/16/2020 12/17/2019 06/18/2019 05/28/2019 06/20/2018  Does Patient Have a Medical Advance Directive? No Yes No Yes Yes Yes No  Type of Advance Directive - Information systems manager of [employer] Power of [place];Out of facility DNR (pink MOST or yellow form) [place];Out of facility DNR (pink MOST or yellow form) -  Does patient want to make changes to medical advance directive? - No - Patient declined - - - - -  Copy of [place] in Chart? - - - No - copy requested - No - copy requested -  Would patient like information on creating a medical  advance directive? No - Patient declined - No - Patient declined No - Patient declined - - No - Patient declined    Current Medications (verified) Outpatient Encounter Medications as of 01/06/2022  Medication Sig   amLODipine (NORVASC) 5 MG tablet TAKE 1 TABLET(5 MG) BY MOUTH TWICE DAILY   aspirin EC 81 MG tablet Take 81 mg by mouth daily.   Calcium Carb-Cholecalciferol 600-800 MG-UNIT TABS Take 1 tablet by mouth daily. Reported on 12/25/2015   Cholecalciferol (VITAMIN D-3) 125 MCG (5000 UT) TABS Take 1 tablet by mouth daily.   ELIQUIS 2.5 MG TABS tablet TAKE 1 TABLET(2.5 MG) BY MOUTH TWICE DAILY   isosorbide mononitrate (IMDUR) 30 MG 24 hr tablet TAKE 1 TABLET(30 MG) BY MOUTH DAILY   latanoprost (XALATAN) 0.005 % ophthalmic solution INT 1 GTT INTO EACH EYE QHS.   losartan (COZAAR) 25 MG tablet TAKE 1 TABLET(25 MG) BY MOUTH DAILY   metoprolol succinate (TOPROL-XL) 50 MG 24 hr tablet TAKE 1 TABLET BY MOUTH EVERY NIGHT   potassium chloride (KLOR-CON M) 10 MEQ tablet TAKE 1 TABLET(10 MEQ) BY MOUTH 1 TIME FOR 1 DOSE   timolol (TIMOPTIC) 0.25 % ophthalmic solution INSTILL ONE DROP INTO BOTH EYES EVERY MORNING.   TURMERIC PO Take 1 tablet by mouth daily.   colchicine 0.6 MG tablet TAKE 1 TABLET(0.6 MG) BY MOUTH DAILY AS NEEDED FOR KNEE PAIN (Patient not taking: Reported on 01/06/2022)   nitroGLYCERIN (NITROSTAT) 0.4 MG SL tablet Place 0.4 mg under the tongue every 5 (five) minutes as needed for chest pain. (Patient  not taking: Reported on 08/26/2021)   [DISCONTINUED] anastrozole (ARIMIDEX) 1 MG tablet TAKE 1 TABLET(1 MG) BY MOUTH DAILY   [DISCONTINUED] ibuprofen (ADVIL,MOTRIN) 200 MG tablet Take 200 mg by mouth every 6 (six) hours as needed.   No facility-administered encounter medications on file as of 01/06/2022.    Allergies (verified) Ace inhibitors and Brinzolamide-brimonidine   History: Past Medical History:  Diagnosis Date   Arteriosclerosis of coronary artery    Arthritis of shoulder  region    Breast cancer ([place]) 2002   RT MASTECTOMY   Cataract    Essential hypertension    Glaucoma    Hypercholesteremia    Impaired renal function    Muscle spasms of neck    Tobacco abuse    Past Surgical History:  Procedure Laterality Date   CAROTID ENDARTERECTOMY     CORONARY ARTERY BYPASS GRAFT  2006   MASS EXCISION Right 08/28/2015   Procedure: EXCISION MASS; remove massive right chest wall;  Surgeon: Florene Glen, MD;  Location: [facility] ORS;  Service: General;  Laterality: Right;   MASTECTOMY, RADICAL Right 2002   BREAST CA   TOTAL ABDOMINAL HYSTERECTOMY     Family History  Problem Relation Age of Onset   Hypertension Father    Cancer Father    Breast cancer Mother    Social History   Socioeconomic History   Marital status: Widowed    Spouse name: Not on file   Number of children: 6   Years of education: Not on file   Highest education level: Not on file  Occupational History   Not on file  Tobacco Use   Smoking status: Former    Packs/day: 1.50    Years: 1.50    Pack years: 2.25    Types: Cigarettes    Quit date: 10/04/1952    Years since quitting: 69.3   Smokeless tobacco: Never  Vaping Use   Vaping Use: Never used  Substance and Sexual Activity   Alcohol use: No    Alcohol/week: 0.0 standard drinks   Drug use: No   Sexual activity: Not on file  Other Topics Concern   Not on file  Social History Narrative   Not on file   Social Determinants of Health   Financial Resource Strain: Low Risk    Difficulty of Paying Living Expenses: Not hard at all  Food Insecurity: No Food Insecurity   Worried About Charity fundraiser in the Last Year: Never true   [place] in the Last Year: Never true  Transportation Needs: No Transportation Needs   Lack of Transportation (Medical): No   Lack of Transportation (Non-Medical): No  Physical Activity: Inactive   Days of Exercise per Week: 0 days   Minutes of Exercise per Session: 0 min  Stress: No  Stress Concern Present   Feeling of Stress : Not at all  Social Connections: Moderately Isolated   Frequency of Communication with Friends and Family: More than three times a week   Frequency of Social Gatherings with Friends and Family: Three times a week   Attends Religious Services: More than 4 times per year   Active Member of Clubs or Organizations: No   Attends Archivist Meetings: Never   Marital Status: Widowed    Tobacco Counseling Counseling given: Not Answered   Clinical Intake:  Pre-visit preparation completed: Yes  Pain : 0-10 Pain Score: 2  Pain Type: Chronic pain Pain Location: Knee Pain Orientation: Right Pain Descriptors /  Indicators: Aching, Sore Pain Onset: More than a month ago Pain Frequency: Intermittent     Nutritional Risks: None Diabetes: No  How often do you need to have someone help you when you read instructions, pamphlets, or other written materials from your doctor or pharmacy?: 1 - Never    Interpreter Needed?: No  Information entered by :: Clemetine Marker LPN   Activities of Daily Living In your present state of health, do you have any difficulty performing the following activities: 01/06/2022 06/29/2021  Hearing? N N  Vision? N N  Difficulty concentrating or making decisions? N N  Walking or climbing stairs? Y Y  Dressing or bathing? N N  Doing errands, shopping? N Y  Conservation officer, nature and eating ? N -  Using the Toilet? N -  In the past six months, have you accidently leaked urine? N -  Do you have problems with loss of bowel control? N -  Managing your Medications? N -  Managing your Finances? N -  Housekeeping or managing your Housekeeping? N -  Some recent data might be hidden    Patient Care Team: Glean Hess, MD as PCP - General (Internal Medicine) Noreene Filbert, MD as Referring Physician (Radiation Oncology) Lequita Asal, MD (Inactive) as Referring Physician (Hematology and Oncology)  Indicate any  recent Medical Services you may have received from other than [facility] providers in the past year (date may be approximate).     Assessment:   This is a routine wellness examination for [place].  Hearing/Vision screen Hearing Screening - Comments:: Pt denies hearing difficulty Vision Screening - Comments:: Annual vision screenings at [facility]  Dietary issues and exercise activities discussed: Current Exercise Habits: The patient does not participate in regular exercise at present, Exercise limited by: orthopedic condition(s)   Goals Addressed             This Visit's Progress    Increase water intake   Not on track    Recommend to increase fluid intake from 2 glasses of water per day to 4 glasses per day       Depression Screen PHQ 2/9 Scores 01/06/2022 06/29/2021 02/06/2021 01/06/2021 06/30/2020 11/16/2019 06/04/2019  PHQ - 2 Score 0 0 0 0 0 0 0  PHQ- 9 Score - 4 0 0 - 0 -  Exception Documentation - - - - - - -    Fall Risk Fall Risk  01/06/2022 06/29/2021 02/06/2021 01/06/2021 07/09/2020  Falls in the past year? 0 1 1 1 1   Number falls in past yr: 0 1 1 1  0  Injury with Fall? 0 0 0 0 -  Risk Factor Category  - - - - -  Risk for fall due to : Impaired balance/gait History of fall(s) - History of fall(s);Impaired balance/gait -  Follow up Falls prevention discussed Falls evaluation completed Falls evaluation completed Falls evaluation completed Falls evaluation completed    FALL RISK PREVENTION PERTAINING TO THE HOME:  Any stairs in or around the home? Yes  If so, are there any without handrails? Yes - front steps - also has ramp Home free of loose throw rugs in walkways, pet beds, electrical cords, etc? Yes  Adequate lighting in your home to reduce risk of falls? Yes   ASSISTIVE DEVICES UTILIZED TO PREVENT FALLS:  Life alert? No Use of a cane, walker or w/c? Yes  Grab bars in the bathroom? Yes  Shower chair or bench in shower? Yes  Elevated toilet seat or  a handicapped toilet? No    TIMED UP AND GO:  Was the test performed? No . Telephonic visit.  Cognitive Function: Cognitive status assessed by direct observation. Patient has current diagnosis of cognitive impairment.     MMSE - Mini Mental State Exam 08/03/2016  Orientation to time 5  Orientation to Place 5  Registration 3  Attention/ Calculation 5  Recall 2  Language- name 2 objects 2     6CIT Screen 08/26/2020 07/09/2020 05/28/2019  What Year? 0 points 4 points 0 points  What month? 3 points 3 points 3 points  What time? 3 points 3 points 0 points  Count back from 20 0 points 0 points 0 points  Months in reverse 2 points 4 points 0 points  Repeat phrase 8 points 10 points 8 points  Total Score 16 24 11     Immunizations Immunization History  Administered Date(s) Administered   Pneumococcal Polysaccharide-23 11/30/2004   Tdap 08/09/2016    TDAP status: Up to date  Flu Vaccine status: Declined, Education has been provided regarding the importance of this vaccine but patient still declined. Advised may receive this vaccine at local pharmacy or Health Dept. Aware to provide a copy of the vaccination record if obtained from local pharmacy or Health Dept. Verbalized acceptance and understanding.  Pneumococcal vaccine status: Declined,  Education has been provided regarding the importance of this vaccine but patient still declined. Advised may receive this vaccine at local pharmacy or Health Dept. Aware to provide a copy of the vaccination record if obtained from local pharmacy or Health Dept. Verbalized acceptance and understanding.   Covid-19 vaccine status: Declined, Education has been provided regarding the importance of this vaccine but patient still declined. Advised may receive this vaccine at local pharmacy or Health Dept.or vaccine clinic. Aware to provide a copy of the vaccination record if obtained from local pharmacy or Health Dept. Verbalized acceptance and understanding.  Qualifies for Shingles  Vaccine? Yes   Zostavax completed No   Shingrix Completed?: No.    Education has been provided regarding the importance of this vaccine. Patient has been advised to call insurance company to determine out of pocket expense if they have not yet received this vaccine. Advised may also receive vaccine at local pharmacy or Health Dept. Verbalized acceptance and understanding.  Screening Tests Health Maintenance  Topic Date Due   COVID-19 Vaccine (1) Never done   Zoster Vaccines- Shingrix (1 of 2) Never done   Pneumonia Vaccine 76+ Years old (2 - PCV) 11/30/2005   MAMMOGRAM  08/19/2022   TETANUS/TDAP  08/09/2026   DEXA SCAN  Completed   HPV VACCINES  Aged Out    Health Maintenance  Health Maintenance Due  Topic Date Due   COVID-19 Vaccine (1) Never done   Zoster Vaccines- Shingrix (1 of 2) Never done   Pneumonia Vaccine 31+ Years old (2 - PCV) 11/30/2005    Colorectal cancer screening: No longer required.   Mammogram status: Completed 08/19/21. Repeat every year  Bone Density status: Completed 08/19/21. Results reflect: Bone density results: OSTEOPOROSIS. Repeat every 2 years.  Lung Cancer Screening: (Low Dose CT Chest recommended if Age 20-80 years, 30 pack-year currently smoking OR have quit w/in 15years.) does not qualify.   Additional Screening:  Hepatitis C Screening: does not qualify  Vision Screening: Recommended annual ophthalmology exams for early detection of glaucoma and other disorders of the eye. Is the patient up to date with their annual eye exam?  Yes  Who  is the provider or what is the name of the office in which the patient attends annual eye exams? [facility].   Dental Screening: Recommended annual dental exams for proper oral hygiene  Community Resource Referral / Chronic Care Management: CRR required this visit?  No   CCM required this visit?  No      Plan:     I have personally reviewed and noted the following in the patients chart:   Medical and  social history Use of alcohol, tobacco or illicit drugs  Current medications and supplements including opioid prescriptions.  Functional ability and status Nutritional status Physical activity Advanced directives List of other physicians Hospitalizations, surgeries, and ER visits in previous 12 months Vitals Screenings to include cognitive, depression, and falls Referrals and appointments  In addition, I have reviewed and discussed with patient certain preventive protocols, quality metrics, and best practice recommendations. A written personalized care plan for preventive services as well as general preventive health recommendations were provided to patient.     Clemetine Marker, LPN   02/01/6293   Nurse Notes: none

## 2022-01-06 NOTE — Patient Instructions (Signed)
Donna Santos , Thank you for taking time to come for your Medicare Wellness Visit. I appreciate your ongoing commitment to your health goals. Please review the following plan we discussed and let me know if I can assist you in the future.   Screening recommendations/referrals: Colonoscopy: no longer required  Mammogram: done 08/19/21 Bone Density: done 08/19/21 Recommended yearly ophthalmology/optometry visit for glaucoma screening and checkup Recommended yearly dental visit for hygiene and checkup  Vaccinations: Influenza vaccine: declined Pneumococcal vaccine: declined Tdap vaccine: done 08/09/16 Shingles vaccine: Shingrix discussed. Please contact your pharmacy for coverage information.  Covid-19:declined  Advanced directives: Please bring a copy of your health care power of attorney and living will to the office at your convenience once you have completed that paperwork.   Conditions/risks identified: Recommend drinking 6-8 glasses of water per day   Next appointment: Follow up in one year for your annual wellness visit    Preventive Care 65 Years and Older, Female Preventive care refers to lifestyle choices and visits with your health care provider that can promote health and wellness. What does preventive care include? A yearly physical exam. This is also called an annual well check. Dental exams once or twice a year. Routine eye exams. Ask your health care provider how often you should have your eyes checked. Personal lifestyle choices, including: Daily care of your teeth and gums. Regular physical activity. Eating a healthy diet. Avoiding tobacco and drug use. Limiting alcohol use. Practicing safe sex. Taking low-dose aspirin every day. Taking vitamin and mineral supplements as recommended by your health care provider. What happens during an annual well check? The services and screenings done by your health care provider during your annual well check will depend on your  age, overall health, lifestyle risk factors, and family history of disease. Counseling  Your health care provider may ask you questions about your: Alcohol use. Tobacco use. Drug use. Emotional well-being. Home and relationship well-being. Sexual activity. Eating habits. History of falls. Memory and ability to understand (cognition). Work and work Statistician. Reproductive health. Screening  You may have the following tests or measurements: Height, weight, and BMI. Blood pressure. Lipid and cholesterol levels. These may be checked every 5 years, or more frequently if you are over 72 years old. Skin check. Lung cancer screening. You may have this screening every year starting at age 69 if you have a 30-pack-year history of smoking and currently smoke or have quit within the past 15 years. Fecal occult blood test (FOBT) of the stool. You may have this test every year starting at age 88. Flexible sigmoidoscopy or colonoscopy. You may have a sigmoidoscopy every 5 years or a colonoscopy every 10 years starting at age 50. Hepatitis C blood test. Hepatitis B blood test. Sexually transmitted disease (STD) testing. Diabetes screening. This is done by checking your blood sugar (glucose) after you have not eaten for a while (fasting). You may have this done every 1-3 years. Bone density scan. This is done to screen for osteoporosis. You may have this done starting at age 42. Mammogram. This may be done every 1-2 years. Talk to your health care provider about how often you should have regular mammograms. Talk with your health care provider about your test results, treatment options, and if necessary, the need for more tests. Vaccines  Your health care provider may recommend certain vaccines, such as: Influenza vaccine. This is recommended every year. Tetanus, diphtheria, and acellular pertussis (Tdap, Td) vaccine. You may need a Td booster  every 10 years. Zoster vaccine. You may need this after  age 26. Pneumococcal 13-valent conjugate (PCV13) vaccine. One dose is recommended after age 67. Pneumococcal polysaccharide (PPSV23) vaccine. One dose is recommended after age 61. Talk to your health care provider about which screenings and vaccines you need and how often you need them. This information is not intended to replace advice given to you by your health care provider. Make sure you discuss any questions you have with your health care provider. Document Released: 12/12/2015 Document Revised: 08/04/2016 Document Reviewed: 09/16/2015 Elsevier Interactive Patient Education  2017 [place] Prevention in the Home Falls can cause injuries. They can happen to people of all ages. There are many things you can do to make your home safe and to help prevent falls. What can I do on the outside of my home? Regularly fix the edges of walkways and driveways and fix any cracks. Remove anything that might make you trip as you walk through a door, such as a raised step or threshold. Trim any bushes or trees on the path to your home. Use bright outdoor lighting. Clear any walking paths of anything that might make someone trip, such as rocks or tools. Regularly check to see if handrails are loose or broken. Make sure that both sides of any steps have handrails. Any raised decks and porches should have guardrails on the edges. Have any leaves, snow, or ice cleared regularly. Use sand or salt on walking paths during winter. Clean up any spills in your garage right away. This includes oil or grease spills. What can I do in the bathroom? Use night lights. Install grab bars by the toilet and in the tub and shower. Do not use towel bars as grab bars. Use non-skid mats or decals in the tub or shower. If you need to sit down in the shower, use a plastic, non-slip stool. Keep the floor dry. Clean up any water that spills on the floor as soon as it happens. Remove soap buildup in the tub or shower  regularly. Attach bath mats securely with double-sided non-slip rug tape. Do not have throw rugs and other things on the floor that can make you trip. What can I do in the bedroom? Use night lights. Make sure that you have a light by your bed that is easy to reach. Do not use any sheets or blankets that are too big for your bed. They should not hang down onto the floor. Have a firm chair that has side arms. You can use this for support while you get dressed. Do not have throw rugs and other things on the floor that can make you trip. What can I do in the kitchen? Clean up any spills right away. Avoid walking on wet floors. Keep items that you use a lot in easy-to-reach places. If you need to reach something above you, use a strong step stool that has a grab bar. Keep electrical cords out of the way. Do not use floor polish or wax that makes floors slippery. If you must use wax, use non-skid floor wax. Do not have throw rugs and other things on the floor that can make you trip. What can I do with my stairs? Do not leave any items on the stairs. Make sure that there are handrails on both sides of the stairs and use them. Fix handrails that are broken or loose. Make sure that handrails are as long as the stairways. Check any carpeting to  make sure that it is firmly attached to the stairs. Fix any carpet that is loose or worn. Avoid having throw rugs at the top or bottom of the stairs. If you do have throw rugs, attach them to the floor with carpet tape. Make sure that you have a light switch at the top of the stairs and the bottom of the stairs. If you do not have them, ask someone to add them for you. What else can I do to help prevent falls? Wear shoes that: Do not have high heels. Have rubber bottoms. Are comfortable and fit you well. Are closed at the toe. Do not wear sandals. If you use a stepladder: Make sure that it is fully opened. Do not climb a closed stepladder. Make sure that  both sides of the stepladder are locked into place. Ask someone to hold it for you, if possible. Clearly mark and make sure that you can see: Any grab bars or handrails. First and last steps. Where the edge of each step is. Use tools that help you move around (mobility aids) if they are needed. These include: Canes. Walkers. Scooters. Crutches. Turn on the lights when you go into a dark area. Replace any light bulbs as soon as they burn out. Set up your furniture so you have a clear path. Avoid moving your furniture around. If any of your floors are uneven, fix them. If there are any pets around you, be aware of where they are. Review your medicines with your doctor. Some medicines can make you feel dizzy. This can increase your chance of falling. Ask your doctor what other things that you can do to help prevent falls. This information is not intended to replace advice given to you by your health care provider. Make sure you discuss any questions you have with your health care provider. Document Released: 09/11/2009 Document Revised: 04/22/2016 Document Reviewed: 12/20/2014 Elsevier Interactive Patient Education  2017 [employer].

## 2022-01-16 ENCOUNTER — Other Ambulatory Visit: Payer: Self-pay | Admitting: Internal Medicine

## 2022-01-18 NOTE — Telephone Encounter (Signed)
Requested Prescriptions  Pending Prescriptions Disp Refills   losartan (COZAAR) 25 MG tablet [Pharmacy Med Name: LOSARTAN 25MG  TABLETS] 90 tablet 0    Sig: TAKE 1 TABLET(25 MG) BY MOUTH DAILY     Cardiovascular:  Angiotensin Receptor Blockers Failed - 01/16/2022  4:16 PM      Failed - Cr in normal range and within 180 days    Creatinine, Ser  Date Value Ref Range Status  06/17/2021 0.83 0.44 - 1.00 mg/dL Final         Failed - K in normal range and within 180 days    Potassium  Date Value Ref Range Status  06/17/2021 3.5 3.5 - 5.1 mmol/L Final         Failed - Last BP in normal range    BP Readings from Last 1 Encounters:  06/29/21 140/84         Passed - Patient is not pregnant      Passed - Valid encounter within last 6 months    Recent Outpatient Visits          6 months ago Essential (primary) hypertension   [facility] Glean Hess, [facility]   11 months ago Pseudogout of hand, left   [facility] Glean Hess, MD   1 year ago Essential (primary) hypertension   [facility] Glean Hess, MD   1 year ago Pseudogout of right knee   [facility] Glean Hess, MD   1 year ago Senile dementia, uncomplicated [facility])   [facility] Glean Hess, MD

## 2022-01-23 ENCOUNTER — Other Ambulatory Visit: Payer: Self-pay | Admitting: Internal Medicine

## 2022-01-25 NOTE — Telephone Encounter (Signed)
Future fill request- attempted to call patient to verify she was able to get medication when sent to pharmacy 1 week ago- left message to call back if needed. Requested Prescriptions  Pending Prescriptions Disp Refills   losartan (COZAAR) 25 MG tablet [Pharmacy Med Name: LOSARTAN 25MG  TABLETS] 30 tablet     Sig: TAKE 1 TABLET(25 MG) BY MOUTH DAILY     Cardiovascular:  Angiotensin Receptor Blockers Failed - 01/23/2022  8:00 AM      Failed - Cr in normal range and within 180 days    Creatinine, Ser  Date Value Ref Range Status  06/17/2021 0.83 0.44 - 1.00 mg/dL Final         Failed - K in normal range and within 180 days    Potassium  Date Value Ref Range Status  06/17/2021 3.5 3.5 - 5.1 mmol/L Final         Failed - Last BP in normal range    BP Readings from Last 1 Encounters:  06/29/21 140/84         Passed - Patient is not pregnant      Passed - Valid encounter within last 6 months    Recent Outpatient Visits          7 months ago Essential (primary) hypertension   [facility] Glean Hess, MD   11 months ago Pseudogout of hand, left   [facility] Glean Hess, MD   1 year ago Essential (primary) hypertension   [facility] Glean Hess, MD   1 year ago Pseudogout of right knee   [facility] Glean Hess, MD   1 year ago Senile dementia, uncomplicated [facility])   [facility] Glean Hess, MD

## 2022-01-28 ENCOUNTER — Other Ambulatory Visit: Payer: Self-pay | Admitting: Internal Medicine

## 2022-01-28 NOTE — Telephone Encounter (Signed)
Requested Prescriptions  ?Pending Prescriptions Disp Refills  ?? ELIQUIS 2.5 MG TABS tablet [Pharmacy Med Name: ELIQUIS 2.5MG  TABLETS] 180 tablet 0  ?  Sig: TAKE 1 TABLET(2.5 MG) BY MOUTH TWICE DAILY  ?  ? Hematology:  Anticoagulants - apixaban Passed - 01/28/2022  8:00 AM  ?  ?  Passed - PLT in normal range and within 360 days  ?  Platelets  ?Date Value Ref Range Status  ?06/17/2021 322 150 - 400 K/uL Final  ?06/04/2019 361 150 - 450 x10E3/uL Final  ?   ?  ?  Passed - HGB in normal range and within 360 days  ?  Hemoglobin  ?Date Value Ref Range Status  ?06/17/2021 13.0 12.0 - 15.0 g/dL Final  ?06/04/2019 13.3 11.1 - 15.9 g/dL Final  ?   ?  ?  Passed - HCT in normal range and within 360 days  ?  HCT  ?Date Value Ref Range Status  ?06/17/2021 38.6 36.0 - 46.0 % Final  ? ?Hematocrit  ?Date Value Ref Range Status  ?06/04/2019 39.8 34.0 - 46.6 % Final  ?   ?  ?  Passed - Cr in normal range and within 360 days  ?  Creatinine, Ser  ?Date Value Ref Range Status  ?06/17/2021 0.83 0.44 - 1.00 mg/dL Final  ?   ?  ?  Passed - AST in normal range and within 360 days  ?  AST  ?Date Value Ref Range Status  ?06/17/2021 16 15 - 41 U/L Final  ?   ?  ?  Passed - ALT in normal range and within 360 days  ?  ALT  ?Date Value Ref Range Status  ?06/17/2021 14 0 - 44 U/L Final  ?   ?  ?  Passed - Valid encounter within last 12 months  ?  Recent Outpatient Visits   ?      ? 7 months ago Essential (primary) hypertension  ? [facility] Glean Hess, MD  ? 11 months ago Pseudogout of hand, left  ? [facility] Glean Hess, MD  ? 1 year ago Essential (primary) hypertension  ? [facility] Glean Hess, MD  ? 1 year ago Pseudogout of right knee  ? [facility] Glean Hess, MD  ? 1 year ago Senile dementia, uncomplicated ([place])  ? [facility] Glean Hess, MD  ?  ?  ? ?  ?  ?  ? ?

## 2022-02-16 ENCOUNTER — Other Ambulatory Visit: Payer: Self-pay | Admitting: Internal Medicine

## 2022-02-16 DIAGNOSIS — E876 Hypokalemia: Secondary | ICD-10-CM

## 2022-02-17 NOTE — Telephone Encounter (Signed)
Requested Prescriptions  ?Pending Prescriptions Disp Refills  ?? potassium chloride (KLOR-CON M) 10 MEQ tablet [Pharmacy Med Name: POTASSIUM CL MICRO 10MEQ ER TABS] 90 tablet 1  ?  Sig: TAKE 1 TABLET(10 MEQ) BY MOUTH EVERY DAY  ?  ? Endocrinology:  Minerals - Potassium Supplementation Passed - 02/16/2022  8:00 AM  ?  ?  Passed - K in normal range and within 360 days  ?  Potassium  ?Date Value Ref Range Status  ?06/17/2021 3.5 3.5 - 5.1 mmol/L Final  ?   ?  ?  Passed - Cr in normal range and within 360 days  ?  Creatinine, Ser  ?Date Value Ref Range Status  ?06/17/2021 0.83 0.44 - 1.00 mg/dL Final  ?   ?  ?  Passed - Valid encounter within last 12 months  ?  Recent Outpatient Visits   ?      ? 7 months ago Essential (primary) hypertension  ? [facility] Glean Hess, MD  ? 1 year ago Pseudogout of hand, left  ? [facility] Glean Hess, MD  ? 1 year ago Essential (primary) hypertension  ? [facility] Glean Hess, MD  ? 1 year ago Pseudogout of right knee  ? [facility] Glean Hess, MD  ? 1 year ago Senile dementia, uncomplicated ([place])  ? [facility] Glean Hess, MD  ?  ?  ? ?  ?  ?  ? ?

## 2022-02-23 ENCOUNTER — Inpatient Hospital Stay: Payer: Medicare Other | Attending: Nurse Practitioner | Admitting: Nurse Practitioner

## 2022-02-27 HISTORY — PX: OTHER SURGICAL HISTORY: SHX169

## 2022-03-02 ENCOUNTER — Other Ambulatory Visit: Payer: Self-pay | Admitting: Internal Medicine

## 2022-03-03 ENCOUNTER — Telehealth: Payer: Self-pay | Admitting: Internal Medicine

## 2022-03-03 NOTE — Telephone Encounter (Signed)
Too soon to refill  ?Last RF 01/28/22 to last 3 months ?Same pharmacy ?Requested Prescriptions  ?Refused Prescriptions Disp Refills  ?? ELIQUIS 2.5 MG TABS tablet [Pharmacy Med Name: ELIQUIS 2.'5MG'$  TABLETS] 180 tablet 0  ?  Sig: TAKE 1 TABLET(2.5 MG) BY MOUTH TWICE DAILY  ?  ? Hematology:  Anticoagulants - apixaban Passed - 03/02/2022  8:00 AM  ?  ?  Passed - PLT in normal range and within 360 days  ?  Platelets  ?Date Value Ref Range Status  ?06/17/2021 322 150 - 400 K/uL Final  ?06/04/2019 361 150 - 450 x10E3/uL Final  ?   ?  ?  Passed - HGB in normal range and within 360 days  ?  Hemoglobin  ?Date Value Ref Range Status  ?06/17/2021 13.0 12.0 - 15.0 g/dL Final  ?06/04/2019 13.3 11.1 - 15.9 g/dL Final  ?   ?  ?  Passed - HCT in normal range and within 360 days  ?  HCT  ?Date Value Ref Range Status  ?06/17/2021 38.6 36.0 - 46.0 % Final  ? ?Hematocrit  ?Date Value Ref Range Status  ?06/04/2019 39.8 34.0 - 46.6 % Final  ?   ?  ?  Passed - Cr in normal range and within 360 days  ?  Creatinine, Ser  ?Date Value Ref Range Status  ?06/17/2021 0.83 0.44 - 1.00 mg/dL Final  ?   ?  ?  Passed - AST in normal range and within 360 days  ?  AST  ?Date Value Ref Range Status  ?06/17/2021 16 15 - 41 U/L Final  ?   ?  ?  Passed - ALT in normal range and within 360 days  ?  ALT  ?Date Value Ref Range Status  ?06/17/2021 14 0 - 44 U/L Final  ?   ?  ?  Passed - Valid encounter within last 12 months  ?  Recent Outpatient Visits   ?      ? 8 months ago Essential (primary) hypertension  ? [facility] Glean Hess, MD  ? 1 year ago Pseudogout of hand, left  ? [facility] Glean Hess, MD  ? 1 year ago Essential (primary) hypertension  ? [facility] Glean Hess, MD  ? 1 year ago Pseudogout of right knee  ? [facility] Glean Hess, MD  ? 1 year ago Senile dementia, uncomplicated ([place])  ? [facility] Glean Hess, MD  ?  ?  ? ?  ?  ?  ? ? ?

## 2022-03-03 NOTE — Telephone Encounter (Signed)
Last refill Eliquis on 01/28/22 #90/0 refills per refill protocol by [facility], patient's son requesting additional refills to be sent to the pharmacy, routing to provider for approval. ?

## 2022-03-03 NOTE — Telephone Encounter (Signed)
Pt son Donna Santos stated he just picked up a 65-monthsupply but would like refills ready to be picked up for medication ELIQUIS 2.5 MG TABS tablet zero refills remain on this prescription. Pt son Donna Santos requesting advance approval of refills for this medication to prevent any missed doses. ? ?Donna Santos requesting a callback. ? ?Preferred pharmacy.  ? ?[facility] [address] [id number] ?Phone: [phone]: [phone] ?Hours: Not open 24 hours  ? ?

## 2022-03-04 ENCOUNTER — Other Ambulatory Visit: Payer: Self-pay | Admitting: Internal Medicine

## 2022-03-04 NOTE — Telephone Encounter (Signed)
Please schedule pt a follow up appt for HTN. ? ?KP

## 2022-03-05 ENCOUNTER — Other Ambulatory Visit: Payer: Self-pay | Admitting: Internal Medicine

## 2022-03-05 NOTE — Telephone Encounter (Signed)
Requested medication (s) are due for refill today:   Yes ? ?Requested medication (s) are on the active medication list:   Yes ? ?Future visit scheduled:   Yes in 2 wks ? ? ?Last ordered: 01/18/2022 #90, 0 refills ? ?Returned because no refills remain on this rx.   Has upcoming appt.  ? ?Requested Prescriptions  ?Pending Prescriptions Disp Refills  ? losartan (COZAAR) 25 MG tablet [Pharmacy Med Name: LOSARTAN '25MG'$  TABLETS] 30 tablet   ?  Sig: TAKE 1 TABLET(25 MG) BY MOUTH DAILY  ?  ? Cardiovascular:  Angiotensin Receptor Blockers Failed - 03/04/2022  3:46 PM  ?  ?  Failed - Cr in normal range and within 180 days  ?  Creatinine, Ser  ?Date Value Ref Range Status  ?06/17/2021 0.83 0.44 - 1.00 mg/dL Final  ?  ?  ?  ?  Failed - K in normal range and within 180 days  ?  Potassium  ?Date Value Ref Range Status  ?06/17/2021 3.5 3.5 - 5.1 mmol/L Final  ?  ?  ?  ?  Failed - Last BP in normal range  ?  BP Readings from Last 1 Encounters:  ?06/29/21 140/84  ?  ?  ?  ?  Passed - Patient is not pregnant  ?  ?  Passed - Valid encounter within last 6 months  ?  Recent Outpatient Visits   ? ?      ? 8 months ago Essential (primary) hypertension  ? [facility] Glean Hess, MD  ? 1 year ago Pseudogout of hand, left  ? [facility] Glean Hess, MD  ? 1 year ago Essential (primary) hypertension  ? [facility] Glean Hess, MD  ? 1 year ago Pseudogout of right knee  ? [facility] Glean Hess, MD  ? 1 year ago Senile dementia, uncomplicated ([place])  ? [facility] Glean Hess, MD  ? ?  ?  ?Future Appointments   ? ?        ? In 2 weeks Army Melia Jesse Sans, MD [facility] [place]  ? ?  ? ?  ?  ?  ? ?

## 2022-03-08 NOTE — Telephone Encounter (Signed)
Requested medications are due for refill today.  no ? ?Requested medications are on the active medications list.  yes ? ?Last refill. 03/05/2022 ? ?Future visit scheduled.   yes ? ?Notes to clinic.  Pt was given a 30 day courtesy refill 03/05/2022 ? ? ? ?Requested Prescriptions  ?Pending Prescriptions Disp Refills  ? losartan (COZAAR) 25 MG tablet [Pharmacy Med Name: LOSARTAN '25MG'$  TABLETS] 90 tablet   ?  Sig: TAKE 1 TABLET(25 MG) BY MOUTH DAILY  ?  ? Cardiovascular:  Angiotensin Receptor Blockers Failed - 03/05/2022 11:55 AM  ?  ?  Failed - Cr in normal range and within 180 days  ?  Creatinine, Ser  ?Date Value Ref Range Status  ?06/17/2021 0.83 0.44 - 1.00 mg/dL Final  ?  ?  ?  ?  Failed - K in normal range and within 180 days  ?  Potassium  ?Date Value Ref Range Status  ?06/17/2021 3.5 3.5 - 5.1 mmol/L Final  ?  ?  ?  ?  Failed - Last BP in normal range  ?  BP Readings from Last 1 Encounters:  ?06/29/21 140/84  ?  ?  ?  ?  Passed - Patient is not pregnant  ?  ?  Passed - Valid encounter within last 6 months  ?  Recent Outpatient Visits   ? ?      ? 8 months ago Essential (primary) hypertension  ? [facility] Glean Hess, MD  ? 1 year ago Pseudogout of hand, left  ? [facility] Gle[facility], [facility]  ? 1 year ago Essential (primary) hypertension  ? [facility] Gle[facility], MD  ? 1 year ago Pseudogout of right knee  ? [facility] Glean Hess, MD  ? 1 year ago Senile dementia, uncomplicated ([place])  ? [facility] Glean Hess, MD  ? ?  ?  ?Future Appointments   ? ?        ? In 1 week Glean Hess, MD [facility], [place]  ? ?  ? ?  ?  ?  ?  ?

## 2022-03-14 DIAGNOSIS — M00861 Arthritis due to other bacteria, right knee: Secondary | ICD-10-CM | POA: Diagnosis not present

## 2022-03-14 DIAGNOSIS — I10 Essential (primary) hypertension: Secondary | ICD-10-CM | POA: Diagnosis not present

## 2022-03-14 DIAGNOSIS — Z87891 Personal history of nicotine dependence: Secondary | ICD-10-CM | POA: Diagnosis not present

## 2022-03-14 DIAGNOSIS — M109 Gout, unspecified: Secondary | ICD-10-CM | POA: Diagnosis not present

## 2022-03-14 DIAGNOSIS — M1711 Unilateral primary osteoarthritis, right knee: Secondary | ICD-10-CM | POA: Diagnosis not present

## 2022-03-14 DIAGNOSIS — Z951 Presence of aortocoronary bypass graft: Secondary | ICD-10-CM | POA: Diagnosis not present

## 2022-03-14 DIAGNOSIS — Z01818 Encounter for other preprocedural examination: Secondary | ICD-10-CM | POA: Diagnosis not present

## 2022-03-14 DIAGNOSIS — Z853 Personal history of malignant neoplasm of breast: Secondary | ICD-10-CM | POA: Diagnosis not present

## 2022-03-14 DIAGNOSIS — Z791 Long term (current) use of non-steroidal anti-inflammatories (NSAID): Secondary | ICD-10-CM | POA: Diagnosis not present

## 2022-03-14 DIAGNOSIS — M25461 Effusion, right knee: Secondary | ICD-10-CM | POA: Diagnosis not present

## 2022-03-14 DIAGNOSIS — Z20822 Contact with and (suspected) exposure to covid-19: Secondary | ICD-10-CM | POA: Diagnosis not present

## 2022-03-14 DIAGNOSIS — R410 Disorientation, unspecified: Secondary | ICD-10-CM | POA: Diagnosis not present

## 2022-03-14 DIAGNOSIS — S8991XA Unspecified injury of right lower leg, initial encounter: Secondary | ICD-10-CM | POA: Diagnosis not present

## 2022-03-14 DIAGNOSIS — Z66 Do not resuscitate: Secondary | ICD-10-CM | POA: Diagnosis not present

## 2022-03-14 DIAGNOSIS — I25810 Atherosclerosis of coronary artery bypass graft(s) without angina pectoris: Secondary | ICD-10-CM | POA: Diagnosis not present

## 2022-03-14 DIAGNOSIS — Z7982 Long term (current) use of aspirin: Secondary | ICD-10-CM | POA: Diagnosis not present

## 2022-03-14 DIAGNOSIS — Z9010 Acquired absence of unspecified breast and nipple: Secondary | ICD-10-CM | POA: Diagnosis not present

## 2022-03-14 DIAGNOSIS — I4891 Unspecified atrial fibrillation: Secondary | ICD-10-CM | POA: Diagnosis not present

## 2022-03-14 DIAGNOSIS — M009 Pyogenic arthritis, unspecified: Secondary | ICD-10-CM | POA: Diagnosis not present

## 2022-03-14 DIAGNOSIS — H409 Unspecified glaucoma: Secondary | ICD-10-CM | POA: Diagnosis not present

## 2022-03-14 DIAGNOSIS — I4819 Other persistent atrial fibrillation: Secondary | ICD-10-CM | POA: Diagnosis not present

## 2022-03-14 DIAGNOSIS — M11261 Other chondrocalcinosis, right knee: Secondary | ICD-10-CM | POA: Diagnosis not present

## 2022-03-14 DIAGNOSIS — Z79811 Long term (current) use of aromatase inhibitors: Secondary | ICD-10-CM | POA: Diagnosis not present

## 2022-03-14 DIAGNOSIS — R531 Weakness: Secondary | ICD-10-CM | POA: Diagnosis not present

## 2022-03-14 DIAGNOSIS — Z7901 Long term (current) use of anticoagulants: Secondary | ICD-10-CM | POA: Diagnosis not present

## 2022-03-14 DIAGNOSIS — Z79899 Other long term (current) drug therapy: Secondary | ICD-10-CM | POA: Diagnosis not present

## 2022-03-14 DIAGNOSIS — R9431 Abnormal electrocardiogram [ECG] [EKG]: Secondary | ICD-10-CM | POA: Diagnosis not present

## 2022-03-14 DIAGNOSIS — M25561 Pain in right knee: Secondary | ICD-10-CM | POA: Diagnosis not present

## 2022-03-14 DIAGNOSIS — I2510 Atherosclerotic heart disease of native coronary artery without angina pectoris: Secondary | ICD-10-CM | POA: Diagnosis not present

## 2022-03-19 ENCOUNTER — Ambulatory Visit: Payer: Medicare Other | Admitting: Internal Medicine

## 2022-03-22 ENCOUNTER — Telehealth: Payer: Self-pay | Admitting: Internal Medicine

## 2022-03-22 NOTE — Telephone Encounter (Signed)
Copied from [place] [id number]. Topic: General - Other ?>> Mar 22, 2022 12:41 PM Tessa Lerner A wrote: ?Reason for CRM: Eric with [facility] would like to speak with a member of clinical staff when possible  ? ?Randall Hiss would like to review orders for the patient's home health care  ? ?The patient will be discharged from [facility] today 03/22/22  ? ?The patient was admitted on 03/15/22 and the admitting diagnosis was unknown at the time of call  ? ?Please contact Randall Hiss further when possible ?

## 2022-03-22 NOTE — Telephone Encounter (Signed)
Spoke with Randall Hiss. He just wanted to let us know pt was being discharged home, and they will be working on OT and PT with her and will send ordered when they get paperwork ready. ?

## 2022-03-24 DIAGNOSIS — E7800 Pure hypercholesterolemia, unspecified: Secondary | ICD-10-CM | POA: Diagnosis not present

## 2022-03-24 DIAGNOSIS — H401122 Primary open-angle glaucoma, left eye, moderate stage: Secondary | ICD-10-CM | POA: Diagnosis not present

## 2022-03-24 DIAGNOSIS — Z951 Presence of aortocoronary bypass graft: Secondary | ICD-10-CM | POA: Diagnosis not present

## 2022-03-24 DIAGNOSIS — Z853 Personal history of malignant neoplasm of breast: Secondary | ICD-10-CM | POA: Diagnosis not present

## 2022-03-24 DIAGNOSIS — M19019 Primary osteoarthritis, unspecified shoulder: Secondary | ICD-10-CM | POA: Diagnosis not present

## 2022-03-24 DIAGNOSIS — M810 Age-related osteoporosis without current pathological fracture: Secondary | ICD-10-CM | POA: Diagnosis not present

## 2022-03-24 DIAGNOSIS — Z7901 Long term (current) use of anticoagulants: Secondary | ICD-10-CM | POA: Diagnosis not present

## 2022-03-24 DIAGNOSIS — Z955 Presence of coronary angioplasty implant and graft: Secondary | ICD-10-CM | POA: Diagnosis not present

## 2022-03-24 DIAGNOSIS — I2510 Atherosclerotic heart disease of native coronary artery without angina pectoris: Secondary | ICD-10-CM | POA: Diagnosis not present

## 2022-03-24 DIAGNOSIS — I10 Essential (primary) hypertension: Secondary | ICD-10-CM | POA: Diagnosis not present

## 2022-03-24 DIAGNOSIS — M009 Pyogenic arthritis, unspecified: Secondary | ICD-10-CM | POA: Diagnosis not present

## 2022-03-24 DIAGNOSIS — Z7982 Long term (current) use of aspirin: Secondary | ICD-10-CM | POA: Diagnosis not present

## 2022-03-24 DIAGNOSIS — I4819 Other persistent atrial fibrillation: Secondary | ICD-10-CM | POA: Diagnosis not present

## 2022-03-24 DIAGNOSIS — Z791 Long term (current) use of non-steroidal anti-inflammatories (NSAID): Secondary | ICD-10-CM | POA: Diagnosis not present

## 2022-03-25 ENCOUNTER — Telehealth: Payer: Self-pay | Admitting: Internal Medicine

## 2022-03-25 DIAGNOSIS — I4819 Other persistent atrial fibrillation: Secondary | ICD-10-CM | POA: Diagnosis not present

## 2022-03-25 DIAGNOSIS — Z951 Presence of aortocoronary bypass graft: Secondary | ICD-10-CM | POA: Diagnosis not present

## 2022-03-25 DIAGNOSIS — Z7982 Long term (current) use of aspirin: Secondary | ICD-10-CM | POA: Diagnosis not present

## 2022-03-25 DIAGNOSIS — E7800 Pure hypercholesterolemia, unspecified: Secondary | ICD-10-CM | POA: Diagnosis not present

## 2022-03-25 DIAGNOSIS — H401122 Primary open-angle glaucoma, left eye, moderate stage: Secondary | ICD-10-CM | POA: Diagnosis not present

## 2022-03-25 DIAGNOSIS — I10 Essential (primary) hypertension: Secondary | ICD-10-CM | POA: Diagnosis not present

## 2022-03-25 DIAGNOSIS — I2510 Atherosclerotic heart disease of native coronary artery without angina pectoris: Secondary | ICD-10-CM | POA: Diagnosis not present

## 2022-03-25 DIAGNOSIS — Z791 Long term (current) use of non-steroidal anti-inflammatories (NSAID): Secondary | ICD-10-CM | POA: Diagnosis not present

## 2022-03-25 DIAGNOSIS — Z955 Presence of coronary angioplasty implant and graft: Secondary | ICD-10-CM | POA: Diagnosis not present

## 2022-03-25 DIAGNOSIS — Z853 Personal history of malignant neoplasm of breast: Secondary | ICD-10-CM | POA: Diagnosis not present

## 2022-03-25 DIAGNOSIS — M19019 Primary osteoarthritis, unspecified shoulder: Secondary | ICD-10-CM | POA: Diagnosis not present

## 2022-03-25 DIAGNOSIS — M009 Pyogenic arthritis, unspecified: Secondary | ICD-10-CM | POA: Diagnosis not present

## 2022-03-25 DIAGNOSIS — M810 Age-related osteoporosis without current pathological fracture: Secondary | ICD-10-CM | POA: Diagnosis not present

## 2022-03-25 DIAGNOSIS — Z7901 Long term (current) use of anticoagulants: Secondary | ICD-10-CM | POA: Diagnosis not present

## 2022-03-25 NOTE — Telephone Encounter (Unsigned)
Home Health Verbal Orders - Caller/Agency: Joelene Millin / [place] [facility] ?Callback Number: [phone] ?Requesting OT ?Frequency: 1x a week for 1 week  and  ?2x's a week for 4 weeks ?for ADL, home excersice, activity tolerance, fall prevention and safety /IADL and cognitions and transfer  ?

## 2022-03-26 ENCOUNTER — Ambulatory Visit: Payer: Medicare Other | Admitting: Internal Medicine

## 2022-03-26 DIAGNOSIS — Z7901 Long term (current) use of anticoagulants: Secondary | ICD-10-CM | POA: Diagnosis not present

## 2022-03-26 DIAGNOSIS — Z955 Presence of coronary angioplasty implant and graft: Secondary | ICD-10-CM | POA: Diagnosis not present

## 2022-03-26 DIAGNOSIS — M19019 Primary osteoarthritis, unspecified shoulder: Secondary | ICD-10-CM | POA: Diagnosis not present

## 2022-03-26 DIAGNOSIS — I10 Essential (primary) hypertension: Secondary | ICD-10-CM | POA: Diagnosis not present

## 2022-03-26 DIAGNOSIS — I2510 Atherosclerotic heart disease of native coronary artery without angina pectoris: Secondary | ICD-10-CM | POA: Diagnosis not present

## 2022-03-26 DIAGNOSIS — Z791 Long term (current) use of non-steroidal anti-inflammatories (NSAID): Secondary | ICD-10-CM | POA: Diagnosis not present

## 2022-03-26 DIAGNOSIS — Z853 Personal history of malignant neoplasm of breast: Secondary | ICD-10-CM | POA: Diagnosis not present

## 2022-03-26 DIAGNOSIS — M810 Age-related osteoporosis without current pathological fracture: Secondary | ICD-10-CM | POA: Diagnosis not present

## 2022-03-26 DIAGNOSIS — M009 Pyogenic arthritis, unspecified: Secondary | ICD-10-CM | POA: Diagnosis not present

## 2022-03-26 DIAGNOSIS — E7800 Pure hypercholesterolemia, unspecified: Secondary | ICD-10-CM | POA: Diagnosis not present

## 2022-03-26 DIAGNOSIS — Z951 Presence of aortocoronary bypass graft: Secondary | ICD-10-CM | POA: Diagnosis not present

## 2022-03-26 DIAGNOSIS — Z7982 Long term (current) use of aspirin: Secondary | ICD-10-CM | POA: Diagnosis not present

## 2022-03-26 DIAGNOSIS — I4819 Other persistent atrial fibrillation: Secondary | ICD-10-CM | POA: Diagnosis not present

## 2022-03-26 DIAGNOSIS — H401122 Primary open-angle glaucoma, left eye, moderate stage: Secondary | ICD-10-CM | POA: Diagnosis not present

## 2022-03-26 NOTE — Telephone Encounter (Signed)
Spoke to [place] giving verbal orders. She verbalized understanding. ? ?KP ?

## 2022-03-27 ENCOUNTER — Other Ambulatory Visit: Payer: Self-pay | Admitting: Internal Medicine

## 2022-03-29 NOTE — Telephone Encounter (Signed)
Requested Prescriptions  ?Pending Prescriptions Disp Refills  ?? metoprolol succinate (TOPROL-XL) 50 MG 24 hr tablet [Pharmacy Med Name: METOPROLOL ER SUCCINATE '50MG'$  TABS] 90 tablet 0  ?  Sig: TAKE 1 TABLET BY MOUTH EVERY NIGHT  ?  ? Cardiovascular:  Beta Blockers Failed - 03/27/2022  8:00 AM  ?  ?  Failed - Last BP in normal range  ?  BP Readings from Last 1 Encounters:  ?06/29/21 140/84  ?   ?  ?  Passed - Last Heart Rate in normal range  ?  Pulse Readings from Last 1 Encounters:  ?06/29/21 70  ?   ?  ?  Passed - Valid encounter within last 6 months  ?  Recent Outpatient Visits   ?      ? 9 months ago Essential (primary) hypertension  ? [facility] Glean Hess, MD  ? 1 year ago Pseudogout of hand, left  ? [facility] Glean Hess, MD  ? 1 year ago Essential (primary) hypertension  ? [facility] Glean Hess, MD  ? 1 year ago Pseudogout of right knee  ? [facility] Glean Hess, MD  ? 1 year ago Senile dementia, uncomplicated ([place])  ? [facility] Glean Hess, MD  ?  ?  ?Future Appointments   ?        ? In 2 weeks Army Melia Jesse Sans, MD [facility], [place]  ?  ? ?  ?  ?  ? ?

## 2022-03-30 ENCOUNTER — Other Ambulatory Visit (INDEPENDENT_AMBULATORY_CARE_PROVIDER_SITE_OTHER): Payer: Medicare Other | Admitting: Internal Medicine

## 2022-03-30 DIAGNOSIS — Z853 Personal history of malignant neoplasm of breast: Secondary | ICD-10-CM | POA: Diagnosis not present

## 2022-03-30 DIAGNOSIS — M009 Pyogenic arthritis, unspecified: Secondary | ICD-10-CM

## 2022-03-30 DIAGNOSIS — I4819 Other persistent atrial fibrillation: Secondary | ICD-10-CM

## 2022-03-30 DIAGNOSIS — Z7901 Long term (current) use of anticoagulants: Secondary | ICD-10-CM

## 2022-03-30 DIAGNOSIS — M19011 Primary osteoarthritis, right shoulder: Secondary | ICD-10-CM

## 2022-03-30 DIAGNOSIS — I10 Essential (primary) hypertension: Secondary | ICD-10-CM

## 2022-03-30 DIAGNOSIS — Z791 Long term (current) use of non-steroidal anti-inflammatories (NSAID): Secondary | ICD-10-CM | POA: Diagnosis not present

## 2022-03-30 DIAGNOSIS — E7800 Pure hypercholesterolemia, unspecified: Secondary | ICD-10-CM | POA: Diagnosis not present

## 2022-03-30 DIAGNOSIS — H401122 Primary open-angle glaucoma, left eye, moderate stage: Secondary | ICD-10-CM

## 2022-03-30 DIAGNOSIS — F0390 Unspecified dementia without behavioral disturbance: Secondary | ICD-10-CM

## 2022-03-30 DIAGNOSIS — M19012 Primary osteoarthritis, left shoulder: Secondary | ICD-10-CM

## 2022-03-30 DIAGNOSIS — Z955 Presence of coronary angioplasty implant and graft: Secondary | ICD-10-CM | POA: Diagnosis not present

## 2022-03-30 DIAGNOSIS — Z951 Presence of aortocoronary bypass graft: Secondary | ICD-10-CM | POA: Diagnosis not present

## 2022-03-30 DIAGNOSIS — M810 Age-related osteoporosis without current pathological fracture: Secondary | ICD-10-CM

## 2022-03-30 DIAGNOSIS — I2510 Atherosclerotic heart disease of native coronary artery without angina pectoris: Secondary | ICD-10-CM | POA: Diagnosis not present

## 2022-03-30 DIAGNOSIS — Z7982 Long term (current) use of aspirin: Secondary | ICD-10-CM | POA: Diagnosis not present

## 2022-03-30 DIAGNOSIS — M19019 Primary osteoarthritis, unspecified shoulder: Secondary | ICD-10-CM | POA: Diagnosis not present

## 2022-03-30 NOTE — Progress Notes (Signed)
Received home health orders orders from [facility]. ?Start of care 03/24/22.   Certification and orders from 03/24/22 through 05/22/22 are reviewed, signed and faxed back to home health company. ? ?Need of intermittent skilled services at home: due to homebound status requiring taxing effort to leave the home. ? ?The home health care plan has been established by me and will be reviewed and updated as needed to maximize patient recovery.  I certify that all home health services have been and will be furnished to the patient while under my care. ? ?Face-to-face encounter in which the need for home health services was established: at hospital discharge 03/23/22. ? ?Patient is receiving home health services for the following diagnoses: ?Problem List Items Addressed This Visit   ? ?  ? Cardiovascular and Mediastinum  ? Essential (primary) hypertension  ? Persistent atrial fibrillation ([place])  ?  ? Nervous and Auditory  ? Senile dementia, uncomplicated ([place])  ?  ? Musculoskeletal and Integument  ? Arthritis of shoulder region, degenerative  ?  ? Other  ? Personal history of malignant neoplasm of breast  ? Hypercholesteremia  ? Primary open angle glaucoma (POAG) of left eye, moderate stage  ? ?Other Visit Diagnoses   ? ? Pyogenic arthritis, lower leg (HCC)    -  Primary  ? Atherosclerosis of native coronary artery of native heart without angina pectoris      ? Osteoporosis, post-menopausal      ? Long term (current) use of anticoagulants      ? Encounter for long-term (current) use of non-steroidal anti-inflammatories      ? Postsurgical aortocoronary bypass status      ? Stented coronary artery      ? ?  ? ? ? ?Halina Maidens, MD  ? ?

## 2022-03-31 DIAGNOSIS — I2510 Atherosclerotic heart disease of native coronary artery without angina pectoris: Secondary | ICD-10-CM | POA: Diagnosis not present

## 2022-03-31 DIAGNOSIS — Z7901 Long term (current) use of anticoagulants: Secondary | ICD-10-CM | POA: Diagnosis not present

## 2022-03-31 DIAGNOSIS — M19019 Primary osteoarthritis, unspecified shoulder: Secondary | ICD-10-CM | POA: Diagnosis not present

## 2022-03-31 DIAGNOSIS — M009 Pyogenic arthritis, unspecified: Secondary | ICD-10-CM | POA: Diagnosis not present

## 2022-03-31 DIAGNOSIS — Z853 Personal history of malignant neoplasm of breast: Secondary | ICD-10-CM | POA: Diagnosis not present

## 2022-03-31 DIAGNOSIS — E7800 Pure hypercholesterolemia, unspecified: Secondary | ICD-10-CM | POA: Diagnosis not present

## 2022-03-31 DIAGNOSIS — Z955 Presence of coronary angioplasty implant and graft: Secondary | ICD-10-CM | POA: Diagnosis not present

## 2022-03-31 DIAGNOSIS — Z7982 Long term (current) use of aspirin: Secondary | ICD-10-CM | POA: Diagnosis not present

## 2022-03-31 DIAGNOSIS — Z951 Presence of aortocoronary bypass graft: Secondary | ICD-10-CM | POA: Diagnosis not present

## 2022-03-31 DIAGNOSIS — Z791 Long term (current) use of non-steroidal anti-inflammatories (NSAID): Secondary | ICD-10-CM | POA: Diagnosis not present

## 2022-03-31 DIAGNOSIS — H401122 Primary open-angle glaucoma, left eye, moderate stage: Secondary | ICD-10-CM | POA: Diagnosis not present

## 2022-03-31 DIAGNOSIS — I4819 Other persistent atrial fibrillation: Secondary | ICD-10-CM | POA: Diagnosis not present

## 2022-03-31 DIAGNOSIS — M810 Age-related osteoporosis without current pathological fracture: Secondary | ICD-10-CM | POA: Diagnosis not present

## 2022-03-31 DIAGNOSIS — I10 Essential (primary) hypertension: Secondary | ICD-10-CM | POA: Diagnosis not present

## 2022-04-01 ENCOUNTER — Other Ambulatory Visit: Payer: Self-pay | Admitting: Internal Medicine

## 2022-04-01 DIAGNOSIS — Z7901 Long term (current) use of anticoagulants: Secondary | ICD-10-CM | POA: Diagnosis not present

## 2022-04-01 DIAGNOSIS — Z955 Presence of coronary angioplasty implant and graft: Secondary | ICD-10-CM | POA: Diagnosis not present

## 2022-04-01 DIAGNOSIS — I10 Essential (primary) hypertension: Secondary | ICD-10-CM | POA: Diagnosis not present

## 2022-04-01 DIAGNOSIS — Z853 Personal history of malignant neoplasm of breast: Secondary | ICD-10-CM | POA: Diagnosis not present

## 2022-04-01 DIAGNOSIS — M19019 Primary osteoarthritis, unspecified shoulder: Secondary | ICD-10-CM | POA: Diagnosis not present

## 2022-04-01 DIAGNOSIS — Z7982 Long term (current) use of aspirin: Secondary | ICD-10-CM | POA: Diagnosis not present

## 2022-04-01 DIAGNOSIS — Z791 Long term (current) use of non-steroidal anti-inflammatories (NSAID): Secondary | ICD-10-CM | POA: Diagnosis not present

## 2022-04-01 DIAGNOSIS — Z951 Presence of aortocoronary bypass graft: Secondary | ICD-10-CM | POA: Diagnosis not present

## 2022-04-01 DIAGNOSIS — I4819 Other persistent atrial fibrillation: Secondary | ICD-10-CM | POA: Diagnosis not present

## 2022-04-01 DIAGNOSIS — M009 Pyogenic arthritis, unspecified: Secondary | ICD-10-CM | POA: Diagnosis not present

## 2022-04-01 DIAGNOSIS — H401122 Primary open-angle glaucoma, left eye, moderate stage: Secondary | ICD-10-CM | POA: Diagnosis not present

## 2022-04-01 DIAGNOSIS — E7800 Pure hypercholesterolemia, unspecified: Secondary | ICD-10-CM | POA: Diagnosis not present

## 2022-04-01 DIAGNOSIS — I2510 Atherosclerotic heart disease of native coronary artery without angina pectoris: Secondary | ICD-10-CM | POA: Diagnosis not present

## 2022-04-01 DIAGNOSIS — M810 Age-related osteoporosis without current pathological fracture: Secondary | ICD-10-CM | POA: Diagnosis not present

## 2022-04-01 MED ORDER — AMLODIPINE BESYLATE 5 MG PO TABS: ORAL_TABLET | ORAL | 0 refills | Status: DC

## 2022-04-01 NOTE — Telephone Encounter (Signed)
Requested Prescriptions  ?Pending Prescriptions Disp Refills  ?? amLODipine (NORVASC) 5 MG tablet 180 tablet 0  ?  Sig: TAKE 1 TABLET(5 MG) BY MOUTH TWICE DAILY  ?  ? Cardiovascular: Calcium Channel Blockers 2 Failed - 04/01/2022 11:26 AM  ?  ?  Failed - Last BP in normal range  ?  BP Readings from Last 1 Encounters:  ?06/29/21 140/84  ?   ?  ?  Passed - Last Heart Rate in normal range  ?  Pulse Readings from Last 1 Encounters:  ?06/29/21 70  ?   ?  ?  Passed - Valid encounter within last 6 months  ?  Recent Outpatient Visits   ?      ? 9 months ago Essential (primary) hypertension  ? [facility] Glean Hess, MD  ? 1 year ago Pseudogout of hand, left  ? [facility] Glean Hess, MD  ? 1 year ago Essential (primary) hypertension  ? [facility] Glean Hess, MD  ? 1 year ago Pseudogout of right knee  ? [facility] Glean Hess, MD  ? 1 year ago Senile dementia, uncomplicated ([place])  ? [facility] Glean Hess, MD  ?  ?  ?Future Appointments   ?        ? In 2 weeks Army Melia Jesse Sans, MD [facility], [place]  ?  ? ?  ?  ?  ? ?

## 2022-04-01 NOTE — Telephone Encounter (Signed)
Medication Refill - Medication: amLODipine (NORVASC) 5 MG tablet ? ?Has the patient contacted their pharmacy? No  ? ?(Agent: If no, request that the patient contact the pharmacy for the refill. If patient does not wish to contact the pharmacy document the reason why and proceed with request.)Caller states patient has no refills and has 1 week left ? ? ? ?Preferred Pharmacy (with phone number or street name): ? ?[place] [place] [address] Phone:  [phone]  ?Fax:  [phone]  ?  ? ?Has the patient been seen for an appointment in the last year OR does the patient have an upcoming appointment? Yes.   ? ?Agent: Please be advised that RX refills may take up to 3 business days. We ask that you follow-up with your pharmacy. ?

## 2022-04-02 DIAGNOSIS — M009 Pyogenic arthritis, unspecified: Secondary | ICD-10-CM | POA: Diagnosis not present

## 2022-04-02 DIAGNOSIS — Z951 Presence of aortocoronary bypass graft: Secondary | ICD-10-CM | POA: Diagnosis not present

## 2022-04-02 DIAGNOSIS — M810 Age-related osteoporosis without current pathological fracture: Secondary | ICD-10-CM | POA: Diagnosis not present

## 2022-04-02 DIAGNOSIS — I2510 Atherosclerotic heart disease of native coronary artery without angina pectoris: Secondary | ICD-10-CM | POA: Diagnosis not present

## 2022-04-02 DIAGNOSIS — Z7982 Long term (current) use of aspirin: Secondary | ICD-10-CM | POA: Diagnosis not present

## 2022-04-02 DIAGNOSIS — E7800 Pure hypercholesterolemia, unspecified: Secondary | ICD-10-CM | POA: Diagnosis not present

## 2022-04-02 DIAGNOSIS — Z791 Long term (current) use of non-steroidal anti-inflammatories (NSAID): Secondary | ICD-10-CM | POA: Diagnosis not present

## 2022-04-02 DIAGNOSIS — Z853 Personal history of malignant neoplasm of breast: Secondary | ICD-10-CM | POA: Diagnosis not present

## 2022-04-02 DIAGNOSIS — M19019 Primary osteoarthritis, unspecified shoulder: Secondary | ICD-10-CM | POA: Diagnosis not present

## 2022-04-02 DIAGNOSIS — Z7901 Long term (current) use of anticoagulants: Secondary | ICD-10-CM | POA: Diagnosis not present

## 2022-04-02 DIAGNOSIS — I10 Essential (primary) hypertension: Secondary | ICD-10-CM | POA: Diagnosis not present

## 2022-04-02 DIAGNOSIS — H401122 Primary open-angle glaucoma, left eye, moderate stage: Secondary | ICD-10-CM | POA: Diagnosis not present

## 2022-04-02 DIAGNOSIS — Z955 Presence of coronary angioplasty implant and graft: Secondary | ICD-10-CM | POA: Diagnosis not present

## 2022-04-02 DIAGNOSIS — I4819 Other persistent atrial fibrillation: Secondary | ICD-10-CM | POA: Diagnosis not present

## 2022-04-05 DIAGNOSIS — I10 Essential (primary) hypertension: Secondary | ICD-10-CM | POA: Diagnosis not present

## 2022-04-05 DIAGNOSIS — I4819 Other persistent atrial fibrillation: Secondary | ICD-10-CM | POA: Diagnosis not present

## 2022-04-05 DIAGNOSIS — Z791 Long term (current) use of non-steroidal anti-inflammatories (NSAID): Secondary | ICD-10-CM | POA: Diagnosis not present

## 2022-04-05 DIAGNOSIS — H401122 Primary open-angle glaucoma, left eye, moderate stage: Secondary | ICD-10-CM | POA: Diagnosis not present

## 2022-04-05 DIAGNOSIS — I2510 Atherosclerotic heart disease of native coronary artery without angina pectoris: Secondary | ICD-10-CM | POA: Diagnosis not present

## 2022-04-05 DIAGNOSIS — M810 Age-related osteoporosis without current pathological fracture: Secondary | ICD-10-CM | POA: Diagnosis not present

## 2022-04-05 DIAGNOSIS — M009 Pyogenic arthritis, unspecified: Secondary | ICD-10-CM | POA: Diagnosis not present

## 2022-04-05 DIAGNOSIS — E7800 Pure hypercholesterolemia, unspecified: Secondary | ICD-10-CM | POA: Diagnosis not present

## 2022-04-05 DIAGNOSIS — M19019 Primary osteoarthritis, unspecified shoulder: Secondary | ICD-10-CM | POA: Diagnosis not present

## 2022-04-05 DIAGNOSIS — Z955 Presence of coronary angioplasty implant and graft: Secondary | ICD-10-CM | POA: Diagnosis not present

## 2022-04-05 DIAGNOSIS — Z7901 Long term (current) use of anticoagulants: Secondary | ICD-10-CM | POA: Diagnosis not present

## 2022-04-05 DIAGNOSIS — Z7982 Long term (current) use of aspirin: Secondary | ICD-10-CM | POA: Diagnosis not present

## 2022-04-05 DIAGNOSIS — Z853 Personal history of malignant neoplasm of breast: Secondary | ICD-10-CM | POA: Diagnosis not present

## 2022-04-05 DIAGNOSIS — Z951 Presence of aortocoronary bypass graft: Secondary | ICD-10-CM | POA: Diagnosis not present

## 2022-04-06 DIAGNOSIS — Z7982 Long term (current) use of aspirin: Secondary | ICD-10-CM | POA: Diagnosis not present

## 2022-04-06 DIAGNOSIS — Z8673 Personal history of transient ischemic attack (TIA), and cerebral infarction without residual deficits: Secondary | ICD-10-CM | POA: Diagnosis not present

## 2022-04-06 DIAGNOSIS — Z7901 Long term (current) use of anticoagulants: Secondary | ICD-10-CM | POA: Diagnosis not present

## 2022-04-06 DIAGNOSIS — Z79899 Other long term (current) drug therapy: Secondary | ICD-10-CM | POA: Diagnosis not present

## 2022-04-06 DIAGNOSIS — M19019 Primary osteoarthritis, unspecified shoulder: Secondary | ICD-10-CM | POA: Diagnosis not present

## 2022-04-06 DIAGNOSIS — M5030 Other cervical disc degeneration, unspecified cervical region: Secondary | ICD-10-CM | POA: Diagnosis not present

## 2022-04-06 DIAGNOSIS — M009 Pyogenic arthritis, unspecified: Secondary | ICD-10-CM | POA: Diagnosis not present

## 2022-04-06 DIAGNOSIS — M810 Age-related osteoporosis without current pathological fracture: Secondary | ICD-10-CM | POA: Diagnosis not present

## 2022-04-06 DIAGNOSIS — I4819 Other persistent atrial fibrillation: Secondary | ICD-10-CM | POA: Diagnosis not present

## 2022-04-06 DIAGNOSIS — Z87891 Personal history of nicotine dependence: Secondary | ICD-10-CM | POA: Diagnosis not present

## 2022-04-06 DIAGNOSIS — M50322 Other cervical disc degeneration at C5-C6 level: Secondary | ICD-10-CM | POA: Diagnosis not present

## 2022-04-06 DIAGNOSIS — Z853 Personal history of malignant neoplasm of breast: Secondary | ICD-10-CM | POA: Diagnosis not present

## 2022-04-06 DIAGNOSIS — Z955 Presence of coronary angioplasty implant and graft: Secondary | ICD-10-CM | POA: Diagnosis not present

## 2022-04-06 DIAGNOSIS — Z791 Long term (current) use of non-steroidal anti-inflammatories (NSAID): Secondary | ICD-10-CM | POA: Diagnosis not present

## 2022-04-06 DIAGNOSIS — I10 Essential (primary) hypertension: Secondary | ICD-10-CM | POA: Diagnosis not present

## 2022-04-06 DIAGNOSIS — I6782 Cerebral ischemia: Secondary | ICD-10-CM | POA: Diagnosis not present

## 2022-04-06 DIAGNOSIS — Z951 Presence of aortocoronary bypass graft: Secondary | ICD-10-CM | POA: Diagnosis not present

## 2022-04-06 DIAGNOSIS — I4891 Unspecified atrial fibrillation: Secondary | ICD-10-CM | POA: Diagnosis not present

## 2022-04-06 DIAGNOSIS — R791 Abnormal coagulation profile: Secondary | ICD-10-CM | POA: Diagnosis not present

## 2022-04-06 DIAGNOSIS — I2510 Atherosclerotic heart disease of native coronary artery without angina pectoris: Secondary | ICD-10-CM | POA: Diagnosis not present

## 2022-04-06 DIAGNOSIS — I4510 Unspecified right bundle-branch block: Secondary | ICD-10-CM | POA: Diagnosis not present

## 2022-04-06 DIAGNOSIS — S0003XA Contusion of scalp, initial encounter: Secondary | ICD-10-CM | POA: Diagnosis not present

## 2022-04-06 DIAGNOSIS — R9431 Abnormal electrocardiogram [ECG] [EKG]: Secondary | ICD-10-CM | POA: Diagnosis not present

## 2022-04-06 DIAGNOSIS — S199XXA Unspecified injury of neck, initial encounter: Secondary | ICD-10-CM | POA: Diagnosis not present

## 2022-04-06 DIAGNOSIS — S0990XA Unspecified injury of head, initial encounter: Secondary | ICD-10-CM | POA: Diagnosis not present

## 2022-04-06 DIAGNOSIS — H401122 Primary open-angle glaucoma, left eye, moderate stage: Secondary | ICD-10-CM | POA: Diagnosis not present

## 2022-04-06 DIAGNOSIS — M47812 Spondylosis without myelopathy or radiculopathy, cervical region: Secondary | ICD-10-CM | POA: Diagnosis not present

## 2022-04-06 DIAGNOSIS — E7800 Pure hypercholesterolemia, unspecified: Secondary | ICD-10-CM | POA: Diagnosis not present

## 2022-04-06 DIAGNOSIS — I639 Cerebral infarction, unspecified: Secondary | ICD-10-CM | POA: Diagnosis not present

## 2022-04-06 DIAGNOSIS — M47813 Spondylosis without myelopathy or radiculopathy, cervicothoracic region: Secondary | ICD-10-CM | POA: Diagnosis not present

## 2022-04-06 DIAGNOSIS — E860 Dehydration: Secondary | ICD-10-CM | POA: Diagnosis not present

## 2022-04-07 DIAGNOSIS — M47812 Spondylosis without myelopathy or radiculopathy, cervical region: Secondary | ICD-10-CM | POA: Diagnosis not present

## 2022-04-07 DIAGNOSIS — M5030 Other cervical disc degeneration, unspecified cervical region: Secondary | ICD-10-CM | POA: Diagnosis not present

## 2022-04-07 DIAGNOSIS — I639 Cerebral infarction, unspecified: Secondary | ICD-10-CM | POA: Diagnosis not present

## 2022-04-07 DIAGNOSIS — I6782 Cerebral ischemia: Secondary | ICD-10-CM | POA: Diagnosis not present

## 2022-04-07 DIAGNOSIS — S0003XA Contusion of scalp, initial encounter: Secondary | ICD-10-CM | POA: Diagnosis not present

## 2022-04-07 DIAGNOSIS — S199XXA Unspecified injury of neck, initial encounter: Secondary | ICD-10-CM | POA: Diagnosis not present

## 2022-04-08 ENCOUNTER — Other Ambulatory Visit: Payer: Self-pay | Admitting: Internal Medicine

## 2022-04-08 DIAGNOSIS — I4891 Unspecified atrial fibrillation: Secondary | ICD-10-CM | POA: Diagnosis not present

## 2022-04-08 DIAGNOSIS — I4819 Other persistent atrial fibrillation: Secondary | ICD-10-CM | POA: Diagnosis not present

## 2022-04-08 DIAGNOSIS — I2510 Atherosclerotic heart disease of native coronary artery without angina pectoris: Secondary | ICD-10-CM | POA: Diagnosis not present

## 2022-04-08 DIAGNOSIS — Z853 Personal history of malignant neoplasm of breast: Secondary | ICD-10-CM | POA: Diagnosis not present

## 2022-04-08 DIAGNOSIS — I10 Essential (primary) hypertension: Secondary | ICD-10-CM | POA: Diagnosis not present

## 2022-04-08 DIAGNOSIS — Z955 Presence of coronary angioplasty implant and graft: Secondary | ICD-10-CM | POA: Diagnosis not present

## 2022-04-08 DIAGNOSIS — Z791 Long term (current) use of non-steroidal anti-inflammatories (NSAID): Secondary | ICD-10-CM | POA: Diagnosis not present

## 2022-04-08 DIAGNOSIS — I4519 Other right bundle-branch block: Secondary | ICD-10-CM | POA: Diagnosis not present

## 2022-04-08 DIAGNOSIS — Z7901 Long term (current) use of anticoagulants: Secondary | ICD-10-CM | POA: Diagnosis not present

## 2022-04-08 DIAGNOSIS — M009 Pyogenic arthritis, unspecified: Secondary | ICD-10-CM | POA: Diagnosis not present

## 2022-04-08 DIAGNOSIS — M19019 Primary osteoarthritis, unspecified shoulder: Secondary | ICD-10-CM | POA: Diagnosis not present

## 2022-04-08 DIAGNOSIS — R9431 Abnormal electrocardiogram [ECG] [EKG]: Secondary | ICD-10-CM | POA: Diagnosis not present

## 2022-04-08 DIAGNOSIS — Z951 Presence of aortocoronary bypass graft: Secondary | ICD-10-CM | POA: Diagnosis not present

## 2022-04-08 DIAGNOSIS — M810 Age-related osteoporosis without current pathological fracture: Secondary | ICD-10-CM | POA: Diagnosis not present

## 2022-04-08 DIAGNOSIS — H401122 Primary open-angle glaucoma, left eye, moderate stage: Secondary | ICD-10-CM | POA: Diagnosis not present

## 2022-04-08 DIAGNOSIS — E7800 Pure hypercholesterolemia, unspecified: Secondary | ICD-10-CM | POA: Diagnosis not present

## 2022-04-08 DIAGNOSIS — Z7982 Long term (current) use of aspirin: Secondary | ICD-10-CM | POA: Diagnosis not present

## 2022-04-08 NOTE — Telephone Encounter (Signed)
Requested medication (s) are due for refill today - no ? ?Requested medication (s) are on the active medication list -yes ? ?Future visit scheduled -yes ? ?Last refill: 04/08/22 ? ?Notes to clinic: Unable to grant 90 day supply per protocol- 30 day supply filled by office today-request sent for review  ? ?Requested Prescriptions  ?Pending Prescriptions Disp Refills  ? losartan (COZAAR) 25 MG tablet [Pharmacy Med Name: LOSARTAN '25MG'$  TABLETS] 90 tablet   ?  Sig: TAKE 1 TABLET(25 MG) BY MOUTH DAILY  ?  ? Cardiovascular:  Angiotensin Receptor Blockers Failed - 04/08/2022 11:38 AM  ?  ?  Failed - Cr in normal range and within 180 days  ?  Creatinine, Ser  ?Date Value Ref Range Status  ?06/17/2021 0.83 0.44 - 1.00 mg/dL Final  ?   ?  ?  Failed - K in normal range and within 180 days  ?  Potassium  ?Date Value Ref Range Status  ?06/17/2021 3.5 3.5 - 5.1 mmol/L Final  ?   ?  ?  Failed - Last BP in normal range  ?  BP Readings from Last 1 Encounters:  ?06/29/21 140/84  ?   ?  ?  Passed - Patient is not pregnant  ?  ?  Passed - Valid encounter within last 6 months  ?  Recent Outpatient Visits   ? ?      ? 9 months ago Essential (primary) hypertension  ? [facility] Glean Hess, MD  ? 1 year ago Pseudogout of hand, left  ? [facility] Glean Hess, MD  ? 1 year ago Essential (primary) hypertension  ? [facility] Glean Hess, MD  ? 1 year ago Pseudogout of right knee  ? [facility] Glean Hess, MD  ? 1 year ago Senile dementia, uncomplicated ([place])  ? [facility] Glean Hess, MD  ? ?  ?  ?Future Appointments   ? ?        ? In 1 week Glean Hess, MD [facility], [place]  ? ?  ? ? ?  ?  ?  ? ? ? ?Requested Prescriptions  ?Pending Prescriptions Disp Refills  ? losartan (COZAAR) 25 MG tablet [Pharmacy Med Name: LOSARTAN '25MG'$  TABLETS] 90 tablet   ?  Sig: TAKE 1 TABLET(25 MG) BY MOUTH DAILY  ?  ? Cardiovascular:  Angiotensin Receptor Blockers  Failed - 04/08/2022 11:38 AM  ?  ?  Failed - Cr in normal range and within 180 days  ?  Creatinine, Ser  ?Date Value Ref Range Status  ?06/17/2021 0.83 0.44 - 1.00 mg/dL Final  ?   ?  ?  Failed - K in normal range and within 180 days  ?  Potassium  ?Date Value Ref Range Status  ?06/17/2021 3.5 3.5 - 5.1 mmol/L Final  ?   ?  ?  Failed - Last BP in normal range  ?  BP Readings from Last 1 Encounters:  ?06/29/21 140/84  ?   ?  ?  Passed - Patient is not pregnant  ?  ?  Passed - Valid encounter within last 6 months  ?  Recent Outpatient Visits   ? ?      ? 9 months ago Essential (primary) hypertension  ? [facility] Glean Hess, MD  ? 1 year ago Pseudogout of hand, left  ? [facility] Glean Hess, MD  ? 1 year  ago Essential (primary) hypertension  ? [facility] Glean Hess, MD  ? 1 year ago Pseudogout of right knee  ? [facility] Glean Hess, MD  ? 1 year ago Senile dementia, uncomplicated ([place])  ? [facility] Glean Hess, MD  ? ?  ?  ?Future Appointments   ? ?        ? In 1 week Glean Hess, MD [facility], [place]  ? ?  ? ? ?  ?  ?  ? ? ? ?

## 2022-04-08 NOTE — Telephone Encounter (Signed)
Requested medication (s) are due for refill today: yes ? ?Requested medication (s) are on the active medication list: yes ? ?Last refill:  03/05/22 #30/0 ? ?Future visit scheduled: yes ? ?Notes to clinic:  Unable to refill per protocol due to failed labs, no updated results. ? ? ?  ?Requested Prescriptions  ?Pending Prescriptions Disp Refills  ? losartan (COZAAR) 25 MG tablet [Pharmacy Med Name: LOSARTAN '25MG'$  TABLETS] 30 tablet 0  ?  Sig: TAKE 1 TABLET(25 MG) BY MOUTH DAILY  ?  ? Cardiovascular:  Angiotensin Receptor Blockers Failed - 04/08/2022 10:36 AM  ?  ?  Failed - Cr in normal range and within 180 days  ?  Creatinine, Ser  ?Date Value Ref Range Status  ?06/17/2021 0.83 0.44 - 1.00 mg/dL Final  ?   ?  ?  Failed - K in normal range and within 180 days  ?  Potassium  ?Date Value Ref Range Status  ?06/17/2021 3.5 3.5 - 5.1 mmol/L Final  ?   ?  ?  Failed - Last BP in normal range  ?  BP Readings from Last 1 Encounters:  ?06/29/21 140/84  ?   ?  ?  Passed - Patient is not pregnant  ?  ?  Passed - Valid encounter within last 6 months  ?  Recent Outpatient Visits   ? ?      ? 9 months ago Essential (primary) hypertension  ? [facility] Glean Hess, MD  ? 1 year ago Pseudogout of hand, left  ? [facility] Glean Hess, MD  ? 1 year ago Essential (primary) hypertension  ? [facility] Glean Hess, MD  ? 1 year ago Pseudogout of right knee  ? [facility] Glean Hess, MD  ? 1 year ago Senile dementia, uncomplicated ([place])  ? [facility] Glean Hess, MD  ? ?  ?  ?Future Appointments   ? ?        ? In 1 week Glean Hess, MD [facility], [place]  ? ?  ? ? ?  ?  ?  ? ?

## 2022-04-09 DIAGNOSIS — I2510 Atherosclerotic heart disease of native coronary artery without angina pectoris: Secondary | ICD-10-CM | POA: Diagnosis not present

## 2022-04-09 DIAGNOSIS — M009 Pyogenic arthritis, unspecified: Secondary | ICD-10-CM | POA: Diagnosis not present

## 2022-04-09 DIAGNOSIS — E7800 Pure hypercholesterolemia, unspecified: Secondary | ICD-10-CM | POA: Diagnosis not present

## 2022-04-09 DIAGNOSIS — I4819 Other persistent atrial fibrillation: Secondary | ICD-10-CM | POA: Diagnosis not present

## 2022-04-09 DIAGNOSIS — Z791 Long term (current) use of non-steroidal anti-inflammatories (NSAID): Secondary | ICD-10-CM | POA: Diagnosis not present

## 2022-04-09 DIAGNOSIS — H401122 Primary open-angle glaucoma, left eye, moderate stage: Secondary | ICD-10-CM | POA: Diagnosis not present

## 2022-04-09 DIAGNOSIS — M810 Age-related osteoporosis without current pathological fracture: Secondary | ICD-10-CM | POA: Diagnosis not present

## 2022-04-09 DIAGNOSIS — Z7982 Long term (current) use of aspirin: Secondary | ICD-10-CM | POA: Diagnosis not present

## 2022-04-09 DIAGNOSIS — M19019 Primary osteoarthritis, unspecified shoulder: Secondary | ICD-10-CM | POA: Diagnosis not present

## 2022-04-09 DIAGNOSIS — Z955 Presence of coronary angioplasty implant and graft: Secondary | ICD-10-CM | POA: Diagnosis not present

## 2022-04-09 DIAGNOSIS — Z951 Presence of aortocoronary bypass graft: Secondary | ICD-10-CM | POA: Diagnosis not present

## 2022-04-09 DIAGNOSIS — Z7901 Long term (current) use of anticoagulants: Secondary | ICD-10-CM | POA: Diagnosis not present

## 2022-04-09 DIAGNOSIS — I10 Essential (primary) hypertension: Secondary | ICD-10-CM | POA: Diagnosis not present

## 2022-04-09 DIAGNOSIS — Z853 Personal history of malignant neoplasm of breast: Secondary | ICD-10-CM | POA: Diagnosis not present

## 2022-04-12 DIAGNOSIS — M19019 Primary osteoarthritis, unspecified shoulder: Secondary | ICD-10-CM | POA: Diagnosis not present

## 2022-04-12 DIAGNOSIS — Z955 Presence of coronary angioplasty implant and graft: Secondary | ICD-10-CM | POA: Diagnosis not present

## 2022-04-12 DIAGNOSIS — M009 Pyogenic arthritis, unspecified: Secondary | ICD-10-CM | POA: Diagnosis not present

## 2022-04-12 DIAGNOSIS — Z7901 Long term (current) use of anticoagulants: Secondary | ICD-10-CM | POA: Diagnosis not present

## 2022-04-12 DIAGNOSIS — Z853 Personal history of malignant neoplasm of breast: Secondary | ICD-10-CM | POA: Diagnosis not present

## 2022-04-12 DIAGNOSIS — Z951 Presence of aortocoronary bypass graft: Secondary | ICD-10-CM | POA: Diagnosis not present

## 2022-04-12 DIAGNOSIS — I2510 Atherosclerotic heart disease of native coronary artery without angina pectoris: Secondary | ICD-10-CM | POA: Diagnosis not present

## 2022-04-12 DIAGNOSIS — E7800 Pure hypercholesterolemia, unspecified: Secondary | ICD-10-CM | POA: Diagnosis not present

## 2022-04-12 DIAGNOSIS — I4819 Other persistent atrial fibrillation: Secondary | ICD-10-CM | POA: Diagnosis not present

## 2022-04-12 DIAGNOSIS — I10 Essential (primary) hypertension: Secondary | ICD-10-CM | POA: Diagnosis not present

## 2022-04-12 DIAGNOSIS — H401122 Primary open-angle glaucoma, left eye, moderate stage: Secondary | ICD-10-CM | POA: Diagnosis not present

## 2022-04-12 DIAGNOSIS — M810 Age-related osteoporosis without current pathological fracture: Secondary | ICD-10-CM | POA: Diagnosis not present

## 2022-04-12 DIAGNOSIS — Z791 Long term (current) use of non-steroidal anti-inflammatories (NSAID): Secondary | ICD-10-CM | POA: Diagnosis not present

## 2022-04-12 DIAGNOSIS — Z7982 Long term (current) use of aspirin: Secondary | ICD-10-CM | POA: Diagnosis not present

## 2022-04-13 DIAGNOSIS — Z7982 Long term (current) use of aspirin: Secondary | ICD-10-CM | POA: Diagnosis not present

## 2022-04-13 DIAGNOSIS — E7800 Pure hypercholesterolemia, unspecified: Secondary | ICD-10-CM | POA: Diagnosis not present

## 2022-04-13 DIAGNOSIS — R110 Nausea: Secondary | ICD-10-CM | POA: Diagnosis not present

## 2022-04-13 DIAGNOSIS — S064X0A Epidural hemorrhage without loss of consciousness, initial encounter: Secondary | ICD-10-CM | POA: Diagnosis not present

## 2022-04-13 DIAGNOSIS — Z791 Long term (current) use of non-steroidal anti-inflammatories (NSAID): Secondary | ICD-10-CM | POA: Diagnosis not present

## 2022-04-13 DIAGNOSIS — I2510 Atherosclerotic heart disease of native coronary artery without angina pectoris: Secondary | ICD-10-CM | POA: Diagnosis not present

## 2022-04-13 DIAGNOSIS — M25551 Pain in right hip: Secondary | ICD-10-CM | POA: Diagnosis not present

## 2022-04-13 DIAGNOSIS — Z043 Encounter for examination and observation following other accident: Secondary | ICD-10-CM | POA: Diagnosis not present

## 2022-04-13 DIAGNOSIS — S0003XA Contusion of scalp, initial encounter: Secondary | ICD-10-CM | POA: Diagnosis not present

## 2022-04-13 DIAGNOSIS — M542 Cervicalgia: Secondary | ICD-10-CM | POA: Diagnosis not present

## 2022-04-13 DIAGNOSIS — M810 Age-related osteoporosis without current pathological fracture: Secondary | ICD-10-CM | POA: Diagnosis not present

## 2022-04-13 DIAGNOSIS — W19XXXA Unspecified fall, initial encounter: Secondary | ICD-10-CM | POA: Diagnosis not present

## 2022-04-13 DIAGNOSIS — Z7901 Long term (current) use of anticoagulants: Secondary | ICD-10-CM | POA: Diagnosis not present

## 2022-04-13 DIAGNOSIS — S2239XA Fracture of one rib, unspecified side, initial encounter for closed fracture: Secondary | ICD-10-CM | POA: Diagnosis not present

## 2022-04-13 DIAGNOSIS — M19019 Primary osteoarthritis, unspecified shoulder: Secondary | ICD-10-CM | POA: Diagnosis not present

## 2022-04-13 DIAGNOSIS — Z79899 Other long term (current) drug therapy: Secondary | ICD-10-CM | POA: Diagnosis not present

## 2022-04-13 DIAGNOSIS — J811 Chronic pulmonary edema: Secondary | ICD-10-CM | POA: Diagnosis not present

## 2022-04-13 DIAGNOSIS — M25559 Pain in unspecified hip: Secondary | ICD-10-CM | POA: Diagnosis not present

## 2022-04-13 DIAGNOSIS — I4891 Unspecified atrial fibrillation: Secondary | ICD-10-CM | POA: Diagnosis not present

## 2022-04-13 DIAGNOSIS — Z955 Presence of coronary angioplasty implant and graft: Secondary | ICD-10-CM | POA: Diagnosis not present

## 2022-04-13 DIAGNOSIS — M25552 Pain in left hip: Secondary | ICD-10-CM | POA: Diagnosis not present

## 2022-04-13 DIAGNOSIS — I4819 Other persistent atrial fibrillation: Secondary | ICD-10-CM | POA: Diagnosis not present

## 2022-04-13 DIAGNOSIS — R52 Pain, unspecified: Secondary | ICD-10-CM | POA: Diagnosis not present

## 2022-04-13 DIAGNOSIS — H401122 Primary open-angle glaucoma, left eye, moderate stage: Secondary | ICD-10-CM | POA: Diagnosis not present

## 2022-04-13 DIAGNOSIS — R8279 Other abnormal findings on microbiological examination of urine: Secondary | ICD-10-CM | POA: Diagnosis not present

## 2022-04-13 DIAGNOSIS — M009 Pyogenic arthritis, unspecified: Secondary | ICD-10-CM | POA: Diagnosis not present

## 2022-04-13 DIAGNOSIS — Z951 Presence of aortocoronary bypass graft: Secondary | ICD-10-CM | POA: Diagnosis not present

## 2022-04-13 DIAGNOSIS — Z853 Personal history of malignant neoplasm of breast: Secondary | ICD-10-CM | POA: Diagnosis not present

## 2022-04-13 DIAGNOSIS — I10 Essential (primary) hypertension: Secondary | ICD-10-CM | POA: Diagnosis not present

## 2022-04-13 DIAGNOSIS — J90 Pleural effusion, not elsewhere classified: Secondary | ICD-10-CM | POA: Diagnosis not present

## 2022-04-13 DIAGNOSIS — R404 Transient alteration of awareness: Secondary | ICD-10-CM | POA: Diagnosis not present

## 2022-04-14 DIAGNOSIS — Z7982 Long term (current) use of aspirin: Secondary | ICD-10-CM | POA: Diagnosis not present

## 2022-04-14 DIAGNOSIS — Z791 Long term (current) use of non-steroidal anti-inflammatories (NSAID): Secondary | ICD-10-CM | POA: Diagnosis not present

## 2022-04-14 DIAGNOSIS — Z043 Encounter for examination and observation following other accident: Secondary | ICD-10-CM | POA: Diagnosis not present

## 2022-04-14 DIAGNOSIS — M009 Pyogenic arthritis, unspecified: Secondary | ICD-10-CM | POA: Diagnosis not present

## 2022-04-14 DIAGNOSIS — I2510 Atherosclerotic heart disease of native coronary artery without angina pectoris: Secondary | ICD-10-CM | POA: Diagnosis not present

## 2022-04-14 DIAGNOSIS — Z951 Presence of aortocoronary bypass graft: Secondary | ICD-10-CM | POA: Diagnosis not present

## 2022-04-14 DIAGNOSIS — S2239XA Fracture of one rib, unspecified side, initial encounter for closed fracture: Secondary | ICD-10-CM | POA: Diagnosis not present

## 2022-04-14 DIAGNOSIS — I10 Essential (primary) hypertension: Secondary | ICD-10-CM | POA: Diagnosis not present

## 2022-04-14 DIAGNOSIS — M810 Age-related osteoporosis without current pathological fracture: Secondary | ICD-10-CM | POA: Diagnosis not present

## 2022-04-14 DIAGNOSIS — Z955 Presence of coronary angioplasty implant and graft: Secondary | ICD-10-CM | POA: Diagnosis not present

## 2022-04-14 DIAGNOSIS — J811 Chronic pulmonary edema: Secondary | ICD-10-CM | POA: Diagnosis not present

## 2022-04-14 DIAGNOSIS — Z853 Personal history of malignant neoplasm of breast: Secondary | ICD-10-CM | POA: Diagnosis not present

## 2022-04-14 DIAGNOSIS — I4819 Other persistent atrial fibrillation: Secondary | ICD-10-CM | POA: Diagnosis not present

## 2022-04-14 DIAGNOSIS — J90 Pleural effusion, not elsewhere classified: Secondary | ICD-10-CM | POA: Diagnosis not present

## 2022-04-14 DIAGNOSIS — S0003XA Contusion of scalp, initial encounter: Secondary | ICD-10-CM | POA: Diagnosis not present

## 2022-04-14 DIAGNOSIS — H401122 Primary open-angle glaucoma, left eye, moderate stage: Secondary | ICD-10-CM | POA: Diagnosis not present

## 2022-04-14 DIAGNOSIS — M19019 Primary osteoarthritis, unspecified shoulder: Secondary | ICD-10-CM | POA: Diagnosis not present

## 2022-04-14 DIAGNOSIS — Z7901 Long term (current) use of anticoagulants: Secondary | ICD-10-CM | POA: Diagnosis not present

## 2022-04-14 DIAGNOSIS — E7800 Pure hypercholesterolemia, unspecified: Secondary | ICD-10-CM | POA: Diagnosis not present

## 2022-04-15 DIAGNOSIS — I10 Essential (primary) hypertension: Secondary | ICD-10-CM | POA: Diagnosis not present

## 2022-04-15 DIAGNOSIS — M009 Pyogenic arthritis, unspecified: Secondary | ICD-10-CM | POA: Diagnosis not present

## 2022-04-15 DIAGNOSIS — Z7982 Long term (current) use of aspirin: Secondary | ICD-10-CM | POA: Diagnosis not present

## 2022-04-15 DIAGNOSIS — I4819 Other persistent atrial fibrillation: Secondary | ICD-10-CM | POA: Diagnosis not present

## 2022-04-15 DIAGNOSIS — I2510 Atherosclerotic heart disease of native coronary artery without angina pectoris: Secondary | ICD-10-CM | POA: Diagnosis not present

## 2022-04-15 DIAGNOSIS — Z951 Presence of aortocoronary bypass graft: Secondary | ICD-10-CM | POA: Diagnosis not present

## 2022-04-15 DIAGNOSIS — Z791 Long term (current) use of non-steroidal anti-inflammatories (NSAID): Secondary | ICD-10-CM | POA: Diagnosis not present

## 2022-04-15 DIAGNOSIS — Z955 Presence of coronary angioplasty implant and graft: Secondary | ICD-10-CM | POA: Diagnosis not present

## 2022-04-15 DIAGNOSIS — E7800 Pure hypercholesterolemia, unspecified: Secondary | ICD-10-CM | POA: Diagnosis not present

## 2022-04-15 DIAGNOSIS — M810 Age-related osteoporosis without current pathological fracture: Secondary | ICD-10-CM | POA: Diagnosis not present

## 2022-04-15 DIAGNOSIS — I4891 Unspecified atrial fibrillation: Secondary | ICD-10-CM | POA: Diagnosis not present

## 2022-04-15 DIAGNOSIS — M19019 Primary osteoarthritis, unspecified shoulder: Secondary | ICD-10-CM | POA: Diagnosis not present

## 2022-04-15 DIAGNOSIS — H401122 Primary open-angle glaucoma, left eye, moderate stage: Secondary | ICD-10-CM | POA: Diagnosis not present

## 2022-04-15 DIAGNOSIS — Z853 Personal history of malignant neoplasm of breast: Secondary | ICD-10-CM | POA: Diagnosis not present

## 2022-04-15 DIAGNOSIS — Z7901 Long term (current) use of anticoagulants: Secondary | ICD-10-CM | POA: Diagnosis not present

## 2022-04-16 ENCOUNTER — Other Ambulatory Visit: Payer: Self-pay | Admitting: Internal Medicine

## 2022-04-16 ENCOUNTER — Ambulatory Visit: Payer: Medicare Other | Admitting: Internal Medicine

## 2022-04-16 DIAGNOSIS — Z791 Long term (current) use of non-steroidal anti-inflammatories (NSAID): Secondary | ICD-10-CM | POA: Diagnosis not present

## 2022-04-16 DIAGNOSIS — I2510 Atherosclerotic heart disease of native coronary artery without angina pectoris: Secondary | ICD-10-CM

## 2022-04-16 DIAGNOSIS — H401122 Primary open-angle glaucoma, left eye, moderate stage: Secondary | ICD-10-CM | POA: Diagnosis not present

## 2022-04-16 DIAGNOSIS — I4819 Other persistent atrial fibrillation: Secondary | ICD-10-CM | POA: Diagnosis not present

## 2022-04-16 DIAGNOSIS — M009 Pyogenic arthritis, unspecified: Secondary | ICD-10-CM | POA: Diagnosis not present

## 2022-04-16 DIAGNOSIS — I10 Essential (primary) hypertension: Secondary | ICD-10-CM | POA: Diagnosis not present

## 2022-04-16 DIAGNOSIS — M810 Age-related osteoporosis without current pathological fracture: Secondary | ICD-10-CM | POA: Diagnosis not present

## 2022-04-16 DIAGNOSIS — E7800 Pure hypercholesterolemia, unspecified: Secondary | ICD-10-CM | POA: Diagnosis not present

## 2022-04-16 DIAGNOSIS — Z853 Personal history of malignant neoplasm of breast: Secondary | ICD-10-CM | POA: Diagnosis not present

## 2022-04-16 DIAGNOSIS — Z951 Presence of aortocoronary bypass graft: Secondary | ICD-10-CM | POA: Diagnosis not present

## 2022-04-16 DIAGNOSIS — Z955 Presence of coronary angioplasty implant and graft: Secondary | ICD-10-CM | POA: Diagnosis not present

## 2022-04-16 DIAGNOSIS — M19019 Primary osteoarthritis, unspecified shoulder: Secondary | ICD-10-CM | POA: Diagnosis not present

## 2022-04-16 DIAGNOSIS — Z7982 Long term (current) use of aspirin: Secondary | ICD-10-CM | POA: Diagnosis not present

## 2022-04-16 DIAGNOSIS — Z7901 Long term (current) use of anticoagulants: Secondary | ICD-10-CM | POA: Diagnosis not present

## 2022-04-16 NOTE — Telephone Encounter (Signed)
Requested Prescriptions  Pending Prescriptions Disp Refills  . isosorbide mononitrate (IMDUR) 30 MG 24 hr tablet [Pharmacy Med Name: ISOSORBIDE MONONITRATE '30MG'$  ER TABS] 90 tablet 0    Sig: TAKE 1 TABLET(30 MG) BY MOUTH DAILY     Cardiovascular:  Nitrates Failed - 04/16/2022  8:00 AM      Failed - Last BP in normal range    BP Readings from Last 1 Encounters:  06/29/21 140/84         Passed - Last Heart Rate in normal range    Pulse Readings from Last 1 Encounters:  06/29/21 70         Passed - Valid encounter within last 12 months    Recent Outpatient Visits          9 months ago Essential (primary) hypertension   [facility] Glean Hess, MD   1 year ago Pseudogout of hand, left   [facility] Glean Hess, MD   1 year ago Essential (primary) hypertension   [facility] Glean Hess, MD   1 year ago Pseudogout of right knee   [facility] Glean Hess, MD   1 year ago Senile dementia, uncomplicated [facility])   [facility] Glean Hess, MD      Future Appointments            In 2 weeks Army Melia Jesse Sans, MD [facility], [facility]

## 2022-04-19 DIAGNOSIS — M19019 Primary osteoarthritis, unspecified shoulder: Secondary | ICD-10-CM | POA: Diagnosis not present

## 2022-04-19 DIAGNOSIS — Z853 Personal history of malignant neoplasm of breast: Secondary | ICD-10-CM | POA: Diagnosis not present

## 2022-04-19 DIAGNOSIS — H401122 Primary open-angle glaucoma, left eye, moderate stage: Secondary | ICD-10-CM | POA: Diagnosis not present

## 2022-04-19 DIAGNOSIS — M009 Pyogenic arthritis, unspecified: Secondary | ICD-10-CM | POA: Diagnosis not present

## 2022-04-19 DIAGNOSIS — E7800 Pure hypercholesterolemia, unspecified: Secondary | ICD-10-CM | POA: Diagnosis not present

## 2022-04-19 DIAGNOSIS — Z7982 Long term (current) use of aspirin: Secondary | ICD-10-CM | POA: Diagnosis not present

## 2022-04-19 DIAGNOSIS — Z7901 Long term (current) use of anticoagulants: Secondary | ICD-10-CM | POA: Diagnosis not present

## 2022-04-19 DIAGNOSIS — I10 Essential (primary) hypertension: Secondary | ICD-10-CM | POA: Diagnosis not present

## 2022-04-19 DIAGNOSIS — Z791 Long term (current) use of non-steroidal anti-inflammatories (NSAID): Secondary | ICD-10-CM | POA: Diagnosis not present

## 2022-04-19 DIAGNOSIS — I2510 Atherosclerotic heart disease of native coronary artery without angina pectoris: Secondary | ICD-10-CM | POA: Diagnosis not present

## 2022-04-19 DIAGNOSIS — M810 Age-related osteoporosis without current pathological fracture: Secondary | ICD-10-CM | POA: Diagnosis not present

## 2022-04-19 DIAGNOSIS — Z951 Presence of aortocoronary bypass graft: Secondary | ICD-10-CM | POA: Diagnosis not present

## 2022-04-19 DIAGNOSIS — Z955 Presence of coronary angioplasty implant and graft: Secondary | ICD-10-CM | POA: Diagnosis not present

## 2022-04-19 DIAGNOSIS — I4819 Other persistent atrial fibrillation: Secondary | ICD-10-CM | POA: Diagnosis not present

## 2022-04-20 DIAGNOSIS — I4819 Other persistent atrial fibrillation: Secondary | ICD-10-CM | POA: Diagnosis not present

## 2022-04-20 DIAGNOSIS — M009 Pyogenic arthritis, unspecified: Secondary | ICD-10-CM | POA: Diagnosis not present

## 2022-04-20 DIAGNOSIS — Z955 Presence of coronary angioplasty implant and graft: Secondary | ICD-10-CM | POA: Diagnosis not present

## 2022-04-20 DIAGNOSIS — H401122 Primary open-angle glaucoma, left eye, moderate stage: Secondary | ICD-10-CM | POA: Diagnosis not present

## 2022-04-20 DIAGNOSIS — Z951 Presence of aortocoronary bypass graft: Secondary | ICD-10-CM | POA: Diagnosis not present

## 2022-04-20 DIAGNOSIS — M810 Age-related osteoporosis without current pathological fracture: Secondary | ICD-10-CM | POA: Diagnosis not present

## 2022-04-20 DIAGNOSIS — E7800 Pure hypercholesterolemia, unspecified: Secondary | ICD-10-CM | POA: Diagnosis not present

## 2022-04-20 DIAGNOSIS — M19019 Primary osteoarthritis, unspecified shoulder: Secondary | ICD-10-CM | POA: Diagnosis not present

## 2022-04-20 DIAGNOSIS — Z7901 Long term (current) use of anticoagulants: Secondary | ICD-10-CM | POA: Diagnosis not present

## 2022-04-20 DIAGNOSIS — Z791 Long term (current) use of non-steroidal anti-inflammatories (NSAID): Secondary | ICD-10-CM | POA: Diagnosis not present

## 2022-04-20 DIAGNOSIS — I2510 Atherosclerotic heart disease of native coronary artery without angina pectoris: Secondary | ICD-10-CM | POA: Diagnosis not present

## 2022-04-20 DIAGNOSIS — I10 Essential (primary) hypertension: Secondary | ICD-10-CM | POA: Diagnosis not present

## 2022-04-20 DIAGNOSIS — Z7982 Long term (current) use of aspirin: Secondary | ICD-10-CM | POA: Diagnosis not present

## 2022-04-20 DIAGNOSIS — Z853 Personal history of malignant neoplasm of breast: Secondary | ICD-10-CM | POA: Diagnosis not present

## 2022-04-21 DIAGNOSIS — I2510 Atherosclerotic heart disease of native coronary artery without angina pectoris: Secondary | ICD-10-CM | POA: Diagnosis not present

## 2022-04-21 DIAGNOSIS — Z791 Long term (current) use of non-steroidal anti-inflammatories (NSAID): Secondary | ICD-10-CM | POA: Diagnosis not present

## 2022-04-21 DIAGNOSIS — Z853 Personal history of malignant neoplasm of breast: Secondary | ICD-10-CM | POA: Diagnosis not present

## 2022-04-21 DIAGNOSIS — M19019 Primary osteoarthritis, unspecified shoulder: Secondary | ICD-10-CM | POA: Diagnosis not present

## 2022-04-21 DIAGNOSIS — Z7901 Long term (current) use of anticoagulants: Secondary | ICD-10-CM | POA: Diagnosis not present

## 2022-04-21 DIAGNOSIS — Z951 Presence of aortocoronary bypass graft: Secondary | ICD-10-CM | POA: Diagnosis not present

## 2022-04-21 DIAGNOSIS — I4819 Other persistent atrial fibrillation: Secondary | ICD-10-CM | POA: Diagnosis not present

## 2022-04-21 DIAGNOSIS — I10 Essential (primary) hypertension: Secondary | ICD-10-CM | POA: Diagnosis not present

## 2022-04-21 DIAGNOSIS — Z955 Presence of coronary angioplasty implant and graft: Secondary | ICD-10-CM | POA: Diagnosis not present

## 2022-04-21 DIAGNOSIS — E7800 Pure hypercholesterolemia, unspecified: Secondary | ICD-10-CM | POA: Diagnosis not present

## 2022-04-21 DIAGNOSIS — Z7982 Long term (current) use of aspirin: Secondary | ICD-10-CM | POA: Diagnosis not present

## 2022-04-21 DIAGNOSIS — M009 Pyogenic arthritis, unspecified: Secondary | ICD-10-CM | POA: Diagnosis not present

## 2022-04-21 DIAGNOSIS — H401122 Primary open-angle glaucoma, left eye, moderate stage: Secondary | ICD-10-CM | POA: Diagnosis not present

## 2022-04-21 DIAGNOSIS — M810 Age-related osteoporosis without current pathological fracture: Secondary | ICD-10-CM | POA: Diagnosis not present

## 2022-04-22 DIAGNOSIS — Z951 Presence of aortocoronary bypass graft: Secondary | ICD-10-CM | POA: Diagnosis not present

## 2022-04-22 DIAGNOSIS — Z7901 Long term (current) use of anticoagulants: Secondary | ICD-10-CM | POA: Diagnosis not present

## 2022-04-22 DIAGNOSIS — M810 Age-related osteoporosis without current pathological fracture: Secondary | ICD-10-CM | POA: Diagnosis not present

## 2022-04-22 DIAGNOSIS — Z955 Presence of coronary angioplasty implant and graft: Secondary | ICD-10-CM | POA: Diagnosis not present

## 2022-04-22 DIAGNOSIS — E7800 Pure hypercholesterolemia, unspecified: Secondary | ICD-10-CM | POA: Diagnosis not present

## 2022-04-22 DIAGNOSIS — I10 Essential (primary) hypertension: Secondary | ICD-10-CM | POA: Diagnosis not present

## 2022-04-22 DIAGNOSIS — Z7982 Long term (current) use of aspirin: Secondary | ICD-10-CM | POA: Diagnosis not present

## 2022-04-22 DIAGNOSIS — M19019 Primary osteoarthritis, unspecified shoulder: Secondary | ICD-10-CM | POA: Diagnosis not present

## 2022-04-22 DIAGNOSIS — Z853 Personal history of malignant neoplasm of breast: Secondary | ICD-10-CM | POA: Diagnosis not present

## 2022-04-22 DIAGNOSIS — Z791 Long term (current) use of non-steroidal anti-inflammatories (NSAID): Secondary | ICD-10-CM | POA: Diagnosis not present

## 2022-04-22 DIAGNOSIS — I2510 Atherosclerotic heart disease of native coronary artery without angina pectoris: Secondary | ICD-10-CM | POA: Diagnosis not present

## 2022-04-22 DIAGNOSIS — I4819 Other persistent atrial fibrillation: Secondary | ICD-10-CM | POA: Diagnosis not present

## 2022-04-22 DIAGNOSIS — H401122 Primary open-angle glaucoma, left eye, moderate stage: Secondary | ICD-10-CM | POA: Diagnosis not present

## 2022-04-22 DIAGNOSIS — M009 Pyogenic arthritis, unspecified: Secondary | ICD-10-CM | POA: Diagnosis not present

## 2022-04-26 DIAGNOSIS — H401122 Primary open-angle glaucoma, left eye, moderate stage: Secondary | ICD-10-CM | POA: Diagnosis not present

## 2022-04-26 DIAGNOSIS — Z7901 Long term (current) use of anticoagulants: Secondary | ICD-10-CM | POA: Diagnosis not present

## 2022-04-26 DIAGNOSIS — Z853 Personal history of malignant neoplasm of breast: Secondary | ICD-10-CM | POA: Diagnosis not present

## 2022-04-26 DIAGNOSIS — Z951 Presence of aortocoronary bypass graft: Secondary | ICD-10-CM | POA: Diagnosis not present

## 2022-04-26 DIAGNOSIS — Z955 Presence of coronary angioplasty implant and graft: Secondary | ICD-10-CM | POA: Diagnosis not present

## 2022-04-26 DIAGNOSIS — Z7982 Long term (current) use of aspirin: Secondary | ICD-10-CM | POA: Diagnosis not present

## 2022-04-26 DIAGNOSIS — I10 Essential (primary) hypertension: Secondary | ICD-10-CM | POA: Diagnosis not present

## 2022-04-26 DIAGNOSIS — Z791 Long term (current) use of non-steroidal anti-inflammatories (NSAID): Secondary | ICD-10-CM | POA: Diagnosis not present

## 2022-04-26 DIAGNOSIS — I4819 Other persistent atrial fibrillation: Secondary | ICD-10-CM | POA: Diagnosis not present

## 2022-04-26 DIAGNOSIS — M009 Pyogenic arthritis, unspecified: Secondary | ICD-10-CM | POA: Diagnosis not present

## 2022-04-26 DIAGNOSIS — M19019 Primary osteoarthritis, unspecified shoulder: Secondary | ICD-10-CM | POA: Diagnosis not present

## 2022-04-26 DIAGNOSIS — I2510 Atherosclerotic heart disease of native coronary artery without angina pectoris: Secondary | ICD-10-CM | POA: Diagnosis not present

## 2022-04-26 DIAGNOSIS — M810 Age-related osteoporosis without current pathological fracture: Secondary | ICD-10-CM | POA: Diagnosis not present

## 2022-04-26 DIAGNOSIS — E7800 Pure hypercholesterolemia, unspecified: Secondary | ICD-10-CM | POA: Diagnosis not present

## 2022-04-27 DIAGNOSIS — I2510 Atherosclerotic heart disease of native coronary artery without angina pectoris: Secondary | ICD-10-CM | POA: Diagnosis not present

## 2022-04-27 DIAGNOSIS — Z951 Presence of aortocoronary bypass graft: Secondary | ICD-10-CM | POA: Diagnosis not present

## 2022-04-27 DIAGNOSIS — I4819 Other persistent atrial fibrillation: Secondary | ICD-10-CM | POA: Diagnosis not present

## 2022-04-27 DIAGNOSIS — Z955 Presence of coronary angioplasty implant and graft: Secondary | ICD-10-CM | POA: Diagnosis not present

## 2022-04-27 DIAGNOSIS — H401122 Primary open-angle glaucoma, left eye, moderate stage: Secondary | ICD-10-CM | POA: Diagnosis not present

## 2022-04-27 DIAGNOSIS — E7800 Pure hypercholesterolemia, unspecified: Secondary | ICD-10-CM | POA: Diagnosis not present

## 2022-04-27 DIAGNOSIS — Z791 Long term (current) use of non-steroidal anti-inflammatories (NSAID): Secondary | ICD-10-CM | POA: Diagnosis not present

## 2022-04-27 DIAGNOSIS — M810 Age-related osteoporosis without current pathological fracture: Secondary | ICD-10-CM | POA: Diagnosis not present

## 2022-04-27 DIAGNOSIS — I10 Essential (primary) hypertension: Secondary | ICD-10-CM | POA: Diagnosis not present

## 2022-04-27 DIAGNOSIS — M009 Pyogenic arthritis, unspecified: Secondary | ICD-10-CM | POA: Diagnosis not present

## 2022-04-27 DIAGNOSIS — M19019 Primary osteoarthritis, unspecified shoulder: Secondary | ICD-10-CM | POA: Diagnosis not present

## 2022-04-27 DIAGNOSIS — Z853 Personal history of malignant neoplasm of breast: Secondary | ICD-10-CM | POA: Diagnosis not present

## 2022-04-27 DIAGNOSIS — Z7982 Long term (current) use of aspirin: Secondary | ICD-10-CM | POA: Diagnosis not present

## 2022-04-27 DIAGNOSIS — Z7901 Long term (current) use of anticoagulants: Secondary | ICD-10-CM | POA: Diagnosis not present

## 2022-04-29 ENCOUNTER — Encounter: Payer: Self-pay | Admitting: Internal Medicine

## 2022-04-30 ENCOUNTER — Encounter: Payer: Self-pay | Admitting: Internal Medicine

## 2022-04-30 ENCOUNTER — Other Ambulatory Visit: Payer: Self-pay | Admitting: Internal Medicine

## 2022-04-30 ENCOUNTER — Ambulatory Visit (INDEPENDENT_AMBULATORY_CARE_PROVIDER_SITE_OTHER): Payer: Medicare Other | Admitting: Internal Medicine

## 2022-04-30 VITALS — BP 118/54 | HR 74 | Ht 60.0 in | Wt 123.0 lb

## 2022-04-30 DIAGNOSIS — Z955 Presence of coronary angioplasty implant and graft: Secondary | ICD-10-CM | POA: Diagnosis not present

## 2022-04-30 DIAGNOSIS — Z951 Presence of aortocoronary bypass graft: Secondary | ICD-10-CM | POA: Diagnosis not present

## 2022-04-30 DIAGNOSIS — I4819 Other persistent atrial fibrillation: Secondary | ICD-10-CM

## 2022-04-30 DIAGNOSIS — I10 Essential (primary) hypertension: Secondary | ICD-10-CM | POA: Diagnosis not present

## 2022-04-30 DIAGNOSIS — M009 Pyogenic arthritis, unspecified: Secondary | ICD-10-CM | POA: Diagnosis not present

## 2022-04-30 DIAGNOSIS — Z7982 Long term (current) use of aspirin: Secondary | ICD-10-CM | POA: Diagnosis not present

## 2022-04-30 DIAGNOSIS — I779 Disorder of arteries and arterioles, unspecified: Secondary | ICD-10-CM

## 2022-04-30 DIAGNOSIS — Z791 Long term (current) use of non-steroidal anti-inflammatories (NSAID): Secondary | ICD-10-CM | POA: Diagnosis not present

## 2022-04-30 DIAGNOSIS — C7989 Secondary malignant neoplasm of other specified sites: Secondary | ICD-10-CM

## 2022-04-30 DIAGNOSIS — M19019 Primary osteoarthritis, unspecified shoulder: Secondary | ICD-10-CM | POA: Diagnosis not present

## 2022-04-30 DIAGNOSIS — I2510 Atherosclerotic heart disease of native coronary artery without angina pectoris: Secondary | ICD-10-CM | POA: Diagnosis not present

## 2022-04-30 DIAGNOSIS — M810 Age-related osteoporosis without current pathological fracture: Secondary | ICD-10-CM | POA: Diagnosis not present

## 2022-04-30 DIAGNOSIS — D6869 Other thrombophilia: Secondary | ICD-10-CM | POA: Diagnosis not present

## 2022-04-30 DIAGNOSIS — Z853 Personal history of malignant neoplasm of breast: Secondary | ICD-10-CM | POA: Diagnosis not present

## 2022-04-30 DIAGNOSIS — C50911 Malignant neoplasm of unspecified site of right female breast: Secondary | ICD-10-CM | POA: Diagnosis not present

## 2022-04-30 DIAGNOSIS — M11261 Other chondrocalcinosis, right knee: Secondary | ICD-10-CM | POA: Diagnosis not present

## 2022-04-30 DIAGNOSIS — Z7901 Long term (current) use of anticoagulants: Secondary | ICD-10-CM | POA: Diagnosis not present

## 2022-04-30 DIAGNOSIS — H401122 Primary open-angle glaucoma, left eye, moderate stage: Secondary | ICD-10-CM | POA: Diagnosis not present

## 2022-04-30 DIAGNOSIS — E7800 Pure hypercholesterolemia, unspecified: Secondary | ICD-10-CM | POA: Diagnosis not present

## 2022-04-30 NOTE — Progress Notes (Signed)
Date:  04/30/2022   Name:  Donna Santos   DOB:  1925-06-25   MRN:  [id number]   Chief Complaint: Hypertension, Gout, and Fall (2 falls after knee surgery, getting a little better with walking went 2 ER for falls, can put much weight on right leg )   Hospitalized in April with a septic knee.  She underwent surgery for debridement.  Treated with oral Zyvox x 14 days. Cultures were all negative.   Had been recovering uneventfully until she fell on 04/07/22.  She was seen in the ED and no fractures were noted.  She has just been able to walk some this past week.  Hypertension This is a chronic problem. The problem is controlled. Pertinent negatives include no chest pain, headaches or shortness of breath. Past treatments include calcium channel blockers, beta blockers and angiotensin blockers. Hypertensive end-organ damage includes kidney disease and CAD/MI. There is no history of CVA.  Blood pressure has been somewhat low and the [facility] was concerned.  She has mild lightheadedness at times but the symptoms are brief.  Lab Results  Component Value Date   NA 136 06/17/2021   K 3.5 06/17/2021   CO2 27 06/17/2021   GLUCOSE 126 (H) 06/17/2021   BUN 16 06/17/2021   CREATININE 0.83 06/17/2021   CALCIUM 9.1 06/17/2021   GFRNONAA >60 06/17/2021   Lab Results  Component Value Date   CHOL 261 (A) 07/24/2013   HDL 44 07/24/2013   LDLCALC 179 07/24/2013   TRIG 192 (A) 07/24/2013   Lab Results  Component Value Date   TSH 3.300 05/02/2019   No results found for: HGBA1C Lab Results  Component Value Date   WBC 7.8 06/17/2021   HGB 13.0 06/17/2021   HCT 38.6 06/17/2021   MCV 94.1 06/17/2021   PLT 322 06/17/2021   Lab Results  Component Value Date   ALT 14 06/17/2021   AST 16 06/17/2021   ALKPHOS 38 06/17/2021   BILITOT 0.6 06/17/2021   Lab Results  Component Value Date   VD25OH 46.0 05/02/2019     Review of Systems  Constitutional:  Negative for appetite change, fever and  unexpected weight change.  HENT:  Negative for trouble swallowing.   Respiratory:  Negative for cough, chest tightness and shortness of breath.   Cardiovascular:  Negative for chest pain and leg swelling.  Gastrointestinal:  Negative for abdominal pain, constipation and diarrhea.  Genitourinary:  Negative for dysuria and urgency.  Musculoskeletal:  Positive for arthralgias and gait problem.  Neurological:  Positive for light-headedness. Negative for dizziness and headaches.  Psychiatric/Behavioral:  Negative for dysphoric mood and sleep disturbance. The patient is not nervous/anxious.    Patient Active Problem List   Diagnosis Date Noted   Acquired thrombophilia ([place]) 09/25/2020   Pseudogout of right knee 08/04/2020   Aortic atherosclerosis ([place]) 07/10/2020   Hyponatremia 06/16/2020   Hypokalemia 12/17/2019   Chest wall recurrence of right breast cancer ([place]) 11/16/2019   Osteoporosis 06/17/2019   Primary open angle glaucoma (POAG) of left eye, moderate stage 04/25/2018   Persistent atrial fibrillation ([place]) 12/28/2016   Carcinoma of overlapping sites of right breast in female, estrogen receptor positive ([place]) 12/21/2016   Senile dementia, uncomplicated ([place]) 27/04/2375   Low back pain 04/12/2016   Impaired renal function 05/09/2015   Arteriosclerosis of coronary artery 05/09/2015   Essential (primary) hypertension 05/09/2015   Personal history of malignant neoplasm of breast 05/09/2015   Muscle spasms of neck 05/09/2015  Arthritis of shoulder region, degenerative 05/09/2015   Hypercholesteremia 04/20/2012   Carotid artery disease ([place]) 05/28/2005    Allergies  Allergen Reactions   Ace Inhibitors     Other reaction(s): Angioedema   Brinzolamide-Brimonidine     Past Surgical History:  Procedure Laterality Date   ARTHROTOMY KNEE-INFEC W/EXPLOR/DRAIN/REMOV FB Right 02/2022   CAROTID ENDARTERECTOMY     CORONARY ARTERY BYPASS GRAFT  2006   MASS EXCISION Right 08/28/2015    Procedure: EXCISION MASS; remove massive right chest wall;  Surgeon: Florene Glen, MD;  Location: [facility] ORS;  Service: General;  Laterality: Right;   MASTECTOMY, RADICAL Right 2002   BREAST CA   TOTAL ABDOMINAL HYSTERECTOMY      Social History   Tobacco Use   Smoking status: Former    Packs/day: 1.50    Years: 1.50    Pack years: 2.25    Types: Cigarettes    Quit date: 10/04/1952    Years since quitting: 69.6   Smokeless tobacco: Never  Vaping Use   Vaping Use: Never used  Substance Use Topics   Alcohol use: No    Alcohol/week: 0.0 standard drinks   Drug use: No     Medication list has been reviewed and updated.  Current Meds  Medication Sig   amLODipine (NORVASC) 5 MG tablet TAKE 1 TABLET(5 MG) BY MOUTH TWICE DAILY   aspirin EC 81 MG tablet Take 81 mg by mouth daily.   Calcium Carb-Cholecalciferol 600-800 MG-UNIT TABS Take 1 tablet by mouth daily. Reported on 12/25/2015   Cholecalciferol (VITAMIN D-3) 125 MCG (5000 UT) TABS Take 1 tablet by mouth daily.   ELIQUIS 2.5 MG TABS tablet TAKE 1 TABLET(2.5 MG) BY MOUTH TWICE DAILY   isosorbide mononitrate (IMDUR) 30 MG 24 hr tablet TAKE 1 TABLET(30 MG) BY MOUTH DAILY   latanoprost (XALATAN) 0.005 % ophthalmic solution INT 1 GTT INTO EACH EYE QHS.   losartan (COZAAR) 25 MG tablet TAKE 1 TABLET(25 MG) BY MOUTH DAILY   metoprolol succinate (TOPROL-XL) 50 MG 24 hr tablet TAKE 1 TABLET BY MOUTH EVERY NIGHT   nitroGLYCERIN (NITROSTAT) 0.4 MG SL tablet Place 0.4 mg under the tongue every 5 (five) minutes as needed for chest pain.   timolol (TIMOPTIC) 0.25 % ophthalmic solution INSTILL ONE DROP INTO BOTH EYES EVERY MORNING.   TURMERIC PO Take 1 tablet by mouth daily.       04/30/2022    1:32 PM 06/29/2021    1:41 PM 02/06/2021   10:59 AM 01/06/2021    1:36 PM  GAD 7 : Generalized Anxiety Score  Nervous, Anxious, on Edge 0 0 0 0  Control/stop worrying 0 1 0 0  Worry too much - different things 0 0 0 0  Trouble relaxing 0 0 0 0   Restless 0 0 0 0  Easily annoyed or irritable 0 0 0 0  Afraid - awful might happen 0 0 0 0  Total GAD 7 Score 0 1 0 0  Anxiety Difficulty Not difficult at all   Not difficult at all       04/30/2022    1:31 PM  Depression screen PHQ 2/9  Decreased Interest 1  Down, Depressed, Hopeless 0  PHQ - 2 Score 1  Altered sleeping 0  Tired, decreased energy 1  Change in appetite 1  Feeling bad or failure about yourself  0  Trouble concentrating 1  Moving slowly or fidgety/restless 0  Suicidal thoughts 0  PHQ-9 Score 4  Difficult doing work/chores Not difficult at all    BP Readings from Last 3 Encounters:  04/30/22 (!) 118/54  06/29/21 140/84  06/17/21 (!) 154/64    Physical Exam Vitals and nursing note reviewed.  Constitutional:      General: She is not in acute distress.    Appearance: Normal appearance. She is well-developed.  HENT:     Head: Normocephalic and atraumatic.  Cardiovascular:     Rate and Rhythm: Normal rate. Rhythm irregular.     Pulses: Normal pulses.     Heart sounds: No murmur heard. Pulmonary:     Effort: Pulmonary effort is normal. No respiratory distress.     Breath sounds: No wheezing or rhonchi.  Abdominal:     Palpations: Abdomen is soft.     Tenderness: There is no abdominal tenderness.  Musculoskeletal:     Cervical back: Normal range of motion.     Right knee: Bony tenderness (surgical site intact) present.     Right lower leg: No edema.     Left lower leg: No edema.  Lymphadenopathy:     Cervical: No cervical adenopathy.  Skin:    General: Skin is warm and dry.     Findings: No rash.  Neurological:     Mental Status: She is alert and oriented to person, place, and time.  Psychiatric:        Attention and Perception: Attention normal.        Mood and Affect: Mood normal.        Speech: Speech normal.        Behavior: Behavior normal.    Wt Readings from Last 3 Encounters:  04/30/22 123 lb (55.8 kg)  06/29/21 121 lb (54.9 kg)   06/17/21 127 lb (57.6 kg)    BP (!) 118/54   Pulse 74   Ht 5' (1.524 m)   Wt 123 lb (55.8 kg)   SpO2 94%   BMI 24.02 kg/m   Assessment and Plan: 1. Essential (primary) hypertension Clinically stable exam with well controlled BP. Tolerating medications without side effects at this time, however BP are slightly low and she is on 4 agents. Will stop losartan and monitor BP.  Goal < 150/90.  2. Persistent atrial fibrillation (HCC) Rate controlled on metoprolol. No chest pain or shortness of breath; no PND  3. Pseudogout of right knee S/p debridement with no crystals seen and culture negative Continue HH  and PTx. Tylenol as needed.  4. Acquired thrombophilia ([place]) On Eliquis without bleeding issues  5. Chest wall recurrence of right breast cancer ([place]) Followed by Oncology.  6. Carotid artery disease, unspecified laterality ([place]) Stable without angina Continue metoprolol, amlodipine and Imdur

## 2022-04-30 NOTE — Telephone Encounter (Signed)
Requested medication (s) are due for refill today: yes  Requested medication (s) are on the active medication list: yes  Last refill:  04/08/22 #30 0 refills  Future visit scheduled: seen today  Notes to clinic:  protocol failed 04/30/22 BP 118/54. Do you want to refill Rx?     Requested Prescriptions  Pending Prescriptions Disp Refills   losartan (COZAAR) 25 MG tablet [Pharmacy Med Name: LOSARTAN '25MG'$  TABLETS] 30 tablet 0    Sig: TAKE 1 TABLET(25 MG) BY MOUTH DAILY     Cardiovascular:  Angiotensin Receptor Blockers Failed - 04/30/2022 10:40 AM      Failed - Cr in normal range and within 180 days    Creatinine, Ser  Date Value Ref Range Status  06/17/2021 0.83 0.44 - 1.00 mg/dL Final         Failed - K in normal range and within 180 days    Potassium  Date Value Ref Range Status  06/17/2021 3.5 3.5 - 5.1 mmol/L Final         Failed - Last BP in normal range    BP Readings from Last 1 Encounters:  04/30/22 (!) 118/54         Passed - Patient is not pregnant      Passed - Valid encounter within last 6 months    Recent Outpatient Visits           Today Essential (primary) hypertension   [facility] Glean Hess, MD   10 months ago Essential (primary) hypertension   [facility] Glean Hess, MD   1 year ago Pseudogout of hand, left   [facility] Glean Hess, MD   1 year ago Essential (primary) hypertension   [facility] Glean Hess, MD   1 year ago Pseudogout of right knee   Weakley, Laura H, MD       Future Appointments             In 6 months Army Melia Jesse Sans, MD [facility], [facility]

## 2022-04-30 NOTE — Patient Instructions (Signed)
Stop Losartan -  Monitoring blood pressure and call if consistently > 150/90

## 2022-05-03 DIAGNOSIS — Z791 Long term (current) use of non-steroidal anti-inflammatories (NSAID): Secondary | ICD-10-CM | POA: Diagnosis not present

## 2022-05-03 DIAGNOSIS — H401122 Primary open-angle glaucoma, left eye, moderate stage: Secondary | ICD-10-CM | POA: Diagnosis not present

## 2022-05-03 DIAGNOSIS — M810 Age-related osteoporosis without current pathological fracture: Secondary | ICD-10-CM | POA: Diagnosis not present

## 2022-05-03 DIAGNOSIS — Z955 Presence of coronary angioplasty implant and graft: Secondary | ICD-10-CM | POA: Diagnosis not present

## 2022-05-03 DIAGNOSIS — Z7901 Long term (current) use of anticoagulants: Secondary | ICD-10-CM | POA: Diagnosis not present

## 2022-05-03 DIAGNOSIS — I2510 Atherosclerotic heart disease of native coronary artery without angina pectoris: Secondary | ICD-10-CM | POA: Diagnosis not present

## 2022-05-03 DIAGNOSIS — M19019 Primary osteoarthritis, unspecified shoulder: Secondary | ICD-10-CM | POA: Diagnosis not present

## 2022-05-03 DIAGNOSIS — E7800 Pure hypercholesterolemia, unspecified: Secondary | ICD-10-CM | POA: Diagnosis not present

## 2022-05-03 DIAGNOSIS — I4819 Other persistent atrial fibrillation: Secondary | ICD-10-CM | POA: Diagnosis not present

## 2022-05-03 DIAGNOSIS — Z853 Personal history of malignant neoplasm of breast: Secondary | ICD-10-CM | POA: Diagnosis not present

## 2022-05-03 DIAGNOSIS — I10 Essential (primary) hypertension: Secondary | ICD-10-CM | POA: Diagnosis not present

## 2022-05-03 DIAGNOSIS — Z7982 Long term (current) use of aspirin: Secondary | ICD-10-CM | POA: Diagnosis not present

## 2022-05-03 DIAGNOSIS — M009 Pyogenic arthritis, unspecified: Secondary | ICD-10-CM | POA: Diagnosis not present

## 2022-05-03 DIAGNOSIS — Z951 Presence of aortocoronary bypass graft: Secondary | ICD-10-CM | POA: Diagnosis not present

## 2022-05-04 DIAGNOSIS — E7800 Pure hypercholesterolemia, unspecified: Secondary | ICD-10-CM | POA: Diagnosis not present

## 2022-05-04 DIAGNOSIS — H401122 Primary open-angle glaucoma, left eye, moderate stage: Secondary | ICD-10-CM | POA: Diagnosis not present

## 2022-05-04 DIAGNOSIS — Z951 Presence of aortocoronary bypass graft: Secondary | ICD-10-CM | POA: Diagnosis not present

## 2022-05-04 DIAGNOSIS — Z7901 Long term (current) use of anticoagulants: Secondary | ICD-10-CM | POA: Diagnosis not present

## 2022-05-04 DIAGNOSIS — M810 Age-related osteoporosis without current pathological fracture: Secondary | ICD-10-CM | POA: Diagnosis not present

## 2022-05-04 DIAGNOSIS — Z853 Personal history of malignant neoplasm of breast: Secondary | ICD-10-CM | POA: Diagnosis not present

## 2022-05-04 DIAGNOSIS — Z955 Presence of coronary angioplasty implant and graft: Secondary | ICD-10-CM | POA: Diagnosis not present

## 2022-05-04 DIAGNOSIS — I10 Essential (primary) hypertension: Secondary | ICD-10-CM | POA: Diagnosis not present

## 2022-05-04 DIAGNOSIS — I4819 Other persistent atrial fibrillation: Secondary | ICD-10-CM | POA: Diagnosis not present

## 2022-05-04 DIAGNOSIS — Z7982 Long term (current) use of aspirin: Secondary | ICD-10-CM | POA: Diagnosis not present

## 2022-05-04 DIAGNOSIS — I2510 Atherosclerotic heart disease of native coronary artery without angina pectoris: Secondary | ICD-10-CM | POA: Diagnosis not present

## 2022-05-04 DIAGNOSIS — M19019 Primary osteoarthritis, unspecified shoulder: Secondary | ICD-10-CM | POA: Diagnosis not present

## 2022-05-04 DIAGNOSIS — M009 Pyogenic arthritis, unspecified: Secondary | ICD-10-CM | POA: Diagnosis not present

## 2022-05-04 DIAGNOSIS — Z791 Long term (current) use of non-steroidal anti-inflammatories (NSAID): Secondary | ICD-10-CM | POA: Diagnosis not present

## 2022-05-05 ENCOUNTER — Telehealth: Payer: Self-pay | Admitting: Internal Medicine

## 2022-05-05 DIAGNOSIS — H401132 Primary open-angle glaucoma, bilateral, moderate stage: Secondary | ICD-10-CM | POA: Diagnosis not present

## 2022-05-05 DIAGNOSIS — Z961 Presence of intraocular lens: Secondary | ICD-10-CM | POA: Diagnosis not present

## 2022-05-05 DIAGNOSIS — H04123 Dry eye syndrome of bilateral lacrimal glands: Secondary | ICD-10-CM | POA: Diagnosis not present

## 2022-05-05 NOTE — Telephone Encounter (Signed)
Noted  KP 

## 2022-05-05 NOTE — Telephone Encounter (Signed)
Copied from [place] [id number]. Topic: General - Inquiry >> May 05, 2022  1:05 PM McGill, Nelva Bush wrote: Reason for AST:MHDQ Rankins from [facility] stated that he wanted to make PCP aware that pt missed home health PT today.  Pt had an eye appointment that family did not make him aware of.

## 2022-05-07 DIAGNOSIS — Z7982 Long term (current) use of aspirin: Secondary | ICD-10-CM | POA: Diagnosis not present

## 2022-05-07 DIAGNOSIS — Z951 Presence of aortocoronary bypass graft: Secondary | ICD-10-CM | POA: Diagnosis not present

## 2022-05-07 DIAGNOSIS — H401122 Primary open-angle glaucoma, left eye, moderate stage: Secondary | ICD-10-CM | POA: Diagnosis not present

## 2022-05-07 DIAGNOSIS — I10 Essential (primary) hypertension: Secondary | ICD-10-CM | POA: Diagnosis not present

## 2022-05-07 DIAGNOSIS — M009 Pyogenic arthritis, unspecified: Secondary | ICD-10-CM | POA: Diagnosis not present

## 2022-05-07 DIAGNOSIS — I4819 Other persistent atrial fibrillation: Secondary | ICD-10-CM | POA: Diagnosis not present

## 2022-05-07 DIAGNOSIS — M810 Age-related osteoporosis without current pathological fracture: Secondary | ICD-10-CM | POA: Diagnosis not present

## 2022-05-07 DIAGNOSIS — Z791 Long term (current) use of non-steroidal anti-inflammatories (NSAID): Secondary | ICD-10-CM | POA: Diagnosis not present

## 2022-05-07 DIAGNOSIS — Z7901 Long term (current) use of anticoagulants: Secondary | ICD-10-CM | POA: Diagnosis not present

## 2022-05-07 DIAGNOSIS — Z853 Personal history of malignant neoplasm of breast: Secondary | ICD-10-CM | POA: Diagnosis not present

## 2022-05-07 DIAGNOSIS — M19019 Primary osteoarthritis, unspecified shoulder: Secondary | ICD-10-CM | POA: Diagnosis not present

## 2022-05-07 DIAGNOSIS — I2510 Atherosclerotic heart disease of native coronary artery without angina pectoris: Secondary | ICD-10-CM | POA: Diagnosis not present

## 2022-05-07 DIAGNOSIS — Z955 Presence of coronary angioplasty implant and graft: Secondary | ICD-10-CM | POA: Diagnosis not present

## 2022-05-07 DIAGNOSIS — E7800 Pure hypercholesterolemia, unspecified: Secondary | ICD-10-CM | POA: Diagnosis not present

## 2022-05-10 DIAGNOSIS — H401122 Primary open-angle glaucoma, left eye, moderate stage: Secondary | ICD-10-CM | POA: Diagnosis not present

## 2022-05-10 DIAGNOSIS — Z7901 Long term (current) use of anticoagulants: Secondary | ICD-10-CM | POA: Diagnosis not present

## 2022-05-10 DIAGNOSIS — I10 Essential (primary) hypertension: Secondary | ICD-10-CM | POA: Diagnosis not present

## 2022-05-10 DIAGNOSIS — Z955 Presence of coronary angioplasty implant and graft: Secondary | ICD-10-CM | POA: Diagnosis not present

## 2022-05-10 DIAGNOSIS — M19019 Primary osteoarthritis, unspecified shoulder: Secondary | ICD-10-CM | POA: Diagnosis not present

## 2022-05-10 DIAGNOSIS — Z853 Personal history of malignant neoplasm of breast: Secondary | ICD-10-CM | POA: Diagnosis not present

## 2022-05-10 DIAGNOSIS — Z791 Long term (current) use of non-steroidal anti-inflammatories (NSAID): Secondary | ICD-10-CM | POA: Diagnosis not present

## 2022-05-10 DIAGNOSIS — Z951 Presence of aortocoronary bypass graft: Secondary | ICD-10-CM | POA: Diagnosis not present

## 2022-05-10 DIAGNOSIS — I2510 Atherosclerotic heart disease of native coronary artery without angina pectoris: Secondary | ICD-10-CM | POA: Diagnosis not present

## 2022-05-10 DIAGNOSIS — M810 Age-related osteoporosis without current pathological fracture: Secondary | ICD-10-CM | POA: Diagnosis not present

## 2022-05-10 DIAGNOSIS — I4819 Other persistent atrial fibrillation: Secondary | ICD-10-CM | POA: Diagnosis not present

## 2022-05-10 DIAGNOSIS — E7800 Pure hypercholesterolemia, unspecified: Secondary | ICD-10-CM | POA: Diagnosis not present

## 2022-05-10 DIAGNOSIS — Z7982 Long term (current) use of aspirin: Secondary | ICD-10-CM | POA: Diagnosis not present

## 2022-05-10 DIAGNOSIS — M009 Pyogenic arthritis, unspecified: Secondary | ICD-10-CM | POA: Diagnosis not present

## 2022-05-11 DIAGNOSIS — I10 Essential (primary) hypertension: Secondary | ICD-10-CM | POA: Diagnosis not present

## 2022-05-11 DIAGNOSIS — Z955 Presence of coronary angioplasty implant and graft: Secondary | ICD-10-CM | POA: Diagnosis not present

## 2022-05-11 DIAGNOSIS — I2510 Atherosclerotic heart disease of native coronary artery without angina pectoris: Secondary | ICD-10-CM | POA: Diagnosis not present

## 2022-05-11 DIAGNOSIS — I4819 Other persistent atrial fibrillation: Secondary | ICD-10-CM | POA: Diagnosis not present

## 2022-05-11 DIAGNOSIS — E7800 Pure hypercholesterolemia, unspecified: Secondary | ICD-10-CM | POA: Diagnosis not present

## 2022-05-11 DIAGNOSIS — H401122 Primary open-angle glaucoma, left eye, moderate stage: Secondary | ICD-10-CM | POA: Diagnosis not present

## 2022-05-11 DIAGNOSIS — Z791 Long term (current) use of non-steroidal anti-inflammatories (NSAID): Secondary | ICD-10-CM | POA: Diagnosis not present

## 2022-05-11 DIAGNOSIS — Z7901 Long term (current) use of anticoagulants: Secondary | ICD-10-CM | POA: Diagnosis not present

## 2022-05-11 DIAGNOSIS — M009 Pyogenic arthritis, unspecified: Secondary | ICD-10-CM | POA: Diagnosis not present

## 2022-05-11 DIAGNOSIS — Z7982 Long term (current) use of aspirin: Secondary | ICD-10-CM | POA: Diagnosis not present

## 2022-05-11 DIAGNOSIS — Z951 Presence of aortocoronary bypass graft: Secondary | ICD-10-CM | POA: Diagnosis not present

## 2022-05-11 DIAGNOSIS — M810 Age-related osteoporosis without current pathological fracture: Secondary | ICD-10-CM | POA: Diagnosis not present

## 2022-05-11 DIAGNOSIS — Z853 Personal history of malignant neoplasm of breast: Secondary | ICD-10-CM | POA: Diagnosis not present

## 2022-05-11 DIAGNOSIS — M19019 Primary osteoarthritis, unspecified shoulder: Secondary | ICD-10-CM | POA: Diagnosis not present

## 2022-05-12 DIAGNOSIS — Z791 Long term (current) use of non-steroidal anti-inflammatories (NSAID): Secondary | ICD-10-CM | POA: Diagnosis not present

## 2022-05-12 DIAGNOSIS — Z955 Presence of coronary angioplasty implant and graft: Secondary | ICD-10-CM | POA: Diagnosis not present

## 2022-05-12 DIAGNOSIS — M009 Pyogenic arthritis, unspecified: Secondary | ICD-10-CM | POA: Diagnosis not present

## 2022-05-12 DIAGNOSIS — I4819 Other persistent atrial fibrillation: Secondary | ICD-10-CM | POA: Diagnosis not present

## 2022-05-12 DIAGNOSIS — Z951 Presence of aortocoronary bypass graft: Secondary | ICD-10-CM | POA: Diagnosis not present

## 2022-05-12 DIAGNOSIS — Z7901 Long term (current) use of anticoagulants: Secondary | ICD-10-CM | POA: Diagnosis not present

## 2022-05-12 DIAGNOSIS — I10 Essential (primary) hypertension: Secondary | ICD-10-CM | POA: Diagnosis not present

## 2022-05-12 DIAGNOSIS — Z7982 Long term (current) use of aspirin: Secondary | ICD-10-CM | POA: Diagnosis not present

## 2022-05-12 DIAGNOSIS — I2510 Atherosclerotic heart disease of native coronary artery without angina pectoris: Secondary | ICD-10-CM | POA: Diagnosis not present

## 2022-05-12 DIAGNOSIS — Z853 Personal history of malignant neoplasm of breast: Secondary | ICD-10-CM | POA: Diagnosis not present

## 2022-05-12 DIAGNOSIS — E7800 Pure hypercholesterolemia, unspecified: Secondary | ICD-10-CM | POA: Diagnosis not present

## 2022-05-12 DIAGNOSIS — M810 Age-related osteoporosis without current pathological fracture: Secondary | ICD-10-CM | POA: Diagnosis not present

## 2022-05-12 DIAGNOSIS — M19019 Primary osteoarthritis, unspecified shoulder: Secondary | ICD-10-CM | POA: Diagnosis not present

## 2022-05-12 DIAGNOSIS — H401122 Primary open-angle glaucoma, left eye, moderate stage: Secondary | ICD-10-CM | POA: Diagnosis not present

## 2022-05-14 DIAGNOSIS — I4819 Other persistent atrial fibrillation: Secondary | ICD-10-CM | POA: Diagnosis not present

## 2022-05-14 DIAGNOSIS — Z955 Presence of coronary angioplasty implant and graft: Secondary | ICD-10-CM | POA: Diagnosis not present

## 2022-05-14 DIAGNOSIS — H401122 Primary open-angle glaucoma, left eye, moderate stage: Secondary | ICD-10-CM | POA: Diagnosis not present

## 2022-05-14 DIAGNOSIS — I2510 Atherosclerotic heart disease of native coronary artery without angina pectoris: Secondary | ICD-10-CM | POA: Diagnosis not present

## 2022-05-14 DIAGNOSIS — M810 Age-related osteoporosis without current pathological fracture: Secondary | ICD-10-CM | POA: Diagnosis not present

## 2022-05-14 DIAGNOSIS — M009 Pyogenic arthritis, unspecified: Secondary | ICD-10-CM | POA: Diagnosis not present

## 2022-05-14 DIAGNOSIS — E7800 Pure hypercholesterolemia, unspecified: Secondary | ICD-10-CM | POA: Diagnosis not present

## 2022-05-14 DIAGNOSIS — M19019 Primary osteoarthritis, unspecified shoulder: Secondary | ICD-10-CM | POA: Diagnosis not present

## 2022-05-14 DIAGNOSIS — Z951 Presence of aortocoronary bypass graft: Secondary | ICD-10-CM | POA: Diagnosis not present

## 2022-05-14 DIAGNOSIS — Z853 Personal history of malignant neoplasm of breast: Secondary | ICD-10-CM | POA: Diagnosis not present

## 2022-05-14 DIAGNOSIS — Z7901 Long term (current) use of anticoagulants: Secondary | ICD-10-CM | POA: Diagnosis not present

## 2022-05-14 DIAGNOSIS — Z7982 Long term (current) use of aspirin: Secondary | ICD-10-CM | POA: Diagnosis not present

## 2022-05-14 DIAGNOSIS — Z791 Long term (current) use of non-steroidal anti-inflammatories (NSAID): Secondary | ICD-10-CM | POA: Diagnosis not present

## 2022-05-14 DIAGNOSIS — I10 Essential (primary) hypertension: Secondary | ICD-10-CM | POA: Diagnosis not present

## 2022-05-17 DIAGNOSIS — Z951 Presence of aortocoronary bypass graft: Secondary | ICD-10-CM | POA: Diagnosis not present

## 2022-05-17 DIAGNOSIS — I4819 Other persistent atrial fibrillation: Secondary | ICD-10-CM | POA: Diagnosis not present

## 2022-05-17 DIAGNOSIS — I10 Essential (primary) hypertension: Secondary | ICD-10-CM | POA: Diagnosis not present

## 2022-05-17 DIAGNOSIS — Z791 Long term (current) use of non-steroidal anti-inflammatories (NSAID): Secondary | ICD-10-CM | POA: Diagnosis not present

## 2022-05-17 DIAGNOSIS — Z853 Personal history of malignant neoplasm of breast: Secondary | ICD-10-CM | POA: Diagnosis not present

## 2022-05-17 DIAGNOSIS — E7800 Pure hypercholesterolemia, unspecified: Secondary | ICD-10-CM | POA: Diagnosis not present

## 2022-05-17 DIAGNOSIS — M009 Pyogenic arthritis, unspecified: Secondary | ICD-10-CM | POA: Diagnosis not present

## 2022-05-17 DIAGNOSIS — Z955 Presence of coronary angioplasty implant and graft: Secondary | ICD-10-CM | POA: Diagnosis not present

## 2022-05-17 DIAGNOSIS — H401122 Primary open-angle glaucoma, left eye, moderate stage: Secondary | ICD-10-CM | POA: Diagnosis not present

## 2022-05-17 DIAGNOSIS — Z7901 Long term (current) use of anticoagulants: Secondary | ICD-10-CM | POA: Diagnosis not present

## 2022-05-17 DIAGNOSIS — I2510 Atherosclerotic heart disease of native coronary artery without angina pectoris: Secondary | ICD-10-CM | POA: Diagnosis not present

## 2022-05-17 DIAGNOSIS — M810 Age-related osteoporosis without current pathological fracture: Secondary | ICD-10-CM | POA: Diagnosis not present

## 2022-05-17 DIAGNOSIS — Z7982 Long term (current) use of aspirin: Secondary | ICD-10-CM | POA: Diagnosis not present

## 2022-05-17 DIAGNOSIS — M19019 Primary osteoarthritis, unspecified shoulder: Secondary | ICD-10-CM | POA: Diagnosis not present

## 2022-05-19 DIAGNOSIS — E7800 Pure hypercholesterolemia, unspecified: Secondary | ICD-10-CM | POA: Diagnosis not present

## 2022-05-19 DIAGNOSIS — Z791 Long term (current) use of non-steroidal anti-inflammatories (NSAID): Secondary | ICD-10-CM | POA: Diagnosis not present

## 2022-05-19 DIAGNOSIS — Z955 Presence of coronary angioplasty implant and graft: Secondary | ICD-10-CM | POA: Diagnosis not present

## 2022-05-19 DIAGNOSIS — M009 Pyogenic arthritis, unspecified: Secondary | ICD-10-CM | POA: Diagnosis not present

## 2022-05-19 DIAGNOSIS — H401122 Primary open-angle glaucoma, left eye, moderate stage: Secondary | ICD-10-CM | POA: Diagnosis not present

## 2022-05-19 DIAGNOSIS — M810 Age-related osteoporosis without current pathological fracture: Secondary | ICD-10-CM | POA: Diagnosis not present

## 2022-05-19 DIAGNOSIS — Z951 Presence of aortocoronary bypass graft: Secondary | ICD-10-CM | POA: Diagnosis not present

## 2022-05-19 DIAGNOSIS — I4819 Other persistent atrial fibrillation: Secondary | ICD-10-CM | POA: Diagnosis not present

## 2022-05-19 DIAGNOSIS — Z7982 Long term (current) use of aspirin: Secondary | ICD-10-CM | POA: Diagnosis not present

## 2022-05-19 DIAGNOSIS — I2510 Atherosclerotic heart disease of native coronary artery without angina pectoris: Secondary | ICD-10-CM | POA: Diagnosis not present

## 2022-05-19 DIAGNOSIS — Z7901 Long term (current) use of anticoagulants: Secondary | ICD-10-CM | POA: Diagnosis not present

## 2022-05-19 DIAGNOSIS — I10 Essential (primary) hypertension: Secondary | ICD-10-CM | POA: Diagnosis not present

## 2022-05-19 DIAGNOSIS — Z853 Personal history of malignant neoplasm of breast: Secondary | ICD-10-CM | POA: Diagnosis not present

## 2022-05-19 DIAGNOSIS — M19019 Primary osteoarthritis, unspecified shoulder: Secondary | ICD-10-CM | POA: Diagnosis not present

## 2022-05-25 DIAGNOSIS — Z9049 Acquired absence of other specified parts of digestive tract: Secondary | ICD-10-CM | POA: Diagnosis not present

## 2022-05-25 DIAGNOSIS — Z951 Presence of aortocoronary bypass graft: Secondary | ICD-10-CM | POA: Diagnosis not present

## 2022-05-25 DIAGNOSIS — Z7982 Long term (current) use of aspirin: Secondary | ICD-10-CM | POA: Diagnosis not present

## 2022-05-25 DIAGNOSIS — Z9010 Acquired absence of unspecified breast and nipple: Secondary | ICD-10-CM | POA: Diagnosis not present

## 2022-05-25 DIAGNOSIS — N3000 Acute cystitis without hematuria: Secondary | ICD-10-CM | POA: Diagnosis not present

## 2022-05-25 DIAGNOSIS — M545 Low back pain, unspecified: Secondary | ICD-10-CM | POA: Diagnosis not present

## 2022-05-25 DIAGNOSIS — Z87891 Personal history of nicotine dependence: Secondary | ICD-10-CM | POA: Diagnosis not present

## 2022-05-25 DIAGNOSIS — M549 Dorsalgia, unspecified: Secondary | ICD-10-CM | POA: Diagnosis not present

## 2022-05-25 DIAGNOSIS — Z853 Personal history of malignant neoplasm of breast: Secondary | ICD-10-CM | POA: Diagnosis not present

## 2022-05-25 DIAGNOSIS — S3992XA Unspecified injury of lower back, initial encounter: Secondary | ICD-10-CM | POA: Diagnosis not present

## 2022-05-25 DIAGNOSIS — I2510 Atherosclerotic heart disease of native coronary artery without angina pectoris: Secondary | ICD-10-CM | POA: Diagnosis not present

## 2022-05-25 DIAGNOSIS — Z79899 Other long term (current) drug therapy: Secondary | ICD-10-CM | POA: Diagnosis not present

## 2022-05-25 DIAGNOSIS — I4891 Unspecified atrial fibrillation: Secondary | ICD-10-CM | POA: Diagnosis not present

## 2022-05-25 DIAGNOSIS — R319 Hematuria, unspecified: Secondary | ICD-10-CM | POA: Diagnosis not present

## 2022-05-25 DIAGNOSIS — Z888 Allergy status to other drugs, medicaments and biological substances status: Secondary | ICD-10-CM | POA: Diagnosis not present

## 2022-05-25 DIAGNOSIS — I10 Essential (primary) hypertension: Secondary | ICD-10-CM | POA: Diagnosis not present

## 2022-05-25 DIAGNOSIS — Z7901 Long term (current) use of anticoagulants: Secondary | ICD-10-CM | POA: Diagnosis not present

## 2022-05-27 DIAGNOSIS — S72111A Displaced fracture of greater trochanter of right femur, initial encounter for closed fracture: Secondary | ICD-10-CM | POA: Diagnosis not present

## 2022-05-27 DIAGNOSIS — S72111D Displaced fracture of greater trochanter of right femur, subsequent encounter for closed fracture with routine healing: Secondary | ICD-10-CM | POA: Diagnosis not present

## 2022-05-27 DIAGNOSIS — S72001A Fracture of unspecified part of neck of right femur, initial encounter for closed fracture: Secondary | ICD-10-CM | POA: Diagnosis not present

## 2022-06-08 ENCOUNTER — Other Ambulatory Visit: Payer: Self-pay | Admitting: Internal Medicine

## 2022-06-08 NOTE — Telephone Encounter (Signed)
Medication Refill - Medication: ELIQUIS 2.5 MG TABS tablet   Pt's son called to request a refill   Has the patient contacted their pharmacy? Yes.   (Agent: If no, request that the patient contact the pharmacy for the refill. If patient does not wish to contact the pharmacy document the reason why and proceed with request.) (Agent: If yes, when and what did the pharmacy advise?)  Preferred Pharmacy (with phone number or street name):  [facility] [place], [place] [address] AT [address]  [place] [address]  Phone: [phone] Fax: [phone]   Has the patient been seen for an appointment in the last year OR does the patient have an upcoming appointment? Yes.    Agent: Please be advised that RX refills may take up to 3 business days. We ask that you follow-up with your pharmacy.

## 2022-06-09 DIAGNOSIS — S199XXA Unspecified injury of neck, initial encounter: Secondary | ICD-10-CM | POA: Diagnosis not present

## 2022-06-09 DIAGNOSIS — I10 Essential (primary) hypertension: Secondary | ICD-10-CM | POA: Diagnosis not present

## 2022-06-09 DIAGNOSIS — M810 Age-related osteoporosis without current pathological fracture: Secondary | ICD-10-CM | POA: Diagnosis not present

## 2022-06-09 DIAGNOSIS — M8580 Other specified disorders of bone density and structure, unspecified site: Secondary | ICD-10-CM | POA: Diagnosis not present

## 2022-06-09 DIAGNOSIS — M25761 Osteophyte, right knee: Secondary | ICD-10-CM | POA: Diagnosis not present

## 2022-06-09 DIAGNOSIS — Z79899 Other long term (current) drug therapy: Secondary | ICD-10-CM | POA: Diagnosis not present

## 2022-06-09 DIAGNOSIS — I2510 Atherosclerotic heart disease of native coronary artery without angina pectoris: Secondary | ICD-10-CM | POA: Diagnosis not present

## 2022-06-09 DIAGNOSIS — M25561 Pain in right knee: Secondary | ICD-10-CM | POA: Diagnosis not present

## 2022-06-09 DIAGNOSIS — S06320A Contusion and laceration of left cerebrum without loss of consciousness, initial encounter: Secondary | ICD-10-CM | POA: Diagnosis not present

## 2022-06-09 DIAGNOSIS — G4489 Other headache syndrome: Secondary | ICD-10-CM | POA: Diagnosis not present

## 2022-06-09 DIAGNOSIS — J811 Chronic pulmonary edema: Secondary | ICD-10-CM | POA: Diagnosis not present

## 2022-06-09 DIAGNOSIS — I4891 Unspecified atrial fibrillation: Secondary | ICD-10-CM | POA: Diagnosis not present

## 2022-06-09 DIAGNOSIS — R109 Unspecified abdominal pain: Secondary | ICD-10-CM | POA: Diagnosis not present

## 2022-06-09 DIAGNOSIS — R918 Other nonspecific abnormal finding of lung field: Secondary | ICD-10-CM | POA: Diagnosis not present

## 2022-06-09 DIAGNOSIS — R519 Headache, unspecified: Secondary | ICD-10-CM | POA: Diagnosis not present

## 2022-06-09 DIAGNOSIS — Z9011 Acquired absence of right breast and nipple: Secondary | ICD-10-CM | POA: Diagnosis not present

## 2022-06-09 DIAGNOSIS — W19XXXA Unspecified fall, initial encounter: Secondary | ICD-10-CM | POA: Diagnosis not present

## 2022-06-09 DIAGNOSIS — S72141D Displaced intertrochanteric fracture of right femur, subsequent encounter for closed fracture with routine healing: Secondary | ICD-10-CM | POA: Diagnosis not present

## 2022-06-09 DIAGNOSIS — S0083XA Contusion of other part of head, initial encounter: Secondary | ICD-10-CM | POA: Diagnosis not present

## 2022-06-09 DIAGNOSIS — Z853 Personal history of malignant neoplasm of breast: Secondary | ICD-10-CM | POA: Diagnosis not present

## 2022-06-09 DIAGNOSIS — Z743 Need for continuous supervision: Secondary | ICD-10-CM | POA: Diagnosis not present

## 2022-06-09 DIAGNOSIS — Z7901 Long term (current) use of anticoagulants: Secondary | ICD-10-CM | POA: Diagnosis not present

## 2022-06-09 DIAGNOSIS — Z7982 Long term (current) use of aspirin: Secondary | ICD-10-CM | POA: Diagnosis not present

## 2022-06-09 DIAGNOSIS — M85861 Other specified disorders of bone density and structure, right lower leg: Secondary | ICD-10-CM | POA: Diagnosis not present

## 2022-06-09 DIAGNOSIS — Z043 Encounter for examination and observation following other accident: Secondary | ICD-10-CM | POA: Diagnosis not present

## 2022-06-09 DIAGNOSIS — S72141A Displaced intertrochanteric fracture of right femur, initial encounter for closed fracture: Secondary | ICD-10-CM | POA: Diagnosis not present

## 2022-06-09 DIAGNOSIS — S0990XA Unspecified injury of head, initial encounter: Secondary | ICD-10-CM | POA: Diagnosis not present

## 2022-06-09 MED ORDER — APIXABAN 2.5 MG PO TABS: ORAL_TABLET | ORAL | 0 refills | Status: DC

## 2022-06-09 NOTE — Telephone Encounter (Signed)
Requested Prescriptions  Pending Prescriptions Disp Refills  . apixaban (ELIQUIS) 2.5 MG TABS tablet 180 tablet 0     Hematology:  Anticoagulants - apixaban Passed - 06/08/2022  2:58 PM      Passed - PLT in normal range and within 360 days    Platelets  Date Value Ref Range Status  06/17/2021 322 150 - 400 K/uL Final  06/04/2019 361 150 - 450 x10E3/uL Final         Passed - HGB in normal range and within 360 days    Hemoglobin  Date Value Ref Range Status  06/17/2021 13.0 12.0 - 15.0 g/dL Final  06/04/2019 13.3 11.1 - 15.9 g/dL Final         Passed - HCT in normal range and within 360 days    HCT  Date Value Ref Range Status  06/17/2021 38.6 36.0 - 46.0 % Final   Hematocrit  Date Value Ref Range Status  06/04/2019 39.8 34.0 - 46.6 % Final         Passed - Cr in normal range and within 360 days    Creatinine, Ser  Date Value Ref Range Status  06/17/2021 0.83 0.44 - 1.00 mg/dL Final         Passed - AST in normal range and within 360 days    AST  Date Value Ref Range Status  06/17/2021 16 15 - 41 U/L Final         Passed - ALT in normal range and within 360 days    ALT  Date Value Ref Range Status  06/17/2021 14 0 - 44 U/L Final         Passed - Valid encounter within last 12 months    Recent Outpatient Visits          1 month ago Essential (primary) hypertension   [facility] Glean Hess, MD   11 months ago Essential (primary) hypertension   [facility] Glean Hess, MD   1 year ago Pseudogout of hand, left   [facility] Glean Hess, MD   1 year ago Essential (primary) hypertension   [facility] Glean Hess, MD   1 year ago Pseudogout of right knee   [facility] Glean Hess, MD      Future Appointments            In 4 months Army Melia Jesse Sans, MD [facility], [facility]

## 2022-06-10 ENCOUNTER — Telehealth: Payer: Self-pay | Admitting: Internal Medicine

## 2022-06-10 NOTE — Telephone Encounter (Signed)
Copied from [place] [phone]. Topic: General - Other >> Jun 10, 2022  3:56 PM Ja-Kwan M wrote: Reason for CRM: Pt son Jenny Reichmann reports that pt had a fall and was taken to [facility] in [place] but she was checked and sent home. John stated they were told to make pt pcp aware. Offered to schedule a follow up appt but pt son declined.

## 2022-06-11 NOTE — Telephone Encounter (Signed)
Noted. - Donna Santos 

## 2022-07-03 ENCOUNTER — Other Ambulatory Visit: Payer: Self-pay | Admitting: Internal Medicine

## 2022-07-05 NOTE — Telephone Encounter (Signed)
Requested Prescriptions  Pending Prescriptions Disp Refills  . metoprolol succinate (TOPROL-XL) 50 MG 24 hr tablet [Pharmacy Med Name: METOPROLOL ER SUCCINATE '50MG'$  TABS] 90 tablet 0    Sig: TAKE 1 TABLET BY MOUTH EVERY NIGHT     Cardiovascular:  Beta Blockers Passed - 07/03/2022  8:00 AM      Passed - Last BP in normal range    BP Readings from Last 1 Encounters:  04/30/22 (!) 118/54         Passed - Last Heart Rate in normal range    Pulse Readings from Last 1 Encounters:  04/30/22 74         Passed - Valid encounter within last 6 months    Recent Outpatient Visits          2 months ago Essential (primary) hypertension   [facility] Glean Hess, MD   1 year ago Essential (primary) hypertension   [facility] Glean Hess, MD   1 year ago Pseudogout of hand, left   [facility] Glean Hess, MD   1 year ago Essential (primary) hypertension   [facility] Glean Hess, MD   1 year ago Pseudogout of right knee   [facility] Glean Hess, MD      Future Appointments            In 4 months Army Melia Jesse Sans, MD [facility], [facility]

## 2022-07-06 ENCOUNTER — Ambulatory Visit: Payer: Self-pay

## 2022-07-06 ENCOUNTER — Other Ambulatory Visit: Payer: Self-pay | Admitting: Internal Medicine

## 2022-07-06 NOTE — Telephone Encounter (Signed)
  Chief Complaint: confusion Symptoms: confusion, increased weakness, fall today with no injury, lightheadedness Frequency: ongoing for several days Pertinent Negatives: NA Disposition: '[]'$ ED /'[]'$ Urgent Care (no appt availability in office) / '[x]'$ Appointment(In office/virtual)/ '[]'$  [place] [facility]/ '[]'$ Home Care/ '[]'$ Refused Recommended Disposition /'[]'$ [place]/ '[]'$  Follow-up with PCP Additional Notes: spoke with Jenny Reichmann, pts son. He states the confusion is ongoing just noticing it more worse in the mornings. Pt also stating she gets lightheaded and weak on her knees and having fall. Fall today with no injury and pt was able to get up with little to no help. Maudry Mayhew of scheduling appt and he preferred pt to see Dr. Army Melia. Advised she isn't back until 07/12/22. Scheduled appt for 07/12/22 at 1600 and stressed importance of calling back if symptoms get worse and see someone else for care. He verbalized understanding.   Summary: instability / lightheadedness   The patient's son Jenny Reichmann has called to share that the patient stumbled this morning, fell onto their bed and then slid to the floor 07/06/22   The patient was not injured during their stumble this morning but has experienced increased instability over the past few days   The patient has shared with their son that they feel distant and a general fogginess in the mornings (asking what day it is, or where they're currently located)   Please contact further when possible      Reason for Disposition  [1] Longstanding confusion (e.g., dementia, stroke) AND [2] NO worsening or change  Answer Assessment - Initial Assessment Questions 1. SYMPTOM: "What is the main symptom you are concerned about?" (e.g., weakness, numbness)     Confusion, dizzy  2. ONSET: "When did this start?" (minutes, hours, days; while sleeping)     Ongoing for several days  4. PATTERN "Does this come and go, or has it been constant since it started?"  "Is it  present now?"     Comes and goes  6. NEUROLOGIC SYMPTOMS: "Have you had any of the following symptoms: headache, dizziness, vision loss, double vision, changes in speech, unsteady on your feet?"     Confused more in the mornings, lightheaded, unsteady with feet  Protocols used: Neurologic Deficit-A-AH, Confusion - Delirium-A-AH

## 2022-07-07 NOTE — Telephone Encounter (Signed)
Requested Prescriptions  Pending Prescriptions Disp Refills  . amLODipine (NORVASC) 5 MG tablet [Pharmacy Med Name: AMLODIPINE BESYLATE '5MG'$  TABLETS] 180 tablet 0    Sig: TAKE 1 TABLET(5 MG) BY MOUTH TWICE DAILY     Cardiovascular: Calcium Channel Blockers 2 Passed - 07/06/2022  5:40 PM      Passed - Last BP in normal range    BP Readings from Last 1 Encounters:  04/30/22 (!) 118/54         Passed - Last Heart Rate in normal range    Pulse Readings from Last 1 Encounters:  04/30/22 74         Passed - Valid encounter within last 6 months    Recent Outpatient Visits          2 months ago Essential (primary) hypertension   [facility] Glean Hess, MD   1 year ago Essential (primary) hypertension   [facility] Glean Hess, MD   1 year ago Pseudogout of hand, left   [facility] Glean Hess, MD   1 year ago Essential (primary) hypertension   [facility] Glean Hess, MD   1 year ago Pseudogout of right knee   [facility] Glean Hess, MD      Future Appointments            In 3 months Army Melia Jesse Sans, MD [facility], [facility]

## 2022-07-08 DIAGNOSIS — M1611 Unilateral primary osteoarthritis, right hip: Secondary | ICD-10-CM | POA: Diagnosis not present

## 2022-07-08 DIAGNOSIS — M009 Pyogenic arthritis, unspecified: Secondary | ICD-10-CM | POA: Diagnosis not present

## 2022-07-08 DIAGNOSIS — I10 Essential (primary) hypertension: Secondary | ICD-10-CM | POA: Diagnosis not present

## 2022-07-08 DIAGNOSIS — R29898 Other symptoms and signs involving the musculoskeletal system: Secondary | ICD-10-CM | POA: Diagnosis not present

## 2022-07-08 DIAGNOSIS — S72141D Displaced intertrochanteric fracture of right femur, subsequent encounter for closed fracture with routine healing: Secondary | ICD-10-CM | POA: Diagnosis not present

## 2022-07-08 DIAGNOSIS — I2510 Atherosclerotic heart disease of native coronary artery without angina pectoris: Secondary | ICD-10-CM | POA: Diagnosis not present

## 2022-07-12 ENCOUNTER — Encounter: Payer: Self-pay | Admitting: Internal Medicine

## 2022-07-12 ENCOUNTER — Ambulatory Visit (INDEPENDENT_AMBULATORY_CARE_PROVIDER_SITE_OTHER): Payer: Medicare Other | Admitting: Internal Medicine

## 2022-07-12 VITALS — BP 138/62 | HR 58 | Ht 60.0 in | Wt 117.0 lb

## 2022-07-12 DIAGNOSIS — R8271 Bacteriuria: Secondary | ICD-10-CM

## 2022-07-12 DIAGNOSIS — S72001D Fracture of unspecified part of neck of right femur, subsequent encounter for closed fracture with routine healing: Secondary | ICD-10-CM

## 2022-07-12 DIAGNOSIS — I10 Essential (primary) hypertension: Secondary | ICD-10-CM

## 2022-07-12 DIAGNOSIS — R410 Disorientation, unspecified: Secondary | ICD-10-CM

## 2022-07-12 DIAGNOSIS — R8281 Pyuria: Secondary | ICD-10-CM

## 2022-07-12 HISTORY — DX: Fracture of unspecified part of neck of right femur, subsequent encounter for closed fracture with routine healing: S72.001D

## 2022-07-12 LAB — POCT URINALYSIS DIPSTICK
Bilirubin, UA: NEGATIVE
Blood, UA: NEGATIVE
Glucose, UA: NEGATIVE
Ketones, UA: 15
Nitrite, UA: NEGATIVE
Protein, UA: NEGATIVE
Spec Grav, UA: 1.015 (ref 1.010–1.025)
Urobilinogen, UA: 0.2 E.U./dL
pH, UA: 6 (ref 5.0–8.0)

## 2022-07-12 NOTE — Progress Notes (Signed)
Date:  07/12/2022   Name:  Donna Santos   DOB:  [birth year]   MRN:  [id number]   Chief Complaint: Altered Mental Status (After waking up and taking a nap, or the middle of the night, pts ask where she is ) and Follow-up (Head CT) CT Head 06/09/22: FINDINGS:  There are scattered and confluent hypodense foci within the periventricular and deep white matter.  These are nonspecific but commonly associated with small vessel ischemic changes. There is moderate generalized cerebral volume loss with sulcal prominence and ex vacuo ventricular dilatation.   There is no midline shift. No mass lesion. There is no evidence of acute infarct. No acute intracranial hemorrhage. No fractures are evident. The sinuses are pneumatized. Left parietal soft tissue hematoma (5:45). HPI Mild confusion upon waking that lasts only a few minutes.  She is living with her son and d-I-l currently but wants to go home.  No HA or dysuria.  UA today is positive for leuks.  Right hip fracture - noted to be healing without surgical intervention.  Being followed by Orthopedics.  HTN - less lightheadedness since stopping Losartan and BP remains well controlled.   Lab Results  Component Value Date   NA 136 06/17/2021   K 3.5 06/17/2021   CO2 27 06/17/2021   GLUCOSE 126 (H) 06/17/2021   BUN 16 06/17/2021   CREATININE 0.83 06/17/2021   CALCIUM 9.1 06/17/2021   GFRNONAA >60 06/17/2021   Lab Results  Component Value Date   CHOL 261 (A) 07/24/2013   HDL 44 07/24/2013   LDLCALC 179 07/24/2013   TRIG 192 (A) 07/24/2013   Lab Results  Component Value Date   TSH 3.300 05/02/2019   No results found for: "HGBA1C" Lab Results  Component Value Date   WBC 7.8 06/17/2021   HGB 13.0 06/17/2021   HCT 38.6 06/17/2021   MCV 94.1 06/17/2021   PLT 322 06/17/2021   Lab Results  Component Value Date   ALT 14 06/17/2021   AST 16 06/17/2021   ALKPHOS 38 06/17/2021   BILITOT 0.6 06/17/2021   Lab Results  Component  Value Date   VD25OH 46.0 05/02/2019     Review of Systems  Constitutional:  Negative for activity change, appetite change, diaphoresis, fatigue and fever.  Respiratory:  Negative for chest tightness and shortness of breath.   Cardiovascular:  Negative for chest pain.  Musculoskeletal:  Positive for gait problem (mildly unsteady but no hip pain).  Neurological:  Positive for light-headedness. Negative for dizziness and headaches.  Psychiatric/Behavioral:  Positive for confusion (transient after waking). Negative for sleep disturbance. The patient is not nervous/anxious.     Patient Active Problem List   Diagnosis Date Noted   Acquired thrombophilia ([place]) 09/25/2020   Pseudogout of right knee 08/04/2020   Aortic atherosclerosis ([place]) 07/10/2020   Hyponatremia 06/16/2020   Hypokalemia 12/17/2019   Chest wall recurrence of right breast cancer ([place]) 11/16/2019   Osteoporosis 06/17/2019   Primary open angle glaucoma (POAG) of left eye, moderate stage 04/25/2018   Persistent atrial fibrillation ([place]) 12/28/2016   Carcinoma of overlapping sites of right breast in female, estrogen receptor positive ([place]) 12/21/2016   Senile dementia, uncomplicated ([place]) 61/60/7371   Low back pain 04/12/2016   Impaired renal function 05/09/2015   Arteriosclerosis of coronary artery 05/09/2015   Essential (primary) hypertension 05/09/2015   Personal history of malignant neoplasm of breast 05/09/2015   Muscle spasms of neck 05/09/2015   Arthritis of shoulder region,  degenerative 05/09/2015   Hypercholesteremia 04/20/2012   Carotid artery disease ([place]) 05/28/2005    Allergies  Allergen Reactions   Ace Inhibitors     Other reaction(s): Angioedema   Brinzolamide-Brimonidine     Past Surgical History:  Procedure Laterality Date   ARTHROTOMY KNEE-INFEC W/EXPLOR/DRAIN/REMOV FB Right 02/2022   CAROTID ENDARTERECTOMY     CORONARY ARTERY BYPASS GRAFT  2006   MASS EXCISION Right 08/28/2015   Procedure:  EXCISION MASS; remove massive right chest wall;  Surgeon: Florene Glen, MD;  Location: [facility] ORS;  Service: General;  Laterality: Right;   MASTECTOMY, RADICAL Right 2002   BREAST CA   TOTAL ABDOMINAL HYSTERECTOMY      Social History   Tobacco Use   Smoking status: Former    Packs/day: 1.50    Years: 1.50    Total pack years: 2.25    Types: Cigarettes    Quit date: 10/04/1952    Years since quitting: 69.8   Smokeless tobacco: Never  Vaping Use   Vaping Use: Never used  Substance Use Topics   Alcohol use: No    Alcohol/week: 0.0 standard drinks of alcohol   Drug use: No     Medication list has been reviewed and updated.  Current Meds  Medication Sig   amLODipine (NORVASC) 5 MG tablet TAKE 1 TABLET(5 MG) BY MOUTH TWICE DAILY   apixaban (ELIQUIS) 2.5 MG TABS tablet TAKE 1 TABLET(2.5 MG) BY MOUTH TWICE DAILY   aspirin EC 81 MG tablet Take 81 mg by mouth daily.   Calcium Carb-Cholecalciferol 600-800 MG-UNIT TABS Take 1 tablet by mouth daily. Reported on 12/25/2015   Cholecalciferol (VITAMIN D-3) 125 MCG (5000 UT) TABS Take 1 tablet by mouth daily.   isosorbide mononitrate (IMDUR) 30 MG 24 hr tablet TAKE 1 TABLET(30 MG) BY MOUTH DAILY   latanoprost (XALATAN) 0.005 % ophthalmic solution INT 1 GTT INTO EACH EYE QHS.   metoprolol succinate (TOPROL-XL) 50 MG 24 hr tablet TAKE 1 TABLET BY MOUTH EVERY NIGHT   nitroGLYCERIN (NITROSTAT) 0.4 MG SL tablet Place 0.4 mg under the tongue every 5 (five) minutes as needed for chest pain.   timolol (TIMOPTIC) 0.25 % ophthalmic solution INSTILL ONE DROP INTO BOTH EYES EVERY MORNING.   TURMERIC PO Take 1 tablet by mouth daily.       04/30/2022    1:32 PM 06/29/2021    1:41 PM 02/06/2021   10:59 AM 01/06/2021    1:36 PM  GAD 7 : Generalized Anxiety Score  Nervous, Anxious, on Edge 0 0 0 0  Control/stop worrying 0 1 0 0  Worry too much - different things 0 0 0 0  Trouble relaxing 0 0 0 0  Restless 0 0 0 0  Easily annoyed or irritable 0 0 0 0   Afraid - awful might happen 0 0 0 0  Total GAD 7 Score 0 1 0 0  Anxiety Difficulty Not difficult at all   Not difficult at all       04/30/2022    1:31 PM 01/06/2022    3:31 PM 06/29/2021    1:41 PM  Depression screen PHQ 2/9  Decreased Interest 1 0 0  Down, Depressed, Hopeless 0 0 0  PHQ - 2 Score 1 0 0  Altered sleeping 0  0  Tired, decreased energy 1  1  Change in appetite 1  1  Feeling bad or failure about yourself  0  0  Trouble concentrating 1  2  Moving slowly or fidgety/restless 0  0  Suicidal thoughts 0  0  PHQ-9 Score 4  4  Difficult doing work/chores Not difficult at all  Not difficult at all    BP Readings from Last 3 Encounters:  07/12/22 138/62  04/30/22 (!) 118/54  06/29/21 140/84    Physical Exam Vitals and nursing note reviewed.  Constitutional:      General: She is not in acute distress.    Appearance: Normal appearance. She is well-developed.  HENT:     Head: Normocephalic and atraumatic.  Cardiovascular:     Rate and Rhythm: Normal rate and regular rhythm.  Pulmonary:     Effort: Pulmonary effort is normal. No respiratory distress.     Breath sounds: No wheezing or rhonchi.  Musculoskeletal:        General: Swelling present.     Cervical back: Normal range of motion.     Comments: Mild ankle edema  Lymphadenopathy:     Cervical: No cervical adenopathy.  Skin:    General: Skin is warm and dry.     Capillary Refill: Capillary refill takes less than 2 seconds.     Findings: No rash.  Neurological:     General: No focal deficit present.     Mental Status: She is alert.  Psychiatric:        Mood and Affect: Mood normal.        Behavior: Behavior normal.     Wt Readings from Last 3 Encounters:  07/12/22 117 lb (53.1 kg)  04/30/22 123 lb (55.8 kg)  06/29/21 121 lb (54.9 kg)    BP 138/62   Pulse (!) 58   Ht 5' (1.524 m)   Wt 117 lb (53.1 kg)   SpO2 98%   BMI 22.85 kg/m   Assessment and Plan: 1. Confusion Mild and transient and  likely due to staying at her son's house along with mild white disease related to age seen on CT May also have a UTI contributing - POCT urinalysis dipstick - Urine Culture  2. Essential (primary) hypertension Clinically stable exam with well controlled BP. Tolerating medications without side effects at this time. Pt to continue current regimen and low sodium diet;   3. Bacteriuria with pyuria Await UCx before treating  4. Closed right hip fracture, with routine healing, subsequent encounter Being followed by Ortho   Partially dictated using Editor, commissioning. Any errors are unintentional.  Halina Maidens, MD [facility]  07/12/2022

## 2022-07-14 LAB — URINE CULTURE

## 2022-07-15 NOTE — Progress Notes (Signed)
Called pts son Jenny Reichmann. Left VM to call back.  [facility] nurse may give results to patient if they return call to clinic, a CRM has been created.  KP

## 2022-07-19 ENCOUNTER — Other Ambulatory Visit: Payer: Self-pay | Admitting: Internal Medicine

## 2022-07-19 ENCOUNTER — Telehealth: Payer: Self-pay | Admitting: Internal Medicine

## 2022-07-19 DIAGNOSIS — S72001D Fracture of unspecified part of neck of right femur, subsequent encounter for closed fracture with routine healing: Secondary | ICD-10-CM

## 2022-07-19 DIAGNOSIS — F0390 Unspecified dementia without behavioral disturbance: Secondary | ICD-10-CM

## 2022-07-19 DIAGNOSIS — I10 Essential (primary) hypertension: Secondary | ICD-10-CM

## 2022-07-19 DIAGNOSIS — I4819 Other persistent atrial fibrillation: Secondary | ICD-10-CM

## 2022-07-19 NOTE — Telephone Encounter (Signed)
Copied from [place] [phone]. Topic: Referral - Request for Referral >> Jul 19, 2022 10:42 AM Donna Santos wrote: Has patient seen PCP for this complaint? Yes.   *If NO, is insurance requiring patient see PCP for this issue before PCP can refer them? Referral for which specialty: in home health Preferred provider/office: no preference Donna Santos in the past, when she was in [place] with family.) Reason for referral: Family is trying to transition patient back home and would like to have Santos request for home health to come several times Santos week to check in on her.  She is at home (has been since 8/14 and alone 8/17) at  [address]; [place], [place] [address].  Patient uses the PO Box for mail.  Donna Santos, son, can be reached at [phone] and is the family representative for patient.

## 2022-07-19 NOTE — Telephone Encounter (Signed)
Please review.  KP

## 2022-07-20 NOTE — Telephone Encounter (Signed)
Called, spoke with pt's son Jenny Reichmann. Verbalize understanding.

## 2022-07-20 NOTE — Telephone Encounter (Signed)
Called pts son Jenny Reichmann could not leave VM. VM was full.  KP

## 2022-07-20 NOTE — Telephone Encounter (Signed)
Bosie Helper gave him verbal orders for PT, OT and Nursing. He stated that they will start nursing next week either on 8/28 or 8/29. PT wont start until 9/25 and OT will start 9/1. He verbalized understanding.  KP

## 2022-07-20 NOTE — Telephone Encounter (Signed)
Eric with [facility] called and wanted to verify that the services they could offer at this time would be sufficient for this patient. Please follow up with Randall Hiss.

## 2022-07-21 ENCOUNTER — Telehealth: Payer: Self-pay | Admitting: Internal Medicine

## 2022-07-21 ENCOUNTER — Other Ambulatory Visit: Payer: Self-pay

## 2022-07-21 DIAGNOSIS — M810 Age-related osteoporosis without current pathological fracture: Secondary | ICD-10-CM

## 2022-07-21 DIAGNOSIS — S72001D Fracture of unspecified part of neck of right femur, subsequent encounter for closed fracture with routine healing: Secondary | ICD-10-CM

## 2022-07-21 DIAGNOSIS — R410 Disorientation, unspecified: Secondary | ICD-10-CM

## 2022-07-21 DIAGNOSIS — I10 Essential (primary) hypertension: Secondary | ICD-10-CM

## 2022-07-21 DIAGNOSIS — M11261 Other chondrocalcinosis, right knee: Secondary | ICD-10-CM

## 2022-07-21 DIAGNOSIS — F0390 Unspecified dementia without behavioral disturbance: Secondary | ICD-10-CM

## 2022-07-21 NOTE — Telephone Encounter (Signed)
Copied from [place] [phone]. Topic: General - Other >> Jul 21, 2022  9:50 AM Cyndi Bender wrote: Reason for CRM: Randall Hiss with [facility] requests that Randa Evens return his call for additional information regarding home health order. Cb# [phone]

## 2022-07-21 NOTE — Telephone Encounter (Signed)
New referral placed with all information.  KP

## 2022-07-21 NOTE — Telephone Encounter (Signed)
Called Randall Hiss need to change referral to add more information to why pt needs skilled nursing.  KP

## 2022-07-27 DIAGNOSIS — Z7901 Long term (current) use of anticoagulants: Secondary | ICD-10-CM | POA: Diagnosis not present

## 2022-07-27 DIAGNOSIS — I2510 Atherosclerotic heart disease of native coronary artery without angina pectoris: Secondary | ICD-10-CM | POA: Diagnosis not present

## 2022-07-27 DIAGNOSIS — Z791 Long term (current) use of non-steroidal anti-inflammatories (NSAID): Secondary | ICD-10-CM | POA: Diagnosis not present

## 2022-07-27 DIAGNOSIS — D6869 Other thrombophilia: Secondary | ICD-10-CM | POA: Diagnosis not present

## 2022-07-27 DIAGNOSIS — Z853 Personal history of malignant neoplasm of breast: Secondary | ICD-10-CM | POA: Diagnosis not present

## 2022-07-27 DIAGNOSIS — I4819 Other persistent atrial fibrillation: Secondary | ICD-10-CM | POA: Diagnosis not present

## 2022-07-27 DIAGNOSIS — S72001D Fracture of unspecified part of neck of right femur, subsequent encounter for closed fracture with routine healing: Secondary | ICD-10-CM | POA: Diagnosis not present

## 2022-07-27 DIAGNOSIS — Z9011 Acquired absence of right breast and nipple: Secondary | ICD-10-CM | POA: Diagnosis not present

## 2022-07-27 DIAGNOSIS — Z87891 Personal history of nicotine dependence: Secondary | ICD-10-CM | POA: Diagnosis not present

## 2022-07-27 DIAGNOSIS — I10 Essential (primary) hypertension: Secondary | ICD-10-CM | POA: Diagnosis not present

## 2022-07-27 DIAGNOSIS — H401122 Primary open-angle glaucoma, left eye, moderate stage: Secondary | ICD-10-CM | POA: Diagnosis not present

## 2022-07-27 DIAGNOSIS — I779 Disorder of arteries and arterioles, unspecified: Secondary | ICD-10-CM | POA: Diagnosis not present

## 2022-07-27 DIAGNOSIS — Z951 Presence of aortocoronary bypass graft: Secondary | ICD-10-CM | POA: Diagnosis not present

## 2022-07-27 DIAGNOSIS — M810 Age-related osteoporosis without current pathological fracture: Secondary | ICD-10-CM | POA: Diagnosis not present

## 2022-07-27 DIAGNOSIS — M11261 Other chondrocalcinosis, right knee: Secondary | ICD-10-CM | POA: Diagnosis not present

## 2022-07-27 DIAGNOSIS — M19019 Primary osteoarthritis, unspecified shoulder: Secondary | ICD-10-CM | POA: Diagnosis not present

## 2022-07-29 DIAGNOSIS — M11261 Other chondrocalcinosis, right knee: Secondary | ICD-10-CM | POA: Diagnosis not present

## 2022-07-29 DIAGNOSIS — D6869 Other thrombophilia: Secondary | ICD-10-CM | POA: Diagnosis not present

## 2022-07-29 DIAGNOSIS — I779 Disorder of arteries and arterioles, unspecified: Secondary | ICD-10-CM | POA: Diagnosis not present

## 2022-07-29 DIAGNOSIS — Z951 Presence of aortocoronary bypass graft: Secondary | ICD-10-CM | POA: Diagnosis not present

## 2022-07-29 DIAGNOSIS — H401122 Primary open-angle glaucoma, left eye, moderate stage: Secondary | ICD-10-CM | POA: Diagnosis not present

## 2022-07-29 DIAGNOSIS — I4819 Other persistent atrial fibrillation: Secondary | ICD-10-CM | POA: Diagnosis not present

## 2022-07-29 DIAGNOSIS — I10 Essential (primary) hypertension: Secondary | ICD-10-CM | POA: Diagnosis not present

## 2022-07-29 DIAGNOSIS — Z853 Personal history of malignant neoplasm of breast: Secondary | ICD-10-CM | POA: Diagnosis not present

## 2022-07-29 DIAGNOSIS — I2510 Atherosclerotic heart disease of native coronary artery without angina pectoris: Secondary | ICD-10-CM | POA: Diagnosis not present

## 2022-07-29 DIAGNOSIS — S72001D Fracture of unspecified part of neck of right femur, subsequent encounter for closed fracture with routine healing: Secondary | ICD-10-CM | POA: Diagnosis not present

## 2022-07-29 DIAGNOSIS — Z87891 Personal history of nicotine dependence: Secondary | ICD-10-CM | POA: Diagnosis not present

## 2022-07-29 DIAGNOSIS — Z791 Long term (current) use of non-steroidal anti-inflammatories (NSAID): Secondary | ICD-10-CM | POA: Diagnosis not present

## 2022-07-29 DIAGNOSIS — M19019 Primary osteoarthritis, unspecified shoulder: Secondary | ICD-10-CM | POA: Diagnosis not present

## 2022-07-29 DIAGNOSIS — M810 Age-related osteoporosis without current pathological fracture: Secondary | ICD-10-CM | POA: Diagnosis not present

## 2022-07-29 DIAGNOSIS — Z7901 Long term (current) use of anticoagulants: Secondary | ICD-10-CM | POA: Diagnosis not present

## 2022-07-29 DIAGNOSIS — Z9011 Acquired absence of right breast and nipple: Secondary | ICD-10-CM | POA: Diagnosis not present

## 2022-07-30 ENCOUNTER — Other Ambulatory Visit (INDEPENDENT_AMBULATORY_CARE_PROVIDER_SITE_OTHER): Payer: Medicare Other | Admitting: Internal Medicine

## 2022-07-30 ENCOUNTER — Telehealth: Payer: Self-pay | Admitting: Internal Medicine

## 2022-07-30 DIAGNOSIS — I6529 Occlusion and stenosis of unspecified carotid artery: Secondary | ICD-10-CM

## 2022-07-30 DIAGNOSIS — Z951 Presence of aortocoronary bypass graft: Secondary | ICD-10-CM | POA: Diagnosis not present

## 2022-07-30 DIAGNOSIS — M19019 Primary osteoarthritis, unspecified shoulder: Secondary | ICD-10-CM | POA: Diagnosis not present

## 2022-07-30 DIAGNOSIS — D6869 Other thrombophilia: Secondary | ICD-10-CM

## 2022-07-30 DIAGNOSIS — I779 Disorder of arteries and arterioles, unspecified: Secondary | ICD-10-CM | POA: Diagnosis not present

## 2022-07-30 DIAGNOSIS — I4819 Other persistent atrial fibrillation: Secondary | ICD-10-CM

## 2022-07-30 DIAGNOSIS — F0390 Unspecified dementia without behavioral disturbance: Secondary | ICD-10-CM | POA: Diagnosis not present

## 2022-07-30 DIAGNOSIS — S72001D Fracture of unspecified part of neck of right femur, subsequent encounter for closed fracture with routine healing: Secondary | ICD-10-CM | POA: Diagnosis not present

## 2022-07-30 DIAGNOSIS — I10 Essential (primary) hypertension: Secondary | ICD-10-CM | POA: Diagnosis not present

## 2022-07-30 DIAGNOSIS — M11261 Other chondrocalcinosis, right knee: Secondary | ICD-10-CM | POA: Diagnosis not present

## 2022-07-30 DIAGNOSIS — M810 Age-related osteoporosis without current pathological fracture: Secondary | ICD-10-CM

## 2022-07-30 DIAGNOSIS — I700 Atherosclerosis of aorta: Secondary | ICD-10-CM

## 2022-07-30 DIAGNOSIS — M19011 Primary osteoarthritis, right shoulder: Secondary | ICD-10-CM

## 2022-07-30 DIAGNOSIS — Z791 Long term (current) use of non-steroidal anti-inflammatories (NSAID): Secondary | ICD-10-CM | POA: Diagnosis not present

## 2022-07-30 DIAGNOSIS — Z9011 Acquired absence of right breast and nipple: Secondary | ICD-10-CM | POA: Diagnosis not present

## 2022-07-30 DIAGNOSIS — I2510 Atherosclerotic heart disease of native coronary artery without angina pectoris: Secondary | ICD-10-CM | POA: Diagnosis not present

## 2022-07-30 DIAGNOSIS — H401122 Primary open-angle glaucoma, left eye, moderate stage: Secondary | ICD-10-CM | POA: Diagnosis not present

## 2022-07-30 DIAGNOSIS — M19012 Primary osteoarthritis, left shoulder: Secondary | ICD-10-CM

## 2022-07-30 DIAGNOSIS — Z170 Estrogen receptor positive status [ER+]: Secondary | ICD-10-CM

## 2022-07-30 DIAGNOSIS — C50811 Malignant neoplasm of overlapping sites of right female breast: Secondary | ICD-10-CM

## 2022-07-30 DIAGNOSIS — N289 Disorder of kidney and ureter, unspecified: Secondary | ICD-10-CM

## 2022-07-30 DIAGNOSIS — Z853 Personal history of malignant neoplasm of breast: Secondary | ICD-10-CM | POA: Diagnosis not present

## 2022-07-30 DIAGNOSIS — Z7901 Long term (current) use of anticoagulants: Secondary | ICD-10-CM | POA: Diagnosis not present

## 2022-07-30 DIAGNOSIS — E7800 Pure hypercholesterolemia, unspecified: Secondary | ICD-10-CM

## 2022-07-30 DIAGNOSIS — Z87891 Personal history of nicotine dependence: Secondary | ICD-10-CM | POA: Diagnosis not present

## 2022-07-30 NOTE — Progress Notes (Signed)
Received home health orders orders from [facility]. Start of care 07/27/22.   Certification and orders from 07/27/22 through 09/24/22 are reviewed, signed and faxed back to home health company.  Need of intermittent skilled services at home: home bound  The home health care plan has been established by me and will be reviewed and updated as needed to maximize patient recovery.  I certify that all home health services have been and will be furnished to the patient while under my care.  Face-to-face encounter in which the need for home health services was established: 07/12/22.  Patient is receiving home health services for the following diagnoses: Problem List Items Addressed This Visit       Cardiovascular and Mediastinum   Aortic atherosclerosis ([place])   Arteriosclerosis of coronary artery (Chronic)   Carotid artery disease (HCC)   Essential (primary) hypertension (Chronic)   Persistent atrial fibrillation (HCC) (Chronic)     Nervous and Auditory   Senile dementia, uncomplicated (HCC) - Primary (Chronic)     Musculoskeletal and Integument   Arthritis of shoulder region, degenerative   Closed right hip fracture, with routine healing, subsequent encounter   Osteoporosis   Pseudogout of right knee (Chronic)     Genitourinary   Impaired renal function (Chronic)     Hematopoietic and Hemostatic   Acquired thrombophilia ([place]) (Chronic)     Other   Carcinoma of overlapping sites of right breast in female, estrogen receptor positive (HCC)   Hypercholesteremia     Halina Maidens, MD

## 2022-07-30 NOTE — Telephone Encounter (Signed)
Spoke to [employer] gave her verbal orders. She verbalized understanding.  KP

## 2022-07-30 NOTE — Telephone Encounter (Signed)
Copied from [place] [phone]. Topic: Quick Communication - Home Health Verbal Orders >> Jul 30, 2022  3:39 PM Everette C wrote: Caller/Agency: Maudie Mercury / [place] Number: [phone] Requesting OT/PT/Skilled Nursing/Social Work/Speech Therapy: OT Frequency: 1w1 2w4 starting 07/30/22

## 2022-08-02 ENCOUNTER — Other Ambulatory Visit: Payer: Self-pay | Admitting: Internal Medicine

## 2022-08-03 DIAGNOSIS — S72001D Fracture of unspecified part of neck of right femur, subsequent encounter for closed fracture with routine healing: Secondary | ICD-10-CM | POA: Diagnosis not present

## 2022-08-03 DIAGNOSIS — Z853 Personal history of malignant neoplasm of breast: Secondary | ICD-10-CM | POA: Diagnosis not present

## 2022-08-03 DIAGNOSIS — I779 Disorder of arteries and arterioles, unspecified: Secondary | ICD-10-CM | POA: Diagnosis not present

## 2022-08-03 DIAGNOSIS — Z791 Long term (current) use of non-steroidal anti-inflammatories (NSAID): Secondary | ICD-10-CM | POA: Diagnosis not present

## 2022-08-03 DIAGNOSIS — I2510 Atherosclerotic heart disease of native coronary artery without angina pectoris: Secondary | ICD-10-CM | POA: Diagnosis not present

## 2022-08-03 DIAGNOSIS — Z951 Presence of aortocoronary bypass graft: Secondary | ICD-10-CM | POA: Diagnosis not present

## 2022-08-03 DIAGNOSIS — Z87891 Personal history of nicotine dependence: Secondary | ICD-10-CM | POA: Diagnosis not present

## 2022-08-03 DIAGNOSIS — M11261 Other chondrocalcinosis, right knee: Secondary | ICD-10-CM | POA: Diagnosis not present

## 2022-08-03 DIAGNOSIS — D6869 Other thrombophilia: Secondary | ICD-10-CM | POA: Diagnosis not present

## 2022-08-03 DIAGNOSIS — H401122 Primary open-angle glaucoma, left eye, moderate stage: Secondary | ICD-10-CM | POA: Diagnosis not present

## 2022-08-03 DIAGNOSIS — Z7901 Long term (current) use of anticoagulants: Secondary | ICD-10-CM | POA: Diagnosis not present

## 2022-08-03 DIAGNOSIS — M810 Age-related osteoporosis without current pathological fracture: Secondary | ICD-10-CM | POA: Diagnosis not present

## 2022-08-03 DIAGNOSIS — I10 Essential (primary) hypertension: Secondary | ICD-10-CM | POA: Diagnosis not present

## 2022-08-03 DIAGNOSIS — I4819 Other persistent atrial fibrillation: Secondary | ICD-10-CM | POA: Diagnosis not present

## 2022-08-03 DIAGNOSIS — M19019 Primary osteoarthritis, unspecified shoulder: Secondary | ICD-10-CM | POA: Diagnosis not present

## 2022-08-03 DIAGNOSIS — Z9011 Acquired absence of right breast and nipple: Secondary | ICD-10-CM | POA: Diagnosis not present

## 2022-08-04 DIAGNOSIS — S72001D Fracture of unspecified part of neck of right femur, subsequent encounter for closed fracture with routine healing: Secondary | ICD-10-CM | POA: Diagnosis not present

## 2022-08-04 DIAGNOSIS — Z7901 Long term (current) use of anticoagulants: Secondary | ICD-10-CM | POA: Diagnosis not present

## 2022-08-04 DIAGNOSIS — H401122 Primary open-angle glaucoma, left eye, moderate stage: Secondary | ICD-10-CM | POA: Diagnosis not present

## 2022-08-04 DIAGNOSIS — I2510 Atherosclerotic heart disease of native coronary artery without angina pectoris: Secondary | ICD-10-CM | POA: Diagnosis not present

## 2022-08-04 DIAGNOSIS — I4819 Other persistent atrial fibrillation: Secondary | ICD-10-CM | POA: Diagnosis not present

## 2022-08-04 DIAGNOSIS — M19019 Primary osteoarthritis, unspecified shoulder: Secondary | ICD-10-CM | POA: Diagnosis not present

## 2022-08-04 DIAGNOSIS — Z853 Personal history of malignant neoplasm of breast: Secondary | ICD-10-CM | POA: Diagnosis not present

## 2022-08-04 DIAGNOSIS — Z791 Long term (current) use of non-steroidal anti-inflammatories (NSAID): Secondary | ICD-10-CM | POA: Diagnosis not present

## 2022-08-04 DIAGNOSIS — M11261 Other chondrocalcinosis, right knee: Secondary | ICD-10-CM | POA: Diagnosis not present

## 2022-08-04 DIAGNOSIS — Z951 Presence of aortocoronary bypass graft: Secondary | ICD-10-CM | POA: Diagnosis not present

## 2022-08-04 DIAGNOSIS — I779 Disorder of arteries and arterioles, unspecified: Secondary | ICD-10-CM | POA: Diagnosis not present

## 2022-08-04 DIAGNOSIS — I10 Essential (primary) hypertension: Secondary | ICD-10-CM | POA: Diagnosis not present

## 2022-08-04 DIAGNOSIS — Z9011 Acquired absence of right breast and nipple: Secondary | ICD-10-CM | POA: Diagnosis not present

## 2022-08-04 DIAGNOSIS — D6869 Other thrombophilia: Secondary | ICD-10-CM | POA: Diagnosis not present

## 2022-08-04 DIAGNOSIS — Z87891 Personal history of nicotine dependence: Secondary | ICD-10-CM | POA: Diagnosis not present

## 2022-08-04 DIAGNOSIS — M810 Age-related osteoporosis without current pathological fracture: Secondary | ICD-10-CM | POA: Diagnosis not present

## 2022-08-04 NOTE — Telephone Encounter (Signed)
Requested medication (s) are due for refill today: no  Requested medication (s) are on the active medication list: no  Last refill:  07/12/22, discontinued  Future visit scheduled:yes  Notes to clinic:  Unable to refill per protocol, Rx expired.  Medication was discontinued 07/12/22 by PCP.     Requested Prescriptions  Pending Prescriptions Disp Refills   losartan (COZAAR) 25 MG tablet [Pharmacy Med Name: LOSARTAN '25MG'$  TABLETS] 90 tablet 0    Sig: TAKE 1 TABLET(25 MG) BY MOUTH DAILY     Cardiovascular:  Angiotensin Receptor Blockers Failed - 08/02/2022  9:36 PM      Failed - Cr in normal range and within 180 days    Creatinine, Ser  Date Value Ref Range Status  06/17/2021 0.83 0.44 - 1.00 mg/dL Final         Failed - K in normal range and within 180 days    Potassium  Date Value Ref Range Status  06/17/2021 3.5 3.5 - 5.1 mmol/L Final         Passed - Patient is not pregnant      Passed - Last BP in normal range    BP Readings from Last 1 Encounters:  07/12/22 138/62         Passed - Valid encounter within last 6 months    Recent Outpatient Visits           3 weeks ago Confusion   [place] [facility] at [facility], Jesse Sans, MD   3 months ago Essential (primary) hypertension   [place] [facility] at [facility], Jesse Sans, MD   1 year ago Essential (primary) hypertension   [place] [facility] at [facility], Jesse Sans, MD   1 year ago Pseudogout of hand, left   [place] [facility] at [facility], Jesse Sans, MD   1 year ago Essential (primary) hypertension    [facility] and [facility] Medicine at [facility], Jesse Sans, MD       Future Appointments             In 3 months Army Melia, Jesse Sans, MD [place] and Sports Medicine at [facility], [facility]

## 2022-08-05 DIAGNOSIS — M810 Age-related osteoporosis without current pathological fracture: Secondary | ICD-10-CM | POA: Diagnosis not present

## 2022-08-05 DIAGNOSIS — M19019 Primary osteoarthritis, unspecified shoulder: Secondary | ICD-10-CM | POA: Diagnosis not present

## 2022-08-05 DIAGNOSIS — D6869 Other thrombophilia: Secondary | ICD-10-CM | POA: Diagnosis not present

## 2022-08-05 DIAGNOSIS — Z7901 Long term (current) use of anticoagulants: Secondary | ICD-10-CM | POA: Diagnosis not present

## 2022-08-05 DIAGNOSIS — I4819 Other persistent atrial fibrillation: Secondary | ICD-10-CM | POA: Diagnosis not present

## 2022-08-05 DIAGNOSIS — I2510 Atherosclerotic heart disease of native coronary artery without angina pectoris: Secondary | ICD-10-CM | POA: Diagnosis not present

## 2022-08-05 DIAGNOSIS — I10 Essential (primary) hypertension: Secondary | ICD-10-CM | POA: Diagnosis not present

## 2022-08-05 DIAGNOSIS — Z87891 Personal history of nicotine dependence: Secondary | ICD-10-CM | POA: Diagnosis not present

## 2022-08-05 DIAGNOSIS — H401122 Primary open-angle glaucoma, left eye, moderate stage: Secondary | ICD-10-CM | POA: Diagnosis not present

## 2022-08-05 DIAGNOSIS — Z9011 Acquired absence of right breast and nipple: Secondary | ICD-10-CM | POA: Diagnosis not present

## 2022-08-05 DIAGNOSIS — Z951 Presence of aortocoronary bypass graft: Secondary | ICD-10-CM | POA: Diagnosis not present

## 2022-08-05 DIAGNOSIS — Z853 Personal history of malignant neoplasm of breast: Secondary | ICD-10-CM | POA: Diagnosis not present

## 2022-08-05 DIAGNOSIS — S72001D Fracture of unspecified part of neck of right femur, subsequent encounter for closed fracture with routine healing: Secondary | ICD-10-CM | POA: Diagnosis not present

## 2022-08-05 DIAGNOSIS — I779 Disorder of arteries and arterioles, unspecified: Secondary | ICD-10-CM | POA: Diagnosis not present

## 2022-08-05 DIAGNOSIS — M11261 Other chondrocalcinosis, right knee: Secondary | ICD-10-CM | POA: Diagnosis not present

## 2022-08-05 DIAGNOSIS — Z791 Long term (current) use of non-steroidal anti-inflammatories (NSAID): Secondary | ICD-10-CM | POA: Diagnosis not present

## 2022-08-06 DIAGNOSIS — Z951 Presence of aortocoronary bypass graft: Secondary | ICD-10-CM | POA: Diagnosis not present

## 2022-08-06 DIAGNOSIS — I2510 Atherosclerotic heart disease of native coronary artery without angina pectoris: Secondary | ICD-10-CM | POA: Diagnosis not present

## 2022-08-06 DIAGNOSIS — S72001D Fracture of unspecified part of neck of right femur, subsequent encounter for closed fracture with routine healing: Secondary | ICD-10-CM | POA: Diagnosis not present

## 2022-08-06 DIAGNOSIS — M11261 Other chondrocalcinosis, right knee: Secondary | ICD-10-CM | POA: Diagnosis not present

## 2022-08-06 DIAGNOSIS — Z7901 Long term (current) use of anticoagulants: Secondary | ICD-10-CM | POA: Diagnosis not present

## 2022-08-06 DIAGNOSIS — Z9011 Acquired absence of right breast and nipple: Secondary | ICD-10-CM | POA: Diagnosis not present

## 2022-08-06 DIAGNOSIS — I779 Disorder of arteries and arterioles, unspecified: Secondary | ICD-10-CM | POA: Diagnosis not present

## 2022-08-06 DIAGNOSIS — I4819 Other persistent atrial fibrillation: Secondary | ICD-10-CM | POA: Diagnosis not present

## 2022-08-06 DIAGNOSIS — M19019 Primary osteoarthritis, unspecified shoulder: Secondary | ICD-10-CM | POA: Diagnosis not present

## 2022-08-06 DIAGNOSIS — Z87891 Personal history of nicotine dependence: Secondary | ICD-10-CM | POA: Diagnosis not present

## 2022-08-06 DIAGNOSIS — Z853 Personal history of malignant neoplasm of breast: Secondary | ICD-10-CM | POA: Diagnosis not present

## 2022-08-06 DIAGNOSIS — M810 Age-related osteoporosis without current pathological fracture: Secondary | ICD-10-CM | POA: Diagnosis not present

## 2022-08-06 DIAGNOSIS — I10 Essential (primary) hypertension: Secondary | ICD-10-CM | POA: Diagnosis not present

## 2022-08-06 DIAGNOSIS — D6869 Other thrombophilia: Secondary | ICD-10-CM | POA: Diagnosis not present

## 2022-08-06 DIAGNOSIS — H401122 Primary open-angle glaucoma, left eye, moderate stage: Secondary | ICD-10-CM | POA: Diagnosis not present

## 2022-08-06 DIAGNOSIS — Z791 Long term (current) use of non-steroidal anti-inflammatories (NSAID): Secondary | ICD-10-CM | POA: Diagnosis not present

## 2022-08-09 DIAGNOSIS — I10 Essential (primary) hypertension: Secondary | ICD-10-CM | POA: Diagnosis not present

## 2022-08-09 DIAGNOSIS — H401122 Primary open-angle glaucoma, left eye, moderate stage: Secondary | ICD-10-CM | POA: Diagnosis not present

## 2022-08-09 DIAGNOSIS — Z791 Long term (current) use of non-steroidal anti-inflammatories (NSAID): Secondary | ICD-10-CM | POA: Diagnosis not present

## 2022-08-09 DIAGNOSIS — M810 Age-related osteoporosis without current pathological fracture: Secondary | ICD-10-CM | POA: Diagnosis not present

## 2022-08-09 DIAGNOSIS — Z87891 Personal history of nicotine dependence: Secondary | ICD-10-CM | POA: Diagnosis not present

## 2022-08-09 DIAGNOSIS — Z9011 Acquired absence of right breast and nipple: Secondary | ICD-10-CM | POA: Diagnosis not present

## 2022-08-09 DIAGNOSIS — Z951 Presence of aortocoronary bypass graft: Secondary | ICD-10-CM | POA: Diagnosis not present

## 2022-08-09 DIAGNOSIS — M11261 Other chondrocalcinosis, right knee: Secondary | ICD-10-CM | POA: Diagnosis not present

## 2022-08-09 DIAGNOSIS — I779 Disorder of arteries and arterioles, unspecified: Secondary | ICD-10-CM | POA: Diagnosis not present

## 2022-08-09 DIAGNOSIS — I4819 Other persistent atrial fibrillation: Secondary | ICD-10-CM | POA: Diagnosis not present

## 2022-08-09 DIAGNOSIS — Z7901 Long term (current) use of anticoagulants: Secondary | ICD-10-CM | POA: Diagnosis not present

## 2022-08-09 DIAGNOSIS — I2510 Atherosclerotic heart disease of native coronary artery without angina pectoris: Secondary | ICD-10-CM | POA: Diagnosis not present

## 2022-08-09 DIAGNOSIS — D6869 Other thrombophilia: Secondary | ICD-10-CM | POA: Diagnosis not present

## 2022-08-09 DIAGNOSIS — S72001D Fracture of unspecified part of neck of right femur, subsequent encounter for closed fracture with routine healing: Secondary | ICD-10-CM | POA: Diagnosis not present

## 2022-08-09 DIAGNOSIS — M19019 Primary osteoarthritis, unspecified shoulder: Secondary | ICD-10-CM | POA: Diagnosis not present

## 2022-08-09 DIAGNOSIS — Z853 Personal history of malignant neoplasm of breast: Secondary | ICD-10-CM | POA: Diagnosis not present

## 2022-08-10 DIAGNOSIS — I10 Essential (primary) hypertension: Secondary | ICD-10-CM | POA: Diagnosis not present

## 2022-08-10 DIAGNOSIS — Z853 Personal history of malignant neoplasm of breast: Secondary | ICD-10-CM | POA: Diagnosis not present

## 2022-08-10 DIAGNOSIS — S72001D Fracture of unspecified part of neck of right femur, subsequent encounter for closed fracture with routine healing: Secondary | ICD-10-CM | POA: Diagnosis not present

## 2022-08-10 DIAGNOSIS — M11261 Other chondrocalcinosis, right knee: Secondary | ICD-10-CM | POA: Diagnosis not present

## 2022-08-10 DIAGNOSIS — M810 Age-related osteoporosis without current pathological fracture: Secondary | ICD-10-CM | POA: Diagnosis not present

## 2022-08-10 DIAGNOSIS — M19019 Primary osteoarthritis, unspecified shoulder: Secondary | ICD-10-CM | POA: Diagnosis not present

## 2022-08-10 DIAGNOSIS — I4819 Other persistent atrial fibrillation: Secondary | ICD-10-CM | POA: Diagnosis not present

## 2022-08-10 DIAGNOSIS — D6869 Other thrombophilia: Secondary | ICD-10-CM | POA: Diagnosis not present

## 2022-08-10 DIAGNOSIS — Z7901 Long term (current) use of anticoagulants: Secondary | ICD-10-CM | POA: Diagnosis not present

## 2022-08-10 DIAGNOSIS — Z87891 Personal history of nicotine dependence: Secondary | ICD-10-CM | POA: Diagnosis not present

## 2022-08-10 DIAGNOSIS — H401122 Primary open-angle glaucoma, left eye, moderate stage: Secondary | ICD-10-CM | POA: Diagnosis not present

## 2022-08-10 DIAGNOSIS — I2510 Atherosclerotic heart disease of native coronary artery without angina pectoris: Secondary | ICD-10-CM | POA: Diagnosis not present

## 2022-08-10 DIAGNOSIS — Z951 Presence of aortocoronary bypass graft: Secondary | ICD-10-CM | POA: Diagnosis not present

## 2022-08-10 DIAGNOSIS — Z9011 Acquired absence of right breast and nipple: Secondary | ICD-10-CM | POA: Diagnosis not present

## 2022-08-10 DIAGNOSIS — Z791 Long term (current) use of non-steroidal anti-inflammatories (NSAID): Secondary | ICD-10-CM | POA: Diagnosis not present

## 2022-08-10 DIAGNOSIS — I779 Disorder of arteries and arterioles, unspecified: Secondary | ICD-10-CM | POA: Diagnosis not present

## 2022-08-11 DIAGNOSIS — D6869 Other thrombophilia: Secondary | ICD-10-CM | POA: Diagnosis not present

## 2022-08-11 DIAGNOSIS — M11261 Other chondrocalcinosis, right knee: Secondary | ICD-10-CM | POA: Diagnosis not present

## 2022-08-11 DIAGNOSIS — Z7901 Long term (current) use of anticoagulants: Secondary | ICD-10-CM | POA: Diagnosis not present

## 2022-08-11 DIAGNOSIS — I779 Disorder of arteries and arterioles, unspecified: Secondary | ICD-10-CM | POA: Diagnosis not present

## 2022-08-11 DIAGNOSIS — Z9011 Acquired absence of right breast and nipple: Secondary | ICD-10-CM | POA: Diagnosis not present

## 2022-08-11 DIAGNOSIS — I4819 Other persistent atrial fibrillation: Secondary | ICD-10-CM | POA: Diagnosis not present

## 2022-08-11 DIAGNOSIS — Z853 Personal history of malignant neoplasm of breast: Secondary | ICD-10-CM | POA: Diagnosis not present

## 2022-08-11 DIAGNOSIS — M810 Age-related osteoporosis without current pathological fracture: Secondary | ICD-10-CM | POA: Diagnosis not present

## 2022-08-11 DIAGNOSIS — Z791 Long term (current) use of non-steroidal anti-inflammatories (NSAID): Secondary | ICD-10-CM | POA: Diagnosis not present

## 2022-08-11 DIAGNOSIS — M19019 Primary osteoarthritis, unspecified shoulder: Secondary | ICD-10-CM | POA: Diagnosis not present

## 2022-08-11 DIAGNOSIS — S72001D Fracture of unspecified part of neck of right femur, subsequent encounter for closed fracture with routine healing: Secondary | ICD-10-CM | POA: Diagnosis not present

## 2022-08-11 DIAGNOSIS — H401122 Primary open-angle glaucoma, left eye, moderate stage: Secondary | ICD-10-CM | POA: Diagnosis not present

## 2022-08-11 DIAGNOSIS — I10 Essential (primary) hypertension: Secondary | ICD-10-CM | POA: Diagnosis not present

## 2022-08-11 DIAGNOSIS — I2510 Atherosclerotic heart disease of native coronary artery without angina pectoris: Secondary | ICD-10-CM | POA: Diagnosis not present

## 2022-08-11 DIAGNOSIS — Z951 Presence of aortocoronary bypass graft: Secondary | ICD-10-CM | POA: Diagnosis not present

## 2022-08-11 DIAGNOSIS — Z87891 Personal history of nicotine dependence: Secondary | ICD-10-CM | POA: Diagnosis not present

## 2022-08-12 DIAGNOSIS — I4819 Other persistent atrial fibrillation: Secondary | ICD-10-CM | POA: Diagnosis not present

## 2022-08-12 DIAGNOSIS — S72001D Fracture of unspecified part of neck of right femur, subsequent encounter for closed fracture with routine healing: Secondary | ICD-10-CM | POA: Diagnosis not present

## 2022-08-12 DIAGNOSIS — Z7901 Long term (current) use of anticoagulants: Secondary | ICD-10-CM | POA: Diagnosis not present

## 2022-08-12 DIAGNOSIS — Z87891 Personal history of nicotine dependence: Secondary | ICD-10-CM | POA: Diagnosis not present

## 2022-08-12 DIAGNOSIS — M11261 Other chondrocalcinosis, right knee: Secondary | ICD-10-CM | POA: Diagnosis not present

## 2022-08-12 DIAGNOSIS — M19019 Primary osteoarthritis, unspecified shoulder: Secondary | ICD-10-CM | POA: Diagnosis not present

## 2022-08-12 DIAGNOSIS — I2510 Atherosclerotic heart disease of native coronary artery without angina pectoris: Secondary | ICD-10-CM | POA: Diagnosis not present

## 2022-08-12 DIAGNOSIS — D6869 Other thrombophilia: Secondary | ICD-10-CM | POA: Diagnosis not present

## 2022-08-12 DIAGNOSIS — Z9011 Acquired absence of right breast and nipple: Secondary | ICD-10-CM | POA: Diagnosis not present

## 2022-08-12 DIAGNOSIS — Z853 Personal history of malignant neoplasm of breast: Secondary | ICD-10-CM | POA: Diagnosis not present

## 2022-08-12 DIAGNOSIS — H401122 Primary open-angle glaucoma, left eye, moderate stage: Secondary | ICD-10-CM | POA: Diagnosis not present

## 2022-08-12 DIAGNOSIS — Z791 Long term (current) use of non-steroidal anti-inflammatories (NSAID): Secondary | ICD-10-CM | POA: Diagnosis not present

## 2022-08-12 DIAGNOSIS — M810 Age-related osteoporosis without current pathological fracture: Secondary | ICD-10-CM | POA: Diagnosis not present

## 2022-08-12 DIAGNOSIS — Z951 Presence of aortocoronary bypass graft: Secondary | ICD-10-CM | POA: Diagnosis not present

## 2022-08-12 DIAGNOSIS — I779 Disorder of arteries and arterioles, unspecified: Secondary | ICD-10-CM | POA: Diagnosis not present

## 2022-08-12 DIAGNOSIS — I10 Essential (primary) hypertension: Secondary | ICD-10-CM | POA: Diagnosis not present

## 2022-08-13 ENCOUNTER — Other Ambulatory Visit: Payer: Self-pay | Admitting: Internal Medicine

## 2022-08-13 DIAGNOSIS — I10 Essential (primary) hypertension: Secondary | ICD-10-CM | POA: Diagnosis not present

## 2022-08-13 DIAGNOSIS — M19019 Primary osteoarthritis, unspecified shoulder: Secondary | ICD-10-CM | POA: Diagnosis not present

## 2022-08-13 DIAGNOSIS — H401122 Primary open-angle glaucoma, left eye, moderate stage: Secondary | ICD-10-CM | POA: Diagnosis not present

## 2022-08-13 DIAGNOSIS — S72001D Fracture of unspecified part of neck of right femur, subsequent encounter for closed fracture with routine healing: Secondary | ICD-10-CM | POA: Diagnosis not present

## 2022-08-13 DIAGNOSIS — I779 Disorder of arteries and arterioles, unspecified: Secondary | ICD-10-CM | POA: Diagnosis not present

## 2022-08-13 DIAGNOSIS — I2510 Atherosclerotic heart disease of native coronary artery without angina pectoris: Secondary | ICD-10-CM

## 2022-08-13 DIAGNOSIS — Z7901 Long term (current) use of anticoagulants: Secondary | ICD-10-CM | POA: Diagnosis not present

## 2022-08-13 DIAGNOSIS — Z9011 Acquired absence of right breast and nipple: Secondary | ICD-10-CM | POA: Diagnosis not present

## 2022-08-13 DIAGNOSIS — D6869 Other thrombophilia: Secondary | ICD-10-CM | POA: Diagnosis not present

## 2022-08-13 DIAGNOSIS — Z951 Presence of aortocoronary bypass graft: Secondary | ICD-10-CM | POA: Diagnosis not present

## 2022-08-13 DIAGNOSIS — M810 Age-related osteoporosis without current pathological fracture: Secondary | ICD-10-CM | POA: Diagnosis not present

## 2022-08-13 DIAGNOSIS — Z853 Personal history of malignant neoplasm of breast: Secondary | ICD-10-CM | POA: Diagnosis not present

## 2022-08-13 DIAGNOSIS — I4819 Other persistent atrial fibrillation: Secondary | ICD-10-CM | POA: Diagnosis not present

## 2022-08-13 DIAGNOSIS — Z791 Long term (current) use of non-steroidal anti-inflammatories (NSAID): Secondary | ICD-10-CM | POA: Diagnosis not present

## 2022-08-13 DIAGNOSIS — M11261 Other chondrocalcinosis, right knee: Secondary | ICD-10-CM | POA: Diagnosis not present

## 2022-08-13 DIAGNOSIS — Z87891 Personal history of nicotine dependence: Secondary | ICD-10-CM | POA: Diagnosis not present

## 2022-08-16 DIAGNOSIS — Z7901 Long term (current) use of anticoagulants: Secondary | ICD-10-CM | POA: Diagnosis not present

## 2022-08-16 DIAGNOSIS — I779 Disorder of arteries and arterioles, unspecified: Secondary | ICD-10-CM | POA: Diagnosis not present

## 2022-08-16 DIAGNOSIS — H401122 Primary open-angle glaucoma, left eye, moderate stage: Secondary | ICD-10-CM | POA: Diagnosis not present

## 2022-08-16 DIAGNOSIS — M19019 Primary osteoarthritis, unspecified shoulder: Secondary | ICD-10-CM | POA: Diagnosis not present

## 2022-08-16 DIAGNOSIS — M11261 Other chondrocalcinosis, right knee: Secondary | ICD-10-CM | POA: Diagnosis not present

## 2022-08-16 DIAGNOSIS — Z951 Presence of aortocoronary bypass graft: Secondary | ICD-10-CM | POA: Diagnosis not present

## 2022-08-16 DIAGNOSIS — D6869 Other thrombophilia: Secondary | ICD-10-CM | POA: Diagnosis not present

## 2022-08-16 DIAGNOSIS — M810 Age-related osteoporosis without current pathological fracture: Secondary | ICD-10-CM | POA: Diagnosis not present

## 2022-08-16 DIAGNOSIS — Z791 Long term (current) use of non-steroidal anti-inflammatories (NSAID): Secondary | ICD-10-CM | POA: Diagnosis not present

## 2022-08-16 DIAGNOSIS — I4819 Other persistent atrial fibrillation: Secondary | ICD-10-CM | POA: Diagnosis not present

## 2022-08-16 DIAGNOSIS — I2510 Atherosclerotic heart disease of native coronary artery without angina pectoris: Secondary | ICD-10-CM | POA: Diagnosis not present

## 2022-08-16 DIAGNOSIS — Z9011 Acquired absence of right breast and nipple: Secondary | ICD-10-CM | POA: Diagnosis not present

## 2022-08-16 DIAGNOSIS — Z853 Personal history of malignant neoplasm of breast: Secondary | ICD-10-CM | POA: Diagnosis not present

## 2022-08-16 DIAGNOSIS — Z87891 Personal history of nicotine dependence: Secondary | ICD-10-CM | POA: Diagnosis not present

## 2022-08-16 DIAGNOSIS — S72001D Fracture of unspecified part of neck of right femur, subsequent encounter for closed fracture with routine healing: Secondary | ICD-10-CM | POA: Diagnosis not present

## 2022-08-16 DIAGNOSIS — I10 Essential (primary) hypertension: Secondary | ICD-10-CM | POA: Diagnosis not present

## 2022-08-16 NOTE — Telephone Encounter (Signed)
Requested Prescriptions  Pending Prescriptions Disp Refills  . isosorbide mononitrate (IMDUR) 30 MG 24 hr tablet [Pharmacy Med Name: ISOSORBIDE MONONITRATE '30MG'$  ER TABS] 90 tablet 0    Sig: TAKE 1 TABLET(30 MG) BY MOUTH DAILY     Cardiovascular:  Nitrates Passed - 08/13/2022  3:23 PM      Passed - Last BP in normal range    BP Readings from Last 1 Encounters:  07/12/22 138/62         Passed - Last Heart Rate in normal range    Pulse Readings from Last 1 Encounters:  07/12/22 (!) 58         Passed - Valid encounter within last 12 months    Recent Outpatient Visits          1 month ago Confusion   [place] [facility] at [facility], Jesse Sans, MD   3 months ago Essential (primary) hypertension   [place] [facility] at [facility], Jesse Sans, MD   1 year ago Essential (primary) hypertension   [place] [facility] at [facility], Jesse Sans, MD   1 year ago Pseudogout of hand, left   [place] [facility] at [facility], Jesse Sans, MD   1 year ago Essential (primary) hypertension   [place] [facility] at [facility], Jesse Sans, MD      Future Appointments            In 2 months Army Melia, Jesse Sans, MD [place] and Sports Medicine at [facility], [facility]

## 2022-08-17 ENCOUNTER — Ambulatory Visit (INDEPENDENT_AMBULATORY_CARE_PROVIDER_SITE_OTHER): Payer: Medicare Other | Admitting: Internal Medicine

## 2022-08-17 ENCOUNTER — Encounter: Payer: Self-pay | Admitting: Internal Medicine

## 2022-08-17 VITALS — BP 110/68 | HR 67 | Ht 60.0 in | Wt 120.0 lb

## 2022-08-17 DIAGNOSIS — D6869 Other thrombophilia: Secondary | ICD-10-CM | POA: Diagnosis not present

## 2022-08-17 DIAGNOSIS — I2510 Atherosclerotic heart disease of native coronary artery without angina pectoris: Secondary | ICD-10-CM | POA: Diagnosis not present

## 2022-08-17 DIAGNOSIS — Z9011 Acquired absence of right breast and nipple: Secondary | ICD-10-CM | POA: Diagnosis not present

## 2022-08-17 DIAGNOSIS — I4819 Other persistent atrial fibrillation: Secondary | ICD-10-CM | POA: Diagnosis not present

## 2022-08-17 DIAGNOSIS — I779 Disorder of arteries and arterioles, unspecified: Secondary | ICD-10-CM | POA: Diagnosis not present

## 2022-08-17 DIAGNOSIS — L03116 Cellulitis of left lower limb: Secondary | ICD-10-CM | POA: Diagnosis not present

## 2022-08-17 DIAGNOSIS — Z87891 Personal history of nicotine dependence: Secondary | ICD-10-CM | POA: Diagnosis not present

## 2022-08-17 DIAGNOSIS — Z853 Personal history of malignant neoplasm of breast: Secondary | ICD-10-CM | POA: Diagnosis not present

## 2022-08-17 DIAGNOSIS — M11261 Other chondrocalcinosis, right knee: Secondary | ICD-10-CM | POA: Diagnosis not present

## 2022-08-17 DIAGNOSIS — Z791 Long term (current) use of non-steroidal anti-inflammatories (NSAID): Secondary | ICD-10-CM | POA: Diagnosis not present

## 2022-08-17 DIAGNOSIS — M810 Age-related osteoporosis without current pathological fracture: Secondary | ICD-10-CM | POA: Diagnosis not present

## 2022-08-17 DIAGNOSIS — Z951 Presence of aortocoronary bypass graft: Secondary | ICD-10-CM | POA: Diagnosis not present

## 2022-08-17 DIAGNOSIS — H401122 Primary open-angle glaucoma, left eye, moderate stage: Secondary | ICD-10-CM | POA: Diagnosis not present

## 2022-08-17 DIAGNOSIS — I10 Essential (primary) hypertension: Secondary | ICD-10-CM | POA: Diagnosis not present

## 2022-08-17 DIAGNOSIS — Z7901 Long term (current) use of anticoagulants: Secondary | ICD-10-CM | POA: Diagnosis not present

## 2022-08-17 DIAGNOSIS — M19019 Primary osteoarthritis, unspecified shoulder: Secondary | ICD-10-CM | POA: Diagnosis not present

## 2022-08-17 DIAGNOSIS — S72001D Fracture of unspecified part of neck of right femur, subsequent encounter for closed fracture with routine healing: Secondary | ICD-10-CM | POA: Diagnosis not present

## 2022-08-17 MED ORDER — CEFDINIR 300 MG PO CAPS: 300.0000 mg | ORAL_CAPSULE | Freq: Two times a day (BID) | ORAL | 0 refills | Status: AC

## 2022-08-17 NOTE — Progress Notes (Signed)
Date:  08/17/2022   Name:  Donna Santos   DOB:  28-Sep-1925   MRN:  [id number]   Chief Complaint: Cellulitis (Left leg redness, OT noticed this last Thursday. Slightly better today. )  Rash This is a new problem. The current episode started in the past 7 days. The affected locations include the left lower leg. The rash is characterized by redness (suspected early cellulits). Pertinent negatives include no fatigue, fever or shortness of breath.  Was warm and pink more than normal last Thursday.  Son thinks it may be slightly better.  It is tender, no known trauma but there is some bruising on the anterior ankle. She denies fever, chills, nausea, diarrhea.  Appetite is good.  Lab Results  Component Value Date   NA 136 06/17/2021   K 3.5 06/17/2021   CO2 27 06/17/2021   GLUCOSE 126 (H) 06/17/2021   BUN 16 06/17/2021   CREATININE 0.83 06/17/2021   CALCIUM 9.1 06/17/2021   GFRNONAA >60 06/17/2021   Lab Results  Component Value Date   CHOL 261 (A) 07/24/2013   HDL 44 07/24/2013   LDLCALC 179 07/24/2013   TRIG 192 (A) 07/24/2013   Lab Results  Component Value Date   TSH 3.300 05/02/2019   No results found for: "HGBA1C" Lab Results  Component Value Date   WBC 7.8 06/17/2021   HGB 13.0 06/17/2021   HCT 38.6 06/17/2021   MCV 94.1 06/17/2021   PLT 322 06/17/2021   Lab Results  Component Value Date   ALT 14 06/17/2021   AST 16 06/17/2021   ALKPHOS 38 06/17/2021   BILITOT 0.6 06/17/2021   Lab Results  Component Value Date   VD25OH 46.0 05/02/2019     Review of Systems  Constitutional:  Negative for chills, fatigue and fever.  Respiratory:  Negative for chest tightness and shortness of breath.   Cardiovascular:  Positive for palpitations. Negative for chest pain.  Gastrointestinal:  Negative for abdominal pain.  Skin:  Positive for color change and rash.  Neurological:  Negative for dizziness and headaches.  Psychiatric/Behavioral:  Negative for dysphoric mood  and sleep disturbance. The patient is not nervous/anxious.     Patient Active Problem List   Diagnosis Date Noted   Closed right hip fracture, with routine healing, subsequent encounter 07/12/2022   Acquired thrombophilia ([place]) 09/25/2020   Pseudogout of right knee 08/04/2020   Aortic atherosclerosis ([place]) 07/10/2020   Hyponatremia 06/16/2020   Hypokalemia 12/17/2019   Chest wall recurrence of right breast cancer ([place]) 11/16/2019   Osteoporosis 06/17/2019   Primary open angle glaucoma (POAG) of left eye, moderate stage 04/25/2018   Persistent atrial fibrillation ([place]) 12/28/2016   Carcinoma of overlapping sites of right breast in female, estrogen receptor positive ([place]) 12/21/2016   Senile dementia, uncomplicated ([place]) 27/74/1287   Low back pain 04/12/2016   Impaired renal function 05/09/2015   Arteriosclerosis of coronary artery 05/09/2015   Essential (primary) hypertension 05/09/2015   Personal history of malignant neoplasm of breast 05/09/2015   Muscle spasms of neck 05/09/2015   Arthritis of shoulder region, degenerative 05/09/2015   Hypercholesteremia 04/20/2012   Carotid artery disease ([place]) 05/28/2005    Allergies  Allergen Reactions   Ace Inhibitors     Other reaction(s): Angioedema   Brinzolamide-Brimonidine     Past Surgical History:  Procedure Laterality Date   ARTHROTOMY KNEE-INFEC W/EXPLOR/DRAIN/REMOV FB Right 02/2022   CAROTID ENDARTERECTOMY     CORONARY ARTERY BYPASS GRAFT  2006  MASS EXCISION Right 08/28/2015   Procedure: EXCISION MASS; remove massive right chest wall;  Surgeon: Florene Glen, MD;  Location: [facility] ORS;  Service: General;  Laterality: Right;   MASTECTOMY, RADICAL Right 2002   BREAST CA   TOTAL ABDOMINAL HYSTERECTOMY      Social History   Tobacco Use   Smoking status: Former    Packs/day: 1.50    Years: 1.50    Total pack years: 2.25    Types: Cigarettes    Quit date: 10/04/1952    Years since quitting: 69.9   Smokeless  tobacco: Never  Vaping Use   Vaping Use: Never used  Substance Use Topics   Alcohol use: No    Alcohol/week: 0.0 standard drinks of alcohol   Drug use: No     Medication list has been reviewed and updated.  Current Meds  Medication Sig   amLODipine (NORVASC) 5 MG tablet TAKE 1 TABLET(5 MG) BY MOUTH TWICE DAILY   apixaban (ELIQUIS) 2.5 MG TABS tablet TAKE 1 TABLET(2.5 MG) BY MOUTH TWICE DAILY   aspirin EC 81 MG tablet Take 81 mg by mouth daily.   Calcium Carb-Cholecalciferol 600-800 MG-UNIT TABS Take 1 tablet by mouth daily. Reported on 12/25/2015   Cholecalciferol (VITAMIN D-3) 125 MCG (5000 UT) TABS Take 1 tablet by mouth daily.   colchicine 0.6 MG tablet TAKE 1 TABLET(0.6 MG) BY MOUTH DAILY AS NEEDED FOR KNEE PAIN   isosorbide mononitrate (IMDUR) 30 MG 24 hr tablet TAKE 1 TABLET(30 MG) BY MOUTH DAILY   latanoprost (XALATAN) 0.005 % ophthalmic solution INT 1 GTT INTO EACH EYE QHS.   metoprolol succinate (TOPROL-XL) 50 MG 24 hr tablet TAKE 1 TABLET BY MOUTH EVERY NIGHT   nitroGLYCERIN (NITROSTAT) 0.4 MG SL tablet Place 0.4 mg under the tongue every 5 (five) minutes as needed for chest pain.   timolol (TIMOPTIC) 0.25 % ophthalmic solution INSTILL ONE DROP INTO BOTH EYES EVERY MORNING.   TURMERIC PO Take 1 tablet by mouth daily.       08/17/2022    2:11 PM 07/12/2022    4:32 PM 04/30/2022    1:32 PM 06/29/2021    1:41 PM  GAD 7 : Generalized Anxiety Score  Nervous, Anxious, on Edge 0 1 0 0  Control/stop worrying 0 1 0 1  Worry too much - different things 0 0 0 0  Trouble relaxing 0 0 0 0  Restless 0 0 0 0  Easily annoyed or irritable 0 0 0 0  Afraid - awful might happen 0 0 0 0  Total GAD 7 Score 0 2 0 1  Anxiety Difficulty Not difficult at all Not difficult at all Not difficult at all        08/17/2022    2:11 PM 07/12/2022    4:32 PM 04/30/2022    1:31 PM  Depression screen PHQ 2/9  Decreased Interest 0 0 1  Down, Depressed, Hopeless 0 0 0  PHQ - 2 Score 0 0 1  Altered  sleeping 1 1 0  Tired, decreased energy '2 2 1  '$ Change in appetite 0 0 1  Feeling bad or failure about yourself  0 0 0  Trouble concentrating '1 1 1  '$ Moving slowly or fidgety/restless 1 1 0  Suicidal thoughts 0 0 0  PHQ-9 Score '5 5 4  '$ Difficult doing work/chores Somewhat difficult Somewhat difficult Not difficult at all    BP Readings from Last 3 Encounters:  08/17/22 110/68  07/12/22 138/62  04/30/22 (!) 118/54    Physical Exam Vitals and nursing note reviewed.  Constitutional:      General: She is not in acute distress.    Appearance: Normal appearance. She is well-developed.  HENT:     Head: Normocephalic and atraumatic.  Cardiovascular:     Rate and Rhythm: Normal rate. Rhythm irregular.  Pulmonary:     Effort: Pulmonary effort is normal. No respiratory distress.     Breath sounds: No wheezing or rhonchi.  Skin:    General: Skin is warm and dry.     Findings: Bruising and erythema present. No rash.  Neurological:     General: No focal deficit present.     Mental Status: She is alert and oriented to person, place, and time.  Psychiatric:        Mood and Affect: Mood normal.        Behavior: Behavior normal.     Wt Readings from Last 3 Encounters:  08/17/22 120 lb (54.4 kg)  07/12/22 117 lb (53.1 kg)  04/30/22 123 lb (55.8 kg)    BP 110/68   Pulse 67   Ht 5' (1.524 m)   Wt 120 lb (54.4 kg)   SpO2 95%   BMI 23.44 kg/m   Assessment and Plan: 1. Cellulitis of left lower extremity Recommend elevation when possible. Take antibiotics - call if side effects - cefdinir (OMNICEF) 300 MG capsule; Take 1 capsule (300 mg total) by mouth 2 (two) times daily for 10 days.  Dispense: 20 capsule; Refill: 0  2. Essential (primary) hypertension Clinically stable exam with well controlled BP. Tolerating medications without side effects at this time. Pt to continue current regimen and low sodium diet; benefits of regular exercise as able discussed.  3. Persistent atrial  fibrillation (HCC) Rate controlled  On Eliquis.   Partially dictated using Editor, commissioning. Any errors are unintentional.  Halina Maidens, MD [facility]  08/17/2022

## 2022-08-18 DIAGNOSIS — M11261 Other chondrocalcinosis, right knee: Secondary | ICD-10-CM | POA: Diagnosis not present

## 2022-08-18 DIAGNOSIS — S72001D Fracture of unspecified part of neck of right femur, subsequent encounter for closed fracture with routine healing: Secondary | ICD-10-CM | POA: Diagnosis not present

## 2022-08-18 DIAGNOSIS — Z9011 Acquired absence of right breast and nipple: Secondary | ICD-10-CM | POA: Diagnosis not present

## 2022-08-18 DIAGNOSIS — M810 Age-related osteoporosis without current pathological fracture: Secondary | ICD-10-CM | POA: Diagnosis not present

## 2022-08-18 DIAGNOSIS — Z951 Presence of aortocoronary bypass graft: Secondary | ICD-10-CM | POA: Diagnosis not present

## 2022-08-18 DIAGNOSIS — Z87891 Personal history of nicotine dependence: Secondary | ICD-10-CM | POA: Diagnosis not present

## 2022-08-18 DIAGNOSIS — I10 Essential (primary) hypertension: Secondary | ICD-10-CM | POA: Diagnosis not present

## 2022-08-18 DIAGNOSIS — Z853 Personal history of malignant neoplasm of breast: Secondary | ICD-10-CM | POA: Diagnosis not present

## 2022-08-18 DIAGNOSIS — H401122 Primary open-angle glaucoma, left eye, moderate stage: Secondary | ICD-10-CM | POA: Diagnosis not present

## 2022-08-18 DIAGNOSIS — D6869 Other thrombophilia: Secondary | ICD-10-CM | POA: Diagnosis not present

## 2022-08-18 DIAGNOSIS — I779 Disorder of arteries and arterioles, unspecified: Secondary | ICD-10-CM | POA: Diagnosis not present

## 2022-08-18 DIAGNOSIS — I2510 Atherosclerotic heart disease of native coronary artery without angina pectoris: Secondary | ICD-10-CM | POA: Diagnosis not present

## 2022-08-18 DIAGNOSIS — Z7901 Long term (current) use of anticoagulants: Secondary | ICD-10-CM | POA: Diagnosis not present

## 2022-08-18 DIAGNOSIS — I4819 Other persistent atrial fibrillation: Secondary | ICD-10-CM | POA: Diagnosis not present

## 2022-08-18 DIAGNOSIS — M19019 Primary osteoarthritis, unspecified shoulder: Secondary | ICD-10-CM | POA: Diagnosis not present

## 2022-08-18 DIAGNOSIS — Z791 Long term (current) use of non-steroidal anti-inflammatories (NSAID): Secondary | ICD-10-CM | POA: Diagnosis not present

## 2022-08-19 DIAGNOSIS — Z791 Long term (current) use of non-steroidal anti-inflammatories (NSAID): Secondary | ICD-10-CM | POA: Diagnosis not present

## 2022-08-19 DIAGNOSIS — D6869 Other thrombophilia: Secondary | ICD-10-CM | POA: Diagnosis not present

## 2022-08-19 DIAGNOSIS — Z9011 Acquired absence of right breast and nipple: Secondary | ICD-10-CM | POA: Diagnosis not present

## 2022-08-19 DIAGNOSIS — Z7901 Long term (current) use of anticoagulants: Secondary | ICD-10-CM | POA: Diagnosis not present

## 2022-08-19 DIAGNOSIS — I779 Disorder of arteries and arterioles, unspecified: Secondary | ICD-10-CM | POA: Diagnosis not present

## 2022-08-19 DIAGNOSIS — I4819 Other persistent atrial fibrillation: Secondary | ICD-10-CM | POA: Diagnosis not present

## 2022-08-19 DIAGNOSIS — I2510 Atherosclerotic heart disease of native coronary artery without angina pectoris: Secondary | ICD-10-CM | POA: Diagnosis not present

## 2022-08-19 DIAGNOSIS — Z951 Presence of aortocoronary bypass graft: Secondary | ICD-10-CM | POA: Diagnosis not present

## 2022-08-19 DIAGNOSIS — S72001D Fracture of unspecified part of neck of right femur, subsequent encounter for closed fracture with routine healing: Secondary | ICD-10-CM | POA: Diagnosis not present

## 2022-08-19 DIAGNOSIS — M810 Age-related osteoporosis without current pathological fracture: Secondary | ICD-10-CM | POA: Diagnosis not present

## 2022-08-19 DIAGNOSIS — Z853 Personal history of malignant neoplasm of breast: Secondary | ICD-10-CM | POA: Diagnosis not present

## 2022-08-19 DIAGNOSIS — Z87891 Personal history of nicotine dependence: Secondary | ICD-10-CM | POA: Diagnosis not present

## 2022-08-19 DIAGNOSIS — H401122 Primary open-angle glaucoma, left eye, moderate stage: Secondary | ICD-10-CM | POA: Diagnosis not present

## 2022-08-19 DIAGNOSIS — M19019 Primary osteoarthritis, unspecified shoulder: Secondary | ICD-10-CM | POA: Diagnosis not present

## 2022-08-19 DIAGNOSIS — I10 Essential (primary) hypertension: Secondary | ICD-10-CM | POA: Diagnosis not present

## 2022-08-19 DIAGNOSIS — M11261 Other chondrocalcinosis, right knee: Secondary | ICD-10-CM | POA: Diagnosis not present

## 2022-08-20 DIAGNOSIS — M19019 Primary osteoarthritis, unspecified shoulder: Secondary | ICD-10-CM | POA: Diagnosis not present

## 2022-08-20 DIAGNOSIS — H401122 Primary open-angle glaucoma, left eye, moderate stage: Secondary | ICD-10-CM | POA: Diagnosis not present

## 2022-08-20 DIAGNOSIS — I4819 Other persistent atrial fibrillation: Secondary | ICD-10-CM | POA: Diagnosis not present

## 2022-08-20 DIAGNOSIS — M11261 Other chondrocalcinosis, right knee: Secondary | ICD-10-CM | POA: Diagnosis not present

## 2022-08-20 DIAGNOSIS — Z853 Personal history of malignant neoplasm of breast: Secondary | ICD-10-CM | POA: Diagnosis not present

## 2022-08-20 DIAGNOSIS — Z951 Presence of aortocoronary bypass graft: Secondary | ICD-10-CM | POA: Diagnosis not present

## 2022-08-20 DIAGNOSIS — Z87891 Personal history of nicotine dependence: Secondary | ICD-10-CM | POA: Diagnosis not present

## 2022-08-20 DIAGNOSIS — Z791 Long term (current) use of non-steroidal anti-inflammatories (NSAID): Secondary | ICD-10-CM | POA: Diagnosis not present

## 2022-08-20 DIAGNOSIS — M810 Age-related osteoporosis without current pathological fracture: Secondary | ICD-10-CM | POA: Diagnosis not present

## 2022-08-20 DIAGNOSIS — S72001D Fracture of unspecified part of neck of right femur, subsequent encounter for closed fracture with routine healing: Secondary | ICD-10-CM | POA: Diagnosis not present

## 2022-08-20 DIAGNOSIS — I2510 Atherosclerotic heart disease of native coronary artery without angina pectoris: Secondary | ICD-10-CM | POA: Diagnosis not present

## 2022-08-20 DIAGNOSIS — Z9011 Acquired absence of right breast and nipple: Secondary | ICD-10-CM | POA: Diagnosis not present

## 2022-08-20 DIAGNOSIS — Z7901 Long term (current) use of anticoagulants: Secondary | ICD-10-CM | POA: Diagnosis not present

## 2022-08-20 DIAGNOSIS — D6869 Other thrombophilia: Secondary | ICD-10-CM | POA: Diagnosis not present

## 2022-08-20 DIAGNOSIS — I779 Disorder of arteries and arterioles, unspecified: Secondary | ICD-10-CM | POA: Diagnosis not present

## 2022-08-20 DIAGNOSIS — I10 Essential (primary) hypertension: Secondary | ICD-10-CM | POA: Diagnosis not present

## 2022-08-23 DIAGNOSIS — H401122 Primary open-angle glaucoma, left eye, moderate stage: Secondary | ICD-10-CM | POA: Diagnosis not present

## 2022-08-23 DIAGNOSIS — Z791 Long term (current) use of non-steroidal anti-inflammatories (NSAID): Secondary | ICD-10-CM | POA: Diagnosis not present

## 2022-08-23 DIAGNOSIS — Z951 Presence of aortocoronary bypass graft: Secondary | ICD-10-CM | POA: Diagnosis not present

## 2022-08-23 DIAGNOSIS — I4819 Other persistent atrial fibrillation: Secondary | ICD-10-CM | POA: Diagnosis not present

## 2022-08-23 DIAGNOSIS — M19019 Primary osteoarthritis, unspecified shoulder: Secondary | ICD-10-CM | POA: Diagnosis not present

## 2022-08-23 DIAGNOSIS — Z87891 Personal history of nicotine dependence: Secondary | ICD-10-CM | POA: Diagnosis not present

## 2022-08-23 DIAGNOSIS — I10 Essential (primary) hypertension: Secondary | ICD-10-CM | POA: Diagnosis not present

## 2022-08-23 DIAGNOSIS — M810 Age-related osteoporosis without current pathological fracture: Secondary | ICD-10-CM | POA: Diagnosis not present

## 2022-08-23 DIAGNOSIS — M11261 Other chondrocalcinosis, right knee: Secondary | ICD-10-CM | POA: Diagnosis not present

## 2022-08-23 DIAGNOSIS — I2510 Atherosclerotic heart disease of native coronary artery without angina pectoris: Secondary | ICD-10-CM | POA: Diagnosis not present

## 2022-08-23 DIAGNOSIS — Z9011 Acquired absence of right breast and nipple: Secondary | ICD-10-CM | POA: Diagnosis not present

## 2022-08-23 DIAGNOSIS — Z853 Personal history of malignant neoplasm of breast: Secondary | ICD-10-CM | POA: Diagnosis not present

## 2022-08-23 DIAGNOSIS — I779 Disorder of arteries and arterioles, unspecified: Secondary | ICD-10-CM | POA: Diagnosis not present

## 2022-08-23 DIAGNOSIS — D6869 Other thrombophilia: Secondary | ICD-10-CM | POA: Diagnosis not present

## 2022-08-23 DIAGNOSIS — Z7901 Long term (current) use of anticoagulants: Secondary | ICD-10-CM | POA: Diagnosis not present

## 2022-08-23 DIAGNOSIS — S72001D Fracture of unspecified part of neck of right femur, subsequent encounter for closed fracture with routine healing: Secondary | ICD-10-CM | POA: Diagnosis not present

## 2022-08-24 DIAGNOSIS — Z87891 Personal history of nicotine dependence: Secondary | ICD-10-CM | POA: Diagnosis not present

## 2022-08-24 DIAGNOSIS — Z7901 Long term (current) use of anticoagulants: Secondary | ICD-10-CM | POA: Diagnosis not present

## 2022-08-24 DIAGNOSIS — Z853 Personal history of malignant neoplasm of breast: Secondary | ICD-10-CM | POA: Diagnosis not present

## 2022-08-24 DIAGNOSIS — Z9011 Acquired absence of right breast and nipple: Secondary | ICD-10-CM | POA: Diagnosis not present

## 2022-08-24 DIAGNOSIS — I4819 Other persistent atrial fibrillation: Secondary | ICD-10-CM | POA: Diagnosis not present

## 2022-08-24 DIAGNOSIS — H401122 Primary open-angle glaucoma, left eye, moderate stage: Secondary | ICD-10-CM | POA: Diagnosis not present

## 2022-08-24 DIAGNOSIS — Z791 Long term (current) use of non-steroidal anti-inflammatories (NSAID): Secondary | ICD-10-CM | POA: Diagnosis not present

## 2022-08-24 DIAGNOSIS — S72001D Fracture of unspecified part of neck of right femur, subsequent encounter for closed fracture with routine healing: Secondary | ICD-10-CM | POA: Diagnosis not present

## 2022-08-24 DIAGNOSIS — I2510 Atherosclerotic heart disease of native coronary artery without angina pectoris: Secondary | ICD-10-CM | POA: Diagnosis not present

## 2022-08-24 DIAGNOSIS — M11261 Other chondrocalcinosis, right knee: Secondary | ICD-10-CM | POA: Diagnosis not present

## 2022-08-24 DIAGNOSIS — M19019 Primary osteoarthritis, unspecified shoulder: Secondary | ICD-10-CM | POA: Diagnosis not present

## 2022-08-24 DIAGNOSIS — D6869 Other thrombophilia: Secondary | ICD-10-CM | POA: Diagnosis not present

## 2022-08-24 DIAGNOSIS — I779 Disorder of arteries and arterioles, unspecified: Secondary | ICD-10-CM | POA: Diagnosis not present

## 2022-08-24 DIAGNOSIS — I10 Essential (primary) hypertension: Secondary | ICD-10-CM | POA: Diagnosis not present

## 2022-08-24 DIAGNOSIS — Z951 Presence of aortocoronary bypass graft: Secondary | ICD-10-CM | POA: Diagnosis not present

## 2022-08-24 DIAGNOSIS — M810 Age-related osteoporosis without current pathological fracture: Secondary | ICD-10-CM | POA: Diagnosis not present

## 2022-08-25 DIAGNOSIS — M19019 Primary osteoarthritis, unspecified shoulder: Secondary | ICD-10-CM | POA: Diagnosis not present

## 2022-08-25 DIAGNOSIS — I779 Disorder of arteries and arterioles, unspecified: Secondary | ICD-10-CM | POA: Diagnosis not present

## 2022-08-25 DIAGNOSIS — M810 Age-related osteoporosis without current pathological fracture: Secondary | ICD-10-CM | POA: Diagnosis not present

## 2022-08-25 DIAGNOSIS — M11261 Other chondrocalcinosis, right knee: Secondary | ICD-10-CM | POA: Diagnosis not present

## 2022-08-25 DIAGNOSIS — I10 Essential (primary) hypertension: Secondary | ICD-10-CM | POA: Diagnosis not present

## 2022-08-25 DIAGNOSIS — Z791 Long term (current) use of non-steroidal anti-inflammatories (NSAID): Secondary | ICD-10-CM | POA: Diagnosis not present

## 2022-08-25 DIAGNOSIS — Z9011 Acquired absence of right breast and nipple: Secondary | ICD-10-CM | POA: Diagnosis not present

## 2022-08-25 DIAGNOSIS — I4819 Other persistent atrial fibrillation: Secondary | ICD-10-CM | POA: Diagnosis not present

## 2022-08-25 DIAGNOSIS — D6869 Other thrombophilia: Secondary | ICD-10-CM | POA: Diagnosis not present

## 2022-08-25 DIAGNOSIS — Z951 Presence of aortocoronary bypass graft: Secondary | ICD-10-CM | POA: Diagnosis not present

## 2022-08-25 DIAGNOSIS — Z853 Personal history of malignant neoplasm of breast: Secondary | ICD-10-CM | POA: Diagnosis not present

## 2022-08-25 DIAGNOSIS — H401122 Primary open-angle glaucoma, left eye, moderate stage: Secondary | ICD-10-CM | POA: Diagnosis not present

## 2022-08-25 DIAGNOSIS — Z7901 Long term (current) use of anticoagulants: Secondary | ICD-10-CM | POA: Diagnosis not present

## 2022-08-25 DIAGNOSIS — I2510 Atherosclerotic heart disease of native coronary artery without angina pectoris: Secondary | ICD-10-CM | POA: Diagnosis not present

## 2022-08-25 DIAGNOSIS — Z87891 Personal history of nicotine dependence: Secondary | ICD-10-CM | POA: Diagnosis not present

## 2022-08-25 DIAGNOSIS — S72001D Fracture of unspecified part of neck of right femur, subsequent encounter for closed fracture with routine healing: Secondary | ICD-10-CM | POA: Diagnosis not present

## 2022-08-27 DIAGNOSIS — Z7901 Long term (current) use of anticoagulants: Secondary | ICD-10-CM | POA: Diagnosis not present

## 2022-08-27 DIAGNOSIS — I779 Disorder of arteries and arterioles, unspecified: Secondary | ICD-10-CM | POA: Diagnosis not present

## 2022-08-27 DIAGNOSIS — H401122 Primary open-angle glaucoma, left eye, moderate stage: Secondary | ICD-10-CM | POA: Diagnosis not present

## 2022-08-27 DIAGNOSIS — D6869 Other thrombophilia: Secondary | ICD-10-CM | POA: Diagnosis not present

## 2022-08-27 DIAGNOSIS — I4819 Other persistent atrial fibrillation: Secondary | ICD-10-CM | POA: Diagnosis not present

## 2022-08-27 DIAGNOSIS — Z9011 Acquired absence of right breast and nipple: Secondary | ICD-10-CM | POA: Diagnosis not present

## 2022-08-27 DIAGNOSIS — S72001D Fracture of unspecified part of neck of right femur, subsequent encounter for closed fracture with routine healing: Secondary | ICD-10-CM | POA: Diagnosis not present

## 2022-08-27 DIAGNOSIS — Z853 Personal history of malignant neoplasm of breast: Secondary | ICD-10-CM | POA: Diagnosis not present

## 2022-08-27 DIAGNOSIS — M11261 Other chondrocalcinosis, right knee: Secondary | ICD-10-CM | POA: Diagnosis not present

## 2022-08-27 DIAGNOSIS — I2510 Atherosclerotic heart disease of native coronary artery without angina pectoris: Secondary | ICD-10-CM | POA: Diagnosis not present

## 2022-08-27 DIAGNOSIS — I10 Essential (primary) hypertension: Secondary | ICD-10-CM | POA: Diagnosis not present

## 2022-08-27 DIAGNOSIS — Z951 Presence of aortocoronary bypass graft: Secondary | ICD-10-CM | POA: Diagnosis not present

## 2022-08-27 DIAGNOSIS — M19019 Primary osteoarthritis, unspecified shoulder: Secondary | ICD-10-CM | POA: Diagnosis not present

## 2022-08-27 DIAGNOSIS — Z87891 Personal history of nicotine dependence: Secondary | ICD-10-CM | POA: Diagnosis not present

## 2022-08-27 DIAGNOSIS — M810 Age-related osteoporosis without current pathological fracture: Secondary | ICD-10-CM | POA: Diagnosis not present

## 2022-08-27 DIAGNOSIS — Z791 Long term (current) use of non-steroidal anti-inflammatories (NSAID): Secondary | ICD-10-CM | POA: Diagnosis not present

## 2022-08-28 DIAGNOSIS — Z9011 Acquired absence of right breast and nipple: Secondary | ICD-10-CM | POA: Diagnosis not present

## 2022-08-28 DIAGNOSIS — Z791 Long term (current) use of non-steroidal anti-inflammatories (NSAID): Secondary | ICD-10-CM | POA: Diagnosis not present

## 2022-08-28 DIAGNOSIS — Z7901 Long term (current) use of anticoagulants: Secondary | ICD-10-CM | POA: Diagnosis not present

## 2022-08-28 DIAGNOSIS — M19019 Primary osteoarthritis, unspecified shoulder: Secondary | ICD-10-CM | POA: Diagnosis not present

## 2022-08-28 DIAGNOSIS — I10 Essential (primary) hypertension: Secondary | ICD-10-CM | POA: Diagnosis not present

## 2022-08-28 DIAGNOSIS — Z87891 Personal history of nicotine dependence: Secondary | ICD-10-CM | POA: Diagnosis not present

## 2022-08-28 DIAGNOSIS — I4819 Other persistent atrial fibrillation: Secondary | ICD-10-CM | POA: Diagnosis not present

## 2022-08-28 DIAGNOSIS — D6869 Other thrombophilia: Secondary | ICD-10-CM | POA: Diagnosis not present

## 2022-08-28 DIAGNOSIS — H401122 Primary open-angle glaucoma, left eye, moderate stage: Secondary | ICD-10-CM | POA: Diagnosis not present

## 2022-08-28 DIAGNOSIS — Z951 Presence of aortocoronary bypass graft: Secondary | ICD-10-CM | POA: Diagnosis not present

## 2022-08-28 DIAGNOSIS — M810 Age-related osteoporosis without current pathological fracture: Secondary | ICD-10-CM | POA: Diagnosis not present

## 2022-08-28 DIAGNOSIS — Z853 Personal history of malignant neoplasm of breast: Secondary | ICD-10-CM | POA: Diagnosis not present

## 2022-08-28 DIAGNOSIS — I2510 Atherosclerotic heart disease of native coronary artery without angina pectoris: Secondary | ICD-10-CM | POA: Diagnosis not present

## 2022-08-28 DIAGNOSIS — I779 Disorder of arteries and arterioles, unspecified: Secondary | ICD-10-CM | POA: Diagnosis not present

## 2022-08-28 DIAGNOSIS — M11261 Other chondrocalcinosis, right knee: Secondary | ICD-10-CM | POA: Diagnosis not present

## 2022-08-28 DIAGNOSIS — S72001D Fracture of unspecified part of neck of right femur, subsequent encounter for closed fracture with routine healing: Secondary | ICD-10-CM | POA: Diagnosis not present

## 2022-08-30 DIAGNOSIS — M11261 Other chondrocalcinosis, right knee: Secondary | ICD-10-CM | POA: Diagnosis not present

## 2022-08-30 DIAGNOSIS — Z951 Presence of aortocoronary bypass graft: Secondary | ICD-10-CM | POA: Diagnosis not present

## 2022-08-30 DIAGNOSIS — Z87891 Personal history of nicotine dependence: Secondary | ICD-10-CM | POA: Diagnosis not present

## 2022-08-30 DIAGNOSIS — I2510 Atherosclerotic heart disease of native coronary artery without angina pectoris: Secondary | ICD-10-CM | POA: Diagnosis not present

## 2022-08-30 DIAGNOSIS — Z9011 Acquired absence of right breast and nipple: Secondary | ICD-10-CM | POA: Diagnosis not present

## 2022-08-30 DIAGNOSIS — D6869 Other thrombophilia: Secondary | ICD-10-CM | POA: Diagnosis not present

## 2022-08-30 DIAGNOSIS — M19019 Primary osteoarthritis, unspecified shoulder: Secondary | ICD-10-CM | POA: Diagnosis not present

## 2022-08-30 DIAGNOSIS — Z853 Personal history of malignant neoplasm of breast: Secondary | ICD-10-CM | POA: Diagnosis not present

## 2022-08-30 DIAGNOSIS — I4819 Other persistent atrial fibrillation: Secondary | ICD-10-CM | POA: Diagnosis not present

## 2022-08-30 DIAGNOSIS — I779 Disorder of arteries and arterioles, unspecified: Secondary | ICD-10-CM | POA: Diagnosis not present

## 2022-08-30 DIAGNOSIS — I10 Essential (primary) hypertension: Secondary | ICD-10-CM | POA: Diagnosis not present

## 2022-08-30 DIAGNOSIS — H401122 Primary open-angle glaucoma, left eye, moderate stage: Secondary | ICD-10-CM | POA: Diagnosis not present

## 2022-08-30 DIAGNOSIS — M810 Age-related osteoporosis without current pathological fracture: Secondary | ICD-10-CM | POA: Diagnosis not present

## 2022-08-30 DIAGNOSIS — Z7901 Long term (current) use of anticoagulants: Secondary | ICD-10-CM | POA: Diagnosis not present

## 2022-08-30 DIAGNOSIS — S72001D Fracture of unspecified part of neck of right femur, subsequent encounter for closed fracture with routine healing: Secondary | ICD-10-CM | POA: Diagnosis not present

## 2022-08-30 DIAGNOSIS — Z791 Long term (current) use of non-steroidal anti-inflammatories (NSAID): Secondary | ICD-10-CM | POA: Diagnosis not present

## 2022-08-31 DIAGNOSIS — Z951 Presence of aortocoronary bypass graft: Secondary | ICD-10-CM | POA: Diagnosis not present

## 2022-08-31 DIAGNOSIS — H401122 Primary open-angle glaucoma, left eye, moderate stage: Secondary | ICD-10-CM | POA: Diagnosis not present

## 2022-08-31 DIAGNOSIS — S72001D Fracture of unspecified part of neck of right femur, subsequent encounter for closed fracture with routine healing: Secondary | ICD-10-CM | POA: Diagnosis not present

## 2022-08-31 DIAGNOSIS — Z791 Long term (current) use of non-steroidal anti-inflammatories (NSAID): Secondary | ICD-10-CM | POA: Diagnosis not present

## 2022-08-31 DIAGNOSIS — Z853 Personal history of malignant neoplasm of breast: Secondary | ICD-10-CM | POA: Diagnosis not present

## 2022-08-31 DIAGNOSIS — I4819 Other persistent atrial fibrillation: Secondary | ICD-10-CM | POA: Diagnosis not present

## 2022-08-31 DIAGNOSIS — Z87891 Personal history of nicotine dependence: Secondary | ICD-10-CM | POA: Diagnosis not present

## 2022-08-31 DIAGNOSIS — Z9011 Acquired absence of right breast and nipple: Secondary | ICD-10-CM | POA: Diagnosis not present

## 2022-08-31 DIAGNOSIS — I779 Disorder of arteries and arterioles, unspecified: Secondary | ICD-10-CM | POA: Diagnosis not present

## 2022-08-31 DIAGNOSIS — I10 Essential (primary) hypertension: Secondary | ICD-10-CM | POA: Diagnosis not present

## 2022-08-31 DIAGNOSIS — I2510 Atherosclerotic heart disease of native coronary artery without angina pectoris: Secondary | ICD-10-CM | POA: Diagnosis not present

## 2022-08-31 DIAGNOSIS — M19019 Primary osteoarthritis, unspecified shoulder: Secondary | ICD-10-CM | POA: Diagnosis not present

## 2022-08-31 DIAGNOSIS — D6869 Other thrombophilia: Secondary | ICD-10-CM | POA: Diagnosis not present

## 2022-08-31 DIAGNOSIS — M810 Age-related osteoporosis without current pathological fracture: Secondary | ICD-10-CM | POA: Diagnosis not present

## 2022-08-31 DIAGNOSIS — Z7901 Long term (current) use of anticoagulants: Secondary | ICD-10-CM | POA: Diagnosis not present

## 2022-08-31 DIAGNOSIS — M11261 Other chondrocalcinosis, right knee: Secondary | ICD-10-CM | POA: Diagnosis not present

## 2022-09-01 DIAGNOSIS — Z951 Presence of aortocoronary bypass graft: Secondary | ICD-10-CM | POA: Diagnosis not present

## 2022-09-01 DIAGNOSIS — Z9011 Acquired absence of right breast and nipple: Secondary | ICD-10-CM | POA: Diagnosis not present

## 2022-09-01 DIAGNOSIS — Z7901 Long term (current) use of anticoagulants: Secondary | ICD-10-CM | POA: Diagnosis not present

## 2022-09-01 DIAGNOSIS — M810 Age-related osteoporosis without current pathological fracture: Secondary | ICD-10-CM | POA: Diagnosis not present

## 2022-09-01 DIAGNOSIS — Z791 Long term (current) use of non-steroidal anti-inflammatories (NSAID): Secondary | ICD-10-CM | POA: Diagnosis not present

## 2022-09-01 DIAGNOSIS — H401122 Primary open-angle glaucoma, left eye, moderate stage: Secondary | ICD-10-CM | POA: Diagnosis not present

## 2022-09-01 DIAGNOSIS — M11261 Other chondrocalcinosis, right knee: Secondary | ICD-10-CM | POA: Diagnosis not present

## 2022-09-01 DIAGNOSIS — I779 Disorder of arteries and arterioles, unspecified: Secondary | ICD-10-CM | POA: Diagnosis not present

## 2022-09-01 DIAGNOSIS — I2510 Atherosclerotic heart disease of native coronary artery without angina pectoris: Secondary | ICD-10-CM | POA: Diagnosis not present

## 2022-09-01 DIAGNOSIS — S72001D Fracture of unspecified part of neck of right femur, subsequent encounter for closed fracture with routine healing: Secondary | ICD-10-CM | POA: Diagnosis not present

## 2022-09-01 DIAGNOSIS — D6869 Other thrombophilia: Secondary | ICD-10-CM | POA: Diagnosis not present

## 2022-09-01 DIAGNOSIS — I10 Essential (primary) hypertension: Secondary | ICD-10-CM | POA: Diagnosis not present

## 2022-09-01 DIAGNOSIS — I4819 Other persistent atrial fibrillation: Secondary | ICD-10-CM | POA: Diagnosis not present

## 2022-09-01 DIAGNOSIS — Z853 Personal history of malignant neoplasm of breast: Secondary | ICD-10-CM | POA: Diagnosis not present

## 2022-09-01 DIAGNOSIS — M19019 Primary osteoarthritis, unspecified shoulder: Secondary | ICD-10-CM | POA: Diagnosis not present

## 2022-09-01 DIAGNOSIS — Z87891 Personal history of nicotine dependence: Secondary | ICD-10-CM | POA: Diagnosis not present

## 2022-09-02 ENCOUNTER — Ambulatory Visit: Payer: Self-pay | Admitting: *Deleted

## 2022-09-02 DIAGNOSIS — I779 Disorder of arteries and arterioles, unspecified: Secondary | ICD-10-CM | POA: Diagnosis not present

## 2022-09-02 DIAGNOSIS — Z951 Presence of aortocoronary bypass graft: Secondary | ICD-10-CM | POA: Diagnosis not present

## 2022-09-02 DIAGNOSIS — I2510 Atherosclerotic heart disease of native coronary artery without angina pectoris: Secondary | ICD-10-CM | POA: Diagnosis not present

## 2022-09-02 DIAGNOSIS — M810 Age-related osteoporosis without current pathological fracture: Secondary | ICD-10-CM | POA: Diagnosis not present

## 2022-09-02 DIAGNOSIS — D6869 Other thrombophilia: Secondary | ICD-10-CM | POA: Diagnosis not present

## 2022-09-02 DIAGNOSIS — H401122 Primary open-angle glaucoma, left eye, moderate stage: Secondary | ICD-10-CM | POA: Diagnosis not present

## 2022-09-02 DIAGNOSIS — Z853 Personal history of malignant neoplasm of breast: Secondary | ICD-10-CM | POA: Diagnosis not present

## 2022-09-02 DIAGNOSIS — M19019 Primary osteoarthritis, unspecified shoulder: Secondary | ICD-10-CM | POA: Diagnosis not present

## 2022-09-02 DIAGNOSIS — Z7901 Long term (current) use of anticoagulants: Secondary | ICD-10-CM | POA: Diagnosis not present

## 2022-09-02 DIAGNOSIS — Z791 Long term (current) use of non-steroidal anti-inflammatories (NSAID): Secondary | ICD-10-CM | POA: Diagnosis not present

## 2022-09-02 DIAGNOSIS — I10 Essential (primary) hypertension: Secondary | ICD-10-CM | POA: Diagnosis not present

## 2022-09-02 DIAGNOSIS — Z9011 Acquired absence of right breast and nipple: Secondary | ICD-10-CM | POA: Diagnosis not present

## 2022-09-02 DIAGNOSIS — I4819 Other persistent atrial fibrillation: Secondary | ICD-10-CM | POA: Diagnosis not present

## 2022-09-02 DIAGNOSIS — M11261 Other chondrocalcinosis, right knee: Secondary | ICD-10-CM | POA: Diagnosis not present

## 2022-09-02 DIAGNOSIS — S72001D Fracture of unspecified part of neck of right femur, subsequent encounter for closed fracture with routine healing: Secondary | ICD-10-CM | POA: Diagnosis not present

## 2022-09-02 DIAGNOSIS — Z87891 Personal history of nicotine dependence: Secondary | ICD-10-CM | POA: Diagnosis not present

## 2022-09-02 NOTE — Telephone Encounter (Signed)
Pt's son Jenny Reichmann called back, offered appt for tomorrow with Dr. Army Melia. He states he will be out of town until late evening tomorrow and could do appt next week. He states he would call Silvio Clayman the other son and he if he can bring her in tomorrow at some point. Silvio Clayman is suppose to call back.

## 2022-09-02 NOTE — Telephone Encounter (Signed)
  Chief Complaint: redness left leg per report from [place]. Completed 10 day antibiotic prescribed Symptoms: left leg redness returned, warm to touch, below shin area. No swelling no pain reported patient hx dementia Frequency: noted today  Pertinent Negatives: Patient denies no fever no swelling  Disposition: '[]'$ ED /'[]'$ Urgent Care (no appt availability in office) / '[]'$ Appointment(In office/virtual)/ '[]'$  [facility]/ '[]'$ Home Care/ '[]'$ Refused Recommended Disposition /'[]'$ [facility]/ '[x]'$  Follow-up with PCP Additional Notes:   Completed 10 antibiotic prescription. Now redness returned and warm to touch. Please advise if appt needed for patient to come in to office. Will need to notify patient's son. Please advise.    Reason for Disposition  [1] Looks infected (spreading redness, pus) AND [2] no fever  Answer Assessment - Initial Assessment Questions 1. APPEARANCE of RASH: "Describe the rash."      Redness and warm to touch left leg per Jonelle Sidle , RN from [facility]  2. LOCATION: "Where is the rash located?"      Redness below shin 3. NUMBER: "How many spots are there?"      na 4. SIZE: "How big are the spots?" (Inches, centimeters or compare to size of a coin)      na 5. ONSET: "When did the rash start?"      Redness noted today  6. ITCHING: "Does the rash itch?" If Yes, ask: "How bad is the itch?"  (Scale 0-10; or none, mild, moderate, severe)     na 7. PAIN: "Does the rash hurt?" If Yes, ask: "How bad is the pain?"  (Scale 0-10; or none, mild, moderate, severe)    - NONE (0): no pain    - MILD (1-3): doesn't interfere with normal activities     - MODERATE (4-7): interferes with normal activities or awakens from sleep     - SEVERE (8-10): excruciating pain, unable to do any normal activities     Na . Patient has dementia and does not report pain  8. OTHER SYMPTOMS: "Do you have any other symptoms?" (e.g., fever)     Left leg redness , warm to touch below shin area  9.  PREGNANCY: "Is there any chance you are pregnant?" "When was your last menstrual period?"     na  Protocols used: Rash or Redness - Localized-A-AH

## 2022-09-03 ENCOUNTER — Ambulatory Visit (INDEPENDENT_AMBULATORY_CARE_PROVIDER_SITE_OTHER): Payer: Medicare Other | Admitting: Internal Medicine

## 2022-09-03 ENCOUNTER — Encounter: Payer: Self-pay | Admitting: Internal Medicine

## 2022-09-03 VITALS — BP 128/68 | HR 72 | Ht 60.0 in | Wt 119.0 lb

## 2022-09-03 DIAGNOSIS — L03116 Cellulitis of left lower limb: Secondary | ICD-10-CM | POA: Diagnosis not present

## 2022-09-03 DIAGNOSIS — R600 Localized edema: Secondary | ICD-10-CM | POA: Diagnosis not present

## 2022-09-03 NOTE — Telephone Encounter (Signed)
Patient scheduled for today.  - Donna Santos

## 2022-09-03 NOTE — Progress Notes (Signed)
Date:  09/03/2022   Name:  Donna Santos   DOB:  January 31, 1925   MRN:  [id number]   Chief Complaint: Cellulitis (Nurse came out to check her leg yesterday. Nurse thinks its getting worse. No pain.)  HPI She is now back home living on her own.  She sits up all day, rarely elevates her legs.  She gets meals on wheels which may have excess sodium. She has hx of cellulitis - just completed a course of antibiotics one week ago. Legs are not painful but they are are mildly swollen,  she denies fever, chills, etc. Both legs have a similar appearance.  Lab Results  Component Value Date   NA 136 06/17/2021   K 3.5 06/17/2021   CO2 27 06/17/2021   GLUCOSE 126 (H) 06/17/2021   BUN 16 06/17/2021   CREATININE 0.83 06/17/2021   CALCIUM 9.1 06/17/2021   GFRNONAA >60 06/17/2021   Lab Results  Component Value Date   CHOL 261 (A) 07/24/2013   HDL 44 07/24/2013   LDLCALC 179 07/24/2013   TRIG 192 (A) 07/24/2013   Lab Results  Component Value Date   TSH 3.300 05/02/2019   No results found for: "HGBA1C" Lab Results  Component Value Date   WBC 7.8 06/17/2021   HGB 13.0 06/17/2021   HCT 38.6 06/17/2021   MCV 94.1 06/17/2021   PLT 322 06/17/2021   Lab Results  Component Value Date   ALT 14 06/17/2021   AST 16 06/17/2021   ALKPHOS 38 06/17/2021   BILITOT 0.6 06/17/2021   Lab Results  Component Value Date   VD25OH 46.0 05/02/2019     Review of Systems  Constitutional:  Negative for chills, fatigue and fever.  Respiratory:  Negative for chest tightness.   Cardiovascular:  Positive for leg swelling. Negative for chest pain and palpitations.  Neurological:  Negative for dizziness and headaches.  Psychiatric/Behavioral:  Negative for dysphoric mood and sleep disturbance. The patient is not nervous/anxious.     Patient Active Problem List   Diagnosis Date Noted   Closed right hip fracture, with routine healing, subsequent encounter 07/12/2022   Acquired thrombophilia ([place])  09/25/2020   Pseudogout of right knee 08/04/2020   Aortic atherosclerosis ([place]) 07/10/2020   Hyponatremia 06/16/2020   Hypokalemia 12/17/2019   Chest wall recurrence of right breast cancer ([place]) 11/16/2019   Osteoporosis 06/17/2019   Primary open angle glaucoma (POAG) of left eye, moderate stage 04/25/2018   Persistent atrial fibrillation ([place]) 12/28/2016   Carcinoma of overlapping sites of right breast in female, estrogen receptor positive ([place]) 12/21/2016   Senile dementia, uncomplicated ([place]) 30/16/0109   Low back pain 04/12/2016   Impaired renal function 05/09/2015   Arteriosclerosis of coronary artery 05/09/2015   Essential (primary) hypertension 05/09/2015   Personal history of malignant neoplasm of breast 05/09/2015   Muscle spasms of neck 05/09/2015   Arthritis of shoulder region, degenerative 05/09/2015   Hypercholesteremia 04/20/2012   Carotid artery disease ([place]) 05/28/2005    Allergies  Allergen Reactions   Ace Inhibitors     Other reaction(s): Angioedema   Brinzolamide-Brimonidine     Past Surgical History:  Procedure Laterality Date   ARTHROTOMY KNEE-INFEC W/EXPLOR/DRAIN/REMOV FB Right 02/2022   CAROTID ENDARTERECTOMY     CORONARY ARTERY BYPASS GRAFT  2006   MASS EXCISION Right 08/28/2015   Procedure: EXCISION MASS; remove massive right chest wall;  Surgeon: Florene Glen, MD;  Location: [facility] ORS;  Service: General;  Laterality: Right;  MASTECTOMY, RADICAL Right 2002   BREAST CA   TOTAL ABDOMINAL HYSTERECTOMY      Social History   Tobacco Use   Smoking status: Former    Packs/day: 1.50    Years: 1.50    Total pack years: 2.25    Types: Cigarettes    Quit date: 10/04/1952    Years since quitting: 69.9   Smokeless tobacco: Never  Vaping Use   Vaping Use: Never used  Substance Use Topics   Alcohol use: No    Alcohol/week: 0.0 standard drinks of alcohol   Drug use: No     Medication list has been reviewed and updated.  Current Meds   Medication Sig   amLODipine (NORVASC) 5 MG tablet TAKE 1 TABLET(5 MG) BY MOUTH TWICE DAILY   apixaban (ELIQUIS) 2.5 MG TABS tablet TAKE 1 TABLET(2.5 MG) BY MOUTH TWICE DAILY   aspirin EC 81 MG tablet Take 81 mg by mouth daily.   Calcium Carb-Cholecalciferol 600-800 MG-UNIT TABS Take 1 tablet by mouth daily. Reported on 12/25/2015   Cholecalciferol (VITAMIN D-3) 125 MCG (5000 UT) TABS Take 1 tablet by mouth daily.   colchicine 0.6 MG tablet TAKE 1 TABLET(0.6 MG) BY MOUTH DAILY AS NEEDED FOR KNEE PAIN   isosorbide mononitrate (IMDUR) 30 MG 24 hr tablet TAKE 1 TABLET(30 MG) BY MOUTH DAILY   latanoprost (XALATAN) 0.005 % ophthalmic solution INT 1 GTT INTO EACH EYE QHS.   metoprolol succinate (TOPROL-XL) 50 MG 24 hr tablet TAKE 1 TABLET BY MOUTH EVERY NIGHT   nitroGLYCERIN (NITROSTAT) 0.4 MG SL tablet Place 0.4 mg under the tongue every 5 (five) minutes as needed for chest pain.   timolol (TIMOPTIC) 0.25 % ophthalmic solution INSTILL ONE DROP INTO BOTH EYES EVERY MORNING.   TURMERIC PO Take 1 tablet by mouth daily.       08/17/2022    2:11 PM 07/12/2022    4:32 PM 04/30/2022    1:32 PM 06/29/2021    1:41 PM  GAD 7 : Generalized Anxiety Score  Nervous, Anxious, on Edge 0 1 0 0  Control/stop worrying 0 1 0 1  Worry too much - different things 0 0 0 0  Trouble relaxing 0 0 0 0  Restless 0 0 0 0  Easily annoyed or irritable 0 0 0 0  Afraid - awful might happen 0 0 0 0  Total GAD 7 Score 0 2 0 1  Anxiety Difficulty Not difficult at all Not difficult at all Not difficult at all        08/17/2022    2:11 PM 07/12/2022    4:32 PM 04/30/2022    1:31 PM  Depression screen PHQ 2/9  Decreased Interest 0 0 1  Down, Depressed, Hopeless 0 0 0  PHQ - 2 Score 0 0 1  Altered sleeping 1 1 0  Tired, decreased energy '2 2 1  '$ Change in appetite 0 0 1  Feeling bad or failure about yourself  0 0 0  Trouble concentrating '1 1 1  '$ Moving slowly or fidgety/restless 1 1 0  Suicidal thoughts 0 0 0  PHQ-9 Score '5  5 4  '$ Difficult doing work/chores Somewhat difficult Somewhat difficult Not difficult at all    BP Readings from Last 3 Encounters:  09/03/22 128/68  08/17/22 110/68  07/12/22 138/62    Physical Exam Vitals and nursing note reviewed.  Constitutional:      General: She is not in acute distress.    Appearance: Normal appearance.  She is well-developed.  HENT:     Head: Normocephalic and atraumatic.  Cardiovascular:     Rate and Rhythm: Normal rate and regular rhythm.     Heart sounds: Normal heart sounds. No murmur heard.    Comments: To the mid tibia Both legs are slightly pink but with no focal redness or tenderness.  No increased warmth. Pulmonary:     Effort: Pulmonary effort is normal. No respiratory distress.     Breath sounds: No wheezing or rhonchi.  Musculoskeletal:     Cervical back: Normal range of motion.     Right lower leg: 2+ Pitting Edema present.     Left lower leg: 2+ Pitting Edema present.  Skin:    General: Skin is warm and dry.     Findings: No rash.  Neurological:     Mental Status: She is alert and oriented to person, place, and time.  Psychiatric:        Mood and Affect: Mood normal.        Behavior: Behavior normal.     Wt Readings from Last 3 Encounters:  09/03/22 119 lb (54 kg)  08/17/22 120 lb (54.4 kg)  07/12/22 117 lb (53.1 kg)    BP 128/68   Pulse 72   Ht 5' (1.524 m)   Wt 119 lb (54 kg)   SpO2 96%   BMI 23.24 kg/m   Assessment and Plan: 1. Edema of both lower legs Needs to elevate her legs regularly throughout the day. Limit sodium - may be difficult with the meals on wheels Do not think she can apply compression stockings herself - even the zip up type.  2. Cellulitis of left lower extremity Hx of LLE cellulitis - just completed antibiotics. With the symmetrical changes in both lower legs, infection is much less likely so will hold off on antibiotics at this time and see if she improves with elevation. Will get labs to help  guide treatment, especially if symptoms worsen. - CBC with Differential/Platelet - C-reactive protein - Sedimentation rate   Partially dictated using Editor, commissioning. Any errors are unintentional.  Halina Maidens, MD [facility]  09/03/2022

## 2022-09-03 NOTE — Patient Instructions (Signed)
Try elevating the legs above the heart for at least one hour twice a day.  Use recliner or the bed.  Do not add salt to your food.  Try to drink more water, black coffee or tea.  Flavored water is fine.

## 2022-09-04 ENCOUNTER — Other Ambulatory Visit: Payer: Self-pay | Admitting: Internal Medicine

## 2022-09-04 LAB — CBC WITH DIFFERENTIAL/PLATELET
Basophils Absolute: 0.1 10*3/uL (ref 0.0–0.2)
Basos: 2 %
EOS (ABSOLUTE): 0.2 10*3/uL (ref 0.0–0.4)
Eos: 4 %
Hematocrit: 36.4 % (ref 34.0–46.6)
Hemoglobin: 11.9 g/dL (ref 11.1–15.9)
Immature Grans (Abs): 0 10*3/uL (ref 0.0–0.1)
Immature Granulocytes: 0 %
Lymphocytes Absolute: 0.8 10*3/uL (ref 0.7–3.1)
Lymphs: 14 %
MCH: 30.7 pg (ref 26.6–33.0)
MCHC: 32.7 g/dL (ref 31.5–35.7)
MCV: 94 fL (ref 79–97)
Monocytes Absolute: 0.8 10*3/uL (ref 0.1–0.9)
Monocytes: 13 %
Neutrophils Absolute: 4.2 10*3/uL (ref 1.4–7.0)
Neutrophils: 67 %
Platelets: 297 10*3/uL (ref 150–450)
RBC: 3.88 x10E6/uL (ref 3.77–5.28)
RDW: 12.6 % (ref 11.7–15.4)
WBC: 6.1 10*3/uL (ref 3.4–10.8)

## 2022-09-04 LAB — SEDIMENTATION RATE: Sed Rate: 23 mm/hr (ref 0–40)

## 2022-09-04 LAB — C-REACTIVE PROTEIN: CRP: 4 mg/L (ref 0–10)

## 2022-09-06 ENCOUNTER — Other Ambulatory Visit: Payer: Self-pay

## 2022-09-06 DIAGNOSIS — Z87891 Personal history of nicotine dependence: Secondary | ICD-10-CM | POA: Diagnosis not present

## 2022-09-06 DIAGNOSIS — M810 Age-related osteoporosis without current pathological fracture: Secondary | ICD-10-CM | POA: Diagnosis not present

## 2022-09-06 DIAGNOSIS — I2510 Atherosclerotic heart disease of native coronary artery without angina pectoris: Secondary | ICD-10-CM | POA: Diagnosis not present

## 2022-09-06 DIAGNOSIS — Z853 Personal history of malignant neoplasm of breast: Secondary | ICD-10-CM | POA: Diagnosis not present

## 2022-09-06 DIAGNOSIS — S72001D Fracture of unspecified part of neck of right femur, subsequent encounter for closed fracture with routine healing: Secondary | ICD-10-CM | POA: Diagnosis not present

## 2022-09-06 DIAGNOSIS — Z791 Long term (current) use of non-steroidal anti-inflammatories (NSAID): Secondary | ICD-10-CM | POA: Diagnosis not present

## 2022-09-06 DIAGNOSIS — M11261 Other chondrocalcinosis, right knee: Secondary | ICD-10-CM | POA: Diagnosis not present

## 2022-09-06 DIAGNOSIS — Z9011 Acquired absence of right breast and nipple: Secondary | ICD-10-CM | POA: Diagnosis not present

## 2022-09-06 DIAGNOSIS — I10 Essential (primary) hypertension: Secondary | ICD-10-CM | POA: Diagnosis not present

## 2022-09-06 DIAGNOSIS — M19019 Primary osteoarthritis, unspecified shoulder: Secondary | ICD-10-CM | POA: Diagnosis not present

## 2022-09-06 DIAGNOSIS — D6869 Other thrombophilia: Secondary | ICD-10-CM | POA: Diagnosis not present

## 2022-09-06 DIAGNOSIS — I779 Disorder of arteries and arterioles, unspecified: Secondary | ICD-10-CM | POA: Diagnosis not present

## 2022-09-06 DIAGNOSIS — Z7901 Long term (current) use of anticoagulants: Secondary | ICD-10-CM | POA: Diagnosis not present

## 2022-09-06 DIAGNOSIS — Z951 Presence of aortocoronary bypass graft: Secondary | ICD-10-CM | POA: Diagnosis not present

## 2022-09-06 DIAGNOSIS — H401122 Primary open-angle glaucoma, left eye, moderate stage: Secondary | ICD-10-CM | POA: Diagnosis not present

## 2022-09-06 DIAGNOSIS — I4819 Other persistent atrial fibrillation: Secondary | ICD-10-CM | POA: Diagnosis not present

## 2022-09-06 NOTE — Telephone Encounter (Signed)
Requested medications are due for refill today.  yes  Requested medications are on the active medications list.  yes  Last refill. 06/09/2022 #180 0 rf  Future visit scheduled.   yes  Notes to clinic.  Some labs are expired.    Requested Prescriptions  Pending Prescriptions Disp Refills   ELIQUIS 2.5 MG TABS tablet [Pharmacy Med Name: ELIQUIS 2.'5MG'$  TABLETS] 180 tablet 0    Sig: TAKE 1 TABLET(2.5 MG) BY MOUTH TWICE DAILY     Hematology:  Anticoagulants - apixaban Failed - 09/04/2022 10:39 AM      Failed - Cr in normal range and within 360 days    Creatinine, Ser  Date Value Ref Range Status  06/17/2021 0.83 0.44 - 1.00 mg/dL Final         Failed - AST in normal range and within 360 days    AST  Date Value Ref Range Status  06/17/2021 16 15 - 41 U/L Final         Failed - ALT in normal range and within 360 days    ALT  Date Value Ref Range Status  06/17/2021 14 0 - 44 U/L Final         Passed - PLT in normal range and within 360 days    Platelets  Date Value Ref Range Status  09/03/2022 297 150 - 450 x10E3/uL Final         Passed - HGB in normal range and within 360 days    Hemoglobin  Date Value Ref Range Status  09/03/2022 11.9 11.1 - 15.9 g/dL Final         Passed - HCT in normal range and within 360 days    Hematocrit  Date Value Ref Range Status  09/03/2022 36.4 34.0 - 46.6 % Final         Passed - Valid encounter within last 12 months    Recent Outpatient Visits           3 days ago Edema of both lower legs   [place] [facility] at [facility], Jesse Sans, MD   2 weeks ago Cellulitis of left lower extremity   [place] [facility] at [facility], Jesse Sans, MD   1 month ago [place] and [facility] Medicine at [facility], Jesse Sans, MD   4 months ago Essential (primary) hypertension   [place] [facility] at [facility], Jesse Sans, MD   1 year ago Essential (primary) hypertension   [place] [facility] at [facility], Jesse Sans, MD       Future Appointments             In 1 month Army Melia, Jesse Sans, MD [facility] at [facility], [facility]

## 2022-09-07 DIAGNOSIS — Z9011 Acquired absence of right breast and nipple: Secondary | ICD-10-CM | POA: Diagnosis not present

## 2022-09-07 DIAGNOSIS — D6869 Other thrombophilia: Secondary | ICD-10-CM | POA: Diagnosis not present

## 2022-09-07 DIAGNOSIS — Z951 Presence of aortocoronary bypass graft: Secondary | ICD-10-CM | POA: Diagnosis not present

## 2022-09-07 DIAGNOSIS — Z7901 Long term (current) use of anticoagulants: Secondary | ICD-10-CM | POA: Diagnosis not present

## 2022-09-07 DIAGNOSIS — S72001D Fracture of unspecified part of neck of right femur, subsequent encounter for closed fracture with routine healing: Secondary | ICD-10-CM | POA: Diagnosis not present

## 2022-09-07 DIAGNOSIS — I2510 Atherosclerotic heart disease of native coronary artery without angina pectoris: Secondary | ICD-10-CM | POA: Diagnosis not present

## 2022-09-07 DIAGNOSIS — Z87891 Personal history of nicotine dependence: Secondary | ICD-10-CM | POA: Diagnosis not present

## 2022-09-07 DIAGNOSIS — I779 Disorder of arteries and arterioles, unspecified: Secondary | ICD-10-CM | POA: Diagnosis not present

## 2022-09-07 DIAGNOSIS — M11261 Other chondrocalcinosis, right knee: Secondary | ICD-10-CM | POA: Diagnosis not present

## 2022-09-07 DIAGNOSIS — M19019 Primary osteoarthritis, unspecified shoulder: Secondary | ICD-10-CM | POA: Diagnosis not present

## 2022-09-07 DIAGNOSIS — M810 Age-related osteoporosis without current pathological fracture: Secondary | ICD-10-CM | POA: Diagnosis not present

## 2022-09-07 DIAGNOSIS — H401122 Primary open-angle glaucoma, left eye, moderate stage: Secondary | ICD-10-CM | POA: Diagnosis not present

## 2022-09-07 DIAGNOSIS — Z853 Personal history of malignant neoplasm of breast: Secondary | ICD-10-CM | POA: Diagnosis not present

## 2022-09-07 DIAGNOSIS — I10 Essential (primary) hypertension: Secondary | ICD-10-CM | POA: Diagnosis not present

## 2022-09-07 DIAGNOSIS — Z791 Long term (current) use of non-steroidal anti-inflammatories (NSAID): Secondary | ICD-10-CM | POA: Diagnosis not present

## 2022-09-07 DIAGNOSIS — I4819 Other persistent atrial fibrillation: Secondary | ICD-10-CM | POA: Diagnosis not present

## 2022-09-08 DIAGNOSIS — M19019 Primary osteoarthritis, unspecified shoulder: Secondary | ICD-10-CM | POA: Diagnosis not present

## 2022-09-08 DIAGNOSIS — Z7901 Long term (current) use of anticoagulants: Secondary | ICD-10-CM | POA: Diagnosis not present

## 2022-09-08 DIAGNOSIS — Z951 Presence of aortocoronary bypass graft: Secondary | ICD-10-CM | POA: Diagnosis not present

## 2022-09-08 DIAGNOSIS — Z87891 Personal history of nicotine dependence: Secondary | ICD-10-CM | POA: Diagnosis not present

## 2022-09-08 DIAGNOSIS — I779 Disorder of arteries and arterioles, unspecified: Secondary | ICD-10-CM | POA: Diagnosis not present

## 2022-09-08 DIAGNOSIS — Z9011 Acquired absence of right breast and nipple: Secondary | ICD-10-CM | POA: Diagnosis not present

## 2022-09-08 DIAGNOSIS — D6869 Other thrombophilia: Secondary | ICD-10-CM | POA: Diagnosis not present

## 2022-09-08 DIAGNOSIS — S72001D Fracture of unspecified part of neck of right femur, subsequent encounter for closed fracture with routine healing: Secondary | ICD-10-CM | POA: Diagnosis not present

## 2022-09-08 DIAGNOSIS — I10 Essential (primary) hypertension: Secondary | ICD-10-CM | POA: Diagnosis not present

## 2022-09-08 DIAGNOSIS — M11261 Other chondrocalcinosis, right knee: Secondary | ICD-10-CM | POA: Diagnosis not present

## 2022-09-08 DIAGNOSIS — I2510 Atherosclerotic heart disease of native coronary artery without angina pectoris: Secondary | ICD-10-CM | POA: Diagnosis not present

## 2022-09-08 DIAGNOSIS — H401122 Primary open-angle glaucoma, left eye, moderate stage: Secondary | ICD-10-CM | POA: Diagnosis not present

## 2022-09-08 DIAGNOSIS — M810 Age-related osteoporosis without current pathological fracture: Secondary | ICD-10-CM | POA: Diagnosis not present

## 2022-09-08 DIAGNOSIS — Z853 Personal history of malignant neoplasm of breast: Secondary | ICD-10-CM | POA: Diagnosis not present

## 2022-09-08 DIAGNOSIS — I4819 Other persistent atrial fibrillation: Secondary | ICD-10-CM | POA: Diagnosis not present

## 2022-09-08 DIAGNOSIS — Z791 Long term (current) use of non-steroidal anti-inflammatories (NSAID): Secondary | ICD-10-CM | POA: Diagnosis not present

## 2022-09-09 DIAGNOSIS — S72001D Fracture of unspecified part of neck of right femur, subsequent encounter for closed fracture with routine healing: Secondary | ICD-10-CM | POA: Diagnosis not present

## 2022-09-09 DIAGNOSIS — M810 Age-related osteoporosis without current pathological fracture: Secondary | ICD-10-CM | POA: Diagnosis not present

## 2022-09-09 DIAGNOSIS — Z791 Long term (current) use of non-steroidal anti-inflammatories (NSAID): Secondary | ICD-10-CM | POA: Diagnosis not present

## 2022-09-09 DIAGNOSIS — Z87891 Personal history of nicotine dependence: Secondary | ICD-10-CM | POA: Diagnosis not present

## 2022-09-09 DIAGNOSIS — I4819 Other persistent atrial fibrillation: Secondary | ICD-10-CM | POA: Diagnosis not present

## 2022-09-09 DIAGNOSIS — I2510 Atherosclerotic heart disease of native coronary artery without angina pectoris: Secondary | ICD-10-CM | POA: Diagnosis not present

## 2022-09-09 DIAGNOSIS — Z9011 Acquired absence of right breast and nipple: Secondary | ICD-10-CM | POA: Diagnosis not present

## 2022-09-09 DIAGNOSIS — I779 Disorder of arteries and arterioles, unspecified: Secondary | ICD-10-CM | POA: Diagnosis not present

## 2022-09-09 DIAGNOSIS — Z951 Presence of aortocoronary bypass graft: Secondary | ICD-10-CM | POA: Diagnosis not present

## 2022-09-09 DIAGNOSIS — M11261 Other chondrocalcinosis, right knee: Secondary | ICD-10-CM | POA: Diagnosis not present

## 2022-09-09 DIAGNOSIS — I10 Essential (primary) hypertension: Secondary | ICD-10-CM | POA: Diagnosis not present

## 2022-09-09 DIAGNOSIS — Z7901 Long term (current) use of anticoagulants: Secondary | ICD-10-CM | POA: Diagnosis not present

## 2022-09-09 DIAGNOSIS — H401122 Primary open-angle glaucoma, left eye, moderate stage: Secondary | ICD-10-CM | POA: Diagnosis not present

## 2022-09-09 DIAGNOSIS — Z853 Personal history of malignant neoplasm of breast: Secondary | ICD-10-CM | POA: Diagnosis not present

## 2022-09-09 DIAGNOSIS — M19019 Primary osteoarthritis, unspecified shoulder: Secondary | ICD-10-CM | POA: Diagnosis not present

## 2022-09-09 DIAGNOSIS — D6869 Other thrombophilia: Secondary | ICD-10-CM | POA: Diagnosis not present

## 2022-09-13 DIAGNOSIS — Z791 Long term (current) use of non-steroidal anti-inflammatories (NSAID): Secondary | ICD-10-CM | POA: Diagnosis not present

## 2022-09-13 DIAGNOSIS — M810 Age-related osteoporosis without current pathological fracture: Secondary | ICD-10-CM | POA: Diagnosis not present

## 2022-09-13 DIAGNOSIS — Z87891 Personal history of nicotine dependence: Secondary | ICD-10-CM | POA: Diagnosis not present

## 2022-09-13 DIAGNOSIS — H401122 Primary open-angle glaucoma, left eye, moderate stage: Secondary | ICD-10-CM | POA: Diagnosis not present

## 2022-09-13 DIAGNOSIS — I4819 Other persistent atrial fibrillation: Secondary | ICD-10-CM | POA: Diagnosis not present

## 2022-09-13 DIAGNOSIS — M19019 Primary osteoarthritis, unspecified shoulder: Secondary | ICD-10-CM | POA: Diagnosis not present

## 2022-09-13 DIAGNOSIS — Z9011 Acquired absence of right breast and nipple: Secondary | ICD-10-CM | POA: Diagnosis not present

## 2022-09-13 DIAGNOSIS — I2510 Atherosclerotic heart disease of native coronary artery without angina pectoris: Secondary | ICD-10-CM | POA: Diagnosis not present

## 2022-09-13 DIAGNOSIS — Z7901 Long term (current) use of anticoagulants: Secondary | ICD-10-CM | POA: Diagnosis not present

## 2022-09-13 DIAGNOSIS — I779 Disorder of arteries and arterioles, unspecified: Secondary | ICD-10-CM | POA: Diagnosis not present

## 2022-09-13 DIAGNOSIS — Z951 Presence of aortocoronary bypass graft: Secondary | ICD-10-CM | POA: Diagnosis not present

## 2022-09-13 DIAGNOSIS — M11261 Other chondrocalcinosis, right knee: Secondary | ICD-10-CM | POA: Diagnosis not present

## 2022-09-13 DIAGNOSIS — Z853 Personal history of malignant neoplasm of breast: Secondary | ICD-10-CM | POA: Diagnosis not present

## 2022-09-13 DIAGNOSIS — I10 Essential (primary) hypertension: Secondary | ICD-10-CM | POA: Diagnosis not present

## 2022-09-13 DIAGNOSIS — D6869 Other thrombophilia: Secondary | ICD-10-CM | POA: Diagnosis not present

## 2022-09-13 DIAGNOSIS — S72001D Fracture of unspecified part of neck of right femur, subsequent encounter for closed fracture with routine healing: Secondary | ICD-10-CM | POA: Diagnosis not present

## 2022-09-15 DIAGNOSIS — Z961 Presence of intraocular lens: Secondary | ICD-10-CM | POA: Diagnosis not present

## 2022-09-15 DIAGNOSIS — H04123 Dry eye syndrome of bilateral lacrimal glands: Secondary | ICD-10-CM | POA: Diagnosis not present

## 2022-09-15 DIAGNOSIS — H401132 Primary open-angle glaucoma, bilateral, moderate stage: Secondary | ICD-10-CM | POA: Diagnosis not present

## 2022-09-17 DIAGNOSIS — I2510 Atherosclerotic heart disease of native coronary artery without angina pectoris: Secondary | ICD-10-CM | POA: Diagnosis not present

## 2022-09-17 DIAGNOSIS — H401122 Primary open-angle glaucoma, left eye, moderate stage: Secondary | ICD-10-CM | POA: Diagnosis not present

## 2022-09-17 DIAGNOSIS — D6869 Other thrombophilia: Secondary | ICD-10-CM | POA: Diagnosis not present

## 2022-09-17 DIAGNOSIS — Z7901 Long term (current) use of anticoagulants: Secondary | ICD-10-CM | POA: Diagnosis not present

## 2022-09-17 DIAGNOSIS — Z87891 Personal history of nicotine dependence: Secondary | ICD-10-CM | POA: Diagnosis not present

## 2022-09-17 DIAGNOSIS — I779 Disorder of arteries and arterioles, unspecified: Secondary | ICD-10-CM | POA: Diagnosis not present

## 2022-09-17 DIAGNOSIS — M810 Age-related osteoporosis without current pathological fracture: Secondary | ICD-10-CM | POA: Diagnosis not present

## 2022-09-17 DIAGNOSIS — Z951 Presence of aortocoronary bypass graft: Secondary | ICD-10-CM | POA: Diagnosis not present

## 2022-09-17 DIAGNOSIS — Z791 Long term (current) use of non-steroidal anti-inflammatories (NSAID): Secondary | ICD-10-CM | POA: Diagnosis not present

## 2022-09-17 DIAGNOSIS — I4819 Other persistent atrial fibrillation: Secondary | ICD-10-CM | POA: Diagnosis not present

## 2022-09-17 DIAGNOSIS — I10 Essential (primary) hypertension: Secondary | ICD-10-CM | POA: Diagnosis not present

## 2022-09-17 DIAGNOSIS — M11261 Other chondrocalcinosis, right knee: Secondary | ICD-10-CM | POA: Diagnosis not present

## 2022-09-17 DIAGNOSIS — M19019 Primary osteoarthritis, unspecified shoulder: Secondary | ICD-10-CM | POA: Diagnosis not present

## 2022-09-17 DIAGNOSIS — Z9011 Acquired absence of right breast and nipple: Secondary | ICD-10-CM | POA: Diagnosis not present

## 2022-09-17 DIAGNOSIS — Z853 Personal history of malignant neoplasm of breast: Secondary | ICD-10-CM | POA: Diagnosis not present

## 2022-09-17 DIAGNOSIS — S72001D Fracture of unspecified part of neck of right femur, subsequent encounter for closed fracture with routine healing: Secondary | ICD-10-CM | POA: Diagnosis not present

## 2022-09-22 DIAGNOSIS — M810 Age-related osteoporosis without current pathological fracture: Secondary | ICD-10-CM | POA: Diagnosis not present

## 2022-09-22 DIAGNOSIS — I10 Essential (primary) hypertension: Secondary | ICD-10-CM | POA: Diagnosis not present

## 2022-09-22 DIAGNOSIS — M19019 Primary osteoarthritis, unspecified shoulder: Secondary | ICD-10-CM | POA: Diagnosis not present

## 2022-09-22 DIAGNOSIS — I779 Disorder of arteries and arterioles, unspecified: Secondary | ICD-10-CM | POA: Diagnosis not present

## 2022-09-22 DIAGNOSIS — H401122 Primary open-angle glaucoma, left eye, moderate stage: Secondary | ICD-10-CM | POA: Diagnosis not present

## 2022-09-22 DIAGNOSIS — I4819 Other persistent atrial fibrillation: Secondary | ICD-10-CM | POA: Diagnosis not present

## 2022-09-22 DIAGNOSIS — S72001D Fracture of unspecified part of neck of right femur, subsequent encounter for closed fracture with routine healing: Secondary | ICD-10-CM | POA: Diagnosis not present

## 2022-09-22 DIAGNOSIS — Z7901 Long term (current) use of anticoagulants: Secondary | ICD-10-CM | POA: Diagnosis not present

## 2022-09-22 DIAGNOSIS — I2510 Atherosclerotic heart disease of native coronary artery without angina pectoris: Secondary | ICD-10-CM | POA: Diagnosis not present

## 2022-09-22 DIAGNOSIS — D6869 Other thrombophilia: Secondary | ICD-10-CM | POA: Diagnosis not present

## 2022-09-22 DIAGNOSIS — M11261 Other chondrocalcinosis, right knee: Secondary | ICD-10-CM | POA: Diagnosis not present

## 2022-09-22 DIAGNOSIS — Z87891 Personal history of nicotine dependence: Secondary | ICD-10-CM | POA: Diagnosis not present

## 2022-09-22 DIAGNOSIS — Z9011 Acquired absence of right breast and nipple: Secondary | ICD-10-CM | POA: Diagnosis not present

## 2022-09-22 DIAGNOSIS — Z951 Presence of aortocoronary bypass graft: Secondary | ICD-10-CM | POA: Diagnosis not present

## 2022-09-22 DIAGNOSIS — Z791 Long term (current) use of non-steroidal anti-inflammatories (NSAID): Secondary | ICD-10-CM | POA: Diagnosis not present

## 2022-09-22 DIAGNOSIS — Z853 Personal history of malignant neoplasm of breast: Secondary | ICD-10-CM | POA: Diagnosis not present

## 2022-10-02 ENCOUNTER — Other Ambulatory Visit: Payer: Self-pay | Admitting: Internal Medicine

## 2022-10-04 NOTE — Telephone Encounter (Signed)
Requested Prescriptions  Pending Prescriptions Disp Refills   amLODipine (NORVASC) 5 MG tablet [Pharmacy Med Name: AMLODIPINE BESYLATE '5MG'$  TABLETS] 180 tablet 0    Sig: TAKE 1 TABLET(5 MG) BY MOUTH TWICE DAILY     Cardiovascular: Calcium Channel Blockers 2 Passed - 10/02/2022  6:55 PM      Passed - Last BP in normal range    BP Readings from Last 1 Encounters:  09/03/22 128/68         Passed - Last Heart Rate in normal range    Pulse Readings from Last 1 Encounters:  09/03/22 72         Passed - Valid encounter within last 6 months    Recent Outpatient Visits           1 month ago Edema of both lower legs   [place] [facility] at [facility], Jesse Sans, MD   1 month ago Cellulitis of left lower extremity   [place] [facility] at [facility], Jesse Sans, MD   2 months ago Confusion   [place] [facility] at [facility], Jesse Sans, MD   5 months ago Essential (primary) hypertension   [place] [facility] and [facility] Medicine at [facility], Jesse Sans, MD   1 year ago Essential (primary) hypertension   [place] [facility] at [facility], Jesse Sans, MD       Future Appointments             In 1 month Army Melia, Jesse Sans, MD [place] [facility] and [facility] Medicine at [facility]

## 2022-10-14 DIAGNOSIS — Z66 Do not resuscitate: Secondary | ICD-10-CM | POA: Diagnosis not present

## 2022-10-14 DIAGNOSIS — I2510 Atherosclerotic heart disease of native coronary artery without angina pectoris: Secondary | ICD-10-CM | POA: Diagnosis not present

## 2022-10-14 DIAGNOSIS — I10 Essential (primary) hypertension: Secondary | ICD-10-CM | POA: Diagnosis not present

## 2022-10-14 DIAGNOSIS — M1611 Unilateral primary osteoarthritis, right hip: Secondary | ICD-10-CM | POA: Diagnosis not present

## 2022-10-14 DIAGNOSIS — S72111D Displaced fracture of greater trochanter of right femur, subsequent encounter for closed fracture with routine healing: Secondary | ICD-10-CM | POA: Diagnosis not present

## 2022-10-14 DIAGNOSIS — S72141D Displaced intertrochanteric fracture of right femur, subsequent encounter for closed fracture with routine healing: Secondary | ICD-10-CM | POA: Diagnosis not present

## 2022-11-03 ENCOUNTER — Encounter: Payer: Self-pay | Admitting: Internal Medicine

## 2022-11-03 ENCOUNTER — Ambulatory Visit (INDEPENDENT_AMBULATORY_CARE_PROVIDER_SITE_OTHER): Payer: Medicare Other | Admitting: Internal Medicine

## 2022-11-03 VITALS — BP 110/58 | HR 85 | Ht 60.0 in | Wt 118.0 lb

## 2022-11-03 DIAGNOSIS — Z853 Personal history of malignant neoplasm of breast: Secondary | ICD-10-CM

## 2022-11-03 DIAGNOSIS — I10 Essential (primary) hypertension: Secondary | ICD-10-CM

## 2022-11-03 DIAGNOSIS — F0390 Unspecified dementia without behavioral disturbance: Secondary | ICD-10-CM

## 2022-11-03 DIAGNOSIS — I4819 Other persistent atrial fibrillation: Secondary | ICD-10-CM | POA: Diagnosis not present

## 2022-11-03 DIAGNOSIS — D6869 Other thrombophilia: Secondary | ICD-10-CM

## 2022-11-03 DIAGNOSIS — M11261 Other chondrocalcinosis, right knee: Secondary | ICD-10-CM | POA: Diagnosis not present

## 2022-11-03 NOTE — Progress Notes (Signed)
Date:  11/03/2022   Name:  Donna Santos   DOB:  Aug 07, 1925   MRN:  [id number]   Chief Complaint: Hypertension  Hypertension This is a chronic problem. The problem is controlled. Associated symptoms include peripheral edema. Pertinent negatives include no chest pain, headaches or shortness of breath. Past treatments include beta blockers, direct vasodilators and calcium channel blockers. The current treatment provides significant improvement. Hypertensive end-organ damage includes CAD/MI (Afib). There is no history of kidney disease or CVA.  Atrial fibrillation - rate controlled and on Eliquis.  No bleeding issues noted.   Hx breast cancer - recommend continuing to get left mammograms.  She denies any masses or pain.  Lab Results  Component Value Date   NA 136 06/17/2021   K 3.5 06/17/2021   CO2 27 06/17/2021   GLUCOSE 126 (H) 06/17/2021   BUN 16 06/17/2021   CREATININE 0.83 06/17/2021   CALCIUM 9.1 06/17/2021   GFRNONAA >60 06/17/2021   Lab Results  Component Value Date   CHOL 261 (A) 07/24/2013   HDL 44 07/24/2013   LDLCALC 179 07/24/2013   TRIG 192 (A) 07/24/2013   Lab Results  Component Value Date   TSH 3.300 05/02/2019   No results found for: "HGBA1C" Lab Results  Component Value Date   WBC 6.1 09/03/2022   HGB 11.9 09/03/2022   HCT 36.4 09/03/2022   MCV 94 09/03/2022   PLT 297 09/03/2022   Lab Results  Component Value Date   ALT 14 06/17/2021   AST 16 06/17/2021   ALKPHOS 38 06/17/2021   BILITOT 0.6 06/17/2021   Lab Results  Component Value Date   VD25OH 46.0 05/02/2019     Review of Systems  Constitutional:  Negative for chills, fatigue and fever.  HENT:  Negative for trouble swallowing.   Respiratory:  Negative for chest tightness, shortness of breath and wheezing.   Cardiovascular:  Positive for leg swelling. Negative for chest pain.  Musculoskeletal:  Positive for arthralgias, gait problem and joint swelling.  Neurological:  Negative for  dizziness, light-headedness and headaches.  Psychiatric/Behavioral:  Positive for confusion. Negative for dysphoric mood and sleep disturbance. The patient is not nervous/anxious.     Patient Active Problem List   Diagnosis Date Noted   Edema of both lower legs 09/03/2022   Closed right hip fracture, with routine healing, subsequent encounter 07/12/2022   Acquired thrombophilia ([place]) 09/25/2020   Pseudogout of right knee 08/04/2020   Aortic atherosclerosis ([place]) 07/10/2020   Hyponatremia 06/16/2020   Hypokalemia 12/17/2019   Chest wall recurrence of right breast cancer ([place]) 11/16/2019   Osteoporosis 06/17/2019   Primary open angle glaucoma (POAG) of left eye, moderate stage 04/25/2018   Persistent atrial fibrillation ([place]) 12/28/2016   Carcinoma of overlapping sites of right breast in female, estrogen receptor positive ([place]) 12/21/2016   Senile dementia, uncomplicated ([place]) 95/07/3266   Low back pain 04/12/2016   Impaired renal function 05/09/2015   Arteriosclerosis of coronary artery 05/09/2015   Essential (primary) hypertension 05/09/2015   Personal history of malignant neoplasm of breast 05/09/2015   Muscle spasms of neck 05/09/2015   Arthritis of shoulder region, degenerative 05/09/2015   Hypercholesteremia 04/20/2012   Carotid artery disease ([place]) 05/28/2005    Allergies  Allergen Reactions   Ace Inhibitors     Other reaction(s): Angioedema   Brinzolamide-Brimonidine     Past Surgical History:  Procedure Laterality Date   ARTHROTOMY KNEE-INFEC W/EXPLOR/DRAIN/REMOV FB Right 02/2022   CAROTID ENDARTERECTOMY  CORONARY ARTERY BYPASS GRAFT  2006   MASS EXCISION Right 08/28/2015   Procedure: EXCISION MASS; remove massive right chest wall;  Surgeon: Florene Glen, MD;  Location: [facility];  Service: General;  Laterality: Right;   MASTECTOMY, RADICAL Right 2002   BREAST CA   TOTAL ABDOMINAL HYSTERECTOMY      Social History   Tobacco Use   Smoking status: Former     Packs/day: 1.50    Years: 1.50    Total pack years: 2.25    Types: Cigarettes    Quit date: 10/04/1952    Years since quitting: 70.1   Smokeless tobacco: Never  Vaping Use   Vaping Use: Never used  Substance Use Topics   Alcohol use: No    Alcohol/week: 0.0 standard drinks of alcohol   Drug use: No     Medication list has been reviewed and updated.  Current Meds  Medication Sig   amLODipine (NORVASC) 5 MG tablet TAKE 1 TABLET(5 MG) BY MOUTH TWICE DAILY   aspirin EC 81 MG tablet Take 81 mg by mouth daily.   Calcium Carb-Cholecalciferol 600-800 MG-UNIT TABS Take 1 tablet by mouth daily. Reported on 12/25/2015   Cholecalciferol (VITAMIN D-3) 125 MCG (5000 UT) TABS Take 1 tablet by mouth daily.   Cranberry-Vitamin C-Probiotic (AZO CRANBERRY PO) Take by mouth in the morning and at bedtime.   ELIQUIS 2.5 MG TABS tablet TAKE 1 TABLET(2.5 MG) BY MOUTH TWICE DAILY   isosorbide mononitrate (IMDUR) 30 MG 24 hr tablet TAKE 1 TABLET(30 MG) BY MOUTH DAILY   latanoprost (XALATAN) 0.005 % ophthalmic solution INT 1 GTT INTO EACH EYE QHS.   metoprolol succinate (TOPROL-XL) 50 MG 24 hr tablet TAKE 1 TABLET BY MOUTH EVERY NIGHT   nitroGLYCERIN (NITROSTAT) 0.4 MG SL tablet Place 0.4 mg under the tongue every 5 (five) minutes as needed for chest pain.   timolol (TIMOPTIC) 0.25 % ophthalmic solution INSTILL ONE DROP INTO BOTH EYES EVERY MORNING.   TURMERIC PO Take 1 tablet by mouth daily.       09/03/2022    4:02 PM 08/17/2022    2:11 PM 07/12/2022    4:32 PM 04/30/2022    1:32 PM  GAD 7 : Generalized Anxiety Score  Nervous, Anxious, on Edge 0 0 1 0  Control/stop worrying 1 0 1 0  Worry too much - different things 0 0 0 0  Trouble relaxing 0 0 0 0  Restless 0 0 0 0  Easily annoyed or irritable 0 0 0 0  Afraid - awful might happen 0 0 0 0  Total GAD 7 Score 1 0 2 0  Anxiety Difficulty Not difficult at all Not difficult at all Not difficult at all Not difficult at all       09/03/2022     4:01 PM 08/17/2022    2:11 PM 07/12/2022    4:32 PM  Depression screen PHQ 2/9  Decreased Interest 0 0 0  Down, Depressed, Hopeless 0 0 0  PHQ - 2 Score 0 0 0  Altered sleeping 0 1 1  Tired, decreased energy '1 2 2  '$ Change in appetite 0 0 0  Feeling bad or failure about yourself  0 0 0  Trouble concentrating 0 1 1  Moving slowly or fidgety/restless 0 1 1  Suicidal thoughts 0 0 0  PHQ-9 Score '1 5 5  '$ Difficult doing work/chores Not difficult at all Somewhat difficult Somewhat difficult    BP Readings from Last 3  Encounters:  11/03/22 (!) 110/58  09/03/22 128/68  08/17/22 110/68    Physical Exam Constitutional:      General: She is not in acute distress. Cardiovascular:     Rate and Rhythm: Normal rate. Rhythm irregular.     Heart sounds: No murmur heard. Pulmonary:     Effort: Pulmonary effort is normal.     Breath sounds: No wheezing or rhonchi.  Musculoskeletal:        General: Swelling (mild LE swelling) present.     Cervical back: Normal range of motion.  Lymphadenopathy:     Cervical: No cervical adenopathy.  Skin:    General: Skin is warm and dry.  Neurological:     General: No focal deficit present.     Mental Status: She is alert.     Gait: Gait abnormal (in wheel chair).     Wt Readings from Last 3 Encounters:  11/03/22 118 lb (53.5 kg)  09/03/22 119 lb (54 kg)  08/17/22 120 lb (54.4 kg)    BP (!) 110/58   Pulse 85   Ht 5' (1.524 m)   Wt 118 lb (53.5 kg)   SpO2 96%   BMI 23.05 kg/m   Assessment and Plan: 1. Essential (primary) hypertension Clinically stable exam with well controlled BP. Tolerating medications without side effects at this time. Pt to continue current regimen and low sodium diet;  - Basic metabolic panel - TSH  2. Pseudogout of right knee Recommend Tylenol 1000 mg bid scheduled  3. Acquired thrombophilia ([place]) On Eliquis with minimal bleeding/bruising issues  4. Personal history of malignant neoplasm of breast - MM Digital  Screening Unilat L; Future  5. Persistent atrial fibrillation (HCC) Rate controlled on beta blockers  6. Senile dementia, uncomplicated ([place]) Stable Supportive family Continues to live alone; has Life line, gets meals on wheels   Partially dictated using Editor, commissioning. Any errors are unintentional.  Halina Maidens, MD [facility]  11/03/2022

## 2022-11-03 NOTE — Patient Instructions (Signed)
Call [facility] to schedule your mammogram at [phone].  

## 2022-11-04 LAB — BASIC METABOLIC PANEL
BUN/Creatinine Ratio: 18 (ref 12–28)
BUN: 17 mg/dL (ref 10–36)
CO2: 22 mmol/L (ref 20–29)
Calcium: 10.1 mg/dL (ref 8.7–10.3)
Chloride: 99 mmol/L (ref 96–106)
Creatinine, Ser: 0.92 mg/dL (ref 0.57–1.00)
Glucose: 98 mg/dL (ref 70–99)
Potassium: 4.2 mmol/L (ref 3.5–5.2)
Sodium: 137 mmol/L (ref 134–144)
eGFR: 57 mL/min/{1.73_m2} — ABNORMAL LOW (ref >59)

## 2022-11-04 LAB — TSH: TSH: 2.93 u[IU]/mL (ref 0.450–4.500)

## 2022-11-05 ENCOUNTER — Ambulatory Visit: Payer: Self-pay | Admitting: *Deleted

## 2022-11-05 NOTE — Progress Notes (Signed)
PEC gave pts son results.  KP

## 2022-11-05 NOTE — Telephone Encounter (Signed)
Reason for Disposition  [1] Follow-up call to recent contact AND [2] information only call, no triage required  Answer Assessment - Initial Assessment Questions 1. REASON FOR CALL or QUESTION: "What is your reason for calling today?" or "How can I best help you?" or "What question do you have that I can help answer?"     Son Pier Laux called in and was given the lab result message from Dr. Army Melia dated 09/05/2022 at 7:51 PM.  After ending the call I realized the date did not correspond.   I checked again and there was another lab result message for 11/04/2022 at 1:39 PM from Dr. Army Melia.  I called Marita Snellen back and read him the message from 11/04/2022 from Dr. Army Melia.    He thanked me for calling him back and letting him know the correct message.  Protocols used: Information Only Call - No Triage-A-AH

## 2022-11-05 NOTE — Telephone Encounter (Signed)
Noted  KP 

## 2022-11-11 ENCOUNTER — Other Ambulatory Visit: Payer: Self-pay | Admitting: Internal Medicine

## 2022-11-11 DIAGNOSIS — I2510 Atherosclerotic heart disease of native coronary artery without angina pectoris: Secondary | ICD-10-CM

## 2022-12-03 ENCOUNTER — Other Ambulatory Visit: Payer: Self-pay | Admitting: Internal Medicine

## 2022-12-20 ENCOUNTER — Telehealth: Payer: Self-pay | Admitting: Internal Medicine

## 2022-12-20 NOTE — Telephone Encounter (Signed)
Copied from [place] [id number]. Topic: Medicare AWV >> Dec 20, 2022  1:43 PM Devoria Glassing wrote: Reason for CRM: Attempted to schedule AWV. Unable to LVM.  Mailbox full. Will try at later time.

## 2022-12-24 ENCOUNTER — Other Ambulatory Visit: Payer: Self-pay | Admitting: Internal Medicine

## 2022-12-24 NOTE — Telephone Encounter (Signed)
Requested Prescriptions  Pending Prescriptions Disp Refills   metoprolol succinate (TOPROL-XL) 50 MG 24 hr tablet [Pharmacy Med Name: METOPROLOL ER SUCCINATE '50MG'$  TABS] 90 tablet 1    Sig: TAKE 1 TABLET BY MOUTH EVERY NIGHT     Cardiovascular:  Beta Blockers Passed - 12/24/2022  8:00 AM      Passed - Last BP in normal range    BP Readings from Last 1 Encounters:  11/03/22 (!) 110/58         Passed - Last Heart Rate in normal range    Pulse Readings from Last 1 Encounters:  11/03/22 85         Passed - Valid encounter within last 6 months    Recent Outpatient Visits           1 month ago Essential (primary) hypertension   [place] [facility] at [facility], Jesse Sans, MD   3 months ago Edema of both lower legs   [place] [facility] at [facility], Jesse Sans, MD   4 months ago Cellulitis of left lower extremity   [place] [facility] at [facility], Jesse Sans, MD   5 months ago [place] at [facility], Jesse Sans, MD   7 months ago Essential (primary) hypertension   [facility] at [facility], Jesse Sans, MD       Future Appointments             In 4 months Army Melia, Jesse Sans, MD [place] at [facility], [facility]

## 2022-12-30 ENCOUNTER — Other Ambulatory Visit: Payer: Self-pay | Admitting: Internal Medicine

## 2022-12-30 DIAGNOSIS — I2510 Atherosclerotic heart disease of native coronary artery without angina pectoris: Secondary | ICD-10-CM | POA: Diagnosis not present

## 2022-12-30 DIAGNOSIS — R410 Disorientation, unspecified: Secondary | ICD-10-CM | POA: Diagnosis not present

## 2022-12-30 DIAGNOSIS — I4891 Unspecified atrial fibrillation: Secondary | ICD-10-CM | POA: Diagnosis not present

## 2022-12-30 DIAGNOSIS — I10 Essential (primary) hypertension: Secondary | ICD-10-CM | POA: Diagnosis not present

## 2022-12-30 DIAGNOSIS — Z87891 Personal history of nicotine dependence: Secondary | ICD-10-CM | POA: Diagnosis not present

## 2022-12-30 DIAGNOSIS — Z1152 Encounter for screening for COVID-19: Secondary | ICD-10-CM | POA: Diagnosis not present

## 2022-12-30 DIAGNOSIS — Z7982 Long term (current) use of aspirin: Secondary | ICD-10-CM | POA: Diagnosis not present

## 2022-12-30 DIAGNOSIS — Z79899 Other long term (current) drug therapy: Secondary | ICD-10-CM | POA: Diagnosis not present

## 2022-12-30 DIAGNOSIS — Z7901 Long term (current) use of anticoagulants: Secondary | ICD-10-CM | POA: Diagnosis not present

## 2022-12-30 NOTE — Telephone Encounter (Signed)
Requested Prescriptions  Pending Prescriptions Disp Refills   amLODipine (NORVASC) 5 MG tablet [Pharmacy Med Name: AMLODIPINE BESYLATE '5MG'$  TABLETS] 180 tablet 0    Sig: TAKE 1 TABLET(5 MG) BY MOUTH TWICE DAILY     Cardiovascular: Calcium Channel Blockers 2 Passed - 12/30/2022 11:33 AM      Passed - Last BP in normal range    BP Readings from Last 1 Encounters:  11/03/22 (!) 110/58         Passed - Last Heart Rate in normal range    Pulse Readings from Last 1 Encounters:  11/03/22 85         Passed - Valid encounter within last 6 months    Recent Outpatient Visits           1 month ago Essential (primary) hypertension   [place] [facility] at [facility], Jesse Sans, MD   3 months ago Edema of both lower legs   [place] [facility] at [name], Jesse Sans, MD   4 months ago Cellulitis of left lower extremity    [facility] Care [facility] Sports Medicine at [facility], Jesse Sans, MD   5 months ago [place] at [facility], Jesse Sans, MD   8 months ago Essential (primary) hypertension   [facility] at [facility], Jesse Sans, MD       Future Appointments             In 4 months Army Melia, Jesse Sans, MD [place] at [facility], [facility]

## 2023-01-06 ENCOUNTER — Telehealth: Payer: Self-pay | Admitting: Internal Medicine

## 2023-01-06 NOTE — Telephone Encounter (Signed)
Spoke with patient's son he stated patient was in the ED last week and declined AWV do to her having confusion and wants a FU the pcp first.

## 2023-02-05 ENCOUNTER — Other Ambulatory Visit: Payer: Self-pay | Admitting: Internal Medicine

## 2023-02-05 DIAGNOSIS — I2510 Atherosclerotic heart disease of native coronary artery without angina pectoris: Secondary | ICD-10-CM

## 2023-03-11 ENCOUNTER — Other Ambulatory Visit: Payer: Self-pay | Admitting: Internal Medicine

## 2023-03-11 ENCOUNTER — Telehealth: Payer: Self-pay | Admitting: Internal Medicine

## 2023-03-11 NOTE — Telephone Encounter (Signed)
Unable to refill per protocol, Rx request is too soon. Last refill 12/03/22 for 90 and 1 refill.  Requested Prescriptions  Pending Prescriptions Disp Refills   ELIQUIS 2.5 MG TABS tablet [Pharmacy Med Name: ELIQUIS 2.5MG  TABLETS] 180 tablet 1    Sig: TAKE 1 TABLET(2.5 MG) BY MOUTH TWICE DAILY     Hematology:  Anticoagulants - apixaban Failed - 03/11/2023  8:00 AM      Failed - AST in normal range and within 360 days    AST  Date Value Ref Range Status  06/17/2021 16 15 - 41 U/L Final         Failed - ALT in normal range and within 360 days    ALT  Date Value Ref Range Status  06/17/2021 14 0 - 44 U/L Final         Passed - PLT in normal range and within 360 days    Platelets  Date Value Ref Range Status  09/03/2022 297 150 - 450 x10E3/uL Final         Passed - HGB in normal range and within 360 days    Hemoglobin  Date Value Ref Range Status  09/03/2022 11.9 11.1 - 15.9 g/dL Final         Passed - HCT in normal range and within 360 days    Hematocrit  Date Value Ref Range Status  09/03/2022 36.4 34.0 - 46.6 % Final         Passed - Cr in normal range and within 360 days    Creatinine, Ser  Date Value Ref Range Status  11/03/2022 0.92 0.57 - 1.00 mg/dL Final         Passed - Valid encounter within last 12 months    Recent Outpatient Visits           4 months ago Essential (primary) hypertension   [place] [facility] at [facility], Nyoka Cowden, MD   6 months ago Edema of both lower legs   [facility] at [facility], Nyoka Cowden, MD   6 months ago Cellulitis of left lower extremity   [place] [facility] at [facility], Nyoka Cowden, MD   8 months ago Confusion   [facility] at [facility], Nyoka Cowden, MD   10 months ago Essential (primary) hypertension   [facility] Medicine at [facility], Nyoka Cowden, MD       Future Appointments             In 1 month Judithann Graves, Nyoka Cowden, MD [facility] at [facility], [facility]

## 2023-03-11 NOTE — Telephone Encounter (Signed)
Copied from CRM [id number]. Topic: General - Other >> Mar 11, 2023  3:08 PM Everette C wrote: Reason for CRM: The patient's son has been notified by their pharmacy that an updated prescription will need to be submitted for the patient's apixaban (ELIQUIS) 2.5 MG TABS tablet [[id number]]  The patient has been told to verify with the practice that refills will be continued  Please contact the patient further when possible

## 2023-03-14 ENCOUNTER — Other Ambulatory Visit: Payer: Self-pay | Admitting: Internal Medicine

## 2023-03-14 MED ORDER — APIXABAN 2.5 MG PO TABS: ORAL_TABLET | ORAL | 3 refills | Status: DC

## 2023-03-14 NOTE — Telephone Encounter (Signed)
Please review.  KP

## 2023-03-14 NOTE — Telephone Encounter (Signed)
Called son left VM to call back.  KP

## 2023-03-31 ENCOUNTER — Other Ambulatory Visit: Payer: Self-pay | Admitting: Internal Medicine

## 2023-05-01 DIAGNOSIS — I25118 Atherosclerotic heart disease of native coronary artery with other forms of angina pectoris: Secondary | ICD-10-CM | POA: Diagnosis not present

## 2023-05-01 DIAGNOSIS — I959 Hypotension, unspecified: Secondary | ICD-10-CM | POA: Diagnosis not present

## 2023-05-01 DIAGNOSIS — I509 Heart failure, unspecified: Secondary | ICD-10-CM | POA: Diagnosis not present

## 2023-05-01 DIAGNOSIS — I081 Rheumatic disorders of both mitral and tricuspid valves: Secondary | ICD-10-CM | POA: Diagnosis not present

## 2023-05-01 DIAGNOSIS — I340 Nonrheumatic mitral (valve) insufficiency: Secondary | ICD-10-CM | POA: Diagnosis not present

## 2023-05-01 DIAGNOSIS — Z9181 History of falling: Secondary | ICD-10-CM | POA: Diagnosis not present

## 2023-05-01 DIAGNOSIS — I2510 Atherosclerotic heart disease of native coronary artery without angina pectoris: Secondary | ICD-10-CM | POA: Diagnosis not present

## 2023-05-01 DIAGNOSIS — Z7901 Long term (current) use of anticoagulants: Secondary | ICD-10-CM | POA: Diagnosis not present

## 2023-05-01 DIAGNOSIS — I4891 Unspecified atrial fibrillation: Secondary | ICD-10-CM | POA: Diagnosis not present

## 2023-05-01 DIAGNOSIS — N261 Atrophy of kidney (terminal): Secondary | ICD-10-CM | POA: Diagnosis not present

## 2023-05-01 DIAGNOSIS — I10 Essential (primary) hypertension: Secondary | ICD-10-CM | POA: Diagnosis not present

## 2023-05-01 DIAGNOSIS — Q6102 Congenital multiple renal cysts: Secondary | ICD-10-CM | POA: Diagnosis not present

## 2023-05-01 DIAGNOSIS — I5020 Unspecified systolic (congestive) heart failure: Secondary | ICD-10-CM | POA: Diagnosis not present

## 2023-05-01 DIAGNOSIS — W19XXXA Unspecified fall, initial encounter: Secondary | ICD-10-CM | POA: Diagnosis not present

## 2023-05-01 DIAGNOSIS — E785 Hyperlipidemia, unspecified: Secondary | ICD-10-CM | POA: Diagnosis not present

## 2023-05-01 DIAGNOSIS — I272 Pulmonary hypertension, unspecified: Secondary | ICD-10-CM | POA: Diagnosis not present

## 2023-05-01 DIAGNOSIS — M546 Pain in thoracic spine: Secondary | ICD-10-CM | POA: Diagnosis not present

## 2023-05-01 DIAGNOSIS — R600 Localized edema: Secondary | ICD-10-CM | POA: Diagnosis not present

## 2023-05-01 DIAGNOSIS — D72810 Lymphocytopenia: Secondary | ICD-10-CM | POA: Diagnosis not present

## 2023-05-01 DIAGNOSIS — H409 Unspecified glaucoma: Secondary | ICD-10-CM | POA: Diagnosis not present

## 2023-05-01 DIAGNOSIS — J9601 Acute respiratory failure with hypoxia: Secondary | ICD-10-CM | POA: Diagnosis not present

## 2023-05-01 DIAGNOSIS — I361 Nonrheumatic tricuspid (valve) insufficiency: Secondary | ICD-10-CM | POA: Diagnosis not present

## 2023-05-01 DIAGNOSIS — Z66 Do not resuscitate: Secondary | ICD-10-CM | POA: Diagnosis not present

## 2023-05-01 DIAGNOSIS — I5023 Acute on chronic systolic (congestive) heart failure: Secondary | ICD-10-CM | POA: Diagnosis not present

## 2023-05-01 DIAGNOSIS — Z853 Personal history of malignant neoplasm of breast: Secondary | ICD-10-CM | POA: Diagnosis not present

## 2023-05-01 DIAGNOSIS — I6522 Occlusion and stenosis of left carotid artery: Secondary | ICD-10-CM | POA: Diagnosis not present

## 2023-05-01 DIAGNOSIS — J810 Acute pulmonary edema: Secondary | ICD-10-CM | POA: Diagnosis not present

## 2023-05-01 DIAGNOSIS — S24114A Complete lesion at T11-T12 level of thoracic spinal cord, initial encounter: Secondary | ICD-10-CM | POA: Diagnosis not present

## 2023-05-01 DIAGNOSIS — I4821 Permanent atrial fibrillation: Secondary | ICD-10-CM | POA: Diagnosis not present

## 2023-05-01 DIAGNOSIS — D696 Thrombocytopenia, unspecified: Secondary | ICD-10-CM | POA: Diagnosis not present

## 2023-05-01 DIAGNOSIS — M50221 Other cervical disc displacement at C4-C5 level: Secondary | ICD-10-CM | POA: Diagnosis not present

## 2023-05-01 DIAGNOSIS — Z87891 Personal history of nicotine dependence: Secondary | ICD-10-CM | POA: Diagnosis not present

## 2023-05-01 DIAGNOSIS — M810 Age-related osteoporosis without current pathological fracture: Secondary | ICD-10-CM | POA: Diagnosis not present

## 2023-05-01 DIAGNOSIS — K5900 Constipation, unspecified: Secondary | ICD-10-CM | POA: Diagnosis not present

## 2023-05-01 DIAGNOSIS — I4819 Other persistent atrial fibrillation: Secondary | ICD-10-CM | POA: Diagnosis not present

## 2023-05-01 DIAGNOSIS — M47812 Spondylosis without myelopathy or radiculopathy, cervical region: Secondary | ICD-10-CM | POA: Diagnosis not present

## 2023-05-01 DIAGNOSIS — J90 Pleural effusion, not elsewhere classified: Secondary | ICD-10-CM | POA: Diagnosis not present

## 2023-05-01 DIAGNOSIS — C50811 Malignant neoplasm of overlapping sites of right female breast: Secondary | ICD-10-CM | POA: Diagnosis not present

## 2023-05-01 DIAGNOSIS — J849 Interstitial pulmonary disease, unspecified: Secondary | ICD-10-CM | POA: Diagnosis not present

## 2023-05-01 DIAGNOSIS — I255 Ischemic cardiomyopathy: Secondary | ICD-10-CM | POA: Diagnosis not present

## 2023-05-01 DIAGNOSIS — M542 Cervicalgia: Secondary | ICD-10-CM | POA: Diagnosis not present

## 2023-05-01 DIAGNOSIS — Z170 Estrogen receptor positive status [ER+]: Secondary | ICD-10-CM | POA: Diagnosis not present

## 2023-05-01 DIAGNOSIS — Z951 Presence of aortocoronary bypass graft: Secondary | ICD-10-CM | POA: Diagnosis not present

## 2023-05-01 DIAGNOSIS — Z1152 Encounter for screening for COVID-19: Secondary | ICD-10-CM | POA: Diagnosis not present

## 2023-05-01 DIAGNOSIS — M549 Dorsalgia, unspecified: Secondary | ICD-10-CM | POA: Diagnosis not present

## 2023-05-01 DIAGNOSIS — R0902 Hypoxemia: Secondary | ICD-10-CM | POA: Diagnosis not present

## 2023-05-06 ENCOUNTER — Ambulatory Visit: Payer: Medicare Other | Admitting: Internal Medicine

## 2023-05-10 ENCOUNTER — Telehealth: Payer: Self-pay | Admitting: *Deleted

## 2023-05-10 NOTE — Transitions of Care (Post Inpatient/ED Visit) (Signed)
   05/10/2023  Name: Donna Santos MRN: [id number] DOB: 02/24/1925  Today's TOC FU Call Status: Today's TOC FU Call Status:: Unsuccessul Call (1st Attempt) Unsuccessful Call (1st Attempt) Date: 05/10/23  Attempted to reach the patient regarding the most recent Inpatient/ED visit.  Follow Up Plan: Additional outreach attempts will be made to reach the patient to complete the Transitions of Care (Post Inpatient/ED visit) call.   Gean Maidens BSN RN [facility] [phone]

## 2023-05-11 ENCOUNTER — Telehealth: Payer: Self-pay | Admitting: *Deleted

## 2023-05-11 NOTE — Transitions of Care (Post Inpatient/ED Visit) (Signed)
   05/11/2023  Name: Donna Santos MRN: [id number] DOB: 07-08-1925  Today's TOC FU Call Status: Today's TOC FU Call Status:: Unsuccessful Call (2nd Attempt) Unsuccessful Call (2nd Attempt) Date: 05/11/23  Attempted to reach the patient regarding the most recent Inpatient/ED visit.  Follow Up Plan: Additional outreach attempts will be made to reach the patient to complete the Transitions of Care (Post Inpatient/ED visit) call.   Gean Maidens BSN RN [facility] [phone]

## 2023-05-14 DIAGNOSIS — N281 Cyst of kidney, acquired: Secondary | ICD-10-CM | POA: Diagnosis not present

## 2023-05-14 DIAGNOSIS — Z170 Estrogen receptor positive status [ER+]: Secondary | ICD-10-CM | POA: Diagnosis not present

## 2023-05-14 DIAGNOSIS — D649 Anemia, unspecified: Secondary | ICD-10-CM | POA: Diagnosis not present

## 2023-05-14 DIAGNOSIS — I4819 Other persistent atrial fibrillation: Secondary | ICD-10-CM | POA: Diagnosis not present

## 2023-05-14 DIAGNOSIS — I2510 Atherosclerotic heart disease of native coronary artery without angina pectoris: Secondary | ICD-10-CM | POA: Diagnosis not present

## 2023-05-14 DIAGNOSIS — E785 Hyperlipidemia, unspecified: Secondary | ICD-10-CM | POA: Diagnosis not present

## 2023-05-14 DIAGNOSIS — D696 Thrombocytopenia, unspecified: Secondary | ICD-10-CM | POA: Diagnosis not present

## 2023-05-14 DIAGNOSIS — I110 Hypertensive heart disease with heart failure: Secondary | ICD-10-CM | POA: Diagnosis not present

## 2023-05-14 DIAGNOSIS — I5020 Unspecified systolic (congestive) heart failure: Secondary | ICD-10-CM | POA: Insufficient documentation

## 2023-05-14 DIAGNOSIS — I081 Rheumatic disorders of both mitral and tricuspid valves: Secondary | ICD-10-CM | POA: Diagnosis not present

## 2023-05-14 DIAGNOSIS — J9601 Acute respiratory failure with hypoxia: Secondary | ICD-10-CM | POA: Diagnosis not present

## 2023-05-14 DIAGNOSIS — M810 Age-related osteoporosis without current pathological fracture: Secondary | ICD-10-CM | POA: Diagnosis not present

## 2023-05-14 DIAGNOSIS — Z951 Presence of aortocoronary bypass graft: Secondary | ICD-10-CM | POA: Diagnosis not present

## 2023-05-14 DIAGNOSIS — M11261 Other chondrocalcinosis, right knee: Secondary | ICD-10-CM | POA: Diagnosis not present

## 2023-05-14 DIAGNOSIS — Z7984 Long term (current) use of oral hypoglycemic drugs: Secondary | ICD-10-CM | POA: Diagnosis not present

## 2023-05-14 DIAGNOSIS — Z9181 History of falling: Secondary | ICD-10-CM | POA: Diagnosis not present

## 2023-05-14 DIAGNOSIS — C50811 Malignant neoplasm of overlapping sites of right female breast: Secondary | ICD-10-CM | POA: Diagnosis not present

## 2023-05-14 DIAGNOSIS — Z7901 Long term (current) use of anticoagulants: Secondary | ICD-10-CM | POA: Diagnosis not present

## 2023-05-16 DIAGNOSIS — M810 Age-related osteoporosis without current pathological fracture: Secondary | ICD-10-CM | POA: Diagnosis not present

## 2023-05-16 DIAGNOSIS — D696 Thrombocytopenia, unspecified: Secondary | ICD-10-CM | POA: Diagnosis not present

## 2023-05-16 DIAGNOSIS — J9601 Acute respiratory failure with hypoxia: Secondary | ICD-10-CM | POA: Diagnosis not present

## 2023-05-16 DIAGNOSIS — M11261 Other chondrocalcinosis, right knee: Secondary | ICD-10-CM | POA: Diagnosis not present

## 2023-05-16 DIAGNOSIS — N281 Cyst of kidney, acquired: Secondary | ICD-10-CM | POA: Diagnosis not present

## 2023-05-16 DIAGNOSIS — C50811 Malignant neoplasm of overlapping sites of right female breast: Secondary | ICD-10-CM | POA: Diagnosis not present

## 2023-05-16 DIAGNOSIS — Z9181 History of falling: Secondary | ICD-10-CM | POA: Diagnosis not present

## 2023-05-16 DIAGNOSIS — I4819 Other persistent atrial fibrillation: Secondary | ICD-10-CM | POA: Diagnosis not present

## 2023-05-16 DIAGNOSIS — I081 Rheumatic disorders of both mitral and tricuspid valves: Secondary | ICD-10-CM | POA: Diagnosis not present

## 2023-05-16 DIAGNOSIS — Z7984 Long term (current) use of oral hypoglycemic drugs: Secondary | ICD-10-CM | POA: Diagnosis not present

## 2023-05-16 DIAGNOSIS — D649 Anemia, unspecified: Secondary | ICD-10-CM | POA: Diagnosis not present

## 2023-05-16 DIAGNOSIS — E785 Hyperlipidemia, unspecified: Secondary | ICD-10-CM | POA: Diagnosis not present

## 2023-05-16 DIAGNOSIS — I110 Hypertensive heart disease with heart failure: Secondary | ICD-10-CM | POA: Diagnosis not present

## 2023-05-16 DIAGNOSIS — Z7901 Long term (current) use of anticoagulants: Secondary | ICD-10-CM | POA: Diagnosis not present

## 2023-05-16 DIAGNOSIS — Z951 Presence of aortocoronary bypass graft: Secondary | ICD-10-CM | POA: Diagnosis not present

## 2023-05-16 DIAGNOSIS — I2510 Atherosclerotic heart disease of native coronary artery without angina pectoris: Secondary | ICD-10-CM | POA: Diagnosis not present

## 2023-05-16 DIAGNOSIS — Z170 Estrogen receptor positive status [ER+]: Secondary | ICD-10-CM | POA: Diagnosis not present

## 2023-05-16 DIAGNOSIS — I5020 Unspecified systolic (congestive) heart failure: Secondary | ICD-10-CM | POA: Diagnosis not present

## 2023-05-17 DIAGNOSIS — C50811 Malignant neoplasm of overlapping sites of right female breast: Secondary | ICD-10-CM | POA: Diagnosis not present

## 2023-05-17 DIAGNOSIS — M11261 Other chondrocalcinosis, right knee: Secondary | ICD-10-CM | POA: Diagnosis not present

## 2023-05-17 DIAGNOSIS — Z9181 History of falling: Secondary | ICD-10-CM | POA: Diagnosis not present

## 2023-05-17 DIAGNOSIS — Z951 Presence of aortocoronary bypass graft: Secondary | ICD-10-CM | POA: Diagnosis not present

## 2023-05-17 DIAGNOSIS — Z7901 Long term (current) use of anticoagulants: Secondary | ICD-10-CM | POA: Diagnosis not present

## 2023-05-17 DIAGNOSIS — I110 Hypertensive heart disease with heart failure: Secondary | ICD-10-CM | POA: Diagnosis not present

## 2023-05-17 DIAGNOSIS — Z170 Estrogen receptor positive status [ER+]: Secondary | ICD-10-CM | POA: Diagnosis not present

## 2023-05-17 DIAGNOSIS — I4819 Other persistent atrial fibrillation: Secondary | ICD-10-CM | POA: Diagnosis not present

## 2023-05-17 DIAGNOSIS — I081 Rheumatic disorders of both mitral and tricuspid valves: Secondary | ICD-10-CM | POA: Diagnosis not present

## 2023-05-17 DIAGNOSIS — N281 Cyst of kidney, acquired: Secondary | ICD-10-CM | POA: Diagnosis not present

## 2023-05-17 DIAGNOSIS — I5020 Unspecified systolic (congestive) heart failure: Secondary | ICD-10-CM | POA: Diagnosis not present

## 2023-05-17 DIAGNOSIS — M810 Age-related osteoporosis without current pathological fracture: Secondary | ICD-10-CM | POA: Diagnosis not present

## 2023-05-17 DIAGNOSIS — D649 Anemia, unspecified: Secondary | ICD-10-CM | POA: Diagnosis not present

## 2023-05-17 DIAGNOSIS — I2510 Atherosclerotic heart disease of native coronary artery without angina pectoris: Secondary | ICD-10-CM | POA: Diagnosis not present

## 2023-05-17 DIAGNOSIS — E785 Hyperlipidemia, unspecified: Secondary | ICD-10-CM | POA: Diagnosis not present

## 2023-05-17 DIAGNOSIS — D696 Thrombocytopenia, unspecified: Secondary | ICD-10-CM | POA: Diagnosis not present

## 2023-05-17 DIAGNOSIS — Z7984 Long term (current) use of oral hypoglycemic drugs: Secondary | ICD-10-CM | POA: Diagnosis not present

## 2023-05-17 DIAGNOSIS — J9601 Acute respiratory failure with hypoxia: Secondary | ICD-10-CM | POA: Diagnosis not present

## 2023-05-24 DIAGNOSIS — Z9181 History of falling: Secondary | ICD-10-CM | POA: Diagnosis not present

## 2023-05-24 DIAGNOSIS — M11261 Other chondrocalcinosis, right knee: Secondary | ICD-10-CM | POA: Diagnosis not present

## 2023-05-24 DIAGNOSIS — D649 Anemia, unspecified: Secondary | ICD-10-CM | POA: Diagnosis not present

## 2023-05-24 DIAGNOSIS — Z7901 Long term (current) use of anticoagulants: Secondary | ICD-10-CM | POA: Diagnosis not present

## 2023-05-24 DIAGNOSIS — C50811 Malignant neoplasm of overlapping sites of right female breast: Secondary | ICD-10-CM | POA: Diagnosis not present

## 2023-05-24 DIAGNOSIS — D696 Thrombocytopenia, unspecified: Secondary | ICD-10-CM | POA: Diagnosis not present

## 2023-05-24 DIAGNOSIS — Z951 Presence of aortocoronary bypass graft: Secondary | ICD-10-CM | POA: Diagnosis not present

## 2023-05-24 DIAGNOSIS — Z7984 Long term (current) use of oral hypoglycemic drugs: Secondary | ICD-10-CM | POA: Diagnosis not present

## 2023-05-24 DIAGNOSIS — M810 Age-related osteoporosis without current pathological fracture: Secondary | ICD-10-CM | POA: Diagnosis not present

## 2023-05-24 DIAGNOSIS — E785 Hyperlipidemia, unspecified: Secondary | ICD-10-CM | POA: Diagnosis not present

## 2023-05-24 DIAGNOSIS — I081 Rheumatic disorders of both mitral and tricuspid valves: Secondary | ICD-10-CM | POA: Diagnosis not present

## 2023-05-24 DIAGNOSIS — Z170 Estrogen receptor positive status [ER+]: Secondary | ICD-10-CM | POA: Diagnosis not present

## 2023-05-24 DIAGNOSIS — J9601 Acute respiratory failure with hypoxia: Secondary | ICD-10-CM | POA: Diagnosis not present

## 2023-05-24 DIAGNOSIS — N281 Cyst of kidney, acquired: Secondary | ICD-10-CM | POA: Diagnosis not present

## 2023-05-24 DIAGNOSIS — I2510 Atherosclerotic heart disease of native coronary artery without angina pectoris: Secondary | ICD-10-CM | POA: Diagnosis not present

## 2023-05-24 DIAGNOSIS — I4819 Other persistent atrial fibrillation: Secondary | ICD-10-CM | POA: Diagnosis not present

## 2023-05-24 DIAGNOSIS — I110 Hypertensive heart disease with heart failure: Secondary | ICD-10-CM | POA: Diagnosis not present

## 2023-05-24 DIAGNOSIS — I5020 Unspecified systolic (congestive) heart failure: Secondary | ICD-10-CM | POA: Diagnosis not present

## 2023-05-26 DIAGNOSIS — C50811 Malignant neoplasm of overlapping sites of right female breast: Secondary | ICD-10-CM | POA: Diagnosis not present

## 2023-05-26 DIAGNOSIS — N281 Cyst of kidney, acquired: Secondary | ICD-10-CM | POA: Diagnosis not present

## 2023-05-26 DIAGNOSIS — I5020 Unspecified systolic (congestive) heart failure: Secondary | ICD-10-CM | POA: Diagnosis not present

## 2023-05-26 DIAGNOSIS — I4819 Other persistent atrial fibrillation: Secondary | ICD-10-CM | POA: Diagnosis not present

## 2023-05-26 DIAGNOSIS — Z170 Estrogen receptor positive status [ER+]: Secondary | ICD-10-CM | POA: Diagnosis not present

## 2023-05-26 DIAGNOSIS — D649 Anemia, unspecified: Secondary | ICD-10-CM | POA: Diagnosis not present

## 2023-05-26 DIAGNOSIS — I110 Hypertensive heart disease with heart failure: Secondary | ICD-10-CM | POA: Diagnosis not present

## 2023-05-26 DIAGNOSIS — I081 Rheumatic disorders of both mitral and tricuspid valves: Secondary | ICD-10-CM | POA: Diagnosis not present

## 2023-05-26 DIAGNOSIS — D696 Thrombocytopenia, unspecified: Secondary | ICD-10-CM | POA: Diagnosis not present

## 2023-05-26 DIAGNOSIS — J9601 Acute respiratory failure with hypoxia: Secondary | ICD-10-CM | POA: Diagnosis not present

## 2023-05-26 DIAGNOSIS — Z9181 History of falling: Secondary | ICD-10-CM | POA: Diagnosis not present

## 2023-05-26 DIAGNOSIS — M810 Age-related osteoporosis without current pathological fracture: Secondary | ICD-10-CM | POA: Diagnosis not present

## 2023-05-26 DIAGNOSIS — M11261 Other chondrocalcinosis, right knee: Secondary | ICD-10-CM | POA: Diagnosis not present

## 2023-05-26 DIAGNOSIS — E785 Hyperlipidemia, unspecified: Secondary | ICD-10-CM | POA: Diagnosis not present

## 2023-05-26 DIAGNOSIS — Z7984 Long term (current) use of oral hypoglycemic drugs: Secondary | ICD-10-CM | POA: Diagnosis not present

## 2023-05-26 DIAGNOSIS — I2510 Atherosclerotic heart disease of native coronary artery without angina pectoris: Secondary | ICD-10-CM | POA: Diagnosis not present

## 2023-05-26 DIAGNOSIS — Z951 Presence of aortocoronary bypass graft: Secondary | ICD-10-CM | POA: Diagnosis not present

## 2023-05-26 DIAGNOSIS — Z7901 Long term (current) use of anticoagulants: Secondary | ICD-10-CM | POA: Diagnosis not present

## 2023-05-27 DIAGNOSIS — I5020 Unspecified systolic (congestive) heart failure: Secondary | ICD-10-CM | POA: Diagnosis not present

## 2023-05-27 DIAGNOSIS — Z951 Presence of aortocoronary bypass graft: Secondary | ICD-10-CM | POA: Diagnosis not present

## 2023-05-27 DIAGNOSIS — D696 Thrombocytopenia, unspecified: Secondary | ICD-10-CM | POA: Diagnosis not present

## 2023-05-27 DIAGNOSIS — M11261 Other chondrocalcinosis, right knee: Secondary | ICD-10-CM | POA: Diagnosis not present

## 2023-05-27 DIAGNOSIS — N281 Cyst of kidney, acquired: Secondary | ICD-10-CM | POA: Diagnosis not present

## 2023-05-27 DIAGNOSIS — D649 Anemia, unspecified: Secondary | ICD-10-CM | POA: Diagnosis not present

## 2023-05-27 DIAGNOSIS — Z7901 Long term (current) use of anticoagulants: Secondary | ICD-10-CM | POA: Diagnosis not present

## 2023-05-27 DIAGNOSIS — J9601 Acute respiratory failure with hypoxia: Secondary | ICD-10-CM | POA: Diagnosis not present

## 2023-05-27 DIAGNOSIS — Z7984 Long term (current) use of oral hypoglycemic drugs: Secondary | ICD-10-CM | POA: Diagnosis not present

## 2023-05-27 DIAGNOSIS — Z170 Estrogen receptor positive status [ER+]: Secondary | ICD-10-CM | POA: Diagnosis not present

## 2023-05-27 DIAGNOSIS — I081 Rheumatic disorders of both mitral and tricuspid valves: Secondary | ICD-10-CM | POA: Diagnosis not present

## 2023-05-27 DIAGNOSIS — E785 Hyperlipidemia, unspecified: Secondary | ICD-10-CM | POA: Diagnosis not present

## 2023-05-27 DIAGNOSIS — I4819 Other persistent atrial fibrillation: Secondary | ICD-10-CM | POA: Diagnosis not present

## 2023-05-27 DIAGNOSIS — I110 Hypertensive heart disease with heart failure: Secondary | ICD-10-CM | POA: Diagnosis not present

## 2023-05-27 DIAGNOSIS — Z9181 History of falling: Secondary | ICD-10-CM | POA: Diagnosis not present

## 2023-05-27 DIAGNOSIS — M810 Age-related osteoporosis without current pathological fracture: Secondary | ICD-10-CM | POA: Diagnosis not present

## 2023-05-27 DIAGNOSIS — I2510 Atherosclerotic heart disease of native coronary artery without angina pectoris: Secondary | ICD-10-CM | POA: Diagnosis not present

## 2023-05-27 DIAGNOSIS — C50811 Malignant neoplasm of overlapping sites of right female breast: Secondary | ICD-10-CM | POA: Diagnosis not present

## 2023-05-30 DIAGNOSIS — E785 Hyperlipidemia, unspecified: Secondary | ICD-10-CM | POA: Diagnosis not present

## 2023-05-30 DIAGNOSIS — I4819 Other persistent atrial fibrillation: Secondary | ICD-10-CM | POA: Diagnosis not present

## 2023-05-30 DIAGNOSIS — J9601 Acute respiratory failure with hypoxia: Secondary | ICD-10-CM | POA: Diagnosis not present

## 2023-05-30 DIAGNOSIS — M810 Age-related osteoporosis without current pathological fracture: Secondary | ICD-10-CM | POA: Diagnosis not present

## 2023-05-30 DIAGNOSIS — I5020 Unspecified systolic (congestive) heart failure: Secondary | ICD-10-CM | POA: Diagnosis not present

## 2023-05-30 DIAGNOSIS — I081 Rheumatic disorders of both mitral and tricuspid valves: Secondary | ICD-10-CM | POA: Diagnosis not present

## 2023-05-30 DIAGNOSIS — M11261 Other chondrocalcinosis, right knee: Secondary | ICD-10-CM | POA: Diagnosis not present

## 2023-05-30 DIAGNOSIS — Z951 Presence of aortocoronary bypass graft: Secondary | ICD-10-CM | POA: Diagnosis not present

## 2023-05-30 DIAGNOSIS — I110 Hypertensive heart disease with heart failure: Secondary | ICD-10-CM | POA: Diagnosis not present

## 2023-05-30 DIAGNOSIS — Z9181 History of falling: Secondary | ICD-10-CM | POA: Diagnosis not present

## 2023-05-30 DIAGNOSIS — I2510 Atherosclerotic heart disease of native coronary artery without angina pectoris: Secondary | ICD-10-CM | POA: Diagnosis not present

## 2023-05-30 DIAGNOSIS — D696 Thrombocytopenia, unspecified: Secondary | ICD-10-CM | POA: Diagnosis not present

## 2023-05-30 DIAGNOSIS — Z170 Estrogen receptor positive status [ER+]: Secondary | ICD-10-CM | POA: Diagnosis not present

## 2023-05-30 DIAGNOSIS — Z7901 Long term (current) use of anticoagulants: Secondary | ICD-10-CM | POA: Diagnosis not present

## 2023-05-30 DIAGNOSIS — Z7984 Long term (current) use of oral hypoglycemic drugs: Secondary | ICD-10-CM | POA: Diagnosis not present

## 2023-05-30 DIAGNOSIS — C50811 Malignant neoplasm of overlapping sites of right female breast: Secondary | ICD-10-CM | POA: Diagnosis not present

## 2023-05-30 DIAGNOSIS — N281 Cyst of kidney, acquired: Secondary | ICD-10-CM | POA: Diagnosis not present

## 2023-05-30 DIAGNOSIS — D649 Anemia, unspecified: Secondary | ICD-10-CM | POA: Diagnosis not present

## 2023-06-02 DIAGNOSIS — Z7984 Long term (current) use of oral hypoglycemic drugs: Secondary | ICD-10-CM | POA: Diagnosis not present

## 2023-06-02 DIAGNOSIS — Z7901 Long term (current) use of anticoagulants: Secondary | ICD-10-CM | POA: Diagnosis not present

## 2023-06-02 DIAGNOSIS — E785 Hyperlipidemia, unspecified: Secondary | ICD-10-CM | POA: Diagnosis not present

## 2023-06-02 DIAGNOSIS — D696 Thrombocytopenia, unspecified: Secondary | ICD-10-CM | POA: Diagnosis not present

## 2023-06-02 DIAGNOSIS — Z951 Presence of aortocoronary bypass graft: Secondary | ICD-10-CM | POA: Diagnosis not present

## 2023-06-02 DIAGNOSIS — D649 Anemia, unspecified: Secondary | ICD-10-CM | POA: Diagnosis not present

## 2023-06-02 DIAGNOSIS — M11261 Other chondrocalcinosis, right knee: Secondary | ICD-10-CM | POA: Diagnosis not present

## 2023-06-02 DIAGNOSIS — I5020 Unspecified systolic (congestive) heart failure: Secondary | ICD-10-CM | POA: Diagnosis not present

## 2023-06-02 DIAGNOSIS — J9601 Acute respiratory failure with hypoxia: Secondary | ICD-10-CM | POA: Diagnosis not present

## 2023-06-02 DIAGNOSIS — I110 Hypertensive heart disease with heart failure: Secondary | ICD-10-CM | POA: Diagnosis not present

## 2023-06-02 DIAGNOSIS — I4819 Other persistent atrial fibrillation: Secondary | ICD-10-CM | POA: Diagnosis not present

## 2023-06-02 DIAGNOSIS — N281 Cyst of kidney, acquired: Secondary | ICD-10-CM | POA: Diagnosis not present

## 2023-06-02 DIAGNOSIS — Z170 Estrogen receptor positive status [ER+]: Secondary | ICD-10-CM | POA: Diagnosis not present

## 2023-06-02 DIAGNOSIS — I2510 Atherosclerotic heart disease of native coronary artery without angina pectoris: Secondary | ICD-10-CM | POA: Diagnosis not present

## 2023-06-02 DIAGNOSIS — M810 Age-related osteoporosis without current pathological fracture: Secondary | ICD-10-CM | POA: Diagnosis not present

## 2023-06-02 DIAGNOSIS — Z9181 History of falling: Secondary | ICD-10-CM | POA: Diagnosis not present

## 2023-06-02 DIAGNOSIS — I081 Rheumatic disorders of both mitral and tricuspid valves: Secondary | ICD-10-CM | POA: Diagnosis not present

## 2023-06-02 DIAGNOSIS — C50811 Malignant neoplasm of overlapping sites of right female breast: Secondary | ICD-10-CM | POA: Diagnosis not present

## 2023-06-06 ENCOUNTER — Ambulatory Visit (INDEPENDENT_AMBULATORY_CARE_PROVIDER_SITE_OTHER): Payer: Medicare Other | Admitting: Internal Medicine

## 2023-06-06 ENCOUNTER — Ambulatory Visit (INDEPENDENT_AMBULATORY_CARE_PROVIDER_SITE_OTHER): Payer: Medicare Other

## 2023-06-06 ENCOUNTER — Encounter: Payer: Self-pay | Admitting: Internal Medicine

## 2023-06-06 VITALS — BP 128/76 | HR 76 | Ht 60.0 in | Wt 108.0 lb

## 2023-06-06 VITALS — BP 128/76 | HR 80 | Ht 60.0 in | Wt 108.0 lb

## 2023-06-06 DIAGNOSIS — I10 Essential (primary) hypertension: Secondary | ICD-10-CM

## 2023-06-06 DIAGNOSIS — I5020 Unspecified systolic (congestive) heart failure: Secondary | ICD-10-CM | POA: Diagnosis not present

## 2023-06-06 DIAGNOSIS — M11261 Other chondrocalcinosis, right knee: Secondary | ICD-10-CM | POA: Diagnosis not present

## 2023-06-06 DIAGNOSIS — Z0000 Encounter for general adult medical examination without abnormal findings: Secondary | ICD-10-CM | POA: Diagnosis not present

## 2023-06-06 DIAGNOSIS — I4819 Other persistent atrial fibrillation: Secondary | ICD-10-CM | POA: Diagnosis not present

## 2023-06-06 DIAGNOSIS — N289 Disorder of kidney and ureter, unspecified: Secondary | ICD-10-CM | POA: Diagnosis not present

## 2023-06-06 NOTE — Assessment & Plan Note (Signed)
Followed by Cardiology. On beta blocker and Eliquis. No bleeding issues noted.

## 2023-06-06 NOTE — Assessment & Plan Note (Addendum)
Started on Jardiance, Spironolactone, Lasix and Losartan. Appears clinically slightly dry Will get CMP and advise Follow up with Cardiology as planned this week

## 2023-06-06 NOTE — Patient Instructions (Signed)
  Donna Santos , Thank you for taking time to come for your Medicare Wellness Visit. I appreciate your ongoing commitment to your health goals. Please review the following plan we discussed and let me know if I can assist you in the future.   These are the goals we discussed:  Goals      Increase water intake     Recommend to increase fluid intake from 2 glasses of water per day to 4 glasses per day     Patient Stated     Patient stated she would like to continue to remain active.     Prevent Falls     Patient carries a cane when leaving the house.         This is a list of the screening recommended for you and due dates:  Health Maintenance  Topic Date Due   COVID-19 Vaccine (1) Never done   Zoster (Shingles) Vaccine (1 of 2) Never done   Mammogram  08/19/2022   Pneumonia Vaccine (2 of 2 - PCV) 11/04/2023*   Flu Shot  06/30/2023   Medicare Annual Wellness Visit  06/05/2024   DTaP/Tdap/Td vaccine (2 - Td or Tdap) 08/09/2026   DEXA scan (bone density measurement)  Completed   HPV Vaccine  Aged Out  *Topic was postponed. The date shown is not the original due date.

## 2023-06-06 NOTE — Progress Notes (Signed)
Subjective:   Donna Santos is a 89 y.o. female who presents for Medicare Annual (Subsequent) preventive examination.  Visit Complete: In person  Review of Systems    Defer to PCP  Cardiac Risk Factors include: advanced age (>34men, >58 women)     Objective:    Today's Vitals   06/06/23 0952 06/06/23 0953  BP: 128/76   Pulse: 80   Weight: 108 lb (49 kg)   Height: 5' (1.524 m)   PainSc:  0-No pain   Body mass index is 21.09 kg/m.     06/06/2023    9:56 AM 01/06/2022    3:32 PM 08/26/2021    1:22 PM 06/16/2020   11:00 AM 12/17/2019    2:04 PM 06/18/2019    1:04 PM 05/28/2019    1:38 PM  Advanced Directives  Does Patient Have a Medical Advance Directive? No No Yes No Yes Yes Yes  Type of [employer]ist of [employer] Power of [place];Out of facility DNR (pink MOST or yellow form) Healthcare Power of [place];Out of facility DNR (pink MOST or yellow form)  Does patient want to make changes to medical advance directive?   No - Patient declined      Copy of Healthcare Power of Attorney in Chart?     No - copy requested  No - copy requested  Would patient like information on creating a medical advance directive? No - Patient declined No - Patient declined  No - Patient declined No - Patient declined      Current Medications (verified) Outpatient Encounter Medications as of 06/06/2023  Medication Sig   acetaminophen (TYLENOL) 500 MG tablet Take 500 mg by mouth every 6 (six) hours as needed.   apixaban (ELIQUIS) 2.5 MG TABS tablet TAKE 1 TABLET(2.5 MG) BY MOUTH TWICE DAILY   Calcium Carb-Cholecalciferol 600-800 MG-UNIT TABS Take 1 tablet by mouth daily. Reported on 12/25/2015   Cholecalciferol (VITAMIN D-3) 125 MCG (5000 UT) TABS Take 1 tablet by mouth daily.   Cranberry-Vitamin C-Probiotic (AZO CRANBERRY PO) Take by mouth in the morning and at bedtime.   empagliflozin (JARDIANCE) 10 MG TABS tablet Take 1 tablet by mouth  daily.   furosemide (LASIX) 20 MG tablet Take 1 tablet by mouth daily.   latanoprost (XALATAN) 0.005 % ophthalmic solution INT 1 GTT INTO EACH EYE QHS.   metoprolol succinate (TOPROL-XL) 50 MG 24 hr tablet TAKE 1 TABLET BY MOUTH EVERY NIGHT (Patient taking differently: Take 50 mg by mouth daily.)   nitroGLYCERIN (NITROSTAT) 0.4 MG SL tablet Place 0.4 mg under the tongue every 5 (five) minutes as needed for chest pain.   spironolactone (ALDACTONE) 25 MG tablet Take 1 tablet by mouth daily.   timolol (TIMOPTIC) 0.25 % ophthalmic solution INSTILL ONE DROP INTO BOTH EYES EVERY MORNING.   TURMERIC PO Take 1 tablet by mouth daily.   No facility-administered encounter medications on file as of 06/06/2023.    Allergies (verified) Ace inhibitors and Brinzolamide-brimonidine   History: Past Medical History:  Diagnosis Date   Arteriosclerosis of coronary artery    Arthritis of shoulder region    Breast cancer (HCC) 2002   RT MASTECTOMY   Cataract    Essential hypertension    Glaucoma    Hypercholesteremia    Impaired renal function    Muscle spasms of neck    Tobacco abuse    Past Surgical History:  Procedure Laterality Date   ARTHROTOMY KNEE-INFEC  W/EXPLOR/DRAIN/REMOV FB Right 02/2022   CAROTID ENDARTERECTOMY     CORONARY ARTERY BYPASS GRAFT  2006   MASS EXCISION Right 08/28/2015   Procedure: EXCISION MASS; remove massive right chest wall;  Surgeon: Lattie Haw, MD;  Location: [facility] ORS;  Service: General;  Laterality: Right;   MASTECTOMY, RADICAL Right 2002   BREAST CA   TOTAL ABDOMINAL HYSTERECTOMY     Family History  Problem Relation Age of Onset   Hypertension Father    Cancer Father    Breast cancer Mother    Social History   Socioeconomic History   Marital status: Widowed    Spouse name: Not on file   Number of children: 6   Years of education: Not on file   Highest education level: Not on file  Occupational History   Not on file  Tobacco Use   Smoking status:  Former    Packs/day: 1.50    Years: 1.50    Additional pack years: 0.00    Total pack years: 2.25    Types: Cigarettes    Quit date: 10/04/1952    Years since quitting: 70.7   Smokeless tobacco: Never  Vaping Use   Vaping Use: Never used  Substance and Sexual Activity   Alcohol use: No    Alcohol/week: 0.0 standard drinks of alcohol   Drug use: No   Sexual activity: Not on file  Other Topics Concern   Not on file  Social History Narrative   Not on file   Social Determinants of Health   Financial Resource Strain: Low Risk  (06/06/2023)   Overall Financial Resource Strain (CARDIA)    Difficulty of Paying Living Expenses: Not hard at all  Food Insecurity: No Food Insecurity (06/06/2023)   Hunger Vital Sign    Worried About Running Out of Food in the Last Year: Never true    Ran Out of Food in the Last Year: Never true  Transportation Needs: No Transportation Needs (06/06/2023)   PRAPARE - Administrator, Civil Service (Medical): No    Lack of Transportation (Non-Medical): No  Physical Activity: Inactive (06/06/2023)   Exercise Vital Sign    Days of Exercise per Week: 0 days    Minutes of Exercise per Session: 0 min  Stress: No Stress Concern Present (06/06/2023)   [employer] of Occupational Health - Occupational Stress Questionnaire    Feeling of Stress : Not at all  Social Connections: Moderately Isolated (06/06/2023)   Social Connection and Isolation Panel [NHANES]    Frequency of Communication with Friends and Family: Three times a week    Frequency of Social Gatherings with Friends and Family: Three times a week    Attends Religious Services: More than 4 times per year    Active Member of Clubs or Organizations: No    Attends Banker Meetings: Never    Marital Status: Widowed    Tobacco Counseling Counseling given: Not Answered   Clinical Intake:  Pre-visit preparation completed: Yes  Pain : No/denies pain Pain Score: 0-No pain      BMI - recorded: 21.09 Nutritional Status: BMI of 19-24  Normal Nutritional Risks: None Diabetes: No  How often do you need to have someone help you when you read instructions, pamphlets, or other written materials from your doctor or pharmacy?: 1 - Never  Interpreter Needed?: No  Information entered by :: Margaretha Sheffield, CMA   Activities of Daily Living    06/06/2023    9:57  AM  In your present state of health, do you have any difficulty performing the following activities:  Hearing? 0  Vision? 0  Difficulty concentrating or making decisions? 1  Walking or climbing stairs? 1  Dressing or bathing? 0  Doing errands, shopping? 1  Preparing Food and eating ? N  Using the Toilet? N  In the past six months, have you accidently leaked urine? N  Do you have problems with loss of bowel control? N  Managing your Medications? Y  Managing your Finances? Y  Housekeeping or managing your Housekeeping? N    Patient Care Team: Reubin Milan, MD as PCP - General (Internal Medicine) Carmina Miller, MD as Referring Physician (Radiation Oncology) Rosey Bath, MD (Inactive) as Referring Physician (Hematology and Oncology)  Indicate any recent Medical Services you may have received from other than [facility] providers in the past year (date may be approximate).     Assessment:   This is a routine wellness examination for Donna Santos.  Hearing/Vision screen Hearing Screening - Comments:: No concerns at this time. Vision Screening - Comments:: Patient wears glasses and does yearly exams with her eye doctor in [place].  Dietary issues and exercise activities discussed:     Goals Addressed             This Visit's Progress    Patient Stated       Patient stated she would like to continue to remain active.     Prevent Falls   On track    Patient carries a cane when leaving the house.       Depression Screen    06/06/2023    9:50 AM 11/03/2022    3:14 PM 09/03/2022    4:01  PM 08/17/2022    2:11 PM 07/12/2022    4:32 PM 04/30/2022    1:31 PM 01/06/2022    3:31 PM  PHQ 2/9 Scores  PHQ - 2 Score 0 0 0 0 0 1 0  PHQ- 9 Score 3 2 1 5 5 4      Fall Risk    06/06/2023    9:50 AM 11/03/2022    3:15 PM 09/03/2022    4:02 PM 08/17/2022    2:11 PM 07/12/2022    4:32 PM  Fall Risk   Falls in the past year? 1 1 0 0 1  Number falls in past yr: 0 1 0 1 1  Injury with Fall? 0 1 0 1 1  Risk for fall due to : History of fall(s);Impaired balance/gait History of fall(s) No Fall Risks History of fall(s) No Fall Risks  Follow up Falls evaluation completed Falls evaluation completed Falls evaluation completed Falls evaluation completed Falls evaluation completed    MEDICARE RISK AT HOME:  Medicare Risk at Home - 06/06/23 0958     Any stairs in or around the home? No    If so, are there any without handrails? No   N/A   Home free of loose throw rugs in walkways, pet beds, electrical cords, etc? Yes    Adequate lighting in your home to reduce risk of falls? Yes    Life alert? Yes    Use of a cane, walker or w/c? Yes    Grab bars in the bathroom? Yes    Shower chair or bench in shower? Yes    Elevated toilet seat or a handicapped toilet? Yes             TIMED UP AND GO:  Was the test performed?  Yes  Length of time to ambulate 10 feet: 10 sec Gait unsteady with use of assistive device, provider informed and education provided.     Cognitive Function:    08/03/2016   10:51 AM  MMSE - Mini Mental State Exam  Orientation to time 5  Orientation to Place 5  Registration 3  Attention/ Calculation 5  Recall 2  Language- name 2 objects 2        06/06/2023    9:57 AM 08/26/2020   11:21 AM 07/09/2020    1:38 PM 05/28/2019    1:42 PM  6CIT Screen  What Year? 4 points 0 points 4 points 0 points  What month? 3 points 3 points 3 points 3 points  What time? 0 points 3 points 3 points 0 points  Count back from 20 0 points 0 points 0 points 0 points  Months in reverse 4  points 2 points 4 points 0 points  Repeat phrase 10 points 8 points 10 points 8 points  Total Score 21 points 16 points 24 points 11 points    Immunizations Immunization History  Administered Date(s) Administered   Pneumococcal Polysaccharide-23 11/30/2004   Tdap 08/09/2016    TDAP status: Up to date  Flu Vaccine status: Declined, Education has been provided regarding the importance of this vaccine but patient still declined. Advised may receive this vaccine at local pharmacy or Health Dept. Aware to provide a copy of the vaccination record if obtained from local pharmacy or Health Dept. Verbalized acceptance and understanding.  Pneumococcal vaccine status: Declined,  Education has been provided regarding the importance of this vaccine but patient still declined. Advised may receive this vaccine at local pharmacy or Health Dept. Aware to provide a copy of the vaccination record if obtained from local pharmacy or Health Dept. Verbalized acceptance and understanding.   Covid-19 vaccine status: Declined, Education has been provided regarding the importance of this vaccine but patient still declined. Advised may receive this vaccine at local pharmacy or Health Dept.or vaccine clinic. Aware to provide a copy of the vaccination record if obtained from local pharmacy or Health Dept. Verbalized acceptance and understanding.  Qualifies for Shingles Vaccine? Yes   Zostavax completed No   Shingrix Completed?: No.    Education has been provided regarding the importance of this vaccine. Patient has been advised to call insurance company to determine out of pocket expense if they have not yet received this vaccine. Advised may also receive vaccine at local pharmacy or Health Dept. Verbalized acceptance and understanding.  Screening Tests Health Maintenance  Topic Date Due   COVID-19 Vaccine (1) Never done   Zoster Vaccines- Shingrix (1 of 2) Never done   MAMMOGRAM  08/19/2022   Pneumonia Vaccine 60+  Years old (2 of 2 - PCV) 11/04/2023 (Originally 11/30/2005)   INFLUENZA VACCINE  06/30/2023   Medicare Annual Wellness (AWV)  06/05/2024   DTaP/Tdap/Td (2 - Td or Tdap) 08/09/2026   DEXA SCAN  Completed   HPV VACCINES  Aged Out    Health Maintenance  Health Maintenance Due  Topic Date Due   COVID-19 Vaccine (1) Never done   Zoster Vaccines- Shingrix (1 of 2) Never done   MAMMOGRAM  08/19/2022    Colorectal cancer screening: No longer required.   Mammogram status: No longer required due to age.  Bone Density status: Completed 08/19/2021. Results reflect: Bone density results: OSTEOPOROSIS. Repeat every 2 years.  Lung Cancer Screening: (Low Dose CT Chest  recommended if Age 52-80 years, 20 pack-year currently smoking OR have quit w/in 15years.) does not qualify.   Lung Cancer Screening Referral: N/A  Additional Screening:  Hepatitis C Screening: does not qualify  Vision Screening: Recommended annual ophthalmology exams for early detection of glaucoma and other disorders of the eye. Is the patient up to date with their annual eye exam?  Yes  Who is the provider or what is the name of the office in which the patient attends annual eye exams? [facility]  Dental Screening: Recommended annual dental exams for proper oral hygiene  Community Resource Referral / Chronic Care Management: CRR required this visit?  No   CCM required this visit?  No     Plan:     I have personally reviewed and noted the following in the patient's chart:   Medical and social history Use of alcohol, tobacco or illicit drugs  Current medications and supplements including opioid prescriptions. Patient is not currently taking opioid prescriptions. Functional ability and status Nutritional status Physical activity Advanced directives List of other physicians Hospitalizations, surgeries, and ER visits in previous 12 months Vitals Screenings to include cognitive, depression, and falls Referrals  and appointments  In addition, I have reviewed and discussed with patient certain preventive protocols, quality metrics, and best practice recommendations. A written personalized care plan for preventive services as well as general preventive health recommendations were provided to patient.     Mariel Sleet, CMA   06/06/2023   After Visit Summary: Printed in office.  Nurse Notes: None.

## 2023-06-06 NOTE — Assessment & Plan Note (Signed)
Long standing decrease in GFR Most recent GFR 57

## 2023-06-06 NOTE — Assessment & Plan Note (Signed)
Recurrent joint pain treated with prn Colchicine.

## 2023-06-06 NOTE — Assessment & Plan Note (Addendum)
Stable exam with well controlled BP.  Currently taking Losartan, spironolactone, lasix and metoprolol. Recent diagnosis of CHF with EF 40%. Tolerating medications without concerns or side effects. Will continue to recommend low sodium diet and current regimen.

## 2023-06-06 NOTE — Progress Notes (Signed)
Date:  06/06/2023   Name:  Donna Santos   DOB:  [birth year]   MRN:  [id number]   Chief Complaint: Hypertension  Hypertension This is a chronic problem. The problem is controlled. Pertinent negatives include no chest pain, headaches, palpitations or shortness of breath.  Knee Pain  There was no injury mechanism. The pain is present in the right knee. The quality of the pain is described as aching. The pain is moderate. The pain has been Fluctuating since onset.  Afib - rate controlled.  No worsening chest pain or shortness of breath.  Anticoagulated with Eliquis. CHF - had a fall in June and was hospitalized with CHF.  No fractures seen, medications adjusted and she was discharged home.  Since then her weight is down 10 lbs.  She feels well - no chest pain or change in shortness of breath.  No side effects to new medications - Jardiance, spironolactone and lasix.  Amlodipine stopped and losartan 25 mg restarted.  She has cardiology appointment this week. Lab Results  Component Value Date   NA 137 11/03/2022   K 4.2 11/03/2022   CO2 22 11/03/2022   GLUCOSE 98 11/03/2022   BUN 17 11/03/2022   CREATININE 0.92 11/03/2022   CALCIUM 10.1 11/03/2022   EGFR 57 (L) 11/03/2022   GFRNONAA >60 06/17/2021   Lab Results  Component Value Date   CHOL 261 (A) 07/24/2013   HDL 44 07/24/2013   LDLCALC 179 07/24/2013   TRIG 192 (A) 07/24/2013   Lab Results  Component Value Date   TSH 2.930 11/03/2022   No results found for: "HGBA1C" Lab Results  Component Value Date   WBC 6.1 09/03/2022   HGB 11.9 09/03/2022   HCT 36.4 09/03/2022   MCV 94 09/03/2022   PLT 297 09/03/2022   Lab Results  Component Value Date   ALT 14 06/17/2021   AST 16 06/17/2021   ALKPHOS 38 06/17/2021   BILITOT 0.6 06/17/2021   Lab Results  Component Value Date   VD25OH 46.0 05/02/2019     Review of Systems  Constitutional:  Positive for unexpected weight change. Negative for appetite change and fatigue.   HENT:  Negative for nosebleeds and trouble swallowing.   Eyes:  Negative for visual disturbance.  Respiratory:  Negative for cough, chest tightness, shortness of breath and wheezing.   Cardiovascular:  Negative for chest pain, palpitations and leg swelling.  Gastrointestinal:  Negative for abdominal pain, constipation and diarrhea.  Musculoskeletal:  Positive for gait problem (uses walker).  Skin:  Negative for wound.  Neurological:  Negative for dizziness, weakness, light-headedness and headaches.  Psychiatric/Behavioral:  Negative for dysphoric mood and sleep disturbance. The patient is not nervous/anxious.     Patient Active Problem List   Diagnosis Date Noted   Unspecified systolic (congestive) heart failure (HCC) 05/14/2023   Edema of both lower legs 09/03/2022   Closed right hip fracture, with routine healing, subsequent encounter 07/12/2022   Acquired thrombophilia (HCC) 09/25/2020   Pseudogout of right knee 08/04/2020   Aortic atherosclerosis (HCC) 07/10/2020   Hyponatremia 06/16/2020   Hypokalemia 12/17/2019   Chest wall recurrence of right breast cancer (HCC) 11/16/2019   Osteoporosis 06/17/2019   Primary open angle glaucoma (POAG) of left eye, moderate stage 04/25/2018   Persistent atrial fibrillation (HCC) 12/28/2016   Carcinoma of overlapping sites of right breast in female, estrogen receptor positive (HCC) 12/21/2016   Senile dementia, uncomplicated (HCC) 04/12/2016   Low back pain 04/12/2016  Impaired renal function 05/09/2015   Arteriosclerosis of coronary artery 05/09/2015   Essential (primary) hypertension 05/09/2015   Personal history of malignant neoplasm of breast 05/09/2015   Muscle spasms of neck 05/09/2015   Arthritis of shoulder region, degenerative 05/09/2015   Hypercholesteremia 04/20/2012   Carotid artery disease (HCC) 05/28/2005    Allergies  Allergen Reactions   Ace Inhibitors     Other reaction(s): Angioedema   Brinzolamide-Brimonidine      Past Surgical History:  Procedure Laterality Date   ARTHROTOMY KNEE-INFEC W/EXPLOR/DRAIN/REMOV FB Right 02/2022   CAROTID ENDARTERECTOMY     CORONARY ARTERY BYPASS GRAFT  2006   MASS EXCISION Right 08/28/2015   Procedure: EXCISION MASS; remove massive right chest wall;  Surgeon: Lattie Haw, MD;  Location: [facility] ORS;  Service: General;  Laterality: Right;   MASTECTOMY, RADICAL Right 2002   BREAST CA   TOTAL ABDOMINAL HYSTERECTOMY      Social History   Tobacco Use   Smoking status: Former    Packs/day: 1.50    Years: 1.50    Additional pack years: 0.00    Total pack years: 2.25    Types: Cigarettes    Quit date: 10/04/1952    Years since quitting: 70.7   Smokeless tobacco: Never  Vaping Use   Vaping Use: Never used  Substance Use Topics   Alcohol use: No    Alcohol/week: 0.0 standard drinks of alcohol   Drug use: No     Medication list has been reviewed and updated.  Current Meds  Medication Sig   acetaminophen (TYLENOL) 500 MG tablet Take 500 mg by mouth every 6 (six) hours as needed.   apixaban (ELIQUIS) 2.5 MG TABS tablet TAKE 1 TABLET(2.5 MG) BY MOUTH TWICE DAILY   Calcium Carb-Cholecalciferol 600-800 MG-UNIT TABS Take 1 tablet by mouth daily. Reported on 12/25/2015   Cholecalciferol (VITAMIN D-3) 125 MCG (5000 UT) TABS Take 1 tablet by mouth daily.   Cranberry-Vitamin C-Probiotic (AZO CRANBERRY PO) Take by mouth in the morning and at bedtime.   empagliflozin (JARDIANCE) 10 MG TABS tablet Take 1 tablet by mouth daily.   furosemide (LASIX) 20 MG tablet Take 1 tablet by mouth daily.   latanoprost (XALATAN) 0.005 % ophthalmic solution INT 1 GTT INTO EACH EYE QHS.   losartan (COZAAR) 25 MG tablet Take 25 mg by mouth daily.   metoprolol succinate (TOPROL-XL) 50 MG 24 hr tablet TAKE 1 TABLET BY MOUTH EVERY NIGHT (Patient taking differently: Take 50 mg by mouth daily.)   nitroGLYCERIN (NITROSTAT) 0.4 MG SL tablet Place 0.4 mg under the tongue every 5 (five) minutes  as needed for chest pain.   spironolactone (ALDACTONE) 25 MG tablet Take 1 tablet by mouth daily.   timolol (TIMOPTIC) 0.25 % ophthalmic solution INSTILL ONE DROP INTO BOTH EYES EVERY MORNING.   TURMERIC PO Take 1 tablet by mouth daily.   [DISCONTINUED] isosorbide mononitrate (IMDUR) 30 MG 24 hr tablet TAKE 1 TABLET(30 MG) BY MOUTH DAILY       06/06/2023    9:50 AM 11/03/2022    3:14 PM 09/03/2022    4:02 PM 08/17/2022    2:11 PM  GAD 7 : Generalized Anxiety Score  Nervous, Anxious, on Edge 0 0 0 0  Control/stop worrying 0 0 1 0  Worry too much - different things 0 0 0 0  Trouble relaxing 0 0 0 0  Restless 0 0 0 0  Easily annoyed or irritable 0 0 0 0  Afraid -  awful might happen 0 0 0 0  Total GAD 7 Score 0 0 1 0  Anxiety Difficulty Not difficult at all Not difficult at all Not difficult at all Not difficult at all       06/06/2023    9:50 AM 11/03/2022    3:14 PM 09/03/2022    4:01 PM  Depression screen PHQ 2/9  Decreased Interest 0 0 0  Down, Depressed, Hopeless 0 0 0  PHQ - 2 Score 0 0 0  Altered sleeping 0 0 0  Tired, decreased energy 1 2 1   Change in appetite 0 0 0  Feeling bad or failure about yourself  0 0 0  Trouble concentrating 1 0 0  Moving slowly or fidgety/restless 1 0 0  Suicidal thoughts 0 0 0  PHQ-9 Score 3 2 1   Difficult doing work/chores Not difficult at all Not difficult at all Not difficult at all    BP Readings from Last 3 Encounters:  06/06/23 128/76  06/06/23 128/76  11/03/22 (!) 110/58    Physical Exam Vitals and nursing note reviewed.  Constitutional:      General: She is not in acute distress.    Appearance: Normal appearance. She is well-developed.  HENT:     Head: Normocephalic and atraumatic.  Cardiovascular:     Rate and Rhythm: Normal rate. Rhythm irregular.     Heart sounds: No murmur heard. Pulmonary:     Effort: Pulmonary effort is normal. No respiratory distress.     Breath sounds: No wheezing or rhonchi.  Abdominal:      General: Abdomen is flat.     Palpations: Abdomen is soft.  Musculoskeletal:     Cervical back: Normal range of motion.     Right lower leg: No edema.     Left lower leg: No edema.  Skin:    General: Skin is warm and dry.     Findings: No rash.     Comments: Dry with decreased turgor  Neurological:     General: No focal deficit present.     Mental Status: She is alert and oriented to person, place, and time.  Psychiatric:        Mood and Affect: Mood normal.        Behavior: Behavior normal.     Wt Readings from Last 3 Encounters:  06/06/23 108 lb (49 kg)  06/06/23 108 lb (49 kg)  11/03/22 118 lb (53.5 kg)    BP 128/76   Pulse 76   Ht 5' (1.524 m)   Wt 108 lb (49 kg)   SpO2 97%   BMI 21.09 kg/m   Assessment and Plan:  Problem List Items Addressed This Visit     Unspecified systolic (congestive) heart failure (HCC)    Started on Jardiance, Spironolactone, Lasix and Losartan. Appears clinically slightly dry Will get CMP and advise Follow up with Cardiology as planned this week      Relevant Medications   spironolactone (ALDACTONE) 25 MG tablet   furosemide (LASIX) 20 MG tablet   losartan (COZAAR) 25 MG tablet   Other Relevant Orders   Comprehensive metabolic panel   Pseudogout of right knee (Chronic)    Recurrent joint pain treated with prn Colchicine.      Relevant Medications   acetaminophen (TYLENOL) 500 MG tablet   Persistent atrial fibrillation (HCC) (Chronic)    Followed by Cardiology. On beta blocker and Eliquis. No bleeding issues noted.      Relevant Medications  spironolactone (ALDACTONE) 25 MG tablet   furosemide (LASIX) 20 MG tablet   losartan (COZAAR) 25 MG tablet   Impaired renal function (Chronic)    Long standing decrease in GFR Most recent GFR 57      Essential (primary) hypertension - Primary (Chronic)    Stable exam with well controlled BP.  Currently taking Losartan, spironolactone, lasix and metoprolol. Recent diagnosis of  CHF with EF 40%. Tolerating medications without concerns or side effects. Will continue to recommend low sodium diet and current regimen.       Relevant Medications   spironolactone (ALDACTONE) 25 MG tablet   furosemide (LASIX) 20 MG tablet   losartan (COZAAR) 25 MG tablet   Other Relevant Orders   Comprehensive metabolic panel    Return in about 4 months (around 10/07/2023) for HTN.  MAW completed by CMA. Partially dictated using Dragon software, any errors are not intentional.  Reubin Milan, MD [facility] [place], [place]

## 2023-06-07 ENCOUNTER — Other Ambulatory Visit (INDEPENDENT_AMBULATORY_CARE_PROVIDER_SITE_OTHER): Payer: Medicare Other | Admitting: Internal Medicine

## 2023-06-07 ENCOUNTER — Telehealth: Payer: Self-pay | Admitting: Internal Medicine

## 2023-06-07 DIAGNOSIS — I4819 Other persistent atrial fibrillation: Secondary | ICD-10-CM

## 2023-06-07 DIAGNOSIS — J9601 Acute respiratory failure with hypoxia: Secondary | ICD-10-CM

## 2023-06-07 DIAGNOSIS — E7800 Pure hypercholesterolemia, unspecified: Secondary | ICD-10-CM

## 2023-06-07 DIAGNOSIS — I6529 Occlusion and stenosis of unspecified carotid artery: Secondary | ICD-10-CM

## 2023-06-07 DIAGNOSIS — I5021 Acute systolic (congestive) heart failure: Secondary | ICD-10-CM

## 2023-06-07 DIAGNOSIS — I2510 Atherosclerotic heart disease of native coronary artery without angina pectoris: Secondary | ICD-10-CM

## 2023-06-07 DIAGNOSIS — I5020 Unspecified systolic (congestive) heart failure: Secondary | ICD-10-CM | POA: Insufficient documentation

## 2023-06-07 DIAGNOSIS — Z853 Personal history of malignant neoplasm of breast: Secondary | ICD-10-CM

## 2023-06-07 DIAGNOSIS — Z9181 History of falling: Secondary | ICD-10-CM

## 2023-06-07 DIAGNOSIS — F0390 Unspecified dementia without behavioral disturbance: Secondary | ICD-10-CM

## 2023-06-07 DIAGNOSIS — M810 Age-related osteoporosis without current pathological fracture: Secondary | ICD-10-CM

## 2023-06-07 DIAGNOSIS — N289 Disorder of kidney and ureter, unspecified: Secondary | ICD-10-CM

## 2023-06-07 DIAGNOSIS — M11261 Other chondrocalcinosis, right knee: Secondary | ICD-10-CM

## 2023-06-07 DIAGNOSIS — D6869 Other thrombophilia: Secondary | ICD-10-CM

## 2023-06-07 DIAGNOSIS — M19011 Primary osteoarthritis, right shoulder: Secondary | ICD-10-CM

## 2023-06-07 DIAGNOSIS — I10 Essential (primary) hypertension: Secondary | ICD-10-CM

## 2023-06-07 DIAGNOSIS — I110 Hypertensive heart disease with heart failure: Secondary | ICD-10-CM

## 2023-06-07 DIAGNOSIS — I700 Atherosclerosis of aorta: Secondary | ICD-10-CM

## 2023-06-07 DIAGNOSIS — Z170 Estrogen receptor positive status [ER+]: Secondary | ICD-10-CM

## 2023-06-07 HISTORY — DX: Acute respiratory failure with hypoxia: J96.01

## 2023-06-07 LAB — COMPREHENSIVE METABOLIC PANEL
ALT: 12 IU/L (ref 0–32)
AST: 15 IU/L (ref 0–40)
Albumin: 4.3 g/dL (ref 3.6–4.6)
Alkaline Phosphatase: 65 IU/L (ref 44–121)
BUN/Creatinine Ratio: 57 — ABNORMAL HIGH (ref 12–28)
BUN: 68 mg/dL — ABNORMAL HIGH (ref 10–36)
Bilirubin Total: 0.3 mg/dL (ref 0.0–1.2)
CO2: 23 mmol/L (ref 20–29)
Calcium: 10 mg/dL (ref 8.7–10.3)
Chloride: 99 mmol/L (ref 96–106)
Creatinine, Ser: 1.2 mg/dL — ABNORMAL HIGH (ref 0.57–1.00)
Globulin, Total: 3 g/dL (ref 1.5–4.5)
Glucose: 108 mg/dL — ABNORMAL HIGH (ref 70–99)
Potassium: 4.9 mmol/L (ref 3.5–5.2)
Sodium: 140 mmol/L (ref 134–144)
Total Protein: 7.3 g/dL (ref 6.0–8.5)
eGFR: 41 mL/min/{1.73_m2} — ABNORMAL LOW (ref >59)

## 2023-06-07 NOTE — Telephone Encounter (Signed)
Copied from CRM [id number]. Topic: General - Other >> Jun 07, 2023  4:27 PM Ja-Kwan M wrote: Reason for CRM: Pt son Chip Sedore stated he was returning a missed call from the office. Cb# [phone]

## 2023-06-07 NOTE — Progress Notes (Signed)
Received home health orders orders from [facility]. Start of care 05/14/23.   Certification and orders from 05/14/23 through 07/12/23 are reviewed, signed and faxed back to home health company.  Need of intermittent skilled services at home: home bound  The home health care plan has been established by me and will be reviewed and updated as needed to maximize patient recovery.  I certify that all home health services have been and will be furnished to the patient while under my care.  Face-to-face encounter in which the need for home health services was established: 06/06/23.  Patient is receiving home health services for the following diagnoses: Problem List Items Addressed This Visit     Senile dementia, uncomplicated (HCC) (Chronic)   Pseudogout of right knee (Chronic)   Personal history of malignant neoplasm of breast   Personal history of fall   Persistent atrial fibrillation (HCC) (Chronic)   Osteoporosis   Impaired renal function (Chronic)   Hypertensive heart disease with congestive heart failure (HCC)   Hypercholesteremia   Heart failure, systolic (HCC) - Primary   Essential (primary) hypertension (Chronic)   Carotid artery disease (HCC)   Carcinoma of overlapping sites of right breast in female, estrogen receptor positive (HCC)   Arthritis of shoulder region, degenerative   Arteriosclerosis of coronary artery (Chronic)   Aortic atherosclerosis (HCC)   Acute respiratory failure with hypoxia (HCC)   Acquired thrombophilia (HCC) (Chronic)     Bari Edward, MD

## 2023-06-08 DIAGNOSIS — M11261 Other chondrocalcinosis, right knee: Secondary | ICD-10-CM | POA: Diagnosis not present

## 2023-06-08 DIAGNOSIS — Z9181 History of falling: Secondary | ICD-10-CM | POA: Diagnosis not present

## 2023-06-08 DIAGNOSIS — M810 Age-related osteoporosis without current pathological fracture: Secondary | ICD-10-CM | POA: Diagnosis not present

## 2023-06-08 DIAGNOSIS — I081 Rheumatic disorders of both mitral and tricuspid valves: Secondary | ICD-10-CM | POA: Diagnosis not present

## 2023-06-08 DIAGNOSIS — Z7984 Long term (current) use of oral hypoglycemic drugs: Secondary | ICD-10-CM | POA: Diagnosis not present

## 2023-06-08 DIAGNOSIS — D696 Thrombocytopenia, unspecified: Secondary | ICD-10-CM | POA: Diagnosis not present

## 2023-06-08 DIAGNOSIS — I110 Hypertensive heart disease with heart failure: Secondary | ICD-10-CM | POA: Diagnosis not present

## 2023-06-08 DIAGNOSIS — C50811 Malignant neoplasm of overlapping sites of right female breast: Secondary | ICD-10-CM | POA: Diagnosis not present

## 2023-06-08 DIAGNOSIS — I5020 Unspecified systolic (congestive) heart failure: Secondary | ICD-10-CM | POA: Diagnosis not present

## 2023-06-08 DIAGNOSIS — Z951 Presence of aortocoronary bypass graft: Secondary | ICD-10-CM | POA: Diagnosis not present

## 2023-06-08 DIAGNOSIS — I4819 Other persistent atrial fibrillation: Secondary | ICD-10-CM | POA: Diagnosis not present

## 2023-06-08 DIAGNOSIS — J9601 Acute respiratory failure with hypoxia: Secondary | ICD-10-CM | POA: Diagnosis not present

## 2023-06-08 DIAGNOSIS — Z170 Estrogen receptor positive status [ER+]: Secondary | ICD-10-CM | POA: Diagnosis not present

## 2023-06-08 DIAGNOSIS — D649 Anemia, unspecified: Secondary | ICD-10-CM | POA: Diagnosis not present

## 2023-06-08 DIAGNOSIS — Z7901 Long term (current) use of anticoagulants: Secondary | ICD-10-CM | POA: Diagnosis not present

## 2023-06-08 DIAGNOSIS — I2510 Atherosclerotic heart disease of native coronary artery without angina pectoris: Secondary | ICD-10-CM | POA: Diagnosis not present

## 2023-06-08 DIAGNOSIS — E785 Hyperlipidemia, unspecified: Secondary | ICD-10-CM | POA: Diagnosis not present

## 2023-06-08 DIAGNOSIS — N281 Cyst of kidney, acquired: Secondary | ICD-10-CM | POA: Diagnosis not present

## 2023-06-09 DIAGNOSIS — I081 Rheumatic disorders of both mitral and tricuspid valves: Secondary | ICD-10-CM | POA: Diagnosis not present

## 2023-06-09 DIAGNOSIS — D696 Thrombocytopenia, unspecified: Secondary | ICD-10-CM | POA: Diagnosis not present

## 2023-06-09 DIAGNOSIS — I2510 Atherosclerotic heart disease of native coronary artery without angina pectoris: Secondary | ICD-10-CM | POA: Diagnosis not present

## 2023-06-09 DIAGNOSIS — E785 Hyperlipidemia, unspecified: Secondary | ICD-10-CM | POA: Diagnosis not present

## 2023-06-09 DIAGNOSIS — I5020 Unspecified systolic (congestive) heart failure: Secondary | ICD-10-CM | POA: Diagnosis not present

## 2023-06-09 DIAGNOSIS — C50811 Malignant neoplasm of overlapping sites of right female breast: Secondary | ICD-10-CM | POA: Diagnosis not present

## 2023-06-09 DIAGNOSIS — Z170 Estrogen receptor positive status [ER+]: Secondary | ICD-10-CM | POA: Diagnosis not present

## 2023-06-09 DIAGNOSIS — J9601 Acute respiratory failure with hypoxia: Secondary | ICD-10-CM | POA: Diagnosis not present

## 2023-06-09 DIAGNOSIS — Z951 Presence of aortocoronary bypass graft: Secondary | ICD-10-CM | POA: Diagnosis not present

## 2023-06-09 DIAGNOSIS — M11261 Other chondrocalcinosis, right knee: Secondary | ICD-10-CM | POA: Diagnosis not present

## 2023-06-09 DIAGNOSIS — Z7901 Long term (current) use of anticoagulants: Secondary | ICD-10-CM | POA: Diagnosis not present

## 2023-06-09 DIAGNOSIS — D649 Anemia, unspecified: Secondary | ICD-10-CM | POA: Diagnosis not present

## 2023-06-09 DIAGNOSIS — I4819 Other persistent atrial fibrillation: Secondary | ICD-10-CM | POA: Diagnosis not present

## 2023-06-09 DIAGNOSIS — Z9181 History of falling: Secondary | ICD-10-CM | POA: Diagnosis not present

## 2023-06-09 DIAGNOSIS — I110 Hypertensive heart disease with heart failure: Secondary | ICD-10-CM | POA: Diagnosis not present

## 2023-06-09 DIAGNOSIS — M810 Age-related osteoporosis without current pathological fracture: Secondary | ICD-10-CM | POA: Diagnosis not present

## 2023-06-09 DIAGNOSIS — N281 Cyst of kidney, acquired: Secondary | ICD-10-CM | POA: Diagnosis not present

## 2023-06-09 DIAGNOSIS — Z7984 Long term (current) use of oral hypoglycemic drugs: Secondary | ICD-10-CM | POA: Diagnosis not present

## 2023-06-10 DIAGNOSIS — I4819 Other persistent atrial fibrillation: Secondary | ICD-10-CM | POA: Diagnosis not present

## 2023-06-10 DIAGNOSIS — I5020 Unspecified systolic (congestive) heart failure: Secondary | ICD-10-CM | POA: Diagnosis not present

## 2023-06-16 DIAGNOSIS — M11261 Other chondrocalcinosis, right knee: Secondary | ICD-10-CM | POA: Diagnosis not present

## 2023-06-16 DIAGNOSIS — Z7901 Long term (current) use of anticoagulants: Secondary | ICD-10-CM | POA: Diagnosis not present

## 2023-06-16 DIAGNOSIS — I2510 Atherosclerotic heart disease of native coronary artery without angina pectoris: Secondary | ICD-10-CM | POA: Diagnosis not present

## 2023-06-16 DIAGNOSIS — Z9181 History of falling: Secondary | ICD-10-CM | POA: Diagnosis not present

## 2023-06-16 DIAGNOSIS — I110 Hypertensive heart disease with heart failure: Secondary | ICD-10-CM | POA: Diagnosis not present

## 2023-06-16 DIAGNOSIS — J9601 Acute respiratory failure with hypoxia: Secondary | ICD-10-CM | POA: Diagnosis not present

## 2023-06-16 DIAGNOSIS — M810 Age-related osteoporosis without current pathological fracture: Secondary | ICD-10-CM | POA: Diagnosis not present

## 2023-06-16 DIAGNOSIS — E785 Hyperlipidemia, unspecified: Secondary | ICD-10-CM | POA: Diagnosis not present

## 2023-06-16 DIAGNOSIS — Z951 Presence of aortocoronary bypass graft: Secondary | ICD-10-CM | POA: Diagnosis not present

## 2023-06-16 DIAGNOSIS — C50811 Malignant neoplasm of overlapping sites of right female breast: Secondary | ICD-10-CM | POA: Diagnosis not present

## 2023-06-16 DIAGNOSIS — D696 Thrombocytopenia, unspecified: Secondary | ICD-10-CM | POA: Diagnosis not present

## 2023-06-16 DIAGNOSIS — Z170 Estrogen receptor positive status [ER+]: Secondary | ICD-10-CM | POA: Diagnosis not present

## 2023-06-16 DIAGNOSIS — I4819 Other persistent atrial fibrillation: Secondary | ICD-10-CM | POA: Diagnosis not present

## 2023-06-16 DIAGNOSIS — I5020 Unspecified systolic (congestive) heart failure: Secondary | ICD-10-CM | POA: Diagnosis not present

## 2023-06-16 DIAGNOSIS — Z7984 Long term (current) use of oral hypoglycemic drugs: Secondary | ICD-10-CM | POA: Diagnosis not present

## 2023-06-16 DIAGNOSIS — N281 Cyst of kidney, acquired: Secondary | ICD-10-CM | POA: Diagnosis not present

## 2023-06-16 DIAGNOSIS — D649 Anemia, unspecified: Secondary | ICD-10-CM | POA: Diagnosis not present

## 2023-06-16 DIAGNOSIS — I081 Rheumatic disorders of both mitral and tricuspid valves: Secondary | ICD-10-CM | POA: Diagnosis not present

## 2023-06-23 DIAGNOSIS — I2510 Atherosclerotic heart disease of native coronary artery without angina pectoris: Secondary | ICD-10-CM | POA: Diagnosis not present

## 2023-06-23 DIAGNOSIS — D649 Anemia, unspecified: Secondary | ICD-10-CM | POA: Diagnosis not present

## 2023-06-23 DIAGNOSIS — I081 Rheumatic disorders of both mitral and tricuspid valves: Secondary | ICD-10-CM | POA: Diagnosis not present

## 2023-06-23 DIAGNOSIS — I4819 Other persistent atrial fibrillation: Secondary | ICD-10-CM | POA: Diagnosis not present

## 2023-06-23 DIAGNOSIS — I5020 Unspecified systolic (congestive) heart failure: Secondary | ICD-10-CM | POA: Diagnosis not present

## 2023-06-23 DIAGNOSIS — M11261 Other chondrocalcinosis, right knee: Secondary | ICD-10-CM | POA: Diagnosis not present

## 2023-06-23 DIAGNOSIS — Z951 Presence of aortocoronary bypass graft: Secondary | ICD-10-CM | POA: Diagnosis not present

## 2023-06-23 DIAGNOSIS — C50811 Malignant neoplasm of overlapping sites of right female breast: Secondary | ICD-10-CM | POA: Diagnosis not present

## 2023-06-23 DIAGNOSIS — D696 Thrombocytopenia, unspecified: Secondary | ICD-10-CM | POA: Diagnosis not present

## 2023-06-23 DIAGNOSIS — Z170 Estrogen receptor positive status [ER+]: Secondary | ICD-10-CM | POA: Diagnosis not present

## 2023-06-23 DIAGNOSIS — E785 Hyperlipidemia, unspecified: Secondary | ICD-10-CM | POA: Diagnosis not present

## 2023-06-23 DIAGNOSIS — I110 Hypertensive heart disease with heart failure: Secondary | ICD-10-CM | POA: Diagnosis not present

## 2023-06-23 DIAGNOSIS — Z7901 Long term (current) use of anticoagulants: Secondary | ICD-10-CM | POA: Diagnosis not present

## 2023-06-23 DIAGNOSIS — J9601 Acute respiratory failure with hypoxia: Secondary | ICD-10-CM | POA: Diagnosis not present

## 2023-06-23 DIAGNOSIS — Z7984 Long term (current) use of oral hypoglycemic drugs: Secondary | ICD-10-CM | POA: Diagnosis not present

## 2023-06-23 DIAGNOSIS — N281 Cyst of kidney, acquired: Secondary | ICD-10-CM | POA: Diagnosis not present

## 2023-06-23 DIAGNOSIS — M810 Age-related osteoporosis without current pathological fracture: Secondary | ICD-10-CM | POA: Diagnosis not present

## 2023-06-23 DIAGNOSIS — Z9181 History of falling: Secondary | ICD-10-CM | POA: Diagnosis not present

## 2023-06-24 ENCOUNTER — Other Ambulatory Visit: Payer: Self-pay | Admitting: Internal Medicine

## 2023-06-24 NOTE — Telephone Encounter (Signed)
Requested Prescriptions  Pending Prescriptions Disp Refills   metoprolol succinate (TOPROL-XL) 50 MG 24 hr tablet [Pharmacy Med Name: METOPROLOL ER SUCCINATE 50MG  TABS] 90 tablet 1    Sig: TAKE 1 TABLET BY MOUTH EVERY NIGHT     Cardiovascular:  Beta Blockers Passed - 06/24/2023  8:00 AM      Passed - Last BP in normal range    BP Readings from Last 1 Encounters:  06/06/23 128/76         Passed - Last Heart Rate in normal range    Pulse Readings from Last 1 Encounters:  06/06/23 80         Passed - Valid encounter within last 6 months    Recent Outpatient Visits           2 weeks ago Essential (primary) hypertension   [facility] at [facility], Nyoka Cowden, MD   7 months ago Essential (primary) hypertension   [facility] at [facility], Nyoka Cowden, MD   9 months ago Edema of both lower legs   [facility] at [facility], Nyoka Cowden, MD   10 months ago Cellulitis of left lower extremity   [facility] at [facility], Nyoka Cowden, MD   11 months ago Confusion   [facility] at [facility], Nyoka Cowden, MD       Future Appointments             In 3 months Judithann Graves, Nyoka Cowden, MD [facility] at [facility], [facility]

## 2023-06-30 DIAGNOSIS — I2510 Atherosclerotic heart disease of native coronary artery without angina pectoris: Secondary | ICD-10-CM | POA: Diagnosis not present

## 2023-06-30 DIAGNOSIS — I110 Hypertensive heart disease with heart failure: Secondary | ICD-10-CM | POA: Diagnosis not present

## 2023-06-30 DIAGNOSIS — Z170 Estrogen receptor positive status [ER+]: Secondary | ICD-10-CM | POA: Diagnosis not present

## 2023-06-30 DIAGNOSIS — Z7984 Long term (current) use of oral hypoglycemic drugs: Secondary | ICD-10-CM | POA: Diagnosis not present

## 2023-06-30 DIAGNOSIS — M11261 Other chondrocalcinosis, right knee: Secondary | ICD-10-CM | POA: Diagnosis not present

## 2023-06-30 DIAGNOSIS — D649 Anemia, unspecified: Secondary | ICD-10-CM | POA: Diagnosis not present

## 2023-06-30 DIAGNOSIS — Z951 Presence of aortocoronary bypass graft: Secondary | ICD-10-CM | POA: Diagnosis not present

## 2023-06-30 DIAGNOSIS — E785 Hyperlipidemia, unspecified: Secondary | ICD-10-CM | POA: Diagnosis not present

## 2023-06-30 DIAGNOSIS — N281 Cyst of kidney, acquired: Secondary | ICD-10-CM | POA: Diagnosis not present

## 2023-06-30 DIAGNOSIS — M810 Age-related osteoporosis without current pathological fracture: Secondary | ICD-10-CM | POA: Diagnosis not present

## 2023-06-30 DIAGNOSIS — I081 Rheumatic disorders of both mitral and tricuspid valves: Secondary | ICD-10-CM | POA: Diagnosis not present

## 2023-06-30 DIAGNOSIS — D696 Thrombocytopenia, unspecified: Secondary | ICD-10-CM | POA: Diagnosis not present

## 2023-06-30 DIAGNOSIS — Z7901 Long term (current) use of anticoagulants: Secondary | ICD-10-CM | POA: Diagnosis not present

## 2023-06-30 DIAGNOSIS — I4819 Other persistent atrial fibrillation: Secondary | ICD-10-CM | POA: Diagnosis not present

## 2023-06-30 DIAGNOSIS — C50811 Malignant neoplasm of overlapping sites of right female breast: Secondary | ICD-10-CM | POA: Diagnosis not present

## 2023-06-30 DIAGNOSIS — Z9181 History of falling: Secondary | ICD-10-CM | POA: Diagnosis not present

## 2023-06-30 DIAGNOSIS — I5020 Unspecified systolic (congestive) heart failure: Secondary | ICD-10-CM | POA: Diagnosis not present

## 2023-06-30 DIAGNOSIS — J9601 Acute respiratory failure with hypoxia: Secondary | ICD-10-CM | POA: Diagnosis not present

## 2023-07-05 DIAGNOSIS — C50811 Malignant neoplasm of overlapping sites of right female breast: Secondary | ICD-10-CM | POA: Diagnosis not present

## 2023-07-05 DIAGNOSIS — D696 Thrombocytopenia, unspecified: Secondary | ICD-10-CM | POA: Diagnosis not present

## 2023-07-05 DIAGNOSIS — Z951 Presence of aortocoronary bypass graft: Secondary | ICD-10-CM | POA: Diagnosis not present

## 2023-07-05 DIAGNOSIS — D649 Anemia, unspecified: Secondary | ICD-10-CM | POA: Diagnosis not present

## 2023-07-05 DIAGNOSIS — N281 Cyst of kidney, acquired: Secondary | ICD-10-CM | POA: Diagnosis not present

## 2023-07-05 DIAGNOSIS — J9601 Acute respiratory failure with hypoxia: Secondary | ICD-10-CM | POA: Diagnosis not present

## 2023-07-05 DIAGNOSIS — M810 Age-related osteoporosis without current pathological fracture: Secondary | ICD-10-CM | POA: Diagnosis not present

## 2023-07-05 DIAGNOSIS — E785 Hyperlipidemia, unspecified: Secondary | ICD-10-CM | POA: Diagnosis not present

## 2023-07-05 DIAGNOSIS — Z7901 Long term (current) use of anticoagulants: Secondary | ICD-10-CM | POA: Diagnosis not present

## 2023-07-05 DIAGNOSIS — I4819 Other persistent atrial fibrillation: Secondary | ICD-10-CM | POA: Diagnosis not present

## 2023-07-05 DIAGNOSIS — M11261 Other chondrocalcinosis, right knee: Secondary | ICD-10-CM | POA: Diagnosis not present

## 2023-07-05 DIAGNOSIS — I110 Hypertensive heart disease with heart failure: Secondary | ICD-10-CM | POA: Diagnosis not present

## 2023-07-05 DIAGNOSIS — I081 Rheumatic disorders of both mitral and tricuspid valves: Secondary | ICD-10-CM | POA: Diagnosis not present

## 2023-07-05 DIAGNOSIS — I2510 Atherosclerotic heart disease of native coronary artery without angina pectoris: Secondary | ICD-10-CM | POA: Diagnosis not present

## 2023-07-05 DIAGNOSIS — Z9181 History of falling: Secondary | ICD-10-CM | POA: Diagnosis not present

## 2023-07-05 DIAGNOSIS — Z7984 Long term (current) use of oral hypoglycemic drugs: Secondary | ICD-10-CM | POA: Diagnosis not present

## 2023-07-05 DIAGNOSIS — Z170 Estrogen receptor positive status [ER+]: Secondary | ICD-10-CM | POA: Diagnosis not present

## 2023-07-05 DIAGNOSIS — I5020 Unspecified systolic (congestive) heart failure: Secondary | ICD-10-CM | POA: Diagnosis not present

## 2023-07-07 DIAGNOSIS — D696 Thrombocytopenia, unspecified: Secondary | ICD-10-CM | POA: Diagnosis not present

## 2023-07-07 DIAGNOSIS — J9601 Acute respiratory failure with hypoxia: Secondary | ICD-10-CM | POA: Diagnosis not present

## 2023-07-07 DIAGNOSIS — C50811 Malignant neoplasm of overlapping sites of right female breast: Secondary | ICD-10-CM | POA: Diagnosis not present

## 2023-07-07 DIAGNOSIS — I4819 Other persistent atrial fibrillation: Secondary | ICD-10-CM | POA: Diagnosis not present

## 2023-07-07 DIAGNOSIS — N281 Cyst of kidney, acquired: Secondary | ICD-10-CM | POA: Diagnosis not present

## 2023-07-07 DIAGNOSIS — I110 Hypertensive heart disease with heart failure: Secondary | ICD-10-CM | POA: Diagnosis not present

## 2023-07-07 DIAGNOSIS — Z7984 Long term (current) use of oral hypoglycemic drugs: Secondary | ICD-10-CM | POA: Diagnosis not present

## 2023-07-07 DIAGNOSIS — D649 Anemia, unspecified: Secondary | ICD-10-CM | POA: Diagnosis not present

## 2023-07-07 DIAGNOSIS — Z951 Presence of aortocoronary bypass graft: Secondary | ICD-10-CM | POA: Diagnosis not present

## 2023-07-07 DIAGNOSIS — E785 Hyperlipidemia, unspecified: Secondary | ICD-10-CM | POA: Diagnosis not present

## 2023-07-07 DIAGNOSIS — I2510 Atherosclerotic heart disease of native coronary artery without angina pectoris: Secondary | ICD-10-CM | POA: Diagnosis not present

## 2023-07-07 DIAGNOSIS — M810 Age-related osteoporosis without current pathological fracture: Secondary | ICD-10-CM | POA: Diagnosis not present

## 2023-07-07 DIAGNOSIS — I5020 Unspecified systolic (congestive) heart failure: Secondary | ICD-10-CM | POA: Diagnosis not present

## 2023-07-07 DIAGNOSIS — Z170 Estrogen receptor positive status [ER+]: Secondary | ICD-10-CM | POA: Diagnosis not present

## 2023-07-07 DIAGNOSIS — Z7901 Long term (current) use of anticoagulants: Secondary | ICD-10-CM | POA: Diagnosis not present

## 2023-07-07 DIAGNOSIS — I081 Rheumatic disorders of both mitral and tricuspid valves: Secondary | ICD-10-CM | POA: Diagnosis not present

## 2023-07-07 DIAGNOSIS — M11261 Other chondrocalcinosis, right knee: Secondary | ICD-10-CM | POA: Diagnosis not present

## 2023-07-07 DIAGNOSIS — Z9181 History of falling: Secondary | ICD-10-CM | POA: Diagnosis not present

## 2023-07-12 DIAGNOSIS — C50811 Malignant neoplasm of overlapping sites of right female breast: Secondary | ICD-10-CM | POA: Diagnosis not present

## 2023-07-12 DIAGNOSIS — M11261 Other chondrocalcinosis, right knee: Secondary | ICD-10-CM | POA: Diagnosis not present

## 2023-07-12 DIAGNOSIS — M810 Age-related osteoporosis without current pathological fracture: Secondary | ICD-10-CM | POA: Diagnosis not present

## 2023-07-12 DIAGNOSIS — I5020 Unspecified systolic (congestive) heart failure: Secondary | ICD-10-CM | POA: Diagnosis not present

## 2023-07-12 DIAGNOSIS — E785 Hyperlipidemia, unspecified: Secondary | ICD-10-CM | POA: Diagnosis not present

## 2023-07-12 DIAGNOSIS — N281 Cyst of kidney, acquired: Secondary | ICD-10-CM | POA: Diagnosis not present

## 2023-07-12 DIAGNOSIS — Z170 Estrogen receptor positive status [ER+]: Secondary | ICD-10-CM | POA: Diagnosis not present

## 2023-07-12 DIAGNOSIS — I110 Hypertensive heart disease with heart failure: Secondary | ICD-10-CM | POA: Diagnosis not present

## 2023-07-12 DIAGNOSIS — I081 Rheumatic disorders of both mitral and tricuspid valves: Secondary | ICD-10-CM | POA: Diagnosis not present

## 2023-07-12 DIAGNOSIS — D696 Thrombocytopenia, unspecified: Secondary | ICD-10-CM | POA: Diagnosis not present

## 2023-07-12 DIAGNOSIS — Z9181 History of falling: Secondary | ICD-10-CM | POA: Diagnosis not present

## 2023-07-12 DIAGNOSIS — I2510 Atherosclerotic heart disease of native coronary artery without angina pectoris: Secondary | ICD-10-CM | POA: Diagnosis not present

## 2023-07-12 DIAGNOSIS — D649 Anemia, unspecified: Secondary | ICD-10-CM | POA: Diagnosis not present

## 2023-07-12 DIAGNOSIS — I4819 Other persistent atrial fibrillation: Secondary | ICD-10-CM | POA: Diagnosis not present

## 2023-07-12 DIAGNOSIS — Z7984 Long term (current) use of oral hypoglycemic drugs: Secondary | ICD-10-CM | POA: Diagnosis not present

## 2023-07-12 DIAGNOSIS — J9601 Acute respiratory failure with hypoxia: Secondary | ICD-10-CM | POA: Diagnosis not present

## 2023-07-12 DIAGNOSIS — Z7901 Long term (current) use of anticoagulants: Secondary | ICD-10-CM | POA: Diagnosis not present

## 2023-07-12 DIAGNOSIS — Z951 Presence of aortocoronary bypass graft: Secondary | ICD-10-CM | POA: Diagnosis not present

## 2023-07-14 DIAGNOSIS — M79674 Pain in right toe(s): Secondary | ICD-10-CM | POA: Diagnosis not present

## 2023-07-14 DIAGNOSIS — L03032 Cellulitis of left toe: Secondary | ICD-10-CM | POA: Diagnosis not present

## 2023-07-14 DIAGNOSIS — B351 Tinea unguium: Secondary | ICD-10-CM | POA: Diagnosis not present

## 2023-07-14 DIAGNOSIS — M79675 Pain in left toe(s): Secondary | ICD-10-CM | POA: Diagnosis not present

## 2023-07-20 DIAGNOSIS — Z7984 Long term (current) use of oral hypoglycemic drugs: Secondary | ICD-10-CM | POA: Diagnosis not present

## 2023-07-20 DIAGNOSIS — I081 Rheumatic disorders of both mitral and tricuspid valves: Secondary | ICD-10-CM | POA: Diagnosis not present

## 2023-07-20 DIAGNOSIS — D696 Thrombocytopenia, unspecified: Secondary | ICD-10-CM | POA: Diagnosis not present

## 2023-07-20 DIAGNOSIS — E785 Hyperlipidemia, unspecified: Secondary | ICD-10-CM | POA: Diagnosis not present

## 2023-07-20 DIAGNOSIS — I5020 Unspecified systolic (congestive) heart failure: Secondary | ICD-10-CM | POA: Diagnosis not present

## 2023-07-20 DIAGNOSIS — N281 Cyst of kidney, acquired: Secondary | ICD-10-CM | POA: Diagnosis not present

## 2023-07-20 DIAGNOSIS — I2510 Atherosclerotic heart disease of native coronary artery without angina pectoris: Secondary | ICD-10-CM | POA: Diagnosis not present

## 2023-07-20 DIAGNOSIS — Z7901 Long term (current) use of anticoagulants: Secondary | ICD-10-CM | POA: Diagnosis not present

## 2023-07-20 DIAGNOSIS — Z170 Estrogen receptor positive status [ER+]: Secondary | ICD-10-CM | POA: Diagnosis not present

## 2023-07-20 DIAGNOSIS — I110 Hypertensive heart disease with heart failure: Secondary | ICD-10-CM | POA: Diagnosis not present

## 2023-07-20 DIAGNOSIS — D649 Anemia, unspecified: Secondary | ICD-10-CM | POA: Diagnosis not present

## 2023-07-20 DIAGNOSIS — I4819 Other persistent atrial fibrillation: Secondary | ICD-10-CM | POA: Diagnosis not present

## 2023-07-20 DIAGNOSIS — Z951 Presence of aortocoronary bypass graft: Secondary | ICD-10-CM | POA: Diagnosis not present

## 2023-07-20 DIAGNOSIS — C50811 Malignant neoplasm of overlapping sites of right female breast: Secondary | ICD-10-CM | POA: Diagnosis not present

## 2023-07-20 DIAGNOSIS — M810 Age-related osteoporosis without current pathological fracture: Secondary | ICD-10-CM | POA: Diagnosis not present

## 2023-07-20 DIAGNOSIS — Z9181 History of falling: Secondary | ICD-10-CM | POA: Diagnosis not present

## 2023-07-20 DIAGNOSIS — M11261 Other chondrocalcinosis, right knee: Secondary | ICD-10-CM | POA: Diagnosis not present

## 2023-07-26 DIAGNOSIS — L03032 Cellulitis of left toe: Secondary | ICD-10-CM | POA: Diagnosis not present

## 2023-08-03 ENCOUNTER — Other Ambulatory Visit (INDEPENDENT_AMBULATORY_CARE_PROVIDER_SITE_OTHER): Payer: Medicare Other | Admitting: Internal Medicine

## 2023-08-03 ENCOUNTER — Encounter: Payer: Self-pay | Admitting: Internal Medicine

## 2023-08-03 DIAGNOSIS — I110 Hypertensive heart disease with heart failure: Secondary | ICD-10-CM | POA: Diagnosis not present

## 2023-08-03 DIAGNOSIS — C50811 Malignant neoplasm of overlapping sites of right female breast: Secondary | ICD-10-CM

## 2023-08-03 DIAGNOSIS — F0390 Unspecified dementia without behavioral disturbance: Secondary | ICD-10-CM

## 2023-08-03 DIAGNOSIS — I50810 Right heart failure, unspecified: Secondary | ICD-10-CM

## 2023-08-03 DIAGNOSIS — I2510 Atherosclerotic heart disease of native coronary artery without angina pectoris: Secondary | ICD-10-CM

## 2023-08-03 DIAGNOSIS — M11261 Other chondrocalcinosis, right knee: Secondary | ICD-10-CM

## 2023-08-03 DIAGNOSIS — E7800 Pure hypercholesterolemia, unspecified: Secondary | ICD-10-CM

## 2023-08-03 DIAGNOSIS — M810 Age-related osteoporosis without current pathological fracture: Secondary | ICD-10-CM

## 2023-08-03 DIAGNOSIS — Z853 Personal history of malignant neoplasm of breast: Secondary | ICD-10-CM

## 2023-08-03 DIAGNOSIS — I10 Essential (primary) hypertension: Secondary | ICD-10-CM

## 2023-08-03 DIAGNOSIS — C50911 Malignant neoplasm of unspecified site of right female breast: Secondary | ICD-10-CM

## 2023-08-03 DIAGNOSIS — I4819 Other persistent atrial fibrillation: Secondary | ICD-10-CM

## 2023-08-03 DIAGNOSIS — D6869 Other thrombophilia: Secondary | ICD-10-CM

## 2023-08-03 DIAGNOSIS — I5020 Unspecified systolic (congestive) heart failure: Secondary | ICD-10-CM

## 2023-08-03 NOTE — Progress Notes (Signed)
Received home health orders orders from [facility]. Start of care 05/14/23.   Certification and orders from 07/13/23 through 09/10/23 are reviewed, signed and faxed back to home health company.  Need of intermittent skilled services at home: home bound  The home health care plan has been established by me and will be reviewed and updated as needed to maximize patient recovery.  I certify that all home health services have been and will be furnished to the patient while under my care.  Face-to-face encounter in which the need for home health services was established: 06/06/23  Patient is receiving home health services for the following diagnoses: Problem List Items Addressed This Visit       Unprioritized   Senile dementia, uncomplicated (HCC) (Chronic)   Pseudogout of right knee (Chronic)   Personal history of malignant neoplasm of breast   Persistent atrial fibrillation (HCC) (Chronic)   Osteoporosis   Hypertensive heart disease with congestive heart failure (HCC) - Primary   Hypercholesteremia   Heart failure, systolic (HCC)   Essential (primary) hypertension (Chronic)   Chest wall recurrence of right breast cancer (HCC) (Chronic)   Carcinoma of overlapping sites of right breast in female, estrogen receptor positive (HCC)   Arteriosclerosis of coronary artery (Chronic)   Acquired thrombophilia (HCC) (Chronic)     Bari Edward, MD

## 2023-09-01 ENCOUNTER — Ambulatory Visit: Payer: Self-pay

## 2023-09-01 NOTE — Telephone Encounter (Signed)
Chief Complaint: Sore/ Scab Symptoms: raised dime size area with redness around the outer edge Frequency: Noticed a few days ago Pertinent Negatives: Patient denies pain, open area, bleeding Disposition: [] ED /[] Urgent Care (no appt availability in office) / [] Appointment(In office/virtual)/ []  [facility]/ [x] Home Care/ [] Refused Recommended Disposition /[] [facility]/ []  Follow-up with PCP Additional Notes: Spoke to patient's son Chip (John signed DPR on file). Chip stated the patient noticed a raised area the size of a dime that has redness around the outer edge. Patient denies pain at the site and Chip states it almost looks like a callous on the right arm between to wrist and elbow. Chip wants to know if this is something the patient needs to be seen in office for. Care advise given. Chip stated he will try to access patient's MyChart to send a picture of the area.  Advised I would forward message to PCP for additional recommendation. Summary: Scab Advice   Pts son is calling to ask ask about scab with redness. Should patient come in for an appt. Please Advise     Reason for Disposition  1 or 2 small sores  Answer Assessment - Initial Assessment Questions 1. APPEARANCE of SORES: "What do the sores look like?"     It looks like a callous on her arm  2. NUMBER: "How many sores are there?"     1  3. SIZE: "How big is the largest sore?"     a dime 4. LOCATION: "Where are the sores located?"     Right arm between the wrist and elbow 5. ONSET: "When did the sores begin?"     She noticed it a few days ago 6. TENDER: "Does it hurt when you touch it?"  (Scale 1-10; or mild, moderate, severe)      No 7. CAUSE: "What do you think is causing the sores?"     Not sure  8. OTHER SYMPTOMS: "Do you have any other symptoms?" (e.g., fever, new weakness)     Red around the outer edge  Protocols used: Sores-A-AH

## 2023-09-01 NOTE — Telephone Encounter (Signed)
Please review.  KP

## 2023-09-02 ENCOUNTER — Other Ambulatory Visit: Payer: Self-pay | Admitting: Internal Medicine

## 2023-09-02 NOTE — Telephone Encounter (Signed)
Medication Refill - Medication: losartan (COZAAR) 25 MG tablet [[id number]]   Has the patient contacted their pharmacy? Yes.    (Agent: If yes, when and what did the pharmacy advise?) Contact PCP no refills   Preferred Pharmacy (with phone number or street name): [id number] - [place], [place] - [address] [place] [address] [place] [address]   Has the patient been seen for an appointment in the last year OR does the patient have an upcoming appointment? Yes.    Agent: Please be advised that RX refills may take up to 3 business days. We ask that you follow-up with your pharmacy.

## 2023-09-05 ENCOUNTER — Ambulatory Visit (INDEPENDENT_AMBULATORY_CARE_PROVIDER_SITE_OTHER): Payer: Medicare Other | Admitting: Internal Medicine

## 2023-09-05 ENCOUNTER — Encounter: Payer: Self-pay | Admitting: Internal Medicine

## 2023-09-05 VITALS — BP 124/70 | HR 73 | Ht 60.0 in | Wt 105.0 lb

## 2023-09-05 DIAGNOSIS — L820 Inflamed seborrheic keratosis: Secondary | ICD-10-CM | POA: Diagnosis not present

## 2023-09-05 DIAGNOSIS — I779 Disorder of arteries and arterioles, unspecified: Secondary | ICD-10-CM

## 2023-09-05 DIAGNOSIS — F0390 Unspecified dementia without behavioral disturbance: Secondary | ICD-10-CM

## 2023-09-05 DIAGNOSIS — D6869 Other thrombophilia: Secondary | ICD-10-CM | POA: Diagnosis not present

## 2023-09-05 DIAGNOSIS — I10 Essential (primary) hypertension: Secondary | ICD-10-CM | POA: Diagnosis not present

## 2023-09-05 DIAGNOSIS — I4819 Other persistent atrial fibrillation: Secondary | ICD-10-CM

## 2023-09-05 MED ORDER — TRIAMCINOLONE ACETONIDE 0.1 % EX CREA
1.0000 | TOPICAL_CREAM | Freq: Two times a day (BID) | CUTANEOUS | 0 refills | Status: DC
Start: 2023-09-05 — End: 2024-03-20

## 2023-09-05 NOTE — Assessment & Plan Note (Signed)
Chronic Afib - some fatigue but doing fairly well On Eliquis bid without bleeding issues

## 2023-09-05 NOTE — Telephone Encounter (Signed)
Please schedule pt an appt.  KP

## 2023-09-05 NOTE — Progress Notes (Signed)
Date:  09/05/2023   Name:  Donna Santos   DOB:  07/26/1925   MRN:  [id number]   Chief Complaint: Blister (Right arm. Not painful. Grown in size. )  Rash This is a new (lesion on right forearm) problem. The problem has been gradually worsening since onset. The affected locations include the right arm. Rash characteristics: raised and tender. Associated symptoms include fatigue.  Heart Problem This is a chronic (Afib) problem. The problem occurs constantly. The problem has been unchanged. Associated symptoms include fatigue and a rash. Pertinent negatives include no chest pain or chills. Treatments tried: on beta blocker and Eliquis.   Her son was only aware of this skin lesion last week.  She does not recall any injury.  It has not been draining or bleeding.    Review of Systems  Constitutional:  Positive for fatigue. Negative for chills.  Respiratory:  Negative for chest tightness.   Cardiovascular:  Negative for chest pain and leg swelling.  Skin:  Positive for rash.     Lab Results  Component Value Date   NA 140 06/06/2023   K 4.9 06/06/2023   CO2 23 06/06/2023   GLUCOSE 108 (H) 06/06/2023   BUN 68 (H) 06/06/2023   CREATININE 1.20 (H) 06/06/2023   CALCIUM 10.0 06/06/2023   EGFR 41 (L) 06/06/2023   GFRNONAA >60 06/17/2021   Lab Results  Component Value Date   CHOL 261 (A) 07/24/2013   HDL 44 07/24/2013   LDLCALC 179 07/24/2013   TRIG 192 (A) 07/24/2013   Lab Results  Component Value Date   TSH 2.930 11/03/2022   No results found for: "HGBA1C" Lab Results  Component Value Date   WBC 6.1 09/03/2022   HGB 11.9 09/03/2022   HCT 36.4 09/03/2022   MCV 94 09/03/2022   PLT 297 09/03/2022   Lab Results  Component Value Date   ALT 12 06/06/2023   AST 15 06/06/2023   ALKPHOS 65 06/06/2023   BILITOT 0.3 06/06/2023   Lab Results  Component Value Date   VD25OH 46.0 05/02/2019     Patient Active Problem List   Diagnosis Date Noted   Heart failure, systolic  (HCC) 06/07/2023   Personal history of fall 06/07/2023   Hypertensive heart disease with congestive heart failure (HCC) 05/14/2023   Edema of both lower legs 09/03/2022   Acquired thrombophilia (HCC) 09/25/2020   Pseudogout of right knee 08/04/2020   Aortic atherosclerosis (HCC) 07/10/2020   Chest wall recurrence of right breast cancer (HCC) 11/16/2019   Osteoporosis 06/17/2019   Primary open angle glaucoma (POAG) of left eye, moderate stage 04/25/2018   Persistent atrial fibrillation (HCC) 12/28/2016   Carcinoma of overlapping sites of right breast in female, estrogen receptor positive (HCC) 12/21/2016   Senile dementia, uncomplicated (HCC) 04/12/2016   Low back pain 04/12/2016   Impaired renal function 05/09/2015   Arteriosclerosis of coronary artery 05/09/2015   Essential (primary) hypertension 05/09/2015   Personal history of malignant neoplasm of breast 05/09/2015   Muscle spasms of neck 05/09/2015   Arthritis of shoulder region, degenerative 05/09/2015   Hypercholesteremia 04/20/2012   Carotid artery disease (HCC) 05/28/2005    Allergies  Allergen Reactions   Ace Inhibitors     Other reaction(s): Angioedema   Brinzolamide-Brimonidine     Past Surgical History:  Procedure Laterality Date   ARTHROTOMY KNEE-INFEC W/EXPLOR/DRAIN/REMOV FB Right 02/2022   CAROTID ENDARTERECTOMY     CORONARY ARTERY BYPASS GRAFT  2006   MASS  EXCISION Right 08/28/2015   Procedure: EXCISION MASS; remove massive right chest wall;  Surgeon: Lattie Haw, MD;  Location: [facility] ORS;  Service: General;  Laterality: Right;   MASTECTOMY, RADICAL Right 2002   BREAST CA   TOTAL ABDOMINAL HYSTERECTOMY      Social History   Tobacco Use   Smoking status: Former    Current packs/day: 0.00    Average packs/day: 1.5 packs/day for 1.5 years (2.2 ttl pk-yrs)    Types: Cigarettes    Start date: 04/06/1951    Quit date: 10/04/1952    Years since quitting: 70.9   Smokeless tobacco: Never  Vaping Use    Vaping status: Never Used  Substance Use Topics   Alcohol use: No    Alcohol/week: 0.0 standard drinks of alcohol   Drug use: No     Medication list has been reviewed and updated.  Current Meds  Medication Sig   acetaminophen (TYLENOL) 500 MG tablet Take 500 mg by mouth every 6 (six) hours as needed.   apixaban (ELIQUIS) 2.5 MG TABS tablet TAKE 1 TABLET(2.5 MG) BY MOUTH TWICE DAILY   Calcium Carb-Cholecalciferol 600-800 MG-UNIT TABS Take 1 tablet by mouth daily. Reported on 12/25/2015   Cholecalciferol (VITAMIN D-3) 125 MCG (5000 UT) TABS Take 1 tablet by mouth daily.   furosemide (LASIX) 20 MG tablet Take 20 mg by mouth daily.   JARDIANCE 10 MG TABS tablet Take 10 mg by mouth daily.   latanoprost (XALATAN) 0.005 % ophthalmic solution INT 1 GTT INTO EACH EYE QHS.   losartan (COZAAR) 25 MG tablet Take 25 mg by mouth daily.   metoprolol succinate (TOPROL-XL) 50 MG 24 hr tablet TAKE 1 TABLET BY MOUTH EVERY NIGHT   nitroGLYCERIN (NITROSTAT) 0.4 MG SL tablet Place 0.4 mg under the tongue every 5 (five) minutes as needed for chest pain.   timolol (TIMOPTIC) 0.25 % ophthalmic solution INSTILL ONE DROP INTO BOTH EYES EVERY MORNING.   triamcinolone cream (KENALOG) 0.1 % Apply 1 Application topically 2 (two) times daily. To lesion on arm   TURMERIC PO Take 1 tablet by mouth daily.   [DISCONTINUED] Cranberry-Vitamin C-Probiotic (AZO CRANBERRY PO) Take by mouth in the morning and at bedtime.       09/05/2023    4:34 PM 06/06/2023    9:50 AM 11/03/2022    3:14 PM 09/03/2022    4:02 PM  GAD 7 : Generalized Anxiety Score  Nervous, Anxious, on Edge 0 0 0 0  Control/stop worrying 1 0 0 1  Worry too much - different things 0 0 0 0  Trouble relaxing 0 0 0 0  Restless 0 0 0 0  Easily annoyed or irritable 0 0 0 0  Afraid - awful might happen 0 0 0 0  Total GAD 7 Score 1 0 0 1  Anxiety Difficulty Not difficult at all Not difficult at all Not difficult at all Not difficult at all       09/05/2023     4:34 PM 06/06/2023    9:50 AM 11/03/2022    3:14 PM  Depression screen PHQ 2/9  Decreased Interest 0 0 0  Down, Depressed, Hopeless 0 0 0  PHQ - 2 Score 0 0 0  Altered sleeping 0 0 0  Tired, decreased energy 1 1 2   Change in appetite 1 0 0  Feeling bad or failure about yourself  0 0 0  Trouble concentrating 0 1 0  Moving slowly or fidgety/restless 0 1 0  Suicidal thoughts 0 0 0  PHQ-9 Score 2 3 2   Difficult doing work/chores Not difficult at all Not difficult at all Not difficult at all    BP Readings from Last 3 Encounters:  09/05/23 124/70  06/06/23 128/76  06/06/23 128/76    Physical Exam HENT:     Head: Normocephalic and atraumatic.  Cardiovascular:     Rate and Rhythm: Normal rate. Rhythm irregular.  Pulmonary:     Effort: Pulmonary effort is normal.     Breath sounds: Normal breath sounds. Rhonchi: elderly female in wheelchair.  Skin:    General: Skin is warm and dry.     Comments: Lesion on right forearm  Neurological:     Mental Status: She is alert.     Wt Readings from Last 3 Encounters:  09/05/23 105 lb (47.6 kg)  06/06/23 108 lb (49 kg)  06/06/23 108 lb (49 kg)    BP 124/70   Pulse 73   Ht 5' (1.524 m)   Wt 105 lb (47.6 kg)   SpO2 97%   BMI 20.51 kg/m   Assessment and Plan:  Problem List Items Addressed This Visit       Unprioritized   Acquired thrombophilia (HCC) (Chronic)   Essential (primary) hypertension (Chronic)    Normal exam with stable BP on losartan. No concerns or side effects to current medication. No change in regimen; continue low sodium diet.       Relevant Medications   furosemide (LASIX) 20 MG tablet   spironolactone (ALDACTONE) 25 MG tablet   Persistent atrial fibrillation (HCC) (Chronic)    Chronic Afib - some fatigue but doing fairly well On Eliquis bid without bleeding issues      Relevant Medications   furosemide (LASIX) 20 MG tablet   spironolactone (ALDACTONE) 25 MG tablet   Senile dementia, uncomplicated  (HCC) (Chronic)   Other Visit Diagnoses     Inflamed seborrheic keratosis    -  Primary   apply TAC bid.  monitor for worsening Son will send a picture through MyChart laster this week   Relevant Medications   triamcinolone cream (KENALOG) 0.1 %   Carotid artery disease, unspecified laterality (HCC)   (Chronic)     Relevant Medications   furosemide (LASIX) 20 MG tablet   spironolactone (ALDACTONE) 25 MG tablet       No follow-ups on file.    Reubin Milan, MD [facility] [place]

## 2023-09-05 NOTE — Assessment & Plan Note (Signed)
Normal exam with stable BP on losartan. No concerns or side effects to current medication. No change in regimen; continue low sodium diet.

## 2023-09-05 NOTE — Telephone Encounter (Signed)
Requested medications are due for refill today.  unsure  Requested medications are on the active medications list.  yes  Last refill. 06/06/2023  Future visit scheduled.   yes  Notes to clinic.  Medication is historical.    Requested Prescriptions  Pending Prescriptions Disp Refills   losartan (COZAAR) 25 MG tablet 90 tablet 0    Sig: Take 1 tablet (25 mg total) by mouth daily.     Cardiovascular:  Angiotensin Receptor Blockers Failed - 09/02/2023  5:28 PM      Failed - Cr in normal range and within 180 days    Creatinine, Ser  Date Value Ref Range Status  06/06/2023 1.20 (H) 0.57 - 1.00 mg/dL Final    Comment:    **Verified by repeat analysis**         Passed - K in normal range and within 180 days    Potassium  Date Value Ref Range Status  06/06/2023 4.9 3.5 - 5.2 mmol/L Final         Passed - Patient is not pregnant      Passed - Last BP in normal range    BP Readings from Last 1 Encounters:  06/06/23 128/76         Passed - Valid encounter within last 6 months    Recent Outpatient Visits           3 months ago Essential (primary) hypertension   [place] [facility] at [facility], Nyoka Cowden, MD   10 months ago Essential (primary) hypertension   [place] [facility] at [facility], Nyoka Cowden, MD   1 year ago Edema of both lower legs   [name] [facility] at [facility], Nyoka Cowden, MD   1 year ago Cellulitis of left lower extremity   [place] [facility] at [facility], Nyoka Cowden, MD   1 year ago Confusion   [facility] at [facility], Nyoka Cowden, MD       Future Appointments             Today Reubin Milan, MD [facility] at [facility], [facility]   In 1 month Judithann Graves, Nyoka Cowden, MD [facility] Medicine at [facility],  [facility]

## 2023-09-06 MED ORDER — LOSARTAN POTASSIUM 25 MG PO TABS: 25.0000 mg | ORAL_TABLET | Freq: Every day | ORAL | 0 refills | Status: DC

## 2023-10-13 ENCOUNTER — Encounter: Payer: Self-pay | Admitting: Internal Medicine

## 2023-10-13 ENCOUNTER — Ambulatory Visit (INDEPENDENT_AMBULATORY_CARE_PROVIDER_SITE_OTHER): Payer: Medicare Other | Admitting: Internal Medicine

## 2023-10-13 VITALS — BP 118/74 | HR 63 | Ht 60.0 in | Wt 103.4 lb

## 2023-10-13 DIAGNOSIS — I50810 Right heart failure, unspecified: Secondary | ICD-10-CM | POA: Diagnosis not present

## 2023-10-13 DIAGNOSIS — L820 Inflamed seborrheic keratosis: Secondary | ICD-10-CM

## 2023-10-13 DIAGNOSIS — Z853 Personal history of malignant neoplasm of breast: Secondary | ICD-10-CM

## 2023-10-13 DIAGNOSIS — I10 Essential (primary) hypertension: Secondary | ICD-10-CM

## 2023-10-13 DIAGNOSIS — I110 Hypertensive heart disease with heart failure: Secondary | ICD-10-CM

## 2023-10-13 NOTE — Assessment & Plan Note (Signed)
Controlled BP with normal exam. Current regimen is losartan, spironolactone and metoprolol. Will continue same medications; encourage continued reduced sodium diet.

## 2023-10-13 NOTE — Assessment & Plan Note (Signed)
Stable without shortness of breath or chest pain. On Jardiance for heart failure with reduced EF

## 2023-10-13 NOTE — Assessment & Plan Note (Signed)
Completed Tamoxifen course Has not seen Oncology in several years and has no chest or breast concerns

## 2023-10-13 NOTE — Progress Notes (Signed)
Date:  10/13/2023   Name:  Donna Santos   DOB:  1925-07-22   MRN:  [id number]   Chief Complaint: Hypertension  Hypertension This is a chronic problem. The problem is controlled. Pertinent negatives include no chest pain, headaches, palpitations or shortness of breath. Past treatments include angiotensin blockers, beta blockers and diuretics.   Skin lesion - appeared to be inflamed keratosis and is now much better.  The lesion is loose around the edges and no longer red.  She is asymptomatic.  She continues to live at home with family and neighbor support.  One fall without injury.  Gets meals on wheels and drinks Ensure or similar several times per day.  Son monitors her weights and BP and these are stable.  No new concerns.  Review of Systems  Constitutional:  Negative for fatigue and unexpected weight change.  HENT:  Negative for nosebleeds.   Eyes:  Negative for visual disturbance.  Respiratory:  Negative for cough, chest tightness, shortness of breath and wheezing.   Cardiovascular:  Negative for chest pain, palpitations and leg swelling.  Gastrointestinal:  Negative for abdominal pain, constipation and diarrhea.  Neurological:  Negative for dizziness, weakness, light-headedness and headaches.  Psychiatric/Behavioral:  Negative for dysphoric mood and sleep disturbance. The patient is not nervous/anxious.      Lab Results  Component Value Date   NA 140 06/06/2023   K 4.9 06/06/2023   CO2 23 06/06/2023   GLUCOSE 108 (H) 06/06/2023   BUN 68 (H) 06/06/2023   CREATININE 1.20 (H) 06/06/2023   CALCIUM 10.0 06/06/2023   EGFR 41 (L) 06/06/2023   GFRNONAA >60 06/17/2021   Lab Results  Component Value Date   CHOL 261 (A) 07/24/2013   HDL 44 07/24/2013   LDLCALC 179 07/24/2013   TRIG 192 (A) 07/24/2013   Lab Results  Component Value Date   TSH 2.930 11/03/2022   No results found for: "HGBA1C" Lab Results  Component Value Date   WBC 6.1 09/03/2022   HGB 11.9  09/03/2022   HCT 36.4 09/03/2022   MCV 94 09/03/2022   PLT 297 09/03/2022   Lab Results  Component Value Date   ALT 12 06/06/2023   AST 15 06/06/2023   ALKPHOS 65 06/06/2023   BILITOT 0.3 06/06/2023   Lab Results  Component Value Date   VD25OH 46.0 05/02/2019     Patient Active Problem List   Diagnosis Date Noted   Heart failure, systolic (HCC) 06/07/2023   Personal history of fall 06/07/2023   Hypertensive heart disease with congestive heart failure (HCC) 05/14/2023   Edema of both lower legs 09/03/2022   Acquired thrombophilia (HCC) 09/25/2020   Pseudogout of right knee 08/04/2020   Aortic atherosclerosis (HCC) 07/10/2020   Osteoporosis 06/17/2019   Primary open angle glaucoma (POAG) of left eye, moderate stage 04/25/2018   Persistent atrial fibrillation (HCC) 12/28/2016   Senile dementia, uncomplicated (HCC) 04/12/2016   Low back pain 04/12/2016   Impaired renal function 05/09/2015   Arteriosclerosis of coronary artery 05/09/2015   Essential (primary) hypertension 05/09/2015   Personal history of malignant neoplasm of breast 05/09/2015   Muscle spasms of neck 05/09/2015   Arthritis of shoulder region, degenerative 05/09/2015   Hypercholesteremia 04/20/2012   Carotid artery disease (HCC) 05/28/2005    Allergies  Allergen Reactions   Ace Inhibitors     Other reaction(s): Angioedema   Brinzolamide-Brimonidine     Past Surgical History:  Procedure Laterality Date   ARTHROTOMY KNEE-INFEC  W/EXPLOR/DRAIN/REMOV FB Right 02/2022   CAROTID ENDARTERECTOMY     CORONARY ARTERY BYPASS GRAFT  2006   MASS EXCISION Right 08/28/2015   Procedure: EXCISION MASS; remove massive right chest wall;  Surgeon: Lattie Haw, MD;  Location: [facility] ORS;  Service: General;  Laterality: Right;   MASTECTOMY, RADICAL Right 2002   BREAST CA   TOTAL ABDOMINAL HYSTERECTOMY      Social History   Tobacco Use   Smoking status: Former    Current packs/day: 0.00    Average packs/day: 1.5  packs/day for 1.5 years (2.2 ttl pk-yrs)    Types: Cigarettes    Start date: 04/06/1951    Quit date: 10/04/1952    Years since quitting: 71.0   Smokeless tobacco: Never  Vaping Use   Vaping status: Never Used  Substance Use Topics   Alcohol use: No    Alcohol/week: 0.0 standard drinks of alcohol   Drug use: No     Medication list has been reviewed and updated.  Current Meds  Medication Sig   acetaminophen (TYLENOL) 500 MG tablet Take 500 mg by mouth every 6 (six) hours as needed.   apixaban (ELIQUIS) 2.5 MG TABS tablet TAKE 1 TABLET(2.5 MG) BY MOUTH TWICE DAILY   Calcium Carb-Cholecalciferol 600-800 MG-UNIT TABS Take 1 tablet by mouth daily. Reported on 12/25/2015   Cholecalciferol (VITAMIN D-3) 125 MCG (5000 UT) TABS Take 1 tablet by mouth daily.   furosemide (LASIX) 20 MG tablet Take 20 mg by mouth daily.   JARDIANCE 10 MG TABS tablet Take 10 mg by mouth daily.   latanoprost (XALATAN) 0.005 % ophthalmic solution INT 1 GTT INTO EACH EYE QHS.   losartan (COZAAR) 25 MG tablet Take 1 tablet (25 mg total) by mouth daily.   metoprolol succinate (TOPROL-XL) 50 MG 24 hr tablet TAKE 1 TABLET BY MOUTH EVERY NIGHT   nitroGLYCERIN (NITROSTAT) 0.4 MG SL tablet Place 0.4 mg under the tongue every 5 (five) minutes as needed for chest pain.   spironolactone (ALDACTONE) 25 MG tablet Take 25 mg by mouth daily.   timolol (TIMOPTIC) 0.25 % ophthalmic solution INSTILL ONE DROP INTO BOTH EYES EVERY MORNING.   triamcinolone cream (KENALOG) 0.1 % Apply 1 Application topically 2 (two) times daily. To lesion on arm   TURMERIC PO Take 1 tablet by mouth daily.       10/13/2023   11:20 AM 09/05/2023    4:34 PM 06/06/2023    9:50 AM 11/03/2022    3:14 PM  GAD 7 : Generalized Anxiety Score  Nervous, Anxious, on Edge 0 0 0 0  Control/stop worrying 0 1 0 0  Worry too much - different things 0 0 0 0  Trouble relaxing 0 0 0 0  Restless 0 0 0 0  Easily annoyed or irritable 0 0 0 0  Afraid - awful might happen  0 0 0 0  Total GAD 7 Score 0 1 0 0  Anxiety Difficulty Not difficult at all Not difficult at all Not difficult at all Not difficult at all       10/13/2023   11:20 AM 09/05/2023    4:34 PM 06/06/2023    9:50 AM  Depression screen PHQ 2/9  Decreased Interest 0 0 0  Down, Depressed, Hopeless 0 0 0  PHQ - 2 Score 0 0 0  Altered sleeping 0 0 0  Tired, decreased energy 1 1 1   Change in appetite 0 1 0  Feeling bad or failure about yourself  0 0 0  Trouble concentrating 1 0 1  Moving slowly or fidgety/restless 0 0 1  Suicidal thoughts 0 0 0  PHQ-9 Score 2 2 3   Difficult doing work/chores Not difficult at all Not difficult at all Not difficult at all    BP Readings from Last 3 Encounters:  10/13/23 118/74  09/05/23 124/70  06/06/23 128/76    Physical Exam Constitutional:      Appearance: Normal appearance.  Cardiovascular:     Rate and Rhythm: Normal rate and regular rhythm.     Heart sounds: No murmur heard. Pulmonary:     Effort: Pulmonary effort is normal. No respiratory distress.     Breath sounds: No wheezing or rhonchi.  Musculoskeletal:     Cervical back: Normal range of motion.     Right lower leg: No edema.     Left lower leg: No edema.  Lymphadenopathy:     Cervical: No cervical adenopathy.  Skin:    General: Skin is warm.     Findings: Lesion (resolving right forearm lesion -) present.  Neurological:     Mental Status: She is alert.     Gait: Gait abnormal (in wheel chair).     Wt Readings from Last 3 Encounters:  10/13/23 103 lb 6.4 oz (46.9 kg)  09/05/23 105 lb (47.6 kg)  06/06/23 108 lb (49 kg)    BP 118/74   Pulse 63   Ht 5' (1.524 m)   Wt 103 lb 6.4 oz (46.9 kg)   SpO2 97%   BMI 20.19 kg/m   Assessment and Plan:  Problem List Items Addressed This Visit       Unprioritized   Essential (primary) hypertension (Chronic)    Controlled BP with normal exam. Current regimen is losartan, spironolactone and metoprolol. Will continue same  medications; encourage continued reduced sodium diet.       Personal history of malignant neoplasm of breast    Completed Tamoxifen course Has not seen Oncology in several years and has no chest or breast concerns      Hypertensive heart disease with congestive heart failure (HCC)    Stable without shortness of breath or chest pain. On Jardiance for heart failure with reduced EF      Other Visit Diagnoses     Inflamed seborrheic keratosis    -  Primary   much improved with topical care       Return in about 4 months (around 02/10/2024) for HTN.    Reubin Milan, MD [facility] [place]

## 2023-12-17 ENCOUNTER — Other Ambulatory Visit: Payer: Self-pay | Admitting: Internal Medicine

## 2023-12-19 NOTE — Telephone Encounter (Signed)
Requested medication (s) are due for refill today: yes  Requested medication (s) are on the active medication list: yes  Last refill:  09/06/23 #90/0  Future visit scheduled: yes  Notes to clinic:  Unable to refill per protocol due to failed labs, no updated results.      Requested Prescriptions  Pending Prescriptions Disp Refills   losartan (COZAAR) 25 MG tablet [Pharmacy Med Name: LOSARTAN 25MG  TABLETS] 90 tablet 0    Sig: TAKE 1 TABLET(25 MG) BY MOUTH DAILY     Cardiovascular:  Angiotensin Receptor Blockers Failed - 12/19/2023 12:03 PM      Failed - Cr in normal range and within 180 days    Creatinine, Ser  Date Value Ref Range Status  06/06/2023 1.20 (H) 0.57 - 1.00 mg/dL Final    Comment:    **Verified by repeat analysis**         Failed - K in normal range and within 180 days    Potassium  Date Value Ref Range Status  06/06/2023 4.9 3.5 - 5.2 mmol/L Final         Passed - Patient is not pregnant      Passed - Last BP in normal range    BP Readings from Last 1 Encounters:  10/13/23 118/74         Passed - Valid encounter within last 6 months    Recent Outpatient Visits           2 months ago Inflamed seborrheic keratosis   [place] [facility] at [facility], Nyoka Cowden, MD   3 months ago Inflamed seborrheic keratosis   [place] [facility] at [facility], Nyoka Cowden, MD   6 months ago Essential (primary) hypertension   [place] [facility] at [facility], Nyoka Cowden, MD   1 year ago Essential (primary) hypertension   [place] [facility] at [facility] Nyoka Cowden, MD   1 year ago Edema of both lower legs   [facility] at [facility], Nyoka Cowden, MD       Future Appointments             In 1 month Judithann Graves, Nyoka Cowden, MD [facility] Medicine at [facility],  [facility]

## 2024-01-27 ENCOUNTER — Ambulatory Visit (INDEPENDENT_AMBULATORY_CARE_PROVIDER_SITE_OTHER): Payer: Medicare Other | Admitting: Internal Medicine

## 2024-01-27 ENCOUNTER — Encounter: Payer: Self-pay | Admitting: Internal Medicine

## 2024-01-27 VITALS — BP 128/68 | HR 98 | Ht 60.0 in | Wt 105.0 lb

## 2024-01-27 DIAGNOSIS — I50810 Right heart failure, unspecified: Secondary | ICD-10-CM | POA: Diagnosis not present

## 2024-01-27 DIAGNOSIS — I110 Hypertensive heart disease with heart failure: Secondary | ICD-10-CM

## 2024-01-27 DIAGNOSIS — I4819 Other persistent atrial fibrillation: Secondary | ICD-10-CM

## 2024-01-27 DIAGNOSIS — N289 Disorder of kidney and ureter, unspecified: Secondary | ICD-10-CM

## 2024-01-27 DIAGNOSIS — F0390 Unspecified dementia without behavioral disturbance: Secondary | ICD-10-CM

## 2024-01-27 DIAGNOSIS — I10 Essential (primary) hypertension: Secondary | ICD-10-CM | POA: Diagnosis not present

## 2024-01-27 NOTE — Assessment & Plan Note (Signed)
 Controlled BP with normal exam. Current regimen is losartan, metoprolol, Lasix and spironolactone. Will continue same medications; encourage continued reduced sodium diet.

## 2024-01-27 NOTE — Assessment & Plan Note (Signed)
 She continues to live at home with extensive family support. She gets meals on wheels and ambulates with a walker. She takes her medication as directed - family oversees this.

## 2024-01-27 NOTE — Assessment & Plan Note (Signed)
 Rate controlled No bleeding issues on Eliquis

## 2024-01-27 NOTE — Progress Notes (Signed)
 Date:  01/27/2024   Name:  Donna Santos   DOB:  [birth year]   MRN:  [id number]   Chief Complaint: Hypertension  Hypertension This is a chronic problem. The problem is controlled. Pertinent negatives include no chest pain, headaches or shortness of breath. Past treatments include angiotensin blockers, beta blockers and diuretics. The current treatment provides significant improvement. There are no compliance problems.  Hypertensive end-organ damage includes kidney disease and CAD/MI. There is no history of CVA.    Review of Systems  Constitutional:  Negative for chills, fatigue and fever.  HENT:  Negative for trouble swallowing.   Respiratory:  Negative for chest tightness and shortness of breath.   Cardiovascular:  Negative for chest pain and leg swelling.  Gastrointestinal:  Negative for abdominal pain, constipation and diarrhea.  Musculoskeletal:  Positive for arthralgias, back pain and gait problem.  Neurological:  Negative for dizziness and headaches.  Psychiatric/Behavioral:  Negative for dysphoric mood and sleep disturbance. The patient is not nervous/anxious.      Lab Results  Component Value Date   NA 140 06/06/2023   K 4.9 06/06/2023   CO2 23 06/06/2023   GLUCOSE 108 (H) 06/06/2023   BUN 68 (H) 06/06/2023   CREATININE 1.20 (H) 06/06/2023   CALCIUM 10.0 06/06/2023   EGFR 41 (L) 06/06/2023   GFRNONAA >60 06/17/2021   Lab Results  Component Value Date   CHOL 261 (A) 07/24/2013   HDL 44 07/24/2013   LDLCALC 179 07/24/2013   TRIG 192 (A) 07/24/2013   Lab Results  Component Value Date   TSH 2.930 11/03/2022   No results found for: "HGBA1C" Lab Results  Component Value Date   WBC 6.1 09/03/2022   HGB 11.9 09/03/2022   HCT 36.4 09/03/2022   MCV 94 09/03/2022   PLT 297 09/03/2022   Lab Results  Component Value Date   ALT 12 06/06/2023   AST 15 06/06/2023   ALKPHOS 65 06/06/2023   BILITOT 0.3 06/06/2023   Lab Results  Component Value Date   VD25OH  46.0 05/02/2019     Patient Active Problem List   Diagnosis Date Noted   Heart failure, systolic (HCC) 06/07/2023   Personal history of fall 06/07/2023   Hypertensive heart disease with congestive heart failure (HCC) 05/14/2023   Edema of both lower legs 09/03/2022   Acquired thrombophilia (HCC) 09/25/2020   Pseudogout of right knee 08/04/2020   Aortic atherosclerosis (HCC) 07/10/2020   Osteoporosis 06/17/2019   Primary open angle glaucoma (POAG) of left eye, moderate stage 04/25/2018   Persistent atrial fibrillation (HCC) 12/28/2016   Senile dementia, uncomplicated (HCC) 04/12/2016   Low back pain 04/12/2016   Impaired renal function 05/09/2015   Arteriosclerosis of coronary artery 05/09/2015   Essential (primary) hypertension 05/09/2015   Personal history of malignant neoplasm of breast 05/09/2015   Muscle spasms of neck 05/09/2015   Arthritis of shoulder region, degenerative 05/09/2015   Hypercholesteremia 04/20/2012   Carotid artery disease (HCC) 05/28/2005    Allergies  Allergen Reactions   Ace Inhibitors     Other reaction(s): Angioedema   Brinzolamide-Brimonidine     Past Surgical History:  Procedure Laterality Date   ARTHROTOMY KNEE-INFEC W/EXPLOR/DRAIN/REMOV FB Right 02/2022   CAROTID ENDARTERECTOMY     CORONARY ARTERY BYPASS GRAFT  2006   MASS EXCISION Right 08/28/2015   Procedure: EXCISION MASS; remove massive right chest wall;  Surgeon: Lattie Haw, MD;  Location: [facility] ORS;  Service: General;  Laterality: Right;  MASTECTOMY, RADICAL Right 2002   BREAST CA   TOTAL ABDOMINAL HYSTERECTOMY      Social History   Tobacco Use   Smoking status: Former    Current packs/day: 0.00    Average packs/day: 1.5 packs/day for 1.5 years (2.2 ttl pk-yrs)    Types: Cigarettes    Start date: 04/06/1951    Quit date: 10/04/1952    Years since quitting: 71.3   Smokeless tobacco: Never  Vaping Use   Vaping status: Never Used  Substance Use Topics   Alcohol use: No     Alcohol/week: 0.0 standard drinks of alcohol   Drug use: No     Medication list has been reviewed and updated.  Current Meds  Medication Sig   acetaminophen (TYLENOL) 500 MG tablet Take 500 mg by mouth every 6 (six) hours as needed.   apixaban (ELIQUIS) 2.5 MG TABS tablet TAKE 1 TABLET(2.5 MG) BY MOUTH TWICE DAILY   Calcium Carb-Cholecalciferol 600-800 MG-UNIT TABS Take 1 tablet by mouth daily. Reported on 12/25/2015   Cholecalciferol (VITAMIN D-3) 125 MCG (5000 UT) TABS Take 1 tablet by mouth daily.   furosemide (LASIX) 20 MG tablet Take 20 mg by mouth daily.   JARDIANCE 10 MG TABS tablet Take 10 mg by mouth daily.   latanoprost (XALATAN) 0.005 % ophthalmic solution INT 1 GTT INTO EACH EYE QHS.   losartan (COZAAR) 25 MG tablet TAKE 1 TABLET(25 MG) BY MOUTH DAILY   metoprolol succinate (TOPROL-XL) 50 MG 24 hr tablet TAKE 1 TABLET BY MOUTH EVERY NIGHT   nitroGLYCERIN (NITROSTAT) 0.4 MG SL tablet Place 0.4 mg under the tongue every 5 (five) minutes as needed for chest pain.   spironolactone (ALDACTONE) 25 MG tablet Take 25 mg by mouth daily.   timolol (TIMOPTIC) 0.25 % ophthalmic solution INSTILL ONE DROP INTO BOTH EYES EVERY MORNING.   triamcinolone cream (KENALOG) 0.1 % Apply 1 Application topically 2 (two) times daily. To lesion on arm   TURMERIC PO Take 1 tablet by mouth daily.       01/27/2024    3:41 PM 10/13/2023   11:20 AM 09/05/2023    4:34 PM 06/06/2023    9:50 AM  GAD 7 : Generalized Anxiety Score  Nervous, Anxious, on Edge 0 0 0 0  Control/stop worrying 0 0 1 0  Worry too much - different things 0 0 0 0  Trouble relaxing 0 0 0 0  Restless 0 0 0 0  Easily annoyed or irritable 0 0 0 0  Afraid - awful might happen 0 0 0 0  Total GAD 7 Score 0 0 1 0  Anxiety Difficulty Not difficult at all Not difficult at all Not difficult at all Not difficult at all       01/27/2024    3:41 PM 10/13/2023   11:20 AM 09/05/2023    4:34 PM  Depression screen PHQ 2/9  Decreased  Interest 0 0 0  Down, Depressed, Hopeless 0 0 0  PHQ - 2 Score 0 0 0  Altered sleeping 0 0 0  Tired, decreased energy 1 1 1   Change in appetite 1 0 1  Feeling bad or failure about yourself  0 0 0  Trouble concentrating 1 1 0  Moving slowly or fidgety/restless 0 0 0  Suicidal thoughts 0 0 0  PHQ-9 Score 3 2 2   Difficult doing work/chores Not difficult at all Not difficult at all Not difficult at all    BP Readings from Last 3  Encounters:  01/27/24 128/68  10/13/23 118/74  09/05/23 124/70    Physical Exam Neck:     Vascular: No carotid bruit.  Cardiovascular:     Rate and Rhythm: Normal rate. Rhythm irregular.     Heart sounds: No murmur heard. Pulmonary:     Effort: Pulmonary effort is normal.     Breath sounds: No wheezing or rhonchi.  Abdominal:     General: Abdomen is flat.     Palpations: Abdomen is soft.     Tenderness: There is no abdominal tenderness.  Musculoskeletal:     Cervical back: Normal range of motion.     Right lower leg: No edema.     Left lower leg: No edema.  Lymphadenopathy:     Cervical: No cervical adenopathy.  Skin:    General: Skin is warm and dry.  Neurological:     Mental Status: She is alert. Mental status is at baseline.     Gait: Gait abnormal (in wheelchair due to distance; uses walker at home).     Wt Readings from Last 3 Encounters:  01/27/24 105 lb (47.6 kg)  10/13/23 103 lb 6.4 oz (46.9 kg)  09/05/23 105 lb (47.6 kg)    BP 128/68   Pulse 98   Ht 5' (1.524 m)   Wt 105 lb (47.6 kg)   SpO2 98%   BMI 20.51 kg/m   Assessment and Plan:  Problem List Items Addressed This Visit       Unprioritized   Impaired renal function (Chronic)   Monitoring at intervals      Relevant Orders   Comprehensive metabolic panel   Essential (primary) hypertension - Primary (Chronic)   Controlled BP with normal exam. Current regimen is losartan, metoprolol, Lasix and spironolactone. Will continue same medications; encourage continued  reduced sodium diet.       Relevant Orders   CBC with Differential/Platelet   Comprehensive metabolic panel   Senile dementia, uncomplicated (HCC) (Chronic)   She continues to live at home with extensive family support. She gets meals on wheels and ambulates with a walker. She takes her medication as directed - family oversees this.       Persistent atrial fibrillation (HCC) (Chronic)   Rate controlled.  No bleeding issues on Eliquis.      Relevant Orders   CBC with Differential/Platelet   Hypertensive heart disease with congestive heart failure (HCC)   She is due for follow up with Cardiology. She is on losartan and Jardiance. She is very sedentary but denies chest pain or shortness of breath        Return in about 6 months (around 07/26/2024) for HTN.    Reubin Milan, MD [facility] [place]

## 2024-01-27 NOTE — Assessment & Plan Note (Signed)
 Monitoring at intervals

## 2024-01-27 NOTE — Assessment & Plan Note (Signed)
 She is due for follow up with Cardiology. She is on losartan and Jardiance. She is very sedentary but denies chest pain or shortness of breath

## 2024-01-27 NOTE — Patient Instructions (Signed)
Schedule Cardiology follow-up.

## 2024-01-28 ENCOUNTER — Other Ambulatory Visit: Payer: Self-pay | Admitting: Internal Medicine

## 2024-01-28 LAB — CBC WITH DIFFERENTIAL/PLATELET
Basophils Absolute: 0.1 10*3/uL (ref 0.0–0.2)
Basos: 1 %
EOS (ABSOLUTE): 0.1 10*3/uL (ref 0.0–0.4)
Eos: 1 %
Hematocrit: 42 % (ref 34.0–46.6)
Hemoglobin: 14.1 g/dL (ref 11.1–15.9)
Immature Grans (Abs): 0 10*3/uL (ref 0.0–0.1)
Immature Granulocytes: 0 %
Lymphocytes Absolute: 0.8 10*3/uL (ref 0.7–3.1)
Lymphs: 10 %
MCH: 33.4 pg — ABNORMAL HIGH (ref 26.6–33.0)
MCHC: 33.6 g/dL (ref 31.5–35.7)
MCV: 100 fL — ABNORMAL HIGH (ref 79–97)
Monocytes Absolute: 0.9 10*3/uL (ref 0.1–0.9)
Monocytes: 11 %
Neutrophils Absolute: 5.9 10*3/uL (ref 1.4–7.0)
Neutrophils: 77 %
Platelets: 267 10*3/uL (ref 150–450)
RBC: 4.22 x10E6/uL (ref 3.77–5.28)
RDW: 12 % (ref 11.7–15.4)
WBC: 7.8 10*3/uL (ref 3.4–10.8)

## 2024-01-28 LAB — COMPREHENSIVE METABOLIC PANEL
ALT: 39 IU/L — ABNORMAL HIGH (ref 0–32)
AST: 34 IU/L (ref 0–40)
Albumin: 4.1 g/dL (ref 3.6–4.6)
Alkaline Phosphatase: 47 IU/L (ref 44–121)
BUN/Creatinine Ratio: 48 — ABNORMAL HIGH (ref 12–28)
BUN: 65 mg/dL — ABNORMAL HIGH (ref 10–36)
Bilirubin Total: 0.3 mg/dL (ref 0.0–1.2)
CO2: 24 mmol/L (ref 20–29)
Calcium: 9.5 mg/dL (ref 8.7–10.3)
Chloride: 96 mmol/L (ref 96–106)
Creatinine, Ser: 1.35 mg/dL — ABNORMAL HIGH (ref 0.57–1.00)
Globulin, Total: 2.4 g/dL (ref 1.5–4.5)
Glucose: 108 mg/dL — ABNORMAL HIGH (ref 70–99)
Potassium: 5.5 mmol/L — ABNORMAL HIGH (ref 3.5–5.2)
Sodium: 135 mmol/L (ref 134–144)
Total Protein: 6.5 g/dL (ref 6.0–8.5)
eGFR: 36 mL/min/{1.73_m2} — ABNORMAL LOW (ref >59)

## 2024-01-29 ENCOUNTER — Encounter: Payer: Self-pay | Admitting: Internal Medicine

## 2024-01-30 NOTE — Telephone Encounter (Signed)
 Requested Prescriptions  Pending Prescriptions Disp Refills   metoprolol succinate (TOPROL-XL) 50 MG 24 hr tablet [Pharmacy Med Name: METOPROLOL ER SUCCINATE 50MG  TABS] 90 tablet 0    Sig: TAKE 1 TABLET BY MOUTH EVERY NIGHT     Cardiovascular:  Beta Blockers Passed - 01/30/2024  4:01 PM      Passed - Last BP in normal range    BP Readings from Last 1 Encounters:  01/27/24 128/68         Passed - Last Heart Rate in normal range    Pulse Readings from Last 1 Encounters:  01/27/24 98         Passed - Valid encounter within last 6 months    Recent Outpatient Visits           3 months ago Inflamed seborrheic keratosis   [place] [facility], Nyoka Cowden, MD   4 months ago Inflamed seborrheic keratosis   [place] [facility], Nyoka Cowden, MD   7 months ago Essential (primary) hypertension   [place] [facility] at [facility], Nyoka Cowden, MD   1 year ago Essential (primary) hypertension   [place] [facility] at [facility], Nyoka Cowden, MD   1 year ago Edema of both lower legs   [facility] at [facility], Nyoka Cowden, MD       Future Appointments             In 5 months Judithann Graves, Nyoka Cowden, MD [facility] at [facility], [facility]

## 2024-03-13 DIAGNOSIS — M810 Age-related osteoporosis without current pathological fracture: Secondary | ICD-10-CM | POA: Diagnosis not present

## 2024-03-13 DIAGNOSIS — R531 Weakness: Secondary | ICD-10-CM | POA: Diagnosis not present

## 2024-03-13 DIAGNOSIS — M25569 Pain in unspecified knee: Secondary | ICD-10-CM | POA: Diagnosis not present

## 2024-03-13 DIAGNOSIS — E673 Hypervitaminosis D: Secondary | ICD-10-CM | POA: Diagnosis not present

## 2024-03-13 DIAGNOSIS — I5020 Unspecified systolic (congestive) heart failure: Secondary | ICD-10-CM | POA: Diagnosis not present

## 2024-03-13 DIAGNOSIS — I110 Hypertensive heart disease with heart failure: Secondary | ICD-10-CM | POA: Diagnosis not present

## 2024-03-13 DIAGNOSIS — I5022 Chronic systolic (congestive) heart failure: Secondary | ICD-10-CM | POA: Diagnosis not present

## 2024-03-13 DIAGNOSIS — I2510 Atherosclerotic heart disease of native coronary artery without angina pectoris: Secondary | ICD-10-CM | POA: Diagnosis not present

## 2024-03-13 DIAGNOSIS — M25512 Pain in left shoulder: Secondary | ICD-10-CM | POA: Diagnosis not present

## 2024-03-13 DIAGNOSIS — I469 Cardiac arrest, cause unspecified: Secondary | ICD-10-CM | POA: Diagnosis not present

## 2024-03-13 DIAGNOSIS — M542 Cervicalgia: Secondary | ICD-10-CM | POA: Diagnosis not present

## 2024-03-13 DIAGNOSIS — R590 Localized enlarged lymph nodes: Secondary | ICD-10-CM | POA: Diagnosis not present

## 2024-03-13 DIAGNOSIS — I4819 Other persistent atrial fibrillation: Secondary | ICD-10-CM | POA: Diagnosis not present

## 2024-03-13 DIAGNOSIS — Z681 Body mass index (BMI) 19 or less, adult: Secondary | ICD-10-CM | POA: Diagnosis not present

## 2024-03-13 DIAGNOSIS — E860 Dehydration: Secondary | ICD-10-CM | POA: Diagnosis not present

## 2024-03-13 DIAGNOSIS — J690 Pneumonitis due to inhalation of food and vomit: Secondary | ICD-10-CM | POA: Diagnosis not present

## 2024-03-13 DIAGNOSIS — E7800 Pure hypercholesterolemia, unspecified: Secondary | ICD-10-CM | POA: Diagnosis not present

## 2024-03-13 DIAGNOSIS — Z0389 Encounter for observation for other suspected diseases and conditions ruled out: Secondary | ICD-10-CM | POA: Diagnosis not present

## 2024-03-13 DIAGNOSIS — Z66 Do not resuscitate: Secondary | ICD-10-CM | POA: Diagnosis not present

## 2024-03-13 DIAGNOSIS — N179 Acute kidney failure, unspecified: Secondary | ICD-10-CM | POA: Diagnosis not present

## 2024-03-13 DIAGNOSIS — I700 Atherosclerosis of aorta: Secondary | ICD-10-CM | POA: Diagnosis not present

## 2024-03-13 DIAGNOSIS — I081 Rheumatic disorders of both mitral and tricuspid valves: Secondary | ICD-10-CM | POA: Diagnosis not present

## 2024-03-13 DIAGNOSIS — Z1152 Encounter for screening for COVID-19: Secondary | ICD-10-CM | POA: Diagnosis not present

## 2024-03-13 DIAGNOSIS — I4891 Unspecified atrial fibrillation: Secondary | ICD-10-CM | POA: Diagnosis not present

## 2024-03-13 DIAGNOSIS — E440 Moderate protein-calorie malnutrition: Secondary | ICD-10-CM | POA: Diagnosis not present

## 2024-03-13 DIAGNOSIS — I272 Pulmonary hypertension, unspecified: Secondary | ICD-10-CM | POA: Diagnosis not present

## 2024-03-13 DIAGNOSIS — N289 Disorder of kidney and ureter, unspecified: Secondary | ICD-10-CM | POA: Diagnosis not present

## 2024-03-13 DIAGNOSIS — D751 Secondary polycythemia: Secondary | ICD-10-CM | POA: Diagnosis not present

## 2024-03-13 DIAGNOSIS — I509 Heart failure, unspecified: Secondary | ICD-10-CM | POA: Diagnosis not present

## 2024-03-19 ENCOUNTER — Telehealth: Payer: Self-pay | Admitting: Internal Medicine

## 2024-03-19 NOTE — Telephone Encounter (Signed)
 Copied from CRM [id number]. Topic: General - Deceased Patient >> 04-05-2024 12:57 PM Albertha Alosa wrote:  Patient son called in to inform Dr.Berglund that she passed away yesterday

## 2024-03-29 DEATH — deceased

## 2024-07-27 ENCOUNTER — Ambulatory Visit: Payer: Medicare Other | Admitting: Internal Medicine
